# Patient Record
Sex: Male | Born: 1940 | Race: White | Hispanic: No | Marital: Married | Smoking: Never smoker
Health system: Southern US, Community
[De-identification: ages and names within clinical notes are randomized; demographics above are authoritative.]

## PROBLEM LIST (undated history)

## (undated) DIAGNOSIS — K649 Unspecified hemorrhoids: Secondary | ICD-10-CM

## (undated) DIAGNOSIS — A770 Spotted fever due to Rickettsia rickettsii: Secondary | ICD-10-CM

## (undated) DIAGNOSIS — I639 Cerebral infarction, unspecified: Secondary | ICD-10-CM

## (undated) DIAGNOSIS — I10 Essential (primary) hypertension: Secondary | ICD-10-CM

## (undated) DIAGNOSIS — R0602 Shortness of breath: Secondary | ICD-10-CM

## (undated) DIAGNOSIS — E669 Obesity, unspecified: Secondary | ICD-10-CM

## (undated) DIAGNOSIS — I4891 Unspecified atrial fibrillation: Secondary | ICD-10-CM

## (undated) DIAGNOSIS — G4730 Sleep apnea, unspecified: Secondary | ICD-10-CM

## (undated) DIAGNOSIS — K219 Gastro-esophageal reflux disease without esophagitis: Secondary | ICD-10-CM

## (undated) DIAGNOSIS — K8020 Calculus of gallbladder without cholecystitis without obstruction: Secondary | ICD-10-CM

## (undated) DIAGNOSIS — E785 Hyperlipidemia, unspecified: Secondary | ICD-10-CM

## (undated) DIAGNOSIS — F329 Major depressive disorder, single episode, unspecified: Secondary | ICD-10-CM

## (undated) DIAGNOSIS — F32A Depression, unspecified: Secondary | ICD-10-CM

## (undated) HISTORY — DX: Unspecified hemorrhoids: K64.9

## (undated) HISTORY — PX: TONSILLECTOMY: SUR1361

## (undated) HISTORY — DX: Hyperlipidemia, unspecified: E78.5

## (undated) HISTORY — PX: COLONOSCOPY: SHX174

## (undated) HISTORY — PX: KNEE SURGERY: SHX244

---

## 2004-02-28 ENCOUNTER — Inpatient Hospital Stay (HOSPITAL_COMMUNITY): Admission: RE | Admit: 2004-02-28 | Discharge: 2004-03-02 | Payer: Self-pay | Admitting: Orthopedic Surgery

## 2005-05-02 ENCOUNTER — Inpatient Hospital Stay (HOSPITAL_COMMUNITY): Admission: RE | Admit: 2005-05-02 | Discharge: 2005-05-06 | Payer: Self-pay | Admitting: Orthopedic Surgery

## 2006-04-02 DIAGNOSIS — K649 Unspecified hemorrhoids: Secondary | ICD-10-CM

## 2006-04-02 HISTORY — DX: Unspecified hemorrhoids: K64.9

## 2006-11-08 ENCOUNTER — Ambulatory Visit (HOSPITAL_COMMUNITY): Admission: RE | Admit: 2006-11-08 | Discharge: 2006-11-08 | Payer: Self-pay | Admitting: *Deleted

## 2007-03-21 ENCOUNTER — Ambulatory Visit: Payer: Self-pay | Admitting: Vascular Surgery

## 2008-11-03 ENCOUNTER — Encounter: Payer: Self-pay | Admitting: Emergency Medicine

## 2008-11-04 ENCOUNTER — Inpatient Hospital Stay (HOSPITAL_COMMUNITY): Admission: RE | Admit: 2008-11-04 | Discharge: 2008-11-07 | Payer: Self-pay | Admitting: Internal Medicine

## 2008-11-04 ENCOUNTER — Ambulatory Visit: Payer: Self-pay | Admitting: Cardiology

## 2008-11-05 ENCOUNTER — Encounter (HOSPITAL_BASED_OUTPATIENT_CLINIC_OR_DEPARTMENT_OTHER): Payer: Self-pay | Admitting: Internal Medicine

## 2008-11-16 ENCOUNTER — Encounter (HOSPITAL_COMMUNITY): Admission: RE | Admit: 2008-11-16 | Discharge: 2008-12-16 | Payer: Self-pay | Admitting: Surgery

## 2008-12-21 ENCOUNTER — Encounter (HOSPITAL_COMMUNITY): Admission: RE | Admit: 2008-12-21 | Discharge: 2008-12-30 | Payer: Self-pay | Admitting: Surgery

## 2009-01-03 ENCOUNTER — Encounter (HOSPITAL_COMMUNITY): Admission: RE | Admit: 2009-01-03 | Discharge: 2009-02-02 | Payer: Self-pay | Admitting: Surgery

## 2010-04-14 ENCOUNTER — Ambulatory Visit (HOSPITAL_COMMUNITY)
Admission: RE | Admit: 2010-04-14 | Discharge: 2010-04-14 | Payer: Self-pay | Source: Home / Self Care | Attending: Endocrinology | Admitting: Endocrinology

## 2010-04-24 ENCOUNTER — Encounter (HOSPITAL_COMMUNITY)
Admission: RE | Admit: 2010-04-24 | Discharge: 2010-05-02 | Payer: Self-pay | Source: Home / Self Care | Attending: Urology | Admitting: Urology

## 2010-04-27 ENCOUNTER — Ambulatory Visit (HOSPITAL_COMMUNITY)
Admission: RE | Admit: 2010-04-27 | Discharge: 2010-04-27 | Payer: Self-pay | Source: Home / Self Care | Attending: Urology | Admitting: Urology

## 2010-05-04 ENCOUNTER — Encounter (HOSPITAL_COMMUNITY)
Admission: RE | Admit: 2010-05-04 | Discharge: 2010-05-04 | Disposition: A | Discharge status: Home / Self Care | Payer: Medicare Other | Source: Ambulatory Visit | Attending: Urology | Admitting: Urology

## 2010-05-04 DIAGNOSIS — I69922 Dysarthria following unspecified cerebrovascular disease: Secondary | ICD-10-CM | POA: Insufficient documentation

## 2010-05-04 DIAGNOSIS — IMO0001 Reserved for inherently not codable concepts without codable children: Secondary | ICD-10-CM | POA: Insufficient documentation

## 2010-05-09 ENCOUNTER — Ambulatory Visit (HOSPITAL_COMMUNITY)
Admission: RE | Admit: 2010-05-09 | Discharge: 2010-05-09 | Disposition: A | Discharge status: Home / Self Care | Payer: Medicare Other | Source: Ambulatory Visit | Attending: Urology | Admitting: Urology

## 2010-05-09 DIAGNOSIS — I69922 Dysarthria following unspecified cerebrovascular disease: Secondary | ICD-10-CM | POA: Insufficient documentation

## 2010-05-09 DIAGNOSIS — IMO0001 Reserved for inherently not codable concepts without codable children: Secondary | ICD-10-CM | POA: Insufficient documentation

## 2010-05-11 ENCOUNTER — Ambulatory Visit (HOSPITAL_COMMUNITY)
Admission: RE | Admit: 2010-05-11 | Discharge: 2010-05-11 | Disposition: A | Discharge status: Home / Self Care | Payer: Medicare Other | Source: Ambulatory Visit | Attending: Urology | Admitting: Urology

## 2010-05-11 DIAGNOSIS — I69922 Dysarthria following unspecified cerebrovascular disease: Secondary | ICD-10-CM | POA: Insufficient documentation

## 2010-05-11 DIAGNOSIS — IMO0001 Reserved for inherently not codable concepts without codable children: Secondary | ICD-10-CM | POA: Insufficient documentation

## 2010-05-16 ENCOUNTER — Ambulatory Visit (HOSPITAL_COMMUNITY)
Admission: RE | Admit: 2010-05-16 | Discharge: 2010-05-16 | Disposition: A | Discharge status: Home / Self Care | Payer: Medicare Other | Source: Ambulatory Visit | Attending: Urology | Admitting: Urology

## 2010-05-16 DIAGNOSIS — I69922 Dysarthria following unspecified cerebrovascular disease: Secondary | ICD-10-CM | POA: Insufficient documentation

## 2010-05-16 DIAGNOSIS — IMO0001 Reserved for inherently not codable concepts without codable children: Secondary | ICD-10-CM | POA: Insufficient documentation

## 2010-05-18 ENCOUNTER — Ambulatory Visit (HOSPITAL_COMMUNITY)
Admission: RE | Admit: 2010-05-18 | Discharge: 2010-05-18 | Disposition: A | Discharge status: Home / Self Care | Payer: Medicare Other | Source: Ambulatory Visit | Attending: *Deleted | Admitting: *Deleted

## 2010-06-20 ENCOUNTER — Emergency Department (HOSPITAL_COMMUNITY): Payer: Medicare Other

## 2010-06-20 ENCOUNTER — Emergency Department (HOSPITAL_COMMUNITY)
Admission: EM | Admit: 2010-06-20 | Discharge: 2010-06-20 | Disposition: A | Discharge status: Home / Self Care | Payer: Medicare Other | Attending: Emergency Medicine | Admitting: Emergency Medicine

## 2010-06-20 DIAGNOSIS — E7800 Pure hypercholesterolemia, unspecified: Secondary | ICD-10-CM | POA: Insufficient documentation

## 2010-06-20 DIAGNOSIS — E119 Type 2 diabetes mellitus without complications: Secondary | ICD-10-CM | POA: Insufficient documentation

## 2010-06-20 DIAGNOSIS — I10 Essential (primary) hypertension: Secondary | ICD-10-CM | POA: Insufficient documentation

## 2010-06-20 DIAGNOSIS — R5381 Other malaise: Secondary | ICD-10-CM | POA: Insufficient documentation

## 2010-06-20 DIAGNOSIS — E785 Hyperlipidemia, unspecified: Secondary | ICD-10-CM | POA: Insufficient documentation

## 2010-06-20 DIAGNOSIS — H539 Unspecified visual disturbance: Secondary | ICD-10-CM | POA: Insufficient documentation

## 2010-06-20 DIAGNOSIS — R4789 Other speech disturbances: Secondary | ICD-10-CM | POA: Insufficient documentation

## 2010-06-20 DIAGNOSIS — Z79899 Other long term (current) drug therapy: Secondary | ICD-10-CM | POA: Insufficient documentation

## 2010-06-20 DIAGNOSIS — R29898 Other symptoms and signs involving the musculoskeletal system: Secondary | ICD-10-CM | POA: Insufficient documentation

## 2010-06-20 DIAGNOSIS — Z794 Long term (current) use of insulin: Secondary | ICD-10-CM | POA: Insufficient documentation

## 2010-06-20 DIAGNOSIS — Z8673 Personal history of transient ischemic attack (TIA), and cerebral infarction without residual deficits: Secondary | ICD-10-CM | POA: Insufficient documentation

## 2010-06-20 LAB — BASIC METABOLIC PANEL
BUN: 15 mg/dL (ref 6–23)
CO2: 30 mEq/L (ref 19–32)
Calcium: 9.7 mg/dL (ref 8.4–10.5)
Chloride: 107 mEq/L (ref 96–112)
Creatinine, Ser: 1.03 mg/dL (ref 0.4–1.5)
GFR calc Af Amer: >60 mL/min (ref >60)
GFR calc non Af Amer: >60 mL/min (ref >60)
Glucose, Bld: 90 mg/dL (ref 70–99)
Potassium: 4.6 mEq/L (ref 3.5–5.1)
Sodium: 143 mEq/L (ref 135–145)

## 2010-06-20 LAB — DIFFERENTIAL
Basophils Absolute: 0.1 10*3/uL (ref 0.0–0.1)
Basophils Relative: 1 % (ref 0–1)
Eosinophils Absolute: 0.2 10*3/uL (ref 0.0–0.7)
Eosinophils Relative: 2 % (ref 0–5)
Lymphocytes Relative: 28 % (ref 12–46)
Lymphs Abs: 2.2 10*3/uL (ref 0.7–4.0)
Monocytes Absolute: 0.8 10*3/uL (ref 0.1–1.0)
Monocytes Relative: 10 % (ref 3–12)
Neutro Abs: 4.7 10*3/uL (ref 1.7–7.7)
Neutrophils Relative %: 59 % (ref 43–77)

## 2010-06-20 LAB — CBC
HCT: 44.2 % (ref 39.0–52.0)
Hemoglobin: 15.3 g/dL (ref 13.0–17.0)
MCH: 30.5 pg (ref 26.0–34.0)
MCHC: 34.6 g/dL (ref 30.0–36.0)
MCV: 88.2 fL (ref 78.0–100.0)
Platelets: 216 10*3/uL (ref 150–400)
RBC: 5.01 MIL/uL (ref 4.22–5.81)
RDW: 12.3 % (ref 11.5–15.5)
WBC: 7.9 10*3/uL (ref 4.0–10.5)

## 2010-07-08 LAB — GLUCOSE, CAPILLARY
Glucose-Capillary: 101 mg/dL — ABNORMAL HIGH (ref 70–99)
Glucose-Capillary: 108 mg/dL — ABNORMAL HIGH (ref 70–99)
Glucose-Capillary: 116 mg/dL — ABNORMAL HIGH (ref 70–99)
Glucose-Capillary: 123 mg/dL — ABNORMAL HIGH (ref 70–99)
Glucose-Capillary: 124 mg/dL — ABNORMAL HIGH (ref 70–99)
Glucose-Capillary: 132 mg/dL — ABNORMAL HIGH (ref 70–99)
Glucose-Capillary: 159 mg/dL — ABNORMAL HIGH (ref 70–99)
Glucose-Capillary: 164 mg/dL — ABNORMAL HIGH (ref 70–99)
Glucose-Capillary: 185 mg/dL — ABNORMAL HIGH (ref 70–99)
Glucose-Capillary: 192 mg/dL — ABNORMAL HIGH (ref 70–99)
Glucose-Capillary: 215 mg/dL — ABNORMAL HIGH (ref 70–99)
Glucose-Capillary: 223 mg/dL — ABNORMAL HIGH (ref 70–99)
Glucose-Capillary: 230 mg/dL — ABNORMAL HIGH (ref 70–99)
Glucose-Capillary: 328 mg/dL — ABNORMAL HIGH (ref 70–99)
Glucose-Capillary: 379 mg/dL — ABNORMAL HIGH (ref 70–99)
Glucose-Capillary: 56 mg/dL — ABNORMAL LOW (ref 70–99)
Glucose-Capillary: 61 mg/dL — ABNORMAL LOW (ref 70–99)
Glucose-Capillary: 63 mg/dL — ABNORMAL LOW (ref 70–99)

## 2010-07-08 LAB — CBC
HCT: 48.1 % (ref 39.0–52.0)
Hemoglobin: 16.3 g/dL (ref 13.0–17.0)
MCHC: 33.9 g/dL (ref 30.0–36.0)
MCV: 91.2 fL (ref 78.0–100.0)
Platelets: 201 10*3/uL (ref 150–400)
RBC: 5.28 MIL/uL (ref 4.22–5.81)
RDW: 13.4 % (ref 11.5–15.5)
WBC: 7.3 10*3/uL (ref 4.0–10.5)

## 2010-07-08 LAB — BASIC METABOLIC PANEL
BUN: 15 mg/dL (ref 6–23)
BUN: 16 mg/dL (ref 6–23)
CO2: 27 mEq/L (ref 19–32)
CO2: 28 mEq/L (ref 19–32)
Calcium: 9 mg/dL (ref 8.4–10.5)
Calcium: 9.2 mg/dL (ref 8.4–10.5)
Chloride: 105 mEq/L (ref 96–112)
Chloride: 106 mEq/L (ref 96–112)
Creatinine, Ser: 0.92 mg/dL (ref 0.4–1.5)
Creatinine, Ser: 0.96 mg/dL (ref 0.4–1.5)
GFR calc Af Amer: >60 mL/min (ref >60)
GFR calc Af Amer: >60 mL/min (ref >60)
GFR calc non Af Amer: >60 mL/min (ref >60)
GFR calc non Af Amer: >60 mL/min (ref >60)
Glucose, Bld: 166 mg/dL — ABNORMAL HIGH (ref 70–99)
Glucose, Bld: 189 mg/dL — ABNORMAL HIGH (ref 70–99)
Potassium: 3.4 mEq/L — ABNORMAL LOW (ref 3.5–5.1)
Potassium: 4.9 mEq/L (ref 3.5–5.1)
Sodium: 138 mEq/L (ref 135–145)
Sodium: 139 mEq/L (ref 135–145)

## 2010-07-08 LAB — URINALYSIS, ROUTINE W REFLEX MICROSCOPIC
Bilirubin Urine: NEGATIVE
Glucose, UA: >1000 mg/dL — AB
Hgb urine dipstick: NEGATIVE
Ketones, ur: NEGATIVE mg/dL
Leukocytes, UA: NEGATIVE
Nitrite: NEGATIVE
Protein, ur: NEGATIVE mg/dL
Specific Gravity, Urine: 1.01 (ref 1.005–1.030)
Urobilinogen, UA: 0.2 mg/dL (ref 0.0–1.0)
pH: 5.5 (ref 5.0–8.0)

## 2010-07-08 LAB — COMPREHENSIVE METABOLIC PANEL
ALT: 24 U/L (ref 0–53)
AST: 24 U/L (ref 0–37)
Albumin: 4 g/dL (ref 3.5–5.2)
Alkaline Phosphatase: 79 U/L (ref 39–117)
BUN: 21 mg/dL (ref 6–23)
CO2: 29 mEq/L (ref 19–32)
Calcium: 9.8 mg/dL (ref 8.4–10.5)
Chloride: 102 mEq/L (ref 96–112)
Creatinine, Ser: 1.21 mg/dL (ref 0.4–1.5)
GFR calc Af Amer: >60 mL/min (ref >60)
GFR calc non Af Amer: 60 mL/min — ABNORMAL LOW (ref >60)
Glucose, Bld: 416 mg/dL — ABNORMAL HIGH (ref 70–99)
Potassium: 4.3 mEq/L (ref 3.5–5.1)
Sodium: 136 mEq/L (ref 135–145)
Total Bilirubin: 0.5 mg/dL (ref 0.3–1.2)
Total Protein: 6.8 g/dL (ref 6.0–8.3)

## 2010-07-08 LAB — DIFFERENTIAL
Basophils Absolute: 0.1 10*3/uL (ref 0.0–0.1)
Basophils Relative: 1 % (ref 0–1)
Eosinophils Absolute: 0.2 10*3/uL (ref 0.0–0.7)
Eosinophils Relative: 3 % (ref 0–5)
Lymphocytes Relative: 31 % (ref 12–46)
Lymphs Abs: 2.3 10*3/uL (ref 0.7–4.0)
Monocytes Absolute: 0.9 10*3/uL (ref 0.1–1.0)
Monocytes Relative: 12 % (ref 3–12)
Neutro Abs: 3.9 10*3/uL (ref 1.7–7.7)
Neutrophils Relative %: 54 % (ref 43–77)

## 2010-07-08 LAB — POCT CARDIAC MARKERS
CKMB, poc: 1.6 ng/mL (ref 1.0–8.0)
CKMB, poc: 5.4 ng/mL (ref 1.0–8.0)
Myoglobin, poc: 101 ng/mL (ref 12–200)
Myoglobin, poc: 79.5 ng/mL (ref 12–200)
Troponin i, poc: <0.05 ng/mL (ref 0.00–0.09)
Troponin i, poc: <0.05 ng/mL (ref 0.00–0.09)

## 2010-07-08 LAB — URINE MICROSCOPIC-ADD ON

## 2010-07-08 LAB — PROTIME-INR
INR: 0.9 (ref 0.00–1.49)
Prothrombin Time: 11.8 seconds (ref 11.6–15.2)

## 2010-07-08 LAB — TROPONIN I: Troponin I: 0.03 ng/mL (ref 0.00–0.06)

## 2010-07-08 LAB — BRAIN NATRIURETIC PEPTIDE: Pro B Natriuretic peptide (BNP): 83 pg/mL (ref 0.0–100.0)

## 2010-08-15 NOTE — Discharge Summary (Signed)
NAMEONESIMO, LINGARD NO.:  [id number]   MEDICAL RECORD NO.:  [id number]          PATIENT TYPE:  INP   LOCATION:  3032                         FACILITY:  [facility]   PHYSICIAN:  Barry Dienes. Eloise Harman, M.D.DATE OF BIRTH:  October 05, 1940   DATE OF ADMISSION:  11/04/2008  DATE OF DISCHARGE:  11/07/2008                               DISCHARGE SUMMARY   PERTINENT FINDINGS:  The patient is a 70 year old white man who reported  to the emergency room at [facility] with a complaint of  weakness over the past 3 days with intermittent slurred speech.  His  symptoms were worse when he was out in the high heat doing yard work.  His emergency room workup was notable for a blood glucose level  initially greater than 400, so he was started on IV fluids.  At the time  of my initial evaluation, following transfer to [facility], he  felt better and was without shortness of breath, dizziness, or  confusion.  He was sitting up eating breakfast, had normal speech, and  was able to provide a good history.   PAST MEDICAL HISTORY:  Diabetes mellitus type 2, bilateral knee  osteoarthritis, chronic headaches, hyperlipidemia, hypertension, left  leg edema leading to December 2008 DVT ultrasound which was negative,  and exogenous obesity.  He also has a chronic cough.   MEDICATIONS PRIOR TO ADMISSION:  1. Tessalon 200 mg p.o. t.i.d.  2. Coreg 12.5 mg p.o. twice daily.  3. Zocor 80 mg p.o. daily.  4. Zetia 10 mg p.o. daily.  5. Lotensin 40 mg p.o. daily.  6. Diovan HCT 320/25 1 tablet p.o. q.a.m.  7. Trilipix 135 mg p.o. daily.  8. Insulin 75/25 36 units every a.m. and 46 units every p.m.  9. Aspirin 1 tablet daily.   ALLERGIES:  No known drug allergies.   For details of past surgical history, social history, family history,  and review of systems, see admission history and physical.   INITIAL PHYSICAL EXAM:  VITAL SIGNS:  Blood pressure 153/89, pulse 64,  respirations 18,  temperature 96.7, pulse oxygen saturation was 96% on  room air.  Most recent capillary blood glucose levels had been 328, 215,  and 185.  GENERAL:  He is a mildly overweight white man, who is in no apparent  distress.  He was a good historian, with normal speech.  HEAD, EYES,  EARS, NOSE AND THROAT:  Within normal limits.  NECK:  Supple, without jugular venous distention or carotid bruit.  CHEST: Clear to auscultation.  HEART:  Had a regular rate and rhythm without significant murmur.  ABDOMEN:  Had normal bowel sounds and no hepatosplenomegaly or  tenderness.  EXTREMITIES:  Without cyanosis, clubbing, or edema.  NEUROLOGIC:  He was alert and oriented x3, cranial nerves II-XII were  normal, sensory exam was grossly normal, motor strength was 5/5  throughout.  He had bilateral normal finger-to-nose testing.  Gait was  not assessed at that time.   INITIAL LABORATORY DATA:  White blood cell count 7.3, hemoglobin 16,  hematocrit 48, platelets 201,  serum sodium 136, potassium 4.3, chloride  102, carbon dioxide 29, BUN 21, creatinine 1.21, glucose 416, serum  calcium 9.8 with serum albumin 4.0, CK-MB was 5.4, troponin-I level was  less than 0.05.  A CT scan of the head without IV contrast showed no  acute disease.  A chest x-ray showed cardiomegaly with no acute  cardiopulmonary disease.  Urinalysis had specific gravity 1.010, nitrite  negative, and glucose greater than 1000.  EKG showed the following:  1. Normal sinus rhythm.  2. Left axis deviation.  3. Nonspecific ST-segment and T-wave abnormalities.   HOSPITAL COURSE:  The patient was admitted to a medical bed with  telemetry.  Initially had somewhat elevated blood pressures that  improved with adjusting medications.  He had a transthoracic  echocardiogram done that showed normal left ventricular systolic  function with aortic valve sclerosis and mild aortic insufficiency and  trace mitral regurgitation.  There were no vegetations or  sources for  intracardiac emboli.  On August 6, the physical therapist evaluated him  and felt that he had some disequilibrium and left upper strength  weakness.  Later that day he had developed a mild left facial droop with  mild past pointing on the left side.  An EKG at that time continued to  show normal sinus rhythm.  An MRI and MRA exam of the brain was obtained  as well as an MRA of the neck.  This showed a significant focus of  nonhemorrhagic infarct involving the posterior right lentiform nucleus  and posterior limb of the right internal capsule with extension into the  right corona radiata.  There was an additional punctate area of less  intense diffusion abnormality present in the right cerebellum that  likely represented a punctate subacute white matter infarct.  There was  extensive periventricular and subcortical white matter change  bilaterally.  The MRA showed normal flow in the major intracranial  artery.  The internal carotid arteries were within normal limits and  anterior communicating artery was not established.  There was  significant attenuation of MCA branch vessels bilaterally without  proximal occlusion.  The basilar and vertebral arteries showed no  stenosis.  His case was discussed with a neurologist who recommended  switching from aspirin to Plavix and continued efforts at  rehabilitation.   PROCEDURES:  Transthoracic echocardiogram, CT scan of the head without  contrast, and MRI exam of the brain without contrast and MRA exam of the  brain and neck.   COMPLICATIONS:  None.   CONDITION ON DISCHARGE:  He has no headache or nausea.  He has been  eating well without dysphagia.  Most recent physical exam shows vital  signs of blood pressure 166/81, pulse 60, respirations 18, temperature  97.8, pulse oxygen saturation was 95% on room air.  In general, he is a  mildly overweight white man who is in no apparent distress.  He had very  mild slurred speech, but  could easily be understood.  Head, eyes, ears,  nose and throat exam showed a very mild left-sided facial droop.  Chest  was clear to auscultation.  Heart had a regular rate and rhythm.  Neurological exam:  He was alert and well-oriented with a normal affect.  He had a mild left-sided facial droop and 4/5 left upper extremity  strength.  In addition, he had past pointing on the left side.  With  gait assessment, he was able to independently change from a sitting  position to standing position,  walk in the room, and returned to a  seated position.   DISCHARGE DIAGNOSES:  1. Right brain stroke as described above with some left upper      extremity weakness and mild left-sided facial droop.  2. Hypertension, essential, under fair control.  3. Diabetes mellitus, type 2, now under reasonable control.  4. Bilateral knee osteoarthritis leading to bilateral total knee      replacement.  5. Chronic headaches.  6. Chronic cough.  7. Hyperlipidemia.  8. Left leg edema without evidence of deep vein thrombosis.  9. Exogenous obesity.  10.Chronic rhinitis.   DISCHARGE MEDICATIONS:  1. Benzonatate 200 mg p.o. t.i.d. p.r.n. cough.  2. Carvedilol 12.5 mg p.o. twice daily.  3. Zetia 10 mg p.o. daily.  4. Simvastatin 80 mg p.o. daily.  5. Benazepril 40 mg p.o. daily.  6. Diovan HCT 320/25 1 tablet p.o. every a.m.  7. Humalog mix 75/25 insulin, take 20, 5 units every a.m. and 20 units      every p.m.  8. Trilipix 135 mg p.o. daily.  9. Plavix 75 mg p.o. daily.  10.Amlodipine 2.5 mg p.o. daily.  11.Nasal Atrovent 0.03% 1 to 2 sprays each nostril t.i.d. p.r.n.      congestion.   DISPOSITION AND FOLLOWUP:  He was advised to schedule a followup  appointment with Dr. [name] in approximately 1 to 2 weeks  following discharge and was given a telephone number to schedule that  appointment.  He was also advised to schedule outpatient stroke  rehabilitation at the [facility] [place] outpatient  rehab unit and was  given a prescription with orders to that effect.  He was advised that if  he has any difficulties with getting proper rehabilitation after stroke,  that he should call our office.   Please note that the process of discharge required 35 minutes.           ______________________________  Barry Dienes Eloise Harman, M.D.     DGP/MEDQ  D:  11/07/2008  T:  11/08/2008  Job:  [id number]

## 2010-08-15 NOTE — H&P (Signed)
NAMEBERND, CROM NO.:  [id number]   MEDICAL RECORD NO.:  [id number]          PATIENT TYPE:  INP   LOCATION:  3032                         FACILITY:  [facility]   PHYSICIAN:  Barry Dienes. Eloise Harman, M.D.DATE OF BIRTH:  [birth year]   DATE OF ADMISSION:  11/04/2008  DATE OF DISCHARGE:                              HISTORY & PHYSICAL   CHIEF COMPLAINT:  Weakness.   HISTORY OF PRESENT ILLNESS:  The patient is a 70 year old white man who  reported to the emergency room last night with weakness over the past 3  days with intermittent slurred speech.  In the emergency room at [facility], he was felt to be somewhat dehydrated and had moderately  elevated blood glucose level, so he was started on IV fluids.  Now after  IV fluids, he feels better.  He denies shortness of breath or dizziness  or confusion.  At the time of my evaluation he was sitting upright  eating breakfast and was able to provide a good history.   PAST MEDICAL HISTORY:  Diabetes mellitus type 2, which has been under  good control; bilateral knee osteoarthritis; chronic headaches;  hyperlipidemia; hypertension; left leg edema leading to December 2008  DVT ultrasound, which did not show a clot, and exogenous obesity.   MEDICATIONS PRIOR TO ADMISSION:  1. Tessalon 200 mg p.o. t.i.d.  2. Coreg 12.5 mg p.o. b.i.d.  3. Zocor 80 mg p.o. daily.  4. Zetia 10 mg p.o. daily.  5. Lotensin 40 mg p.o. daily.  6. Diovan and HCT 320/25 one tablet p.o. daily.  7. Trilipix 135 mg daily.  8. 75/25 insulin 36 units every a.m. and 46 units every p.m.  9. Aspirin 1 tablet daily.   ALLERGIES:  No known drug allergies.   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   SOCIAL HISTORY:  He is married and has two children.  He works at [employer]  He has  no history of tobacco or alcohol abuse.   FAMILY HISTORY:  His son also has diabetes mellitus.  There is no family  history of colon cancer or premature coronary artery disease.   REVIEW OF SYSTEMS:  He had some dizziness lately with working outside in  the high heat.  He complains of mild orthopnea without PND or chest pain  or pressure.   PHYSICAL EXAMINATION:  VITAL SIGNS:  Blood pressure 153/89, pulse 64,  respirations 18, temperature 96.7, 96% pulse oxygen saturation on room  air.  Capillary blood glucose levels were most recently 328, 215, and  185.  GENERAL:  He is a mildly overweight white man who is in no apparent  distress.  He was a good historian with normal speech.  HEAD, EYES, EARS, NOSE and THROAT:  Within normal limits.  NECK:  Supple and without jugular venous distention or carotid bruit.  CHEST:  Clear to auscultation.  HEART:  Regular rate and rhythm without significant murmur.  ABDOMEN:  Normal bowel sounds and no hepatosplenomegaly or tenderness.  EXTREMITIES:  Without cyanosis, clubbing, or edema.  NEUROLOGIC:  He is alert and oriented x3, cranial nerves II through XII  were normal, sensory exam was grossly normal, motor strength was 5/5  throughout.  He had bilateral normal finger-to-nose testing.  Gait was  not assessed at that time.   INITIAL LABORATORY DATA:  White blood cell count 7.3, hemoglobin 16,  hematocrit 48, platelets 201.  Serum sodium 136, potassium 4.3, chloride  102, carbon dioxide 29, BUN 21, creatinine 1.21, glucose 416, serum  calcium 9.8 with serum albumin 4.0, CK-MB was 5.4.  Troponin I was less  than 0.05.  A CT scan of the head without IV contrast showed no acute  disease.  Chest x-ray showed cardiomegaly with no acute cardiopulmonary  disease.  Urinalysis had glucose greater than 1000 with specific gravity  1.010 and nitrite negative.   EKG showed the following:  1. Normal sinus rhythm.  2. Left axis deviation.  3. Nonspecific  ST-segment and T-wave abnormalities.   IMPRESSION AND PLAN:  1. Altered mental status:  Given his initial hyperglycemia with      symptoms went out and high heat, it is quite likely that these were      due to dehydration.  Transient ischemic attack is also possible,      although currently his neurological exam was normal.  He does not      have a carotid bruit to suggest significant carotid artery disease.      We will give him IV fluids and reevaluate his neurological exam.      We will also request a physical therapy and occupational therapy      evaluation, as well as obtain an echocardiogram.  He will be      continued on aspirin at 325 mg once daily.  If he does show signs      of transient ischemic attacks, we will obtain a MRI scan and MRA,      as well as obtain a Neurology consultation.  2. Diabetes mellitus, type 2:  His initial blood glucose level was      quite high, and he admits that he missed his evening dose of      insulin.  We will start him on lower than his usual dose of 75/25      insulin with sliding scale insulin given only at noon time and      nightly as needed.  3. Hypertension:  His blood pressure systolic was mildly elevated.  If      his blood pressure continues to be significantly elevated, we will      consider adding Norvasc or increasing his dose of Coreg.           ______________________________  Barry Dienes. Eloise Harman, M.D.     DGP/MEDQ  D:  11/05/2008  T:  11/06/2008  Job:  [id number]

## 2010-08-15 NOTE — Op Note (Signed)
[name], [name] NO.:  [id number]   MEDICAL RECORD NO.:  [id number]          PATIENT TYPE:  AMB   LOCATION:  ENDO                         [facility]AC[facility]:  [facility]   PHYSICIAN:  Georgiana Spinner, M.D.    DATE OF BIRTH:  13-Jun-1940   DATE OF PROCEDURE:  11/08/2006  DATE OF DISCHARGE:                               OPERATIVE REPORT   PROCEDURE:  Colonoscopy.   INDICATIONS:  Colon cancer screening, rectal bleeding.   ANESTHESIA:  Fentanyl 70 mcg, Versed 8 mg.   PROCEDURE:  With the patient mildly sedated in the left lateral  decubitus position, a rectal examination was attempted.  The prostate  could not be well felt.  Subsequently, the Pentax videoscopic  colonoscope was inserted into the rectum and passed under direct vision  to the cecum, identified by ileocecal valve and appendiceal orifice,  both of which were photographed.  From this point, the colonoscope was  slowly withdrawn taking circumferential views of colonic mucosa stopping  only in the rectum which appeared normal on direct and showed  hemorrhoids on retroflexed view.  The endoscope was straightened,  withdrawn.  The patient's vital signs and pulse oximeter remained  stable.  The patient tolerated the procedure well without apparent  complications.   FINDINGS:  Internal hemorrhoids.  Otherwise unremarkable exam.   PLAN:  Consider repeat examination in 5-10 years.  At 5 years, would  suggest annual Hemoccults and colonoscopy at that point if they are  positive.           ______________________________  Georgiana Spinner, M.D.     GMO/MEDQ  D:  11/08/2006  T:  11/08/2006  Job:  [id number]   cc:   Jeannett Senior A. Evlyn Kanner, M.D.  Fax: [phone]

## 2010-08-15 NOTE — Procedures (Signed)
DUPLEX DEEP VENOUS EXAM - LOWER EXTREMITY   INDICATION:  Left calf pain and swelling.   HISTORY:  Edema:  Yes.  Trauma/Surgery:  No.  Pain:  Yes.  PE:  No.  Previous DVT:  No.  Anticoagulants:  Other:   DUPLEX EXAM:                CFV   SFV   PopV  PTV    GSV                R  L  R  L  R  L  R   L  R  L  Thrombosis    0  0     0     0      0     0  Spontaneous   +  +     +     +      +     +  Phasic        +  +     +     +      +     +  Augmentation  +  +     +     +      +     +  Compressible  +  +     +     +      +     +  Competent     +  +     +     +      +     +   Legend:  + - yes  o - no  p - partial  D - decreased   IMPRESSION:  No significant of deep venous thrombosis noted in the left  leg.   _____________________________  Quita Skye Hart Rochester, M.D.   MG/MEDQ  D:  03/21/2007  T:  03/23/2007  Job:  [id number]

## 2011-05-14 ENCOUNTER — Encounter (HOSPITAL_COMMUNITY): Payer: Self-pay

## 2011-05-14 ENCOUNTER — Other Ambulatory Visit: Payer: Self-pay

## 2011-05-14 ENCOUNTER — Emergency Department (HOSPITAL_COMMUNITY): Payer: Medicare Other

## 2011-05-14 ENCOUNTER — Inpatient Hospital Stay (HOSPITAL_COMMUNITY)
Admission: EM | Admit: 2011-05-14 | Discharge: 2011-05-16 | DRG: 310 | Disposition: A | Discharge status: Home / Self Care | Payer: Medicare Other | Attending: Internal Medicine | Admitting: Internal Medicine

## 2011-05-14 DIAGNOSIS — E785 Hyperlipidemia, unspecified: Secondary | ICD-10-CM

## 2011-05-14 DIAGNOSIS — I4510 Unspecified right bundle-branch block: Secondary | ICD-10-CM | POA: Diagnosis present

## 2011-05-14 DIAGNOSIS — I4819 Other persistent atrial fibrillation: Secondary | ICD-10-CM | POA: Diagnosis present

## 2011-05-14 DIAGNOSIS — Z79899 Other long term (current) drug therapy: Secondary | ICD-10-CM

## 2011-05-14 DIAGNOSIS — Z8673 Personal history of transient ischemic attack (TIA), and cerebral infarction without residual deficits: Secondary | ICD-10-CM

## 2011-05-14 DIAGNOSIS — I4891 Unspecified atrial fibrillation: Principal | ICD-10-CM

## 2011-05-14 DIAGNOSIS — I4892 Unspecified atrial flutter: Secondary | ICD-10-CM | POA: Diagnosis present

## 2011-05-14 DIAGNOSIS — E669 Obesity, unspecified: Secondary | ICD-10-CM

## 2011-05-14 DIAGNOSIS — I2789 Other specified pulmonary heart diseases: Secondary | ICD-10-CM | POA: Diagnosis present

## 2011-05-14 DIAGNOSIS — I10 Essential (primary) hypertension: Secondary | ICD-10-CM

## 2011-05-14 DIAGNOSIS — E119 Type 2 diabetes mellitus without complications: Secondary | ICD-10-CM

## 2011-05-14 DIAGNOSIS — Z7901 Long term (current) use of anticoagulants: Secondary | ICD-10-CM

## 2011-05-14 DIAGNOSIS — Z794 Long term (current) use of insulin: Secondary | ICD-10-CM

## 2011-05-14 DIAGNOSIS — Z7902 Long term (current) use of antithrombotics/antiplatelets: Secondary | ICD-10-CM

## 2011-05-14 HISTORY — DX: Cerebral infarction, unspecified: I63.9

## 2011-05-14 HISTORY — DX: Essential (primary) hypertension: I10

## 2011-05-14 LAB — CBC
HCT: 46.5 % (ref 39.0–52.0)
Hemoglobin: 15.9 g/dL (ref 13.0–17.0)
MCH: 30.9 pg (ref 26.0–34.0)
MCHC: 34.2 g/dL (ref 30.0–36.0)
MCV: 90.3 fL (ref 78.0–100.0)
Platelets: 215 10*3/uL (ref 150–400)
RBC: 5.15 MIL/uL (ref 4.22–5.81)
RDW: 12.6 % (ref 11.5–15.5)
WBC: 8.4 10*3/uL (ref 4.0–10.5)

## 2011-05-14 LAB — PROTIME-INR
INR: 0.94 (ref 0.00–1.49)
Prothrombin Time: 12.8 seconds (ref 11.6–15.2)

## 2011-05-14 LAB — COMPREHENSIVE METABOLIC PANEL
ALT: 32 U/L (ref 0–53)
AST: 33 U/L (ref 0–37)
Albumin: 4 g/dL (ref 3.5–5.2)
Alkaline Phosphatase: 88 U/L (ref 39–117)
BUN: 18 mg/dL (ref 6–23)
CO2: 26 mEq/L (ref 19–32)
Calcium: 10 mg/dL (ref 8.4–10.5)
Chloride: 103 mEq/L (ref 96–112)
Creatinine, Ser: 0.93 mg/dL (ref 0.50–1.35)
GFR calc Af Amer: >90 mL/min (ref >90)
GFR calc non Af Amer: 84 mL/min — ABNORMAL LOW (ref >90)
Glucose, Bld: 182 mg/dL — ABNORMAL HIGH (ref 70–99)
Potassium: 3.8 mEq/L (ref 3.5–5.1)
Sodium: 139 mEq/L (ref 135–145)
Total Bilirubin: 0.4 mg/dL (ref 0.3–1.2)
Total Protein: 7 g/dL (ref 6.0–8.3)

## 2011-05-14 LAB — DIFFERENTIAL
Basophils Absolute: 0.1 10*3/uL (ref 0.0–0.1)
Basophils Relative: 1 % (ref 0–1)
Eosinophils Absolute: 0.2 10*3/uL (ref 0.0–0.7)
Eosinophils Relative: 3 % (ref 0–5)
Lymphocytes Relative: 28 % (ref 12–46)
Lymphs Abs: 2.4 10*3/uL (ref 0.7–4.0)
Monocytes Absolute: 1 10*3/uL (ref 0.1–1.0)
Monocytes Relative: 12 % (ref 3–12)
Neutro Abs: 4.8 10*3/uL (ref 1.7–7.7)
Neutrophils Relative %: 56 % (ref 43–77)

## 2011-05-14 LAB — TROPONIN I: Troponin I: <0.3 ng/mL (ref <0.30)

## 2011-05-14 LAB — APTT: aPTT: 36 seconds (ref 24–37)

## 2011-05-14 MED ORDER — ENOXAPARIN SODIUM 100 MG/ML ~~LOC~~ SOLN
100.0000 mg | Freq: Once | SUBCUTANEOUS | Status: AC
  Administered 2011-05-14: 100 mg via SUBCUTANEOUS
  Filled 2011-05-14: qty 1

## 2011-05-14 NOTE — H&P (Signed)
PCP:  Julian Hy, MD, MD Chief Complaint:  "I was asked to come to the hospital by my PCP because my heart is said to be irregular"  HPI:  Patient is a 71 year old Caucasian male with history of hypertension, diabetes mellitus, prior CVA with no neuro deficit presenting to the emergency room on the advice of his primary care physician. Patient was visited at home today by the home health care nurse and on general examination was found to have irregular heartbeat and elevated blood pressure. Patient denied any chest pain. He denied any palpitation. No shortness of breath. No systemic symptoms. He was advised to come to the hospital on the advice of his primary care physician for further evaluation  Review of Systems: Denies any complaints.  The patient denies anorexia, fever, weight loss,, vision loss, decreased hearing, hoarseness, chest pain, syncope, dyspnea on exertion, peripheral edema, balance deficits, hemoptysis, abdominal pain, melena, hematochezia, severe indigestion/heartburn, hematuria, incontinence, genital sores, muscle weakness, suspicious skin lesions, transient blindness, difficulty walking, depression, unusual weight change, abnormal bleeding, enlarged lymph nodes, angioedema, and breast masses.  Past Medical History:  Past Medical History  Diagnosis Date  . Hypertension   . Diabetes mellitus   . CVA (cerebral infarction)     Past Surgical History  Procedure Date  . Knee surgery     Medications:  Prior to Admission medications   Medication Sig Start Date End Date Taking? Authorizing Provider  amLODipine (NORVASC) 2.5 MG tablet Take 2.5 mg by mouth daily.   Yes Historical Provider, MD  benazepril (LOTENSIN) 10 MG tablet Take 10 mg by mouth daily.   Yes Historical Provider, MD  carvedilol (COREG) 12.5 MG tablet Take 12.5 mg by mouth 2 (two) times daily with a meal.   Yes Historical Provider, MD  Choline Fenofibrate (TRILIPIX) 135 MG capsule Take 135 mg by  mouth daily.   Yes Historical Provider, MD  clopidogrel (PLAVIX) 75 MG tablet Take 75 mg by mouth daily.   Yes Historical Provider, MD  escitalopram (LEXAPRO) 10 MG tablet Take 10 mg by mouth daily.   Yes Historical Provider, MD  ezetimibe (ZETIA) 10 MG tablet Take 10 mg by mouth daily.   Yes Historical Provider, MD  insulin glargine (LANTUS SOLOSTAR) 100 UNIT/ML injection Inject 32 Units into the skin at bedtime.   Yes Historical Provider, MD  insulin lispro (HUMALOG KWIKPEN) 100 UNIT/ML injection Inject into the skin 3 (three) times daily before meals. **Inject 14 units every morning, 17 units every day at lunch, and 20 units daily with dinner**   Yes Historical Provider, MD  polycarbophil (FIBERCON) 625 MG tablet Take 625 mg by mouth daily.   Yes Historical Provider, MD  simvastatin (ZOCOR) 80 MG tablet Take 80 mg by mouth daily.   Yes Historical Provider, MD  valsartan-hydrochlorothiazide (DIOVAN-HCT) 320-25 MG per tablet Take 1 tablet by mouth daily.   Yes Historical Provider, MD    Allergies:  No Known Allergies  Social History:   reports that he has never smoked. He does not have any smokeless tobacco history on file. He reports that he drinks alcohol. He reports that he does not use illicit drugs.  Family History:  No family history on file.  Physical Exam:  Filed Vitals:   05/14/11 1654 05/14/11 1802  BP: 169/105 157/111  Pulse: 73 84  Temp: 97.6 F (36.4 C)   Resp: 18   Height: 5\' 8"  (1.727 m)   Weight: 104.327 kg (230 lb)   SpO2: 100% 96%  General: Obese, Alert and oriented times three, well developed and nourished, no acute distress  Eyes: PERRLA, pink conjunctiva, scleral icterus  ENT: Moist oral mucosa, neck supple, no thyromegaly  Lungs: clear to ascultation, no wheeze, no crackles, no use of accessory muscles  Cardiovascular: irregular rate and rhythm, no regurgitation, no gallops, no murmurs. No carotid bruits, no JVD  Abdomen: soft, positive BS,  non-tender, non-distended, no organomegaly, not an acute abdomen  GU: not examined  Neuro: No lateralizing signs  Musculoskeletal: strength 5/5 all extremities, no clubbing, cyanosis or edema  Skin: Normal turgor  Psych: appropriate affect  ?  Labs on Admission:  No results found for this basename: NA:2,K:2,CL:2,CO2:2,GLUCOSE:2,BUN:2,CREATININE:2,CALCIUM:2,MG:2,PHOS:2 in the last 72 hours  No results found for this basename: AST:2,ALT:2,ALKPHOS:2,BILITOT:2,PROT:2,ALBUMIN:2 in the last 72 hours  No results found for this basename: LIPASE:2,AMYLASE:2 in the last 72 hours   Basename 05/14/11 1930  WBC 8.4  NEUTROABS 4.8  HGB 15.9  HCT 46.5  MCV 90.3  PLT 215    No results found for this basename: CKTOTAL:3,CKMB:3,CKMBINDEX:3,TROPONINI:3 in the last 72 hours  No results found for this basename: TSH,T4TOTAL,FREET3,T3FREE,THYROIDAB in the last 72 hours  No results found for this basename: VITAMINB12:2,FOLATE:2,FERRITIN:2,TIBC:2,IRON:2,RETICCTPCT:2 in the last 72 hours  Radiological Exams on Admission:  Dg Chest 2 View  05/14/2011  *RADIOLOGY REPORT*  Clinical Data: Atrial fibrillation.  CHEST - 2 VIEW  Comparison: Two-view chest 11/03/2008.  Findings: Mild cardiomegaly is stable.  The lung volumes are low. Bibasilar atelectasis is evident.  The visualized soft tissues and bony thorax are unremarkable.  IMPRESSION:  1.  Stable cardiomegaly without failure. 2.  Low lung volumes and mild bibasilar atelectasis.  Original Report Authenticated By: Jamesetta Orleans. MATTERN, M.D.    Assessment/Plan  Present on Admission:   Problems: #1 asymptomatic atrial fibrillation #2 elevated blood pressure #3 obesity  Impression: #1 asymptomatic atrial fibrillation #2 elevated blood pressure #3 prior CVA-no neuro deficit #4 diabetes mellitus #5 hyperlipidemia  Plan: #1 admit patient to telemetry #2 restart home meds. However the dose of norvasc will be increased to 10 mg daily for  adequate blood pressure control. #3 labs; TSH, T3, T4, magnesium level, CBC CMP  will be repeated in a.m. #4 imaging-2-D echo #5 GI prophylaxis with by mouth Protonix and DVT prophylaxis with Lovenox Patient will be evaluated on daily basis.            Talmage Nap                  [phone]

## 2011-05-14 NOTE — ED Notes (Signed)
Pt had Nurse from [employer] out today to evaluate pt and found to have ? Irregular heart beat. Pt has had episodes of "feeling tired'

## 2011-05-14 NOTE — ED Provider Notes (Signed)
History    This chart was scribed for [employer]. Colon Branch, MD, MD by Smitty Pluck. The patient was seen in room APA08 and the patient's care was started at 6:43PM.   CSN: [id number]  Arrival date & time 05/14/11  1648   First MD Initiated Contact with Patient 05/14/11 1757      Chief Complaint  Patient presents with  . Irregular Heart Beat    (Consider location/radiation/quality/duration/timing/severity/associated sxs/prior treatment) The history is provided by the patient and the spouse.   [name] is a 71 y.o. male who presents to the Emergency Department complaining of irregular heart beat onset today. Nurse from [facility] came out today to evaluate pt and found that he had an irregular heart beat. She called PCP Dr. Evlyn Kanner and the pt was instructed to come to ED. He has never has been diagnosed with this condition before. 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 Pt reports having a diabetes. He reports that his glucoses levels have gone down and his blood pressure has done down resulting in the PCP taking him off of the some of his medications. Pt takes Plavix.  Neurologist is Dr. Hollice Espy in [facility]. Last time he saw him was a month ago and was told he was doing good.   Past Medical History  Diagnosis Date  . Hypertension   . Diabetes mellitus   . CVA (cerebral infarction)     Past Surgical History  Procedure Date  . Knee surgery     No family history on file.  History  Substance Use Topics  . Smoking status: Never Smoker   . Smokeless tobacco: Not on file  . Alcohol Use: Yes      Review of Systems  All other systems reviewed and are negative.   10 Systems reviewed and are negative for acute change except as noted in the HPI.  Allergies  Review of patient's allergies indicates no known allergies.  Home Medications   Current Outpatient Rx    Name Route Sig Dispense Refill  . AMLODIPINE BESYLATE 2.5 MG PO TABS Oral Take 2.5 mg by mouth daily.    Marland Kitchen BENAZEPRIL HCL 10 MG PO TABS Oral Take 10 mg by mouth daily.    Marland Kitchen CARVEDILOL 12.5 MG PO TABS Oral Take 12.5 mg by mouth 2 (two) times daily with a meal.    . CHOLINE FENOFIBRATE 135 MG PO CPDR Oral Take 135 mg by mouth daily.    Marland Kitchen CLOPIDOGREL BISULFATE 75 MG PO TABS Oral Take 75 mg by mouth daily.    Marland Kitchen ESCITALOPRAM OXALATE 10 MG PO TABS Oral Take 10 mg by mouth daily.    Marland Kitchen EZETIMIBE 10 MG PO TABS Oral Take 10 mg by mouth daily.    . INSULIN GLARGINE 100 UNIT/ML [place] SOLN Subcutaneous Inject 32 Units into the skin at bedtime.    . INSULIN LISPRO (HUMAN) 100 UNIT/ML [place] SOLN Subcutaneous Inject into the skin 3 (three) times daily before meals. **Inject 14 units every morning, 17 units every day at lunch, and 20 units daily with dinner**    . CALCIUM POLYCARBOPHIL 625 MG PO TABS Oral Take 625 mg by mouth daily.    Marland Kitchen SIMVASTATIN 80 MG PO TABS Oral Take 80 mg by mouth daily.    Marland Kitchen VALSARTAN-HYDROCHLOROTHIAZIDE 320-25 MG PO TABS Oral Take 1 tablet by mouth daily.  BP 157/111  Pulse 84  Temp 97.6 F (36.4 C)  Resp 18  Ht 5\' 8"  (1.727 m)  Wt 230 lb (104.327 kg)  BMI 34.97 kg/m2  SpO2 96%  Physical Exam  Nursing note and vitals reviewed. Constitutional: He is oriented to person, place, and time. He appears well-developed and well-nourished. No distress.  HENT:  Head: Normocephalic and atraumatic.  Cardiovascular: An irregularly irregular rhythm present.  Pulmonary/Chest: Effort normal and breath sounds normal. No respiratory distress.  Abdominal: Bowel sounds are normal. He exhibits no distension.       obese  Musculoskeletal: He exhibits edema (1 on lower extremities).       Right hand dominant   Neurological: He is alert and oriented to person, place, and time.       Carefully articulated speech due to previous stroke And mild left side weakness Right hand dominant   Skin:  Skin is warm and dry.  Psychiatric: He has a normal mood and affect. His behavior is normal.    ED Course  Procedures (including critical care time) DIAGNOSTIC STUDIES: Oxygen Saturation is 100% on room air, normal by my interpretation.    COORDINATION OF CARE: 6:47PM EDP discusses pt ED treatment course with pt  7:05 PM Spoke with Dr. Beverly Gust, hospitalist. He recommended admission. Will call him back once labs and xray are back. 8:14 Dr. Beverly Gust in the ER to see/admit patient.   Dg Chest 2 View  05/14/2011  *RADIOLOGY REPORT*  Clinical Data: Atrial fibrillation.  CHEST - 2 VIEW  Comparison: Two-view chest 11/03/2008.  Findings: Mild cardiomegaly is stable.  The lung volumes are low. Bibasilar atelectasis is evident.  The visualized soft tissues and bony thorax are unremarkable.  IMPRESSION:  1.  Stable cardiomegaly without failure. 2.  Low lung volumes and mild bibasilar atelectasis.  Original Report Authenticated By: Jamesetta Orleans. MATTERN, M.D.   Results for orders placed during the hospital encounter of 05/14/11  CBC      Component Value Range   WBC 8.4  4.0 - 10.5 (K/uL)   RBC 5.15  4.22 - 5.81 (MIL/uL)   Hemoglobin 15.9  13.0 - 17.0 (g/dL)   HCT 16.1  09.6 - 04.5 (%)   MCV 90.3  78.0 - 100.0 (fL)   MCH 30.9  26.0 - 34.0 (pg)   MCHC 34.2  30.0 - 36.0 (g/dL)   RDW 40.9  81.1 - 91.4 (%)   Platelets 215  150 - 400 (K/uL)  DIFFERENTIAL      Component Value Range   Neutrophils Relative 56  43 - 77 (%)   Neutro Abs 4.8  1.7 - 7.7 (K/uL)   Lymphocytes Relative 28  12 - 46 (%)   Lymphs Abs 2.4  0.7 - 4.0 (K/uL)   Monocytes Relative 12  3 - 12 (%)   Monocytes Absolute 1.0  0.1 - 1.0 (K/uL)   Eosinophils Relative 3  0 - 5 (%)   Eosinophils Absolute 0.2  0.0 - 0.7 (K/uL)   Basophils Relative 1  0 - 1 (%)   Basophils Absolute 0.1  0.0 - 0.1 (K/uL)  COMPREHENSIVE METABOLIC PANEL      Component Value Range   Sodium 139  135 - 145 (mEq/L)   Potassium 3.8  3.5 - 5.1 (mEq/L)    Chloride 103  96 - 112 (mEq/L)   CO2 26  19 - 32 (mEq/L)   Glucose, Bld 182 (*) 70 - 99 (mg/dL)   BUN 18  6 -  23 (mg/dL)   Creatinine, Ser 7.82  0.50 - 1.35 (mg/dL)   Calcium 95.6  8.4 - 10.5 (mg/dL)   Total Protein 7.0  6.0 - 8.3 (g/dL)   Albumin 4.0  3.5 - 5.2 (g/dL)   AST 33  0 - 37 (U/L)   ALT 32  0 - 53 (U/L)   Alkaline Phosphatase 88  39 - 117 (U/L)   Total Bilirubin 0.4  0.3 - 1.2 (mg/dL)   GFR calc non Af Amer 84 (*) >90 (mL/min)   GFR calc Af Amer >90  >90 (mL/min)  TROPONIN I      Component Value Range   Troponin I <0.30  <0.30 (ng/mL)     Date: 05/14/2011  1657  Rate: 105  Rhythm: atrial fibrillation and with a rapid ventricular response  QRS Axis: left  Intervals: atrial fib  ST/T Wave abnormalities: nonspecific T wave changes  Conduction Disutrbances:incomplete RBBB  Narrative Interpretation:   Old EKG Reviewed: changes notedc/w 06/20/10, atrial fibrillation is new, IRBBB is new    MDM  Patient with h/o, htn,  diabetes, stroke x 2 here with new onset atrial fibrillation which is asymptomatic. Discussed with hospitalist. He will admit patient for further work up. Patient has received lovenox. Labs are unremarkable as is the chest xray. EKG with atrial fibrillation. Pt stable in ED with no significant deterioration in condition.The patient appears reasonably stabilized for admission considering the current resources, flow, and capabilities available in the ED at this time, and I doubt any other [facility] requiring further screening and/or treatment in the ED prior to admission.  I personally performed the services described in this documentation, which was scribed in my presence. The recorded information has been reviewed and considered.  MDM Reviewed: nursing note and vitals Interpretation: labs, ECG and x-ray Total time providing critical care: 40. Consults: hospitalist.        Nicoletta Dress. Colon Branch, MD 05/14/11 2056

## 2011-05-15 ENCOUNTER — Encounter (HOSPITAL_COMMUNITY): Payer: Self-pay | Admitting: General Practice

## 2011-05-15 DIAGNOSIS — I10 Essential (primary) hypertension: Secondary | ICD-10-CM | POA: Diagnosis present

## 2011-05-15 DIAGNOSIS — I359 Nonrheumatic aortic valve disorder, unspecified: Secondary | ICD-10-CM

## 2011-05-15 DIAGNOSIS — E785 Hyperlipidemia, unspecified: Secondary | ICD-10-CM | POA: Diagnosis present

## 2011-05-15 DIAGNOSIS — E119 Type 2 diabetes mellitus without complications: Secondary | ICD-10-CM | POA: Diagnosis present

## 2011-05-15 DIAGNOSIS — I4891 Unspecified atrial fibrillation: Principal | ICD-10-CM | POA: Diagnosis present

## 2011-05-15 DIAGNOSIS — Z8673 Personal history of transient ischemic attack (TIA), and cerebral infarction without residual deficits: Secondary | ICD-10-CM

## 2011-05-15 DIAGNOSIS — E669 Obesity, unspecified: Secondary | ICD-10-CM | POA: Diagnosis present

## 2011-05-15 DIAGNOSIS — I4819 Other persistent atrial fibrillation: Secondary | ICD-10-CM | POA: Diagnosis present

## 2011-05-15 LAB — MAGNESIUM: Magnesium: 1.8 mg/dL (ref 1.5–2.5)

## 2011-05-15 LAB — DIFFERENTIAL
Basophils Absolute: 0.1 10*3/uL (ref 0.0–0.1)
Basophils Relative: 1 % (ref 0–1)
Eosinophils Absolute: 0.3 10*3/uL (ref 0.0–0.7)
Eosinophils Relative: 3 % (ref 0–5)
Lymphocytes Relative: 33 % (ref 12–46)
Lymphs Abs: 2.8 10*3/uL (ref 0.7–4.0)
Monocytes Absolute: 1 10*3/uL (ref 0.1–1.0)
Monocytes Relative: 12 % (ref 3–12)
Neutro Abs: 4.3 10*3/uL (ref 1.7–7.7)
Neutrophils Relative %: 51 % (ref 43–77)

## 2011-05-15 LAB — CBC
HCT: 45.5 % (ref 39.0–52.0)
Hemoglobin: 15.3 g/dL (ref 13.0–17.0)
MCH: 29.9 pg (ref 26.0–34.0)
MCHC: 33.6 g/dL (ref 30.0–36.0)
MCV: 89 fL (ref 78.0–100.0)
Platelets: 199 10*3/uL (ref 150–400)
RBC: 5.11 MIL/uL (ref 4.22–5.81)
RDW: 12.5 % (ref 11.5–15.5)
WBC: 8.4 10*3/uL (ref 4.0–10.5)

## 2011-05-15 LAB — GLUCOSE, CAPILLARY
Glucose-Capillary: 262 mg/dL — ABNORMAL HIGH (ref 70–99)
Glucose-Capillary: 149 mg/dL — ABNORMAL HIGH (ref 70–99)
Glucose-Capillary: 124 mg/dL — ABNORMAL HIGH (ref 70–99)

## 2011-05-15 LAB — COMPREHENSIVE METABOLIC PANEL
ALT: 26 U/L (ref 0–53)
AST: 22 U/L (ref 0–37)
Albumin: 3.6 g/dL (ref 3.5–5.2)
Alkaline Phosphatase: 77 U/L (ref 39–117)
BUN: 14 mg/dL (ref 6–23)
CO2: 25 mEq/L (ref 19–32)
Calcium: 9.6 mg/dL (ref 8.4–10.5)
Chloride: 106 mEq/L (ref 96–112)
Creatinine, Ser: 0.89 mg/dL (ref 0.50–1.35)
GFR calc Af Amer: >90 mL/min (ref >90)
GFR calc non Af Amer: 85 mL/min — ABNORMAL LOW (ref >90)
Glucose, Bld: 106 mg/dL — ABNORMAL HIGH (ref 70–99)
Potassium: 3.3 mEq/L — ABNORMAL LOW (ref 3.5–5.1)
Sodium: 142 mEq/L (ref 135–145)
Total Bilirubin: 0.7 mg/dL (ref 0.3–1.2)
Total Protein: 6.3 g/dL (ref 6.0–8.3)

## 2011-05-15 LAB — T4, FREE: Free T4: 1.16 ng/dL (ref 0.80–1.80)

## 2011-05-15 LAB — T3, FREE: T3, Free: 3.1 pg/mL (ref 2.3–4.2)

## 2011-05-15 LAB — TSH: TSH: 1.563 u[IU]/mL (ref 0.350–4.500)

## 2011-05-15 MED ORDER — FENOFIBRATE 54 MG PO TABS
54.0000 mg | ORAL_TABLET | Freq: Every day | ORAL | Status: DC
  Administered 2011-05-15 – 2011-05-16 (×2): 54 mg via ORAL
  Filled 2011-05-15 (×4): qty 1

## 2011-05-15 MED ORDER — POTASSIUM CHLORIDE CRYS ER 20 MEQ PO TBCR
40.0000 meq | EXTENDED_RELEASE_TABLET | Freq: Once | ORAL | Status: AC
  Administered 2011-05-15: 40 meq via ORAL
  Filled 2011-05-15: qty 2

## 2011-05-15 MED ORDER — INSULIN ASPART 100 UNIT/ML ~~LOC~~ SOLN
20.0000 [IU] | Freq: Every day | SUBCUTANEOUS | Status: DC
  Administered 2011-05-15: 20 [IU] via SUBCUTANEOUS

## 2011-05-15 MED ORDER — PANTOPRAZOLE SODIUM 40 MG PO TBEC
40.0000 mg | DELAYED_RELEASE_TABLET | Freq: Every day | ORAL | Status: DC
  Administered 2011-05-15: 40 mg via ORAL
  Filled 2011-05-15: qty 1

## 2011-05-15 MED ORDER — CLOPIDOGREL BISULFATE 75 MG PO TABS
75.0000 mg | ORAL_TABLET | Freq: Every day | ORAL | Status: DC
  Administered 2011-05-15: 75 mg via ORAL
  Filled 2011-05-15: qty 1

## 2011-05-15 MED ORDER — OLMESARTAN MEDOXOMIL 20 MG PO TABS
40.0000 mg | ORAL_TABLET | Freq: Every day | ORAL | Status: DC
  Administered 2011-05-15 – 2011-05-16 (×2): 40 mg via ORAL
  Filled 2011-05-15 (×2): qty 2

## 2011-05-15 MED ORDER — WARFARIN SODIUM 10 MG PO TABS
10.0000 mg | ORAL_TABLET | Freq: Every day | ORAL | Status: DC
  Administered 2011-05-15: 10 mg via ORAL
  Filled 2011-05-15: qty 1

## 2011-05-15 MED ORDER — AMLODIPINE BESYLATE 5 MG PO TABS
2.5000 mg | ORAL_TABLET | Freq: Every day | ORAL | Status: DC
  Administered 2011-05-16: 2.5 mg via ORAL
  Filled 2011-05-15: qty 1

## 2011-05-15 MED ORDER — INSULIN ASPART 100 UNIT/ML ~~LOC~~ SOLN: 32.0000 [IU] | Freq: Every day | SUBCUTANEOUS | Status: DC

## 2011-05-15 MED ORDER — INSULIN ASPART 100 UNIT/ML ~~LOC~~ SOLN
14.0000 [IU] | Freq: Every day | SUBCUTANEOUS | Status: DC
  Administered 2011-05-16: 14 [IU] via SUBCUTANEOUS

## 2011-05-15 MED ORDER — CARVEDILOL 12.5 MG PO TABS
25.0000 mg | ORAL_TABLET | Freq: Two times a day (BID) | ORAL | Status: DC
  Administered 2011-05-15 – 2011-05-16 (×2): 25 mg via ORAL
  Filled 2011-05-15 (×2): qty 2

## 2011-05-15 MED ORDER — ROSUVASTATIN CALCIUM 20 MG PO TABS
20.0000 mg | ORAL_TABLET | Freq: Every day | ORAL | Status: DC
  Administered 2011-05-15 – 2011-05-16 (×2): 20 mg via ORAL
  Filled 2011-05-15 (×2): qty 1

## 2011-05-15 MED ORDER — CALCIUM POLYCARBOPHIL 625 MG PO TABS
625.0000 mg | ORAL_TABLET | Freq: Every day | ORAL | Status: DC
  Administered 2011-05-15 – 2011-05-16 (×2): 625 mg via ORAL
  Filled 2011-05-15 (×4): qty 1

## 2011-05-15 MED ORDER — BENAZEPRIL HCL 10 MG PO TABS
10.0000 mg | ORAL_TABLET | Freq: Every day | ORAL | Status: DC
  Administered 2011-05-15 – 2011-05-16 (×2): 10 mg via ORAL
  Filled 2011-05-15 (×2): qty 1

## 2011-05-15 MED ORDER — WARFARIN VIDEO
Freq: Once | Status: AC
  Administered 2011-05-15: 17:00:00

## 2011-05-15 MED ORDER — AMLODIPINE BESYLATE 5 MG PO TABS
10.0000 mg | ORAL_TABLET | Freq: Every day | ORAL | Status: DC
  Administered 2011-05-15: 10 mg via ORAL
  Filled 2011-05-15: qty 2

## 2011-05-15 MED ORDER — HYDROCHLOROTHIAZIDE 25 MG PO TABS
25.0000 mg | ORAL_TABLET | Freq: Every day | ORAL | Status: DC
  Administered 2011-05-15 – 2011-05-16 (×2): 25 mg via ORAL
  Filled 2011-05-15 (×2): qty 1

## 2011-05-15 MED ORDER — INSULIN ASPART 100 UNIT/ML ~~LOC~~ SOLN: 17.0000 [IU] | Freq: Once | SUBCUTANEOUS | Status: DC

## 2011-05-15 MED ORDER — EZETIMIBE 10 MG PO TABS
10.0000 mg | ORAL_TABLET | Freq: Every day | ORAL | Status: DC
  Administered 2011-05-15 – 2011-05-16 (×2): 10 mg via ORAL
  Filled 2011-05-15 (×2): qty 1

## 2011-05-15 MED ORDER — INSULIN ASPART 100 UNIT/ML ~~LOC~~ SOLN
17.0000 [IU] | Freq: Every day | SUBCUTANEOUS | Status: DC
  Administered 2011-05-15: 17 [IU] via SUBCUTANEOUS
  Filled 2011-05-15: qty 3

## 2011-05-15 MED ORDER — CARVEDILOL 12.5 MG PO TABS
12.5000 mg | ORAL_TABLET | Freq: Two times a day (BID) | ORAL | Status: DC
  Administered 2011-05-15: 12.5 mg via ORAL
  Filled 2011-05-15: qty 1

## 2011-05-15 MED ORDER — ESCITALOPRAM OXALATE 10 MG PO TABS
10.0000 mg | ORAL_TABLET | Freq: Every day | ORAL | Status: DC
  Administered 2011-05-15 – 2011-05-16 (×2): 10 mg via ORAL
  Filled 2011-05-15 (×2): qty 1

## 2011-05-15 MED ORDER — INSULIN GLARGINE 100 UNIT/ML ~~LOC~~ SOLN
32.0000 [IU] | Freq: Every day | SUBCUTANEOUS | Status: DC
  Administered 2011-05-15: 32 [IU] via SUBCUTANEOUS

## 2011-05-15 MED ORDER — ENOXAPARIN SODIUM 40 MG/0.4ML ~~LOC~~ SOLN
40.0000 mg | SUBCUTANEOUS | Status: DC
  Administered 2011-05-15 – 2011-05-16 (×2): 40 mg via SUBCUTANEOUS
  Filled 2011-05-15 (×2): qty 0.4

## 2011-05-15 MED ORDER — VALSARTAN-HYDROCHLOROTHIAZIDE 320-25 MG PO TABS: 1.0000 | ORAL_TABLET | Freq: Every day | ORAL | Status: DC

## 2011-05-15 MED ORDER — COUMADIN BOOK
Freq: Once | Status: AC
  Administered 2011-05-15: 17:00:00
  Filled 2011-05-15: qty 1

## 2011-05-15 NOTE — Progress Notes (Signed)
Subjective: No new complaints. Has no palpitations or chest pain. Has not received his morning or lunch time insulin. His last hemoglobin A1c was about 6. Takes Plavix for previous stroke. Sees a neurologist in [place]. Walks unassisted. He has no history of falls.  Objective: Vital signs in last 24 hours: Filed Vitals:   05/15/11 0450 05/15/11 0811 05/15/11 1116 05/15/11 1118  BP: 162/113 151/112 172/100   Pulse: 84 87  104  Temp: 97.9 F (36.6 C)     TempSrc: Oral     Resp: 20   20  Height:      Weight: 105.325 kg (232 lb 3.2 oz)     SpO2: 93%   96%   Weight change:   Intake/Output Summary (Last 24 hours) at 05/15/11 1328 Last data filed at 05/15/11 0800  Gross per 24 hour  Intake    240 ml  Output    375 ml  Net   -135 ml   Physical Exam: Lungs clear to auscultation bilaterally without wheeze rhonchi or rales Cardiovascular irregularly irregular without murmurs gallops rubs Abdomen soft nontender nondistended Extremities no clubbing cyanosis or edema  Lab Results: Basic Metabolic Panel:  Lab 05/15/11 1610 05/14/11 1930  NA 142 139  K 3.3* 3.8  CL 106 103  CO2 25 26  GLUCOSE 106* 182*  BUN 14 18  CREATININE 0.89 0.93  CALCIUM 9.6 10.0  MG 1.8 --  PHOS -- --   Liver Function Tests:  Lab 05/15/11 0546 05/14/11 1930  AST 22 33  ALT 26 32  ALKPHOS 77 88  BILITOT 0.7 0.4  PROT 6.3 7.0  ALBUMIN 3.6 4.0   No results found for this basename: LIPASE:2,AMYLASE:2 in the last 168 hours No results found for this basename: AMMONIA:2 in the last 168 hours CBC:  Lab 05/15/11 0546 05/14/11 1930  WBC 8.4 8.4  NEUTROABS 4.3 4.8  HGB 15.3 15.9  HCT 45.5 46.5  MCV 89.0 90.3  PLT 199 215   Cardiac Enzymes:  Lab 05/14/11 1930  CKTOTAL --  CKMB --  CKMBINDEX --  TROPONINI <0.30   BNP: No results found for this basename: PROBNP:3 in the last 168 hours D-Dimer: No results found for this basename: DDIMER:2 in the last 168 hours CBG:  Lab 05/15/11 1319    GLUCAP 262*   Hemoglobin A1C: No results found for this basename: HGBA1C in the last 168 hours Fasting Lipid Panel: No results found for this basename: CHOL,HDL,LDLCALC,TRIG,CHOLHDL,LDLDIRECT in the last 960 hours Thyroid Function Tests: No results found for this basename: TSH,T4TOTAL,FREET4,T3FREE,THYROIDAB in the last 168 hours Coagulation:  Lab 05/14/11 1930  LABPROT 12.8  INR 0.94   Anemia Panel: No results found for this basename: VITAMINB12,FOLATE,FERRITIN,TIBC,IRON,RETICCTPCT in the last 168 hours Urine Drug Screen: Drugs of Abuse  No results found for this basename: labopia, cocainscrnur, labbenz, amphetmu, thcu, labbarb    Alcohol Level: No results found for this basename: ETH:2 in the last 168 hours Urinalysis: No results found for this basename: COLORURINE:2,APPERANCEUR:2,LABSPEC:2,PHURINE:2,GLUCOSEU:2,HGBUR:2,BILIRUBINUR:2,KETONESUR:2,PROTEINUR:2,UROBILINOGEN:2,NITRITE:2,LEUKOCYTESUR:2 in the last 168 hours   Micro Results: No results found for this or any previous visit (from the past 240 hour(s)). Studies/Results: Dg Chest 2 View  05/14/2011  *RADIOLOGY REPORT*  Clinical Data: Atrial fibrillation.  CHEST - 2 VIEW  Comparison: Two-view chest 11/03/2008.  Findings: Mild cardiomegaly is stable.  The lung volumes are low. Bibasilar atelectasis is evident.  The visualized soft tissues and bony thorax are unremarkable.  IMPRESSION:  1.  Stable cardiomegaly without failure. 2.  Low lung volumes and mild bibasilar atelectasis.  Original Report Authenticated By: Jamesetta Orleans. MATTERN, M.D.   Scheduled Meds:   . amLODipine  2.5 mg Oral Daily  . benazepril  10 mg Oral Daily  . carvedilol  25 mg Oral BID WC  . coumadin book   Does not apply Once  . enoxaparin  100 mg Subcutaneous Once  . enoxaparin  40 mg Subcutaneous Q24H  . escitalopram  10 mg Oral Daily  . ezetimibe  10 mg Oral Daily  . fenofibrate  54 mg Oral Daily  . hydrochlorothiazide  25 mg Oral Daily  .  insulin aspart  14 Units Subcutaneous Q0600  . insulin aspart  17 Units Subcutaneous Q1200  . insulin aspart  20 Units Subcutaneous Q supper  . insulin aspart  32 Units Subcutaneous QHS  . insulin glargine  32 Units Subcutaneous QHS  . olmesartan  40 mg Oral Daily  . pantoprazole  40 mg Oral Q1200  . polycarbophil  625 mg Oral Daily  . potassium chloride  40 mEq Oral Once  . rosuvastatin  20 mg Oral Daily  . warfarin  10 mg Oral q1800  . warfarin   Does not apply Once  . DISCONTD: amLODipine  10 mg Oral Daily  . DISCONTD: carvedilol  12.5 mg Oral BID WC  . DISCONTD: clopidogrel  75 mg Oral Daily  . DISCONTD: insulin aspart  17 Units Subcutaneous Once  . DISCONTD: valsartan-hydrochlorothiazide  1 tablet Oral Daily   Continuous Infusions:  PRN Meds:. Assessment/Plan: Principal Problem:  *Atrial fibrillation Active Problems:  DM type 2 (diabetes mellitus, type 2)  Benign hypertension  Hyperlipemia  Obesity  H/O: stroke  Patient's age of fibrillation is asymptomatic. His rate is fairly well controlled. Echocardiogram and thyroid function studies are pending. With diabetes, hypertension, advanced age and previous stroke, he would benefit from long-term Coumadin therapy. Patient is agreeable and I will start this while here. Coumadin teaching. Hopefully home tomorrow if workup is unremarkable. His home insulin regimen has been resumed.   LOS: 1 day   , L 05/15/2011, 1:28 PM

## 2011-05-15 NOTE — Plan of Care (Signed)
Problem: Phase I Progression Outcomes Goal: Other Phase I Outcomes/Goals Outcome: Completed/Met Date Met:  05/15/11 Coumadin teaching

## 2011-05-15 NOTE — Progress Notes (Signed)
*  PRELIMINARY RESULTS* Echocardiogram 2D Echocardiogram has been performed.  [name] 05/15/2011, 11:09 AM

## 2011-05-15 NOTE — Progress Notes (Signed)
Glycemic Control Recommendations      Insulin orders clarified by MD.  Patient takes Novolog with each meal and Lantus at bedtime.  Will continue to follow as needed.

## 2011-05-16 LAB — PROTIME-INR
INR: 1.08 (ref 0.00–1.49)
Prothrombin Time: 14.2 seconds (ref 11.6–15.2)

## 2011-05-16 LAB — GLUCOSE, CAPILLARY: Glucose-Capillary: 159 mg/dL — ABNORMAL HIGH (ref 70–99)

## 2011-05-16 MED ORDER — WARFARIN SODIUM 5 MG PO TABS: 5.0000 mg | ORAL_TABLET | Freq: Every day | ORAL | Status: DC

## 2011-05-16 MED ORDER — CARVEDILOL 12.5 MG PO TABS: 25.0000 mg | ORAL_TABLET | Freq: Two times a day (BID) | ORAL | Status: DC

## 2011-05-16 NOTE — Discharge Summary (Signed)
Physician Discharge Summary  Patient ID: TAL KEMPKER MRN: [id number] DOB/AGE: 1940/09/10 71 y.o.  Admit date: 05/14/2011 Discharge date: 05/16/2011  Discharge Diagnoses:  Principal Problem:  *Atrial fibrillation/flutter Active Problems:  DM type 2 (diabetes mellitus, type 2)  Benign hypertension  Hyperlipemia  Obesity  H/O: stroke   Medication List  As of 05/16/2011 10:39 AM   STOP taking these medications         clopidogrel 75 MG tablet         TAKE these medications         amLODipine 2.5 MG tablet   Commonly known as: NORVASC   Take 2.5 mg by mouth daily.      benazepril 10 MG tablet   Commonly known as: LOTENSIN   Take 10 mg by mouth daily.      carvedilol 12.5 MG tablet   Commonly known as: COREG   Take 2 tablets (25 mg total) by mouth 2 (two) times daily with a meal.      escitalopram 10 MG tablet   Commonly known as: LEXAPRO   Take 10 mg by mouth daily.      ezetimibe 10 MG tablet   Commonly known as: ZETIA   Take 10 mg by mouth daily.      HUMALOG KWIKPEN 100 UNIT/ML injection   Generic drug: insulin lispro   Inject into the skin 3 (three) times daily before meals. **Inject 14 units every morning, 17 units every day at lunch, and 20 units daily with dinner**      LANTUS SOLOSTAR 100 UNIT/ML injection   Generic drug: insulin glargine   Inject 32 Units into the skin at bedtime.      polycarbophil 625 MG tablet   Commonly known as: FIBERCON   Take 625 mg by mouth daily.      simvastatin 80 MG tablet   Commonly known as: ZOCOR   Take 80 mg by mouth daily.      TRILIPIX 135 MG capsule   Generic drug: [place] Fenofibrate   Take 135 mg by mouth daily.      valsartan-hydrochlorothiazide 320-25 MG per tablet   Commonly known as: DIOVAN-HCT   Take 1 tablet by mouth daily.      warfarin 5 MG tablet   Commonly known as: COUMADIN   Take 1 tablet (5 mg total) by mouth daily at 6 PM.            Discharge Orders    Future Orders Please Complete  By Expires   Diet - low sodium heart healthy      Diet Carb Modified      Activity as tolerated - No restrictions         Follow-up Information    Follow up with [facility]. Schedule an appointment as soon as possible for a visit in 1 week.         Disposition: Home or Self Care  Discharged Condition:  Stable  Consults:   none Labs:   Results for orders placed during the hospital encounter of 05/14/11 (from the past 48 hour(s))  CBC     Status: Normal   Collection Time   05/14/11  7:30 PM      Component Value Range Comment   WBC 8.4  4.0 - 10.5 (K/uL)    RBC 5.15  4.22 - 5.81 (MIL/uL)    Hemoglobin 15.9  13.0 - 17.0 (g/dL)    HCT 11.9  14.7 - 82.9 (%)  MCV 90.3  78.0 - 100.0 (fL)    MCH 30.9  26.0 - 34.0 (pg)    MCHC 34.2  30.0 - 36.0 (g/dL)    RDW 40.9  81.1 - 91.4 (%)    Platelets 215  150 - 400 (K/uL)   DIFFERENTIAL     Status: Normal   Collection Time   05/14/11  7:30 PM      Component Value Range Comment   Neutrophils Relative 56  43 - 77 (%)    Neutro Abs 4.8  1.7 - 7.7 (K/uL)    Lymphocytes Relative 28  12 - 46 (%)    Lymphs Abs 2.4  0.7 - 4.0 (K/uL)    Monocytes Relative 12  3 - 12 (%)    Monocytes Absolute 1.0  0.1 - 1.0 (K/uL)    Eosinophils Relative 3  0 - 5 (%)    Eosinophils Absolute 0.2  0.0 - 0.7 (K/uL)    Basophils Relative 1  0 - 1 (%)    Basophils Absolute 0.1  0.0 - 0.1 (K/uL)   COMPREHENSIVE METABOLIC PANEL     Status: Abnormal   Collection Time   05/14/11  7:30 PM      Component Value Range Comment   Sodium 139  135 - 145 (mEq/L)    Potassium 3.8  3.5 - 5.1 (mEq/L)    Chloride 103  96 - 112 (mEq/L)    CO2 26  19 - 32 (mEq/L)    Glucose, Bld 182 (*) 70 - 99 (mg/dL)    BUN 18  6 - 23 (mg/dL)    Creatinine, Ser 7.82  0.50 - 1.35 (mg/dL)    Calcium 95.6  8.4 - 10.5 (mg/dL)    Total Protein 7.0  6.0 - 8.3 (g/dL)    Albumin 4.0  3.5 - 5.2 (g/dL)    AST 33  0 - 37 (U/L)    ALT 32  0 - 53 (U/L)    Alkaline Phosphatase  88  39 - 117 (U/L)    Total Bilirubin 0.4  0.3 - 1.2 (mg/dL)    GFR calc non Af Amer 84 (*) >90 (mL/min)    GFR calc Af Amer >90  >90 (mL/min)   PROTIME-INR     Status: Normal   Collection Time   05/14/11  7:30 PM      Component Value Range Comment   Prothrombin Time 12.8  11.6 - 15.2 (seconds)    INR 0.94  0.00 - 1.49    APTT     Status: Normal   Collection Time   05/14/11  7:30 PM      Component Value Range Comment   aPTT 36  24 - 37 (seconds)   TROPONIN I     Status: Normal   Collection Time   05/14/11  7:30 PM      Component Value Range Comment   Troponin I <0.30  <0.30 (ng/mL)   TSH     Status: Normal   Collection Time   05/15/11  5:46 AM      Component Value Range Comment   TSH 1.563  0.350 - 4.500 (uIU/mL)   T3, FREE     Status: Normal   Collection Time   05/15/11  5:46 AM      Component Value Range Comment   T3, Free 3.1  2.3 - 4.2 (pg/mL)   T4, FREE     Status: Normal   Collection Time   05/15/11  5:46 AM  Component Value Range Comment   Free T4 1.16  0.80 - 1.80 (ng/dL)   MAGNESIUM     Status: Normal   Collection Time   05/15/11  5:46 AM      Component Value Range Comment   Magnesium 1.8  1.5 - 2.5 (mg/dL)   CBC     Status: Normal   Collection Time   05/15/11  5:46 AM      Component Value Range Comment   WBC 8.4  4.0 - 10.5 (K/uL)    RBC 5.11  4.22 - 5.81 (MIL/uL)    Hemoglobin 15.3  13.0 - 17.0 (g/dL)    HCT 16.1  09.6 - 04.5 (%)    MCV 89.0  78.0 - 100.0 (fL)    MCH 29.9  26.0 - 34.0 (pg)    MCHC 33.6  30.0 - 36.0 (g/dL)    RDW 40.9  81.1 - 91.4 (%)    Platelets 199  150 - 400 (K/uL)   DIFFERENTIAL     Status: Normal   Collection Time   05/15/11  5:46 AM      Component Value Range Comment   Neutrophils Relative 51  43 - 77 (%)    Neutro Abs 4.3  1.7 - 7.7 (K/uL)    Lymphocytes Relative 33  12 - 46 (%)    Lymphs Abs 2.8  0.7 - 4.0 (K/uL)    Monocytes Relative 12  3 - 12 (%)    Monocytes Absolute 1.0  0.1 - 1.0 (K/uL)    Eosinophils Relative 3  0 -  5 (%)    Eosinophils Absolute 0.3  0.0 - 0.7 (K/uL)    Basophils Relative 1  0 - 1 (%)    Basophils Absolute 0.1  0.0 - 0.1 (K/uL)   COMPREHENSIVE METABOLIC PANEL     Status: Abnormal   Collection Time   05/15/11  5:46 AM      Component Value Range Comment   Sodium 142  135 - 145 (mEq/L)    Potassium 3.3 (*) 3.5 - 5.1 (mEq/L)    Chloride 106  96 - 112 (mEq/L)    CO2 25  19 - 32 (mEq/L)    Glucose, Bld 106 (*) 70 - 99 (mg/dL)    BUN 14  6 - 23 (mg/dL)    Creatinine, Ser 7.82  0.50 - 1.35 (mg/dL)    Calcium 9.6  8.4 - 10.5 (mg/dL)    Total Protein 6.3  6.0 - 8.3 (g/dL)    Albumin 3.6  3.5 - 5.2 (g/dL)    AST 22  0 - 37 (U/L)    ALT 26  0 - 53 (U/L)    Alkaline Phosphatase 77  39 - 117 (U/L)    Total Bilirubin 0.7  0.3 - 1.2 (mg/dL)    GFR calc non Af Amer 85 (*) >90 (mL/min)    GFR calc Af Amer >90  >90 (mL/min)   GLUCOSE, CAPILLARY     Status: Abnormal   Collection Time   05/15/11  1:19 PM      Component Value Range Comment   Glucose-Capillary 262 (*) 70 - 99 (mg/dL)   GLUCOSE, CAPILLARY     Status: Abnormal   Collection Time   05/15/11  5:18 PM      Component Value Range Comment   Glucose-Capillary 149 (*) 70 - 99 (mg/dL)   GLUCOSE, CAPILLARY     Status: Abnormal   Collection Time   05/15/11  9:06 PM  Component Value Range Comment   Glucose-Capillary 124 (*) 70 - 99 (mg/dL)    Comment 1 Documented in Chart      Comment 2 Notify RN     PROTIME-INR     Status: Normal   Collection Time   05/16/11  5:16 AM      Component Value Range Comment   Prothrombin Time 14.2  11.6 - 15.2 (seconds)    INR 1.08  0.00 - 1.49    GLUCOSE, CAPILLARY     Status: Abnormal   Collection Time   05/16/11  7:25 AM      Component Value Range Comment   Glucose-Capillary 159 (*) 70 - 99 (mg/dL)    Comment 1 Notify RN      Comment 2 Documented in Chart       Diagnostics:  Dg Chest 2 View  05/14/2011  *RADIOLOGY REPORT*  Clinical Data: Atrial fibrillation.  CHEST - 2 VIEW  Comparison: Two-view  chest 11/03/2008.  Findings: Mild cardiomegaly is stable.  The lung volumes are low. Bibasilar atelectasis is evident.  The visualized soft tissues and bony thorax are unremarkable.  IMPRESSION:  1.  Stable cardiomegaly without failure. 2.  Low lung volumes and mild bibasilar atelectasis.  Original Report Authenticated By: Jamesetta Orleans. MATTERN, M.D.    Procedures: None  EKG: Atrial flutter. Incomplete right branch block.  Full Code   Hospital Course:  See H&P for complete admission details. The patient is a pleasant 71 year old white male with previous history of stroke who was being evaluated by a nurse at home and noted to have an irregular heart rhythm. His primary care provider instructed him to come to the emergency room, where he was found to be in H. fibrillation/flutter. His heart rate was not tachycardic. He has no previous history of dysrhythmias. He's has had several strokes and was on Plavix prior to admission. He has no previous history of heart disease. He reports having had an echocardiogram as an outpatient for years ago and there were no reported problems with the test. He had no shortness of breath or chest pain. He had no palpitations and felt fine.  In the emergency room, he was noted to be hypertensive with a blood pressure 169/105. His heart rate was 73 and he had otherwise unremarkable vital signs. He reports that his primary care physician recently cut the dose of his carvedilol in half because of borderline low blood pressure. On physical examination, he had slight dysarthria, and irregularly irregular heart rhythm without tachycardia. no murmurs gallops rubs. Slight old left-sided weakness. He was admitted to the hospitalist service for workup and started on Coumadin therapy. Echocardiogram shows dilated atria and mild pulmonary hypertension. Ejection fraction is adequate. Thyroid function tests are normal. Labs all okay. He was given Coumadin teaching and will followup with  cardiology as an outpatient for any further workup and continued monitoring. He also will be set up with the Coumadin clinic next week. I've instructed him to increase his carvedilol back up to 25 mg twice a day, as his blood pressure was elevated during the hospitalization as well. Other medical problems remain stable. He can stop his Plavix as he is now on Coumadin. He reports that he was put on Plavix for stroke prophylaxis. Total time on the day of discharge greater than 30 minutes.  Discharge Exam:  Blood pressure 156/83, pulse 78, temperature 97.7 F (36.5 C), temperature source Oral, resp. rate 20, height 5\' 8"  (1.727 m), weight 105.325 kg (  232 lb 3.2 oz), SpO2 95.00%.  Unchanged from 05/15/2011   Signed: Crista Curb L 05/16/2011, 10:39 AM

## 2011-05-16 NOTE — Progress Notes (Signed)
Discharged home. To follow up with cardiology in one week

## 2011-05-22 NOTE — Progress Notes (Signed)
UR Chart Review Completed  

## 2011-06-13 ENCOUNTER — Other Ambulatory Visit (HOSPITAL_COMMUNITY): Payer: Self-pay | Admitting: Internal Medicine

## 2011-07-09 ENCOUNTER — Other Ambulatory Visit: Payer: Self-pay | Admitting: Internal Medicine

## 2011-07-12 ENCOUNTER — Encounter (HOSPITAL_COMMUNITY): Payer: Self-pay | Admitting: Internal Medicine

## 2011-07-12 ENCOUNTER — Ambulatory Visit (HOSPITAL_COMMUNITY)
Admission: RE | Admit: 2011-07-12 | Discharge: 2011-07-12 | Disposition: A | Discharge status: Home / Self Care | Payer: Medicare Other | Source: Ambulatory Visit | Attending: Internal Medicine | Admitting: Internal Medicine

## 2011-07-12 ENCOUNTER — Encounter (HOSPITAL_COMMUNITY): Payer: Self-pay | Admitting: Anesthesiology

## 2011-07-12 ENCOUNTER — Encounter (HOSPITAL_COMMUNITY)
Admission: RE | Disposition: A | Discharge status: Home / Self Care | Payer: Self-pay | Source: Ambulatory Visit | Attending: Internal Medicine

## 2011-07-12 ENCOUNTER — Ambulatory Visit (HOSPITAL_COMMUNITY): Payer: Medicare Other | Admitting: Anesthesiology

## 2011-07-12 DIAGNOSIS — I4891 Unspecified atrial fibrillation: Secondary | ICD-10-CM | POA: Insufficient documentation

## 2011-07-12 HISTORY — PX: CARDIOVERSION: SHX1299

## 2011-07-12 HISTORY — DX: Shortness of breath: R06.02

## 2011-07-12 HISTORY — DX: Sleep apnea, unspecified: G47.30

## 2011-07-12 HISTORY — DX: Depression, unspecified: F32.A

## 2011-07-12 HISTORY — PX: TEE WITHOUT CARDIOVERSION: SHX5443

## 2011-07-12 HISTORY — DX: Cerebral infarction, unspecified: I63.9

## 2011-07-12 HISTORY — DX: Major depressive disorder, single episode, unspecified: F32.9

## 2011-07-12 LAB — GLUCOSE, CAPILLARY: Glucose-Capillary: 120 mg/dL — ABNORMAL HIGH (ref 70–99)

## 2011-07-12 SURGERY — ECHOCARDIOGRAM, TRANSESOPHAGEAL
Anesthesia: Moderate Sedation

## 2011-07-12 SURGERY — CARDIOVERSION
Anesthesia: Monitor Anesthesia Care

## 2011-07-12 MED ORDER — FENTANYL CITRATE 0.05 MG/ML IJ SOLN
INTRAMUSCULAR | Status: AC
  Filled 2011-07-12: qty 2

## 2011-07-12 MED ORDER — MIDAZOLAM HCL 10 MG/2ML IJ SOLN
INTRAMUSCULAR | Status: DC | PRN
  Administered 2011-07-12: 2 mg via INTRAVENOUS
  Administered 2011-07-12 (×4): 1 mg via INTRAVENOUS

## 2011-07-12 MED ORDER — SODIUM CHLORIDE 0.9 % IV SOLN
INTRAVENOUS | Status: DC
  Administered 2011-07-12: 500 mL via INTRAVENOUS

## 2011-07-12 MED ORDER — SODIUM CHLORIDE 0.9 % IV SOLN
INTRAVENOUS | Status: DC | PRN
  Administered 2011-07-12: 14:00:00 via INTRAVENOUS

## 2011-07-12 MED ORDER — SUCCINYLCHOLINE CHLORIDE 20 MG/ML IJ SOLN
INTRAMUSCULAR | Status: DC | PRN
  Administered 2011-07-12: 200 mg via INTRAVENOUS

## 2011-07-12 MED ORDER — SODIUM CHLORIDE 0.9 % IJ SOLN: 3.0000 mL | Freq: Two times a day (BID) | INTRAMUSCULAR | Status: DC

## 2011-07-12 MED ORDER — LIDOCAINE VISCOUS 2 % MT SOLN
OROMUCOSAL | Status: AC
  Filled 2011-07-12: qty 15

## 2011-07-12 MED ORDER — BENZOCAINE 20 % MT SOLN: 1.0000 "application " | OROMUCOSAL | Status: DC | PRN

## 2011-07-12 MED ORDER — FENTANYL CITRATE 0.05 MG/ML IJ SOLN: 250.0000 ug | Freq: Once | INTRAMUSCULAR | Status: DC

## 2011-07-12 MED ORDER — SODIUM CHLORIDE 0.45 % IV SOLN: INTRAVENOUS | Status: DC

## 2011-07-12 MED ORDER — HEPARIN SODIUM (PORCINE) 5000 UNIT/ML IJ SOLN
5000.0000 [IU] | INTRAMUSCULAR | Status: AC
  Administered 2011-07-12: 5000 [IU] via INTRAVENOUS
  Filled 2011-07-12: qty 1

## 2011-07-12 MED ORDER — FENTANYL CITRATE 0.05 MG/ML IJ SOLN
INTRAMUSCULAR | Status: DC | PRN
  Administered 2011-07-12 (×2): 25 ug via INTRAVENOUS

## 2011-07-12 MED ORDER — HYDROCORTISONE 1 % EX CREA
1.0000 "application " | TOPICAL_CREAM | Freq: Three times a day (TID) | CUTANEOUS | Status: DC | PRN
  Filled 2011-07-12: qty 28

## 2011-07-12 MED ORDER — HEPARIN BOLUS VIA INFUSION
5000.0000 [IU] | INTRAVENOUS | Status: DC
  Filled 2011-07-12: qty 5000

## 2011-07-12 MED ORDER — MIDAZOLAM HCL 10 MG/2ML IJ SOLN: 10.0000 mg | Freq: Once | INTRAMUSCULAR | Status: DC

## 2011-07-12 MED ORDER — DABIGATRAN ETEXILATE MESYLATE 150 MG PO CAPS
150.0000 mg | ORAL_CAPSULE | Freq: Two times a day (BID) | ORAL | Status: DC
  Filled 2011-07-12 (×3): qty 1

## 2011-07-12 MED ORDER — BUTAMBEN-TETRACAINE-BENZOCAINE 2-2-14 % EX AERO
INHALATION_SPRAY | CUTANEOUS | Status: DC | PRN
  Administered 2011-07-12: 2 via TOPICAL

## 2011-07-12 MED ORDER — PROPOFOL 10 MG/ML IV BOLUS
INTRAVENOUS | Status: DC | PRN
  Administered 2011-07-12: 130 mg via INTRAVENOUS
  Administered 2011-07-12: 40 mg via INTRAVENOUS
  Administered 2011-07-12: 70 mg via INTRAVENOUS

## 2011-07-12 MED ORDER — DABIGATRAN ETEXILATE MESYLATE 150 MG PO CAPS: 150.0000 mg | ORAL_CAPSULE | Freq: Two times a day (BID) | ORAL | Status: DC

## 2011-07-12 MED ORDER — SODIUM CHLORIDE 0.9 % IJ SOLN: 3.0000 mL | INTRAMUSCULAR | Status: DC | PRN

## 2011-07-12 MED ORDER — MIDAZOLAM HCL 10 MG/2ML IJ SOLN
INTRAMUSCULAR | Status: AC
  Filled 2011-07-12: qty 2

## 2011-07-12 MED ORDER — SODIUM CHLORIDE 0.9 % IV SOLN: 250.0000 mL | INTRAVENOUS | Status: DC | PRN

## 2011-07-12 MED ORDER — LIDOCAINE VISCOUS 2 % MT SOLN
OROMUCOSAL | Status: DC | PRN
  Administered 2011-07-12: 10 mL via OROMUCOSAL

## 2011-07-12 NOTE — H&P (Addendum)
     [facility]          INTERVAL PROCEDURE H&P   History and Physical Interval Note:  07/12/2011 11:50 AM  [name] has presented today for their planned procedure. The various methods of treatment have been discussed with the patient and family. After consideration of risks, benefits and other options for treatment, the patient has consented to the procedure.  The patients' outpatient history has been reviewed, patient examined, and no change in status from most recent office note within the past 30 days. I have reviewed the patients' chart and labs and will proceed as planned. Questions were answered to the patient's satisfaction.   [name], MD, [facility] Attending Cardiologist [facility]  , C 07/12/2011, 11:50 AM   Addendum:  INR 2 days ago was 1.1, despite increase in warfarin.  Given the increased thrombotic risk after cardioversion, I'm electing to switch anticoagulants on him to pradaxa 150 BID.  [name], MD, [facility] Attending Cardiologist [facility]

## 2011-07-12 NOTE — Anesthesia Postprocedure Evaluation (Signed)
  Anesthesia Post-op Note  Patient: [name]  Procedure(s) Performed: Procedure(s) (LRB): TRANSESOPHAGEAL ECHOCARDIOGRAM (TEE) (N/A) CARDIOVERSION (N/A)  Patient Location: PACU  Anesthesia Type: General  Level of Consciousness: awake and sedated  Airway and Oxygen Therapy: Patient Spontanous Breathing  Post-op Pain: none  Post-op Assessment: Post-op Vital signs reviewed  Post-op Vital Signs: Reviewed  Complications: No apparent anesthesia complications

## 2011-07-12 NOTE — Anesthesia Postprocedure Evaluation (Signed)
  Anesthesia Post-op Note  Patient: [name]  Procedure(s) Performed: Procedure(s) (LRB): TRANSESOPHAGEAL ECHOCARDIOGRAM (TEE) (N/A) CARDIOVERSION (N/A)  Patient Location: PACU  Anesthesia Type: General  Level of Consciousness: awake  Airway and Oxygen Therapy: Patient Spontanous Breathing and Patient connected to nasal cannula oxygen  Post-op Pain: none  Post-op Assessment: Post-op Vital signs reviewed, Patient's Cardiovascular Status Stable, Respiratory Function Stable and Patent Airway  Post-op Vital Signs: Reviewed and stable  Complications: No apparent anesthesia complications

## 2011-07-12 NOTE — Progress Notes (Signed)
  Echocardiogram Echocardiogram Transesophageal has been performed.  JOHNSON,  A 07/12/2011, 3:35 PM 

## 2011-07-12 NOTE — Op Note (Signed)
[facility]  TEE/CARDIOVERSION NOTE   TRANSESOPHAGEAL ECHOCARDIOGRAM (TEE):  Indictation: Atrial Fibrillation and Stroke  Consent:   Informed consent was obtained prior to the procedure. The risks, benefits and alternatives for the procedure were discussed and the patient comprehended these risks.  Risks include, but are not limited to, cough, sore throat, vomiting, nausea, somnolence, esophageal and stomach trauma or perforation, bleeding, low blood pressure, aspiration, pneumonia, infection, trauma to the teeth and death.    Time Out: Verified patient identification, verified procedure, site/side was marked, verified correct patient position, special equipment/implants available, medications/allergies/relevent history reviewed, required imaging and test results available. Performed  Procedure:  After a procedural time-out, the patient was given 6 mg versed and 50 mcg fentanyl for moderate sedation.  The oropharynx was anesthetized 20 cc of topical 1% viscous lidocaine and one spray of cetacaine.  The transesophageal probe was not able to be inserted into the esophagus due to significant neck thickness and coiling in the piriform sinus.  Due to the patient's history of obstructive sleep apnea, it was decided to have anesthesia electively intubate the patient to allow placement of the TEE probe and for airway protection.  The patient was paralyzed and sedated per anesthesia and the TEE probe was then inserted successfully, again with some difficulty. Multiple ultrasound images were obtained.  Once the atrial appendage was cleared by ultrasound, it was determined to that he would undergo cardioversion.  After successful cardioversion, the patient was transferred to the PACU for hemodynamic monitoring and elective extubation per anesthesia.  Agitated microbubble saline contrast was not administered.  Complications:    Complications: None Patient did tolerate  procedure well.  Findings:  1. LEFT VENTRICLE: The left ventricle is normal in structure and function without any thrombus or masses. LVEF is 55%.  2. RIGHT VENTRICLE:  The right ventricle is normal in structure and function without any thrombus or masses.    3. LEFT ATRIUM:  The left atrium is dilated with echo smoke present. There are no thrombi or masses.  4. LEFT ATRIAL APPENDAGE:  The left atrial appendage is free of any thrombus or masses.   5. ATRIAL SEPTUM:  The atrial septum is free of any thrombus or masses.  There is no evidence for interatrial shunting by color doppler. Saline microbubble contrast was not performed.  6. RIGHT ATRIUM:  The right atrium is normal in size and function without any thrombus or masses.  7. MITRAL VALVE:  The mitral valve is normal in structure and function without trace central regurgitation.  There were no vegetations or stenosis.  8. AORTIC VALVE:  The aortic valve is sclerotic and function without regurgitation.  There were no vegetations or stenosis  9. TRICUSPID VALVE:  The tricuspid valve is normal in structure and function with trace regurgitation.  There were no vegetations or stenosis  10.  PULMONIC VALVE:  The tricuspid valve is normal in structure and function without regurgitation.  There were no vegetations or stenosis.   11. AORTIC ARCH, ASCENDING AND DESCENDING AORTA:  There was no atherosclerosis of the descending or ascending aorta  CARDIOVERSION:     Time Out: Verified patient identification, verified procedure, site/side was marked, verified correct patient position, special equipment/implants available, medications/allergies/relevent history reviewed, required imaging and test results available.  Performed  Procedure:  1. Patient placed on cardiac monitor, pulse oximetry, supplemental oxygen as necessary.  2. Sedation administered per anesthesia 3. Pacer pads placed anterior and posterior chest. 4. Cardioverted  1 time(s).    5. Cardioverted at 150J biphasic.  Complications:  Complications: None Patient did tolerate procedure well.  Impression:  1. Successful cardioversion to sinus rhythm with a single biphasic shock at 150J.   2. Procedure was facilitated by definitive airway control (intubation) by anesthesia.   Recommendations:  1.   Given his low INR, will administer 5000 IU heparin IV bolus. 2.   Give 150 mg po Pradaxa prior to discharge. I have given him an Rx for Pradaxa 150 mg BID to start tomorrow morning. 3.   Follow-up will be arranged in our office.  Time Spent Directly with the Patient:  120 minutes   [name], MD, [facility] Attending Cardiologist [facility]  07/12/2011, 1:46 PM

## 2011-07-12 NOTE — Transfer of Care (Signed)
Immediate Anesthesia Transfer of Care Note  Patient: [name]  Procedure(s) Performed: Procedure(s) (LRB): TRANSESOPHAGEAL ECHOCARDIOGRAM (TEE) (N/A) CARDIOVERSION (N/A)  Patient Location: PACU  Anesthesia Type: General  Level of Consciousness: awake and sedated  Airway & Oxygen Therapy: Patient Spontanous Breathing and Patient connected to nasal cannula oxygen  Post-op Assessment: Report given to PACU RN and Post -op Vital signs reviewed and stable  Post vital signs: Reviewed  Complications: No apparent anesthesia complications

## 2011-07-12 NOTE — Preoperative (Signed)
Beta Blockers   Reason not to administer Beta Blockers:Not Applicable 

## 2011-07-12 NOTE — Anesthesia Preprocedure Evaluation (Addendum)
Anesthesia Evaluation  Patient identified by MRN, date of birth, ID band Patient awake    Reviewed: Allergy & Precautions, H&P , NPO status , Patient's Chart, lab work & pertinent test results  Airway       Dental   Pulmonary shortness of breath, sleep apnea and Continuous Positive Airway Pressure Ventilation ,          Cardiovascular hypertension, Pt. on home beta blockers + dysrhythmias Atrial Fibrillation     Neuro/Psych Depression CVA    GI/Hepatic   Endo/Other  Diabetes mellitus-, Type 1, Insulin Dependent  Renal/GU      Musculoskeletal   Abdominal   Peds  Hematology   Anesthesia Other Findings   Reproductive/Obstetrics                          Anesthesia Physical Anesthesia Plan  ASA: III  Anesthesia Plan: General   Post-op Pain Management:    Induction: Intravenous  Airway Management Planned: Oral ETT and Video Laryngoscope Planned  Additional Equipment:   Intra-op Plan:   Post-operative Plan: Extubation in OR  Informed Consent: I have reviewed the patients History and Physical, chart, labs and discussed the procedure including the risks, benefits and alternatives for the proposed anesthesia with the patient or authorized representative who has indicated his/her understanding and acceptance.     Plan Discussed with: CRNA and Surgeon  Anesthesia Plan Comments:         Anesthesia Quick Evaluation

## 2011-07-12 NOTE — OR Nursing (Signed)
Patient had to be given general anesthesia with diprivan and succinylcholine for the TEE.  Patient was intubated by Dr. Sampson Goon and Dr Rennis Golden completed the TEE cardioversion with 120 Joules biphasic to NSR.

## 2011-07-12 NOTE — Discharge Instructions (Signed)
Transesophageal Echocardiography A transesophageal echocardiogram (TEE) is a special type of test that produces images of the heart by sound waves (echocardiogram). This type of echocardiogram can obtain better images of the heart than a standard echocardiogram. A TEE is done by passing a flexible tube down the esophagus. The heart is located in front of the esophagus. Because the heart and esophagus are close to one another, your caregiver can take very clear, detailed pictures of the heart via ultrasound waves. WHY HAVE A TEE? Your caregiver may need more information based on your medical condition. A TEE is usually performed due to the following:  Your caregiver needs more information based on standard echocardiogram findings.   If you had a stroke, this might have happened because a clot formed in your heart. A TEE can visualize different areas of the heart and check for clots.   To check valve anatomy and function. Your caregiver will especially look at the mitral valve.   To check for redness, soreness, and swelling (inflammation) on the inside lining of the heart (endocarditis).   To evaluate the dividing wall (septum) of the heart and presence of a hole that did not close after birth (patent foramen ovale, PFO).   To help diagnose a tear in the wall of the aorta (aortic dissection).   During cardiac valve surgery, a TEE probe is placed. This allows the surgeon to assess the valve repair before closing the chest.  LET YOUR CAREGIVER KNOW ABOUT:   Swallowing difficulties.   An esophageal obstruction.   Use of aspirin or antiplatelet therapy.  RISKS AND COMPLICATIONS  Though extremely rare, an esophageal tear (rupture) is a potential complication. BEFORE THE PROCEDURE   Arrive at least 1 hour before the procedure or as told by your caregiver.   Do not eat or drink for 6 hours before the procedure or as told by your caregiver.   An intravenous (IV) access tube will be started in  the arm.  PROCEDURE   A medicine to help you relax (sedative) will be given through the IV.   A medicine that numbs the area (local anesthetic) may be sprayed to the back of the throat.   Your blood pressure, heart rate, and breathing (vital signs) will be monitored during the procedure.   The TEE probe is a long, flexible tube. It is about the width of an adult male's index finger. The tip of the probe is placed into the back of the mouth and you will be asked to swallow. This helps to pass the tip of the probe into the esophagus. Once the tip of the probe is in the correct area, your caregiver can take pictures of the heart.   A TEE is usually not a painful procedure. You may feel the probe press against the back of the throat. The probe does not enter the trachea and does not affect your breathing.   Your time spent at the hospital is usually less than 2 hours.  AFTER THE PROCEDURE   You will be in bed, resting until you have fully returned to consciousness.   When you first awaken, your throat may feel slightly sore and will probably still feel numb. This will improve slowly over time.   You will not be allowed to eat or drink until it is clear that numbness has improved.   Once you have been able to drink, urinate, and sit on the edge of the bed without feeling sick to your stomach (nauseous)   or dizzy, you may be cleared to dress and go home.   Do not drive yourself home. You have had medications that can continue to make you feel drowsy and can impair your reflexes.   You should have a friend or family member with you for the next 24 hours after your examination.  Obtaining the test results It is your responsibility to obtain your test results. Ask the lab or department performing the test when and how you will get your results. SEEK IMMEDIATE MEDICAL CARE IF:   There is chest pain.   You have a hard time breathing or have shortness of breath.   You cough or throw up (vomit)  blood.  MAKE SURE YOU:   Understand these instructions.   Will watch this condition.   Will get help right away if you is not doing well or gets worse.  Document Released: 06/09/2002 Document Revised: 03/08/2011 Document Reviewed: 08/31/2008 [employer] Patient Information 2012 [employer]. 

## 2011-07-13 ENCOUNTER — Encounter (HOSPITAL_COMMUNITY): Payer: Self-pay | Admitting: Internal Medicine

## 2011-10-01 DIAGNOSIS — K8020 Calculus of gallbladder without cholecystitis without obstruction: Secondary | ICD-10-CM

## 2011-10-01 HISTORY — DX: Calculus of gallbladder without cholecystitis without obstruction: K80.20

## 2011-10-26 ENCOUNTER — Encounter (HOSPITAL_COMMUNITY): Payer: Self-pay | Admitting: *Deleted

## 2011-10-26 ENCOUNTER — Emergency Department (HOSPITAL_COMMUNITY): Payer: Medicare Other

## 2011-10-26 ENCOUNTER — Inpatient Hospital Stay (HOSPITAL_COMMUNITY)
Admission: EM | Admit: 2011-10-26 | Discharge: 2011-10-30 | DRG: 867 | Disposition: A | Discharge status: Home / Self Care | Payer: Medicare Other | Attending: Internal Medicine | Admitting: Internal Medicine

## 2011-10-26 DIAGNOSIS — E119 Type 2 diabetes mellitus without complications: Secondary | ICD-10-CM

## 2011-10-26 DIAGNOSIS — L03116 Cellulitis of left lower limb: Secondary | ICD-10-CM

## 2011-10-26 DIAGNOSIS — R05 Cough: Secondary | ICD-10-CM | POA: Diagnosis present

## 2011-10-26 DIAGNOSIS — J189 Pneumonia, unspecified organism: Secondary | ICD-10-CM

## 2011-10-26 DIAGNOSIS — E860 Dehydration: Secondary | ICD-10-CM

## 2011-10-26 DIAGNOSIS — I4891 Unspecified atrial fibrillation: Secondary | ICD-10-CM

## 2011-10-26 DIAGNOSIS — A779 Spotted fever, unspecified: Principal | ICD-10-CM | POA: Diagnosis present

## 2011-10-26 DIAGNOSIS — F3289 Other specified depressive episodes: Secondary | ICD-10-CM | POA: Diagnosis present

## 2011-10-26 DIAGNOSIS — Z79899 Other long term (current) drug therapy: Secondary | ICD-10-CM

## 2011-10-26 DIAGNOSIS — E669 Obesity, unspecified: Secondary | ICD-10-CM

## 2011-10-26 DIAGNOSIS — E871 Hypo-osmolality and hyponatremia: Secondary | ICD-10-CM | POA: Diagnosis present

## 2011-10-26 DIAGNOSIS — I4892 Unspecified atrial flutter: Secondary | ICD-10-CM | POA: Diagnosis present

## 2011-10-26 DIAGNOSIS — Z8673 Personal history of transient ischemic attack (TIA), and cerebral infarction without residual deficits: Secondary | ICD-10-CM

## 2011-10-26 DIAGNOSIS — I69922 Dysarthria following unspecified cerebrovascular disease: Secondary | ICD-10-CM

## 2011-10-26 DIAGNOSIS — R059 Cough, unspecified: Secondary | ICD-10-CM

## 2011-10-26 DIAGNOSIS — D649 Anemia, unspecified: Secondary | ICD-10-CM

## 2011-10-26 DIAGNOSIS — I10 Essential (primary) hypertension: Secondary | ICD-10-CM

## 2011-10-26 DIAGNOSIS — G4730 Sleep apnea, unspecified: Secondary | ICD-10-CM | POA: Diagnosis present

## 2011-10-26 DIAGNOSIS — Z794 Long term (current) use of insulin: Secondary | ICD-10-CM

## 2011-10-26 DIAGNOSIS — R197 Diarrhea, unspecified: Secondary | ICD-10-CM

## 2011-10-26 DIAGNOSIS — L0390 Cellulitis, unspecified: Secondary | ICD-10-CM | POA: Diagnosis present

## 2011-10-26 DIAGNOSIS — I4819 Other persistent atrial fibrillation: Secondary | ICD-10-CM | POA: Diagnosis present

## 2011-10-26 DIAGNOSIS — E785 Hyperlipidemia, unspecified: Secondary | ICD-10-CM

## 2011-10-26 DIAGNOSIS — I69969 Other paralytic syndrome following unspecified cerebrovascular disease affecting unspecified side: Secondary | ICD-10-CM

## 2011-10-26 DIAGNOSIS — R509 Fever, unspecified: Secondary | ICD-10-CM

## 2011-10-26 DIAGNOSIS — Z6836 Body mass index (BMI) 36.0-36.9, adult: Secondary | ICD-10-CM

## 2011-10-26 DIAGNOSIS — A770 Spotted fever due to Rickettsia rickettsii: Secondary | ICD-10-CM

## 2011-10-26 DIAGNOSIS — F329 Major depressive disorder, single episode, unspecified: Secondary | ICD-10-CM | POA: Diagnosis present

## 2011-10-26 DIAGNOSIS — K8020 Calculus of gallbladder without cholecystitis without obstruction: Secondary | ICD-10-CM | POA: Diagnosis present

## 2011-10-26 DIAGNOSIS — D696 Thrombocytopenia, unspecified: Secondary | ICD-10-CM

## 2011-10-26 DIAGNOSIS — L02419 Cutaneous abscess of limb, unspecified: Secondary | ICD-10-CM | POA: Diagnosis present

## 2011-10-26 HISTORY — DX: Calculus of gallbladder without cholecystitis without obstruction: K80.20

## 2011-10-26 HISTORY — DX: Gastro-esophageal reflux disease without esophagitis: K21.9

## 2011-10-26 HISTORY — DX: Obesity, unspecified: E66.9

## 2011-10-26 HISTORY — DX: Unspecified atrial fibrillation: I48.91

## 2011-10-26 HISTORY — DX: Spotted fever due to Rickettsia rickettsii: A77.0

## 2011-10-26 LAB — URINALYSIS, ROUTINE W REFLEX MICROSCOPIC
Bilirubin Urine: NEGATIVE
Glucose, UA: NEGATIVE mg/dL
Ketones, ur: NEGATIVE mg/dL
Leukocytes, UA: NEGATIVE
Nitrite: NEGATIVE
Protein, ur: NEGATIVE mg/dL
Specific Gravity, Urine: 1.015 (ref 1.005–1.030)
Urobilinogen, UA: 0.2 mg/dL (ref 0.0–1.0)
pH: 7.5 (ref 5.0–8.0)

## 2011-10-26 LAB — CBC WITH DIFFERENTIAL/PLATELET
Basophils Absolute: 0 10*3/uL (ref 0.0–0.1)
Basophils Relative: 0 % (ref 0–1)
Eosinophils Absolute: 0.1 10*3/uL (ref 0.0–0.7)
Eosinophils Relative: 1 % (ref 0–5)
HCT: 42.4 % (ref 39.0–52.0)
Hemoglobin: 14.6 g/dL (ref 13.0–17.0)
Lymphocytes Relative: 6 % — ABNORMAL LOW (ref 12–46)
Lymphs Abs: 0.6 10*3/uL — ABNORMAL LOW (ref 0.7–4.0)
MCH: 30.9 pg (ref 26.0–34.0)
MCHC: 34.4 g/dL (ref 30.0–36.0)
MCV: 89.6 fL (ref 78.0–100.0)
Monocytes Absolute: 1 10*3/uL (ref 0.1–1.0)
Monocytes Relative: 9 % (ref 3–12)
Neutro Abs: 9.2 10*3/uL — ABNORMAL HIGH (ref 1.7–7.7)
Neutrophils Relative %: 85 % — ABNORMAL HIGH (ref 43–77)
Platelets: 183 10*3/uL (ref 150–400)
RBC: 4.73 MIL/uL (ref 4.22–5.81)
RDW: 12.5 % (ref 11.5–15.5)
WBC: 10.9 10*3/uL — ABNORMAL HIGH (ref 4.0–10.5)

## 2011-10-26 LAB — COMPREHENSIVE METABOLIC PANEL
ALT: 19 U/L (ref 0–53)
AST: 26 U/L (ref 0–37)
Albumin: 4.1 g/dL (ref 3.5–5.2)
Alkaline Phosphatase: 81 U/L (ref 39–117)
BUN: 28 mg/dL — ABNORMAL HIGH (ref 6–23)
CO2: 25 mEq/L (ref 19–32)
Calcium: 10.3 mg/dL (ref 8.4–10.5)
Chloride: 107 mEq/L (ref 96–112)
Creatinine, Ser: 1.25 mg/dL (ref 0.50–1.35)
GFR calc Af Amer: 66 mL/min — ABNORMAL LOW (ref >90)
GFR calc non Af Amer: 57 mL/min — ABNORMAL LOW (ref >90)
Glucose, Bld: 93 mg/dL (ref 70–99)
Potassium: 3.7 mEq/L (ref 3.5–5.1)
Sodium: 143 mEq/L (ref 135–145)
Total Bilirubin: 0.6 mg/dL (ref 0.3–1.2)
Total Protein: 6.9 g/dL (ref 6.0–8.3)

## 2011-10-26 LAB — LIPASE, BLOOD: Lipase: 14 U/L (ref 11–59)

## 2011-10-26 LAB — URINE MICROSCOPIC-ADD ON

## 2011-10-26 LAB — TROPONIN I: Troponin I: <0.3 ng/mL (ref <0.30)

## 2011-10-26 LAB — LACTIC ACID, PLASMA: Lactic Acid, Venous: 2.2 mmol/L (ref 0.5–2.2)

## 2011-10-26 LAB — GLUCOSE, CAPILLARY: Glucose-Capillary: 192 mg/dL — ABNORMAL HIGH (ref 70–99)

## 2011-10-26 LAB — PROCALCITONIN: Procalcitonin: 0.43 ng/mL

## 2011-10-26 MED ORDER — DABIGATRAN ETEXILATE MESYLATE 150 MG PO CAPS
150.0000 mg | ORAL_CAPSULE | Freq: Two times a day (BID) | ORAL | Status: DC
  Administered 2011-10-26 – 2011-10-30 (×8): 150 mg via ORAL
  Filled 2011-10-26 (×10): qty 1

## 2011-10-26 MED ORDER — ACETAMINOPHEN 325 MG PO TABS
325.0000 mg | ORAL_TABLET | Freq: Once | ORAL | Status: AC
  Administered 2011-10-26: 325 mg via ORAL
  Filled 2011-10-26: qty 1

## 2011-10-26 MED ORDER — ALBUTEROL SULFATE (5 MG/ML) 0.5% IN NEBU: 2.5000 mg | INHALATION_SOLUTION | RESPIRATORY_TRACT | Status: DC | PRN

## 2011-10-26 MED ORDER — ATORVASTATIN CALCIUM 40 MG PO TABS
40.0000 mg | ORAL_TABLET | Freq: Every day | ORAL | Status: DC
  Administered 2011-10-27 – 2011-10-29 (×3): 40 mg via ORAL
  Filled 2011-10-26 (×3): qty 1

## 2011-10-26 MED ORDER — ACETAMINOPHEN 325 MG PO TABS
650.0000 mg | ORAL_TABLET | Freq: Once | ORAL | Status: AC
  Administered 2011-10-26: 650 mg via ORAL

## 2011-10-26 MED ORDER — CARVEDILOL 12.5 MG PO TABS
25.0000 mg | ORAL_TABLET | Freq: Two times a day (BID) | ORAL | Status: DC
  Administered 2011-10-27 – 2011-10-30 (×7): 25 mg via ORAL
  Filled 2011-10-26 (×7): qty 2

## 2011-10-26 MED ORDER — ALUM & MAG HYDROXIDE-SIMETH 200-200-20 MG/5ML PO SUSP: 30.0000 mL | Freq: Four times a day (QID) | ORAL | Status: DC | PRN

## 2011-10-26 MED ORDER — ESCITALOPRAM OXALATE 10 MG PO TABS
10.0000 mg | ORAL_TABLET | Freq: Every day | ORAL | Status: DC
  Administered 2011-10-27 – 2011-10-30 (×4): 10 mg via ORAL
  Filled 2011-10-26 (×4): qty 1

## 2011-10-26 MED ORDER — ACETAMINOPHEN 325 MG PO TABS
650.0000 mg | ORAL_TABLET | Freq: Four times a day (QID) | ORAL | Status: DC | PRN
  Administered 2011-10-26 – 2011-10-27 (×2): 650 mg via ORAL
  Filled 2011-10-26 (×2): qty 2

## 2011-10-26 MED ORDER — ACETAMINOPHEN 325 MG PO TABS
ORAL_TABLET | ORAL | Status: AC
  Administered 2011-10-26: 650 mg via ORAL
  Filled 2011-10-26: qty 2

## 2011-10-26 MED ORDER — VANCOMYCIN HCL IN DEXTROSE 1-5 GM/200ML-% IV SOLN
1000.0000 mg | Freq: Once | INTRAVENOUS | Status: DC
  Filled 2011-10-26: qty 200

## 2011-10-26 MED ORDER — ACETAMINOPHEN 650 MG RE SUPP: 650.0000 mg | Freq: Four times a day (QID) | RECTAL | Status: DC | PRN

## 2011-10-26 MED ORDER — MOXIFLOXACIN HCL IN NACL 400 MG/250ML IV SOLN
400.0000 mg | INTRAVENOUS | Status: DC
  Administered 2011-10-27: 400 mg via INTRAVENOUS
  Filled 2011-10-26 (×3): qty 250

## 2011-10-26 MED ORDER — ACETAMINOPHEN 325 MG PO TABS
ORAL_TABLET | ORAL | Status: AC
  Filled 2011-10-26: qty 1

## 2011-10-26 MED ORDER — SODIUM CHLORIDE 0.9 % IV SOLN
INTRAVENOUS | Status: DC
  Administered 2011-10-26: 13:00:00 via INTRAVENOUS

## 2011-10-26 MED ORDER — INSULIN ASPART 100 UNIT/ML ~~LOC~~ SOLN: 0.0000 [IU] | Freq: Every day | SUBCUTANEOUS | Status: DC

## 2011-10-26 MED ORDER — DEXTROSE 5 % IV SOLN
2.0000 g | Freq: Once | INTRAVENOUS | Status: DC
  Filled 2011-10-26: qty 2

## 2011-10-26 MED ORDER — VANCOMYCIN HCL IN DEXTROSE 1-5 GM/200ML-% IV SOLN
1000.0000 mg | Freq: Two times a day (BID) | INTRAVENOUS | Status: DC
  Administered 2011-10-26 – 2011-10-29 (×6): 1000 mg via INTRAVENOUS
  Filled 2011-10-26 (×6): qty 200

## 2011-10-26 MED ORDER — ONDANSETRON HCL 4 MG/2ML IJ SOLN: 4.0000 mg | Freq: Four times a day (QID) | INTRAMUSCULAR | Status: DC | PRN

## 2011-10-26 MED ORDER — METRONIDAZOLE IN NACL 5-0.79 MG/ML-% IV SOLN
500.0000 mg | Freq: Three times a day (TID) | INTRAVENOUS | Status: DC
  Administered 2011-10-26 – 2011-10-28 (×5): 500 mg via INTRAVENOUS
  Filled 2011-10-26 (×9): qty 100

## 2011-10-26 MED ORDER — ONDANSETRON HCL 4 MG PO TABS: 4.0000 mg | ORAL_TABLET | Freq: Four times a day (QID) | ORAL | Status: DC | PRN

## 2011-10-26 MED ORDER — MORPHINE SULFATE 2 MG/ML IJ SOLN
2.0000 mg | INTRAMUSCULAR | Status: DC | PRN
  Administered 2011-10-26: 2 mg via INTRAVENOUS
  Filled 2011-10-26: qty 1

## 2011-10-26 MED ORDER — POTASSIUM CHLORIDE IN NACL 20-0.9 MEQ/L-% IV SOLN
INTRAVENOUS | Status: DC
  Administered 2011-10-26 – 2011-10-28 (×3): via INTRAVENOUS

## 2011-10-26 MED ORDER — INSULIN ASPART 100 UNIT/ML ~~LOC~~ SOLN
0.0000 [IU] | Freq: Three times a day (TID) | SUBCUTANEOUS | Status: DC
  Administered 2011-10-27: 2 [IU] via SUBCUTANEOUS

## 2011-10-26 NOTE — Progress Notes (Signed)
ANTIBIOTIC CONSULT NOTE - INITIAL  Pharmacy Consult for Vancomycin Indication: rule out pneumonia  No Known Allergies  Patient Measurements: Height: 5\' 8"  (172.7 cm) Weight: 195 lb (88.451 kg) IBW/kg (Calculated) : 68.4   Vital Signs: Temp: 99.8 F (37.7 C) (07/26 1637) Temp src: Oral (07/26 1637) BP: 131/60 mmHg (07/26 1600) Pulse Rate: 100  (07/26 1600) Intake/Output from previous day:   Intake/Output from this shift:    Labs:  Basename 10/26/11 1252  WBC 10.9*  HGB 14.6  PLT 183  LABCREA --  CREATININE 1.25   Estimated Creatinine Clearance: 59.4 ml/min (by C-G formula based on Cr of 1.25). No results found for this basename: VANCOTROUGH:2,VANCOPEAK:2,VANCORANDOM:2,GENTTROUGH:2,GENTPEAK:2,GENTRANDOM:2,TOBRATROUGH:2,TOBRAPEAK:2,TOBRARND:2,AMIKACINPEAK:2,AMIKACINTROU:2,AMIKACIN:2, in the last 72 hours   Microbiology: No results found for this or any previous visit (from the past 720 hour(s)).  Medical History: Past Medical History  Diagnosis Date  . Hypertension   . Diabetes mellitus   . 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 Stroke   . Sleep apnea     cpap  . Shortness of breath     exertion  . Depression   . Atrial fibrillation   . GERD (gastroesophageal reflux disease)   . Obesity     Medications:  Scheduled:    . acetaminophen  325 mg Oral Once  . acetaminophen  650 mg Oral Once  . atorvastatin  40 mg Oral q1800  . carvedilol  25 mg Oral BID WC  . dabigatran  150 mg Oral Q12H  . escitalopram  10 mg Oral Daily  . insulin aspart  0-5 Units Subcutaneous QHS  . insulin aspart  0-9 Units Subcutaneous TID WC  . metronidazole  500 mg Intravenous Q8H  . moxifloxacin  400 mg Intravenous Q24H  . vancomycin  1,000 mg Intravenous Q12H  . DISCONTD: ceFEPime (MAXIPIME) IV  2 g Intravenous Once  . DISCONTD: vancomycin  1,000 mg Intravenous Once   Assessment: 71 yo M admitted with febrile illness to start  broad-spectrum antibiotics to cover for possible PNA vs enteric infection with Vancomcyin, Avelox, Flagyl.  Scr is slightly elevated from baseline.  Goal of Therapy:  Vancomycin trough level 15-20 mcg/ml  Plan:  1) Vancomycin 1gm IV Q12h 2) Check Vancomycin trough at steady state 3) Monitor renal function and cx data   Elson Clan 10/26/2011,8:27 PM

## 2011-10-26 NOTE — ED Provider Notes (Signed)
History     CSN: [id number]  Arrival date & time 10/26/11  1229   First MD Initiated Contact with Patient 10/26/11 1234      Chief Complaint  Patient presents with  . Weakness    HPI Pt was seen at 1240.  Per pt, c/o gradual onset and persistence of constant generalized weakness and fatigue that began this morning after he woke up.  Pt states he was outside for a short time, came indoors and had 1 episode of "diarrhea."  Denies CP/palpitations, no SOB/cough, no back pain, no N/V, no abd pain, no [place] or blood in stool.    Past Medical History  Diagnosis Date  . Hypertension   . Diabetes mellitus   . CVA (cerebral infarction)   . Stroke   . Sleep apnea     cpap  . Shortness of breath     exertion  . Depression     Past Surgical History  Procedure Date  . Knee surgery   . Tonsillectomy   . Colonoscopy   . Tee without cardioversion 07/12/2011    Procedure: TRANSESOPHAGEAL ECHOCARDIOGRAM (TEE);  Surgeon: [name], MD;  Location: [facility] ENDOSCOPY;  Service: Cardiovascular;  Laterality: N/A;  to be done at 1330  . Cardioversion 07/12/2011    Procedure: CARDIOVERSION;  Surgeon: [name], MD;  Location: [facility] ENDOSCOPY;  Service: Cardiovascular;  Laterality: N/A;    History  Substance Use Topics  . Smoking status: Never Smoker   . Smokeless tobacco: Not on file  . Alcohol Use: 1.2 oz/week    2 Glasses of wine per week    Review of Systems ROS: Statement: All systems negative except as marked or noted in the HPI; Constitutional: +generalized weakness/fatigue, fever and chills. ; ; Eyes: Negative for eye pain, redness and discharge. ; ; ENMT: Negative for ear pain, hoarseness, nasal congestion, sinus pressure and sore throat. ; ; Cardiovascular: Negative for chest pain, palpitations, diaphoresis, dyspnea and peripheral edema. ; ; Respiratory: Negative for cough, wheezing and stridor. ; ; Gastrointestinal: +diarrhea. Negative for nausea, vomiting, abdominal pain, blood  in stool, hematemesis, jaundice and rectal bleeding. . ; ; Genitourinary: Negative for dysuria, flank pain and hematuria. ; ; Musculoskeletal: Negative for back pain and neck pain. Negative for swelling and trauma.; ; Skin: Negative for pruritus, rash, abrasions, blisters, bruising and skin lesion.; ; Neuro: Negative for headache, lightheadedness and neck stiffness. Negative for altered level of consciousness , altered mental status, extremity weakness, paresthesias, involuntary movement, seizure and syncope.     Allergies  Review of patient's allergies indicates no known allergies.  Home Medications   Current Outpatient Rx  Name Route Sig Dispense Refill  . AMLODIPINE BESYLATE 2.5 MG PO TABS Oral Take 2.5 mg by mouth daily.    Marland Kitchen BENAZEPRIL HCL 10 MG PO TABS Oral Take 10 mg by mouth daily.    Marland Kitchen CARVEDILOL 12.5 MG PO TABS Oral Take 2 tablets (25 mg total) by mouth 2 (two) times daily with a meal.    . CHOLINE FENOFIBRATE 135 MG PO CPDR Oral Take 135 mg by mouth daily.    Marland Kitchen DABIGATRAN ETEXILATE MESYLATE 150 MG PO CAPS Oral Take 1 capsule (150 mg total) by mouth every 12 (twelve) hours. 60 capsule 5  . ESCITALOPRAM OXALATE 10 MG PO TABS Oral Take 10 mg by mouth daily.    Marland Kitchen EZETIMIBE 10 MG PO TABS Oral Take 10 mg by mouth daily.    . INSULIN GLARGINE 100  UNIT/ML [place] SOLN Subcutaneous Inject 32 Units into the skin at bedtime.    . INSULIN LISPRO (HUMAN) 100 UNIT/ML [place] SOLN Subcutaneous Inject 14-20 Units into the skin 3 (three) times daily before meals. Inject 14 units every morning with breakfast, 17 units with lunch, and 20 units with supper.  Humalog Kwikpen    . CALCIUM POLYCARBOPHIL 625 MG PO TABS Oral Take 625 mg by mouth daily.    Marland Kitchen SIMVASTATIN 80 MG PO TABS Oral Take 80 mg by mouth daily.    Marland Kitchen VALSARTAN-HYDROCHLOROTHIAZIDE 320-25 MG PO TABS Oral Take 1 tablet by mouth daily.      BP 151/65  Pulse 102  Temp 102.8 F (39.3 C) (Oral)  Resp 15  Ht 5\' 8"  (1.727 m)  Wt 195 lb (88.451 kg)   BMI 29.65 kg/m2  SpO2 93%  Physical Exam 1245: Physical examination:  Nursing notes reviewed; Vital signs and O2 SAT reviewed; +febrile; Constitutional: Well developed, Well nourished, In no acute distress; Head:  Normocephalic, atraumatic; Eyes: EOMI, PERRL, No scleral icterus; ENMT: Mouth and pharynx normal, Mucous membranes dry; Neck: Supple, Full range of motion, No lymphadenopathy; Cardiovascular: Regular rate and rhythm, No gallop; Respiratory: Breath sounds clear & equal bilaterally, No wheezes.  Speaking full sentences with ease, Normal respiratory effort/excursion; Chest: Nontender, Movement normal; Abdomen: Soft, Nontender, Nondistended, Normal bowel sounds; Genitourinary: No CVA tenderness; Extremities: Pulses normal, No tenderness, No edema, No calf edema or asymmetry.; Neuro: AA&Ox3, Major CN grossly intact.  Speech clear. No facial droop. +mild left sided weakness per hx previous CVA, otherwise no gross focal motor deficits in extremities.; Skin: Color normal, Warm, Dry.   ED Course  Procedures   MDM  MDM Reviewed: nursing note, previous chart and vitals Reviewed previous: ECG Interpretation: ECG, labs and x-ray      Date: 10/26/2011  Rate: 105  Rhythm: sinus tachycardia  QRS Axis: left  Intervals: PR prolonged  ST/T Wave abnormalities: normal  Conduction Disutrbances:first-degree A-V block  and nonspecific intraventricular conduction delay  Narrative Interpretation: LVH with RA  Old EKG Reviewed: unchanged; no significant changes from previous EKG dated 06/20/2010; EKG dated 05/14/2011 was afib.   Results for orders placed during the hospital encounter of 10/26/11  CBC WITH DIFFERENTIAL      Component Value Range   WBC 10.9 (*) 4.0 - 10.5 K/uL   RBC 4.73  4.22 - 5.81 MIL/uL   Hemoglobin 14.6  13.0 - 17.0 g/dL   HCT 16.1  09.6 - 04.5 %   MCV 89.6  78.0 - 100.0 fL   MCH 30.9  26.0 - 34.0 pg   MCHC 34.4  30.0 - 36.0 g/dL   RDW 40.9  81.1 - 91.4 %   Platelets 183   150 - 400 K/uL   Neutrophils Relative 85 (*) 43 - 77 %   Neutro Abs 9.2 (*) 1.7 - 7.7 K/uL   Lymphocytes Relative 6 (*) 12 - 46 %   Lymphs Abs 0.6 (*) 0.7 - 4.0 K/uL   Monocytes Relative 9  3 - 12 %   Monocytes Absolute 1.0  0.1 - 1.0 K/uL   Eosinophils Relative 1  0 - 5 %   Eosinophils Absolute 0.1  0.0 - 0.7 K/uL   Basophils Relative 0  0 - 1 %   Basophils Absolute 0.0  0.0 - 0.1 K/uL  COMPREHENSIVE METABOLIC PANEL      Component Value Range   Sodium 143  135 - 145 mEq/L   Potassium 3.7  3.5 - 5.1 mEq/L   Chloride 107  96 - 112 mEq/L   CO2 25  19 - 32 mEq/L   Glucose, Bld 93  70 - 99 mg/dL   BUN 28 (*) 6 - 23 mg/dL   Creatinine, Ser 8.29  0.50 - 1.35 mg/dL   Calcium 56.2  8.4 - 13.0 mg/dL   Total Protein 6.9  6.0 - 8.3 g/dL   Albumin 4.1  3.5 - 5.2 g/dL   AST 26  0 - 37 U/L   ALT 19  0 - 53 U/L   Alkaline Phosphatase 81  39 - 117 U/L   Total Bilirubin 0.6  0.3 - 1.2 mg/dL   GFR calc non Af Amer 57 (*) >90 mL/min   GFR calc Af Amer 66 (*) >90 mL/min  LIPASE, BLOOD      Component Value Range   Lipase 14  11 - 59 U/L  LACTIC ACID, PLASMA      Component Value Range   Lactic Acid, Venous 2.2  0.5 - 2.2 mmol/L  PROCALCITONIN      Component Value Range   Procalcitonin 0.43    TROPONIN I      Component Value Range   Troponin I <0.30  <0.30 ng/mL  URINALYSIS, ROUTINE W REFLEX MICROSCOPIC      Component Value Range   Color, Urine YELLOW  YELLOW   APPearance CLEAR  CLEAR   Specific Gravity, Urine 1.015  1.005 - 1.030   pH 7.5  5.0 - 8.0   Glucose, UA NEGATIVE  NEGATIVE mg/dL   Hgb urine dipstick TRACE (*) NEGATIVE   Bilirubin Urine NEGATIVE  NEGATIVE   Ketones, ur NEGATIVE  NEGATIVE mg/dL   Protein, ur NEGATIVE  NEGATIVE mg/dL   Urobilinogen, UA 0.2  0.0 - 1.0 mg/dL   Nitrite NEGATIVE  NEGATIVE   Leukocytes, UA NEGATIVE  NEGATIVE  URINE MICROSCOPIC-ADD ON      Component Value Range   Squamous Epithelial / LPF RARE  RARE   WBC, UA 0-2  <3 WBC/hpf   RBC / HPF 0-2  <3  RBC/hpf   Bacteria, UA RARE  RARE   Dg Chest Port 1 View 10/26/2011  *RADIOLOGY REPORT*  Clinical Data: Atrial fibrillation.  PORTABLE CHEST - 1 VIEW  Comparison: PA and lateral chest 05/14/2011.  Findings: There is some basilar atelectasis.  Cardiomegaly again seen.  No pneumothorax or effusion.  IMPRESSION: Cardiomegaly and bibasilar subsegmental atelectasis.  Original Report Authenticated By: Bernadene Bell. D'ALESSIO, M.D.      1600:  Pt cannot stand due to "feeling too weak."  UC and BC x2 pending.  No clear source of fever at this time.  APAP given.  Doubt heat stroke/exhaustion as cause for symptoms, as fever has not improved after several hours in the ED in a cool environment and pt endorses he was not outside for a very long time.  Dx testing d/w pt and family.  Questions answered.  Verb understanding, agreeable to admit. T/C to [facility]. Sherrie Mustache, case discussed, including:  HPI, pertinent PM/SHx, VS/PE, dx testing, ED course and treatment:  Agreeable to admit, requests to obtain tele bed to team 1, start broad spectrum abx after [facility] x2 obtained.       Laray Anger, DO 10/27/11 [phone]

## 2011-10-26 NOTE — H&P (Signed)
[facility] History and Physical  [name] ZOX:[id number] DOB: [birth year] DOA: 10/26/2011  Referring physician: Dr. Clarene Duke PCP: Julian Hy, MD   Chief Complaint: Generalized weakness.  HPI: The patient is a 71 year old man with a history significant for atrial fibrillation, status post successful cardioversion in April 2013, two strokes, diabetes mellitus, and hypertension, who presents to the emergency department today with a chief complaint of generalized weakness. The patient has had ill feeling for the past several days. This morning, while he was performing light yard work, he became significantly weaker. His wife noticed that he was having chills as he was shaking and then he broke out into a sweat. There was no focal weakness over and above his normal left-sided weakness, but she noted that he just was weak all over. He was having difficulty ambulating secondary to the generalized weakness. He has had intermittent loose stools/diarrhea numbering 1 this morning and 2-3 yesterday. He has had a nonproductive cough. He denies pleurisy, chest pain, shortness of breath, earache, sore throat, body aches, abdominal pain, or pain with urination. He has no known tick bites. He has been exposed to his father who is being treated for cellulitis.  In the emergency department, his fever had gotten as high as 103.42F. Otherwise, he is hemodynamically stable. His lab data are significant for a WBC of 10.9, BUN of 28, normal troponin I., and a urinalysis without WBCs. His chest x-ray reveals cardiomegaly and bibasilar subsegmental atelectasis. He is being admitted for further evaluation and management.   Review of Systems:  As above in history present illness. He has chronic left-sided weakness and slurred speech. Otherwise, review of systems is negative.    Past Medical History  Diagnosis Date  . Hypertension   . Diabetes mellitus   . 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 Stroke   . Sleep apnea     cpap  . Shortness of breath     exertion  . Depression   . Atrial fibrillation   . GERD (gastroesophageal reflux disease)   . Obesity    Past Surgical History  Procedure Date  . Knee surgery   . Tonsillectomy   . Colonoscopy   . Tee without cardioversion 07/12/2011    Procedure: TRANSESOPHAGEAL ECHOCARDIOGRAM (TEE);  Surgeon: [name], MD;  Location[facility] ENDOSCOPY;  Service: Cardiovascular;  Laterality: N/A;  to be done at 1330  . Cardioversion 07/12/2011    Procedure: CARDIOVERSION;  Surgeon: [name], MD;  Location: [facility] ENDOSCOPY;  Service: Cardiovascular;  Laterality: N/A;   Social History:  He is married. He lives in [place]. He has 2 children. He is retired. He denies tobacco and illicit drug use. He drinks wine on occasion. He still drives. He uses a cane to ambulate.   No Known Allergies  Family history: His father is 58 years of age and has diabetes mellitus. His mother died of complications of dementia.   Prior to Admission medications   Medication Sig Start Date End Date Taking? Authorizing Provider  amLODipine (NORVASC) 2.5 MG tablet Take 2.5 mg by mouth daily.   Yes Historical Provider, MD  benazepril (LOTENSIN) 10 MG tablet Take 10 mg by mouth daily.   Yes Historical Provider, MD  carvedilol (COREG) 12.5 MG tablet Take 2 tablets (25 mg total) by mouth 2 (two) times daily with a meal. 05/16/11  Yes Christiane Ha, MD  Choline Fenofibrate (TRILIPIX) 135 MG capsule Take 135  mg by mouth daily.   Yes Historical Provider, MD  dabigatran (PRADAXA) 150 MG CAPS Take 1 capsule (150 mg total) by mouth every 12 (twelve) hours. 07/12/11  Yes [name], MD  escitalopram (LEXAPRO) 10 MG tablet Take 10 mg by mouth daily.   Yes Historical Provider, MD  ezetimibe (ZETIA) 10 MG tablet Take 10 mg by mouth daily.   Yes Historical Provider, MD  insulin glargine (LANTUS SOLOSTAR) 100 UNIT/ML injection Inject  32 Units into the skin at bedtime.   Yes Historical Provider, MD  insulin lispro (HUMALOG) 100 UNIT/ML injection Inject 14-20 Units into the skin 3 (three) times daily before meals. Inject 14 units every morning with breakfast, 17 units with lunch, and 20 units with supper.  Humalog Kwikpen   Yes Historical Provider, MD  polycarbophil (FIBERCON) 625 MG tablet Take 625 mg by mouth daily.   Yes Historical Provider, MD  simvastatin (ZOCOR) 80 MG tablet Take 80 mg by mouth daily.   Yes Historical Provider, MD  valsartan-hydrochlorothiazide (DIOVAN-HCT) 320-25 MG per tablet Take 1 tablet by mouth daily.   Yes Historical Provider, MD   Physical Exam: Filed Vitals:   10/26/11 1500 10/26/11 1546 10/26/11 1600 10/26/11 1637  BP: 131/59 135/61 131/60   Pulse: 104 100 100   Temp:    99.8 F (37.7 C)  TempSrc:    Oral  Resp: 23 14 16    Height:      Weight:      SpO2: 95% 96% 97%      General: Pleasant obese 71 year old Caucasian man, who is diaphoretic, and appears ill but in no acute distress.   Eyes: Pupils are equal, round, and reactive to light. Extraocular movements are intact. Conjunctivae are clear. Sclerae are white.  ENT: Tympanic membranes are clear bilaterally. Nasal mucosa is dry. Oropharynx reveals dry mucous membranes. No posterior exudates or erythema.   Neck:Supple, no adenopathy, no thyromegaly, no JVD.   Cardiovascular:S1, S2, with a 2/6 systolic murmur.   Respiratory: Occasional fine crackles mid lobes. Breathing nonlabored.   Abdomen: Obese, positive bowel sounds, soft, nontender, nondistended. No masses palpated. No hepatosplenomegaly.   Skin: Fair turgor. Excoriations on the pretibial surfaces on the left leg greater than the right leg without significant erythema. Biopsy site on his back from previous excision of a mole reveals no erythema or purulence.   Musculoskeletal: Well-healed scar on the left knee. Pedal pulses palpable bilaterally. No pedal edema. No acute hot  red joints.   Psychiatric: Pleasant affect. Alert and oriented x3.   Neurologic: He has chronic dysarthria. A mild left facial droop. Otherwise, cranial nerves are intact. Strength on the left upper and left lower extremity is 5 minus over 5 and 5 over 5 on the right. Sensation grossly intact.   Labs on Admission:  Basic Metabolic Panel:  Lab 10/26/11 1610  NA 143  K 3.7  CL 107  CO2 25  GLUCOSE 93  BUN 28*  CREATININE 1.25  CALCIUM 10.3  MG --  PHOS --   Liver Function Tests:  Lab 10/26/11 1252  AST 26  ALT 19  ALKPHOS 81  BILITOT 0.6  PROT 6.9  ALBUMIN 4.1    Lab 10/26/11 1252  LIPASE 14  AMYLASE --   No results found for this basename: AMMONIA:5 in the last 168 hours CBC:  Lab 10/26/11 1252  WBC 10.9*  NEUTROABS 9.2*  HGB 14.6  HCT 42.4  MCV 89.6  PLT 183   Cardiac Enzymes:  Lab 10/26/11 1252  CKTOTAL --  CKMB --  CKMBINDEX --  TROPONINI <0.30    BNP (last 3 results) No results found for this basename: PROBNP:3 in the last 8760 hours CBG: No results found for this basename: GLUCAP:5 in the last 168 hours  Radiological Exams on Admission: Dg Chest Port 1 View  10/26/2011  *RADIOLOGY REPORT*  Clinical Data: Atrial fibrillation.  PORTABLE CHEST - 1 VIEW  Comparison: PA and lateral chest 05/14/2011.  Findings: There is some basilar atelectasis.  Cardiomegaly again seen.  No pneumothorax or effusion.  IMPRESSION: Cardiomegaly and bibasilar subsegmental atelectasis.  Original Report Authenticated By: Bernadene Bell. D'ALESSIO, M.D.    EKG:  sinus tachycardia, first degree AV block, incomplete right bundle branch block, heart rate 105 beats per minute.   Assessment/Plan Principal Problem:  *Febrile illness, acute Active Problems:  Cough  Atrial fibrillation  DM type 2 (diabetes mellitus, type 2)  H/O: stroke  Diarrhea  Dehydration  Benign hypertension   30. 71 year old man who presents with a febrile illness. The chest x-ray is not indicative of  pneumonia, but given his cough, and a few crackles auscultated on exam, he may have a pneumonia that has not fluffed out yet on the chest x-ray. Also concerning is diarrhea, but he is not having perfused multiple watery bowel movements, mostly loose and numbering 1-3 daily. His urinalysis is not indicative of infection.       Plan:  1. Blood cultures were ordered in the emergency department. Will followup on the results. 2. We'll start antibiotic treatment for pneumonia and an enteric illness with Avelox, metronidazole, and vancomycin. 3. IV fluid hydration. 4. We'll hold some of his antihypertensive medications but will continue carvedilol. We'll also continue Pradaxa. Will treat his diabetes with sliding scale NovoLog but will hold the Lantus until his intake improves. 5. For further evaluation, we'll order a C. difficile PCR and [place] spotted fever titer.  Code Status: Full code  Family Communication: Discussed with his wife  Disposition Plan: Pending; home versus rehabilitation.   Time spent: One hour   [facility] Pager [phone]  If 7PM-7AM, please contact night-coverage [email or web address] Password [facility] 10/26/2011, 6:07 PM

## 2011-10-26 NOTE — ED Notes (Signed)
Patient was outside, began to feel weak and went inside. Had one episode of diarrhea and had generalized weakness.

## 2011-10-27 ENCOUNTER — Inpatient Hospital Stay (HOSPITAL_COMMUNITY): Payer: Medicare Other

## 2011-10-27 DIAGNOSIS — E860 Dehydration: Secondary | ICD-10-CM

## 2011-10-27 DIAGNOSIS — J189 Pneumonia, unspecified organism: Secondary | ICD-10-CM

## 2011-10-27 LAB — COMPREHENSIVE METABOLIC PANEL
ALT: 20 U/L (ref 0–53)
AST: 36 U/L (ref 0–37)
Albumin: 3.2 g/dL — ABNORMAL LOW (ref 3.5–5.2)
Alkaline Phosphatase: 58 U/L (ref 39–117)
BUN: 24 mg/dL — ABNORMAL HIGH (ref 6–23)
CO2: 25 mEq/L (ref 19–32)
Calcium: 9 mg/dL (ref 8.4–10.5)
Chloride: 103 mEq/L (ref 96–112)
Creatinine, Ser: 1.29 mg/dL (ref 0.50–1.35)
GFR calc Af Amer: 63 mL/min — ABNORMAL LOW (ref >90)
GFR calc non Af Amer: 55 mL/min — ABNORMAL LOW (ref >90)
Glucose, Bld: 189 mg/dL — ABNORMAL HIGH (ref 70–99)
Potassium: 3.5 mEq/L (ref 3.5–5.1)
Sodium: 136 mEq/L (ref 135–145)
Total Bilirubin: 0.8 mg/dL (ref 0.3–1.2)
Total Protein: 5.8 g/dL — ABNORMAL LOW (ref 6.0–8.3)

## 2011-10-27 LAB — CBC
HCT: 39.7 % (ref 39.0–52.0)
Hemoglobin: 13.5 g/dL (ref 13.0–17.0)
MCH: 30.7 pg (ref 26.0–34.0)
MCHC: 34 g/dL (ref 30.0–36.0)
MCV: 90.2 fL (ref 78.0–100.0)
Platelets: 154 10*3/uL (ref 150–400)
RBC: 4.4 MIL/uL (ref 4.22–5.81)
RDW: 12.8 % (ref 11.5–15.5)
WBC: 13.7 10*3/uL — ABNORMAL HIGH (ref 4.0–10.5)

## 2011-10-27 LAB — GLUCOSE, CAPILLARY
Glucose-Capillary: 169 mg/dL — ABNORMAL HIGH (ref 70–99)
Glucose-Capillary: 217 mg/dL — ABNORMAL HIGH (ref 70–99)
Glucose-Capillary: 190 mg/dL — ABNORMAL HIGH (ref 70–99)
Glucose-Capillary: 210 mg/dL — ABNORMAL HIGH (ref 70–99)

## 2011-10-27 LAB — HEMOGLOBIN A1C
Hgb A1c MFr Bld: 7.7 % — ABNORMAL HIGH (ref <5.7)
Mean Plasma Glucose: 174 mg/dL — ABNORMAL HIGH (ref <117)

## 2011-10-27 LAB — PROTIME-INR
INR: 1.37 (ref 0.00–1.49)
Prothrombin Time: 17.1 seconds — ABNORMAL HIGH (ref 11.6–15.2)

## 2011-10-27 MED ORDER — INSULIN ASPART 100 UNIT/ML ~~LOC~~ SOLN
0.0000 [IU] | Freq: Three times a day (TID) | SUBCUTANEOUS | Status: DC
  Administered 2011-10-27: 3 [IU] via SUBCUTANEOUS
  Administered 2011-10-27: 5 [IU] via SUBCUTANEOUS
  Administered 2011-10-28: 3 [IU] via SUBCUTANEOUS

## 2011-10-27 MED ORDER — INSULIN GLARGINE 100 UNIT/ML ~~LOC~~ SOLN
30.0000 [IU] | Freq: Every day | SUBCUTANEOUS | Status: DC
  Administered 2011-10-27: 30 [IU] via SUBCUTANEOUS

## 2011-10-27 MED ORDER — AMLODIPINE BESYLATE 5 MG PO TABS
2.5000 mg | ORAL_TABLET | Freq: Every day | ORAL | Status: DC
  Administered 2011-10-27 – 2011-10-30 (×4): 2.5 mg via ORAL
  Filled 2011-10-27 (×4): qty 1

## 2011-10-27 MED ORDER — BENAZEPRIL HCL 10 MG PO TABS
10.0000 mg | ORAL_TABLET | Freq: Every day | ORAL | Status: DC
  Administered 2011-10-27 – 2011-10-30 (×4): 10 mg via ORAL
  Filled 2011-10-27 (×4): qty 1

## 2011-10-27 MED ORDER — SODIUM CHLORIDE 0.9 % IJ SOLN
INTRAMUSCULAR | Status: AC
  Administered 2011-10-27: 12:00:00
  Filled 2011-10-27: qty 3

## 2011-10-27 MED ORDER — POTASSIUM CHLORIDE CRYS ER 20 MEQ PO TBCR
20.0000 meq | EXTENDED_RELEASE_TABLET | Freq: Two times a day (BID) | ORAL | Status: AC
  Administered 2011-10-27 (×2): 20 meq via ORAL
  Filled 2011-10-27 (×2): qty 1

## 2011-10-27 MED ORDER — INSULIN ASPART 100 UNIT/ML ~~LOC~~ SOLN
0.0000 [IU] | Freq: Every day | SUBCUTANEOUS | Status: DC
  Administered 2011-10-27: 2 [IU] via SUBCUTANEOUS

## 2011-10-27 NOTE — Progress Notes (Signed)
Subjective: The patient says that he feels a little bit better this morning. He has a mild dry cough, not productive. He denies chest pain, pleurisy, abdominal pain, diarrhea, or pain with urination.  Objective: Vital signs in last 24 hours: Filed Vitals:   10/26/11 1637 10/26/11 2220 10/27/11 0514 10/27/11 0605  BP:  105/55 146/68   Pulse:  75 72   Temp: 99.8 F (37.7 C) 99.8 F (37.7 C) 102.6 F (39.2 C) 100.7 F (38.2 C)  TempSrc: Oral Oral Oral Oral  Resp:  18 18   Height:      Weight:   103.4 kg (227 lb 15.3 oz)   SpO2:  96% 95%     Intake/Output Summary (Last 24 hours) at 10/27/11 0809 Last data filed at 10/27/11 1914  Gross per 24 hour  Intake 2085.42 ml  Output    225 ml  Net 1860.42 ml    Weight change:   Physical exam: General: Pleasant obese 71 year old Caucasian man lying in bed, in no acute distress. HEENT: Head is normocephalic, nontraumatic. Oropharynx reveals moist mucous membranes. No posterior exudates or erythema. Lungs: Occasional crackles in the mid lobes bilaterally. No wheezing or difficulty breathing. Heart: S1, S2, with a 2/6 systolic murmur. Abdomen: Obese, positive bowel sounds, soft, nontender, nondistended. Extremities: A few excoriated lesions on his left greater than right legs, scant erythema, no purulence or fluctuance. No pedal edema. Neuro/psychiatric: He is alert and oriented x3. Cranial nerves II through XII are intact with exception of mild left facial droop and mild dysarthria.  Lab Results: Basic Metabolic Panel:  Basename 10/27/11 0707 10/26/11 1252  NA 136 143  K 3.5 3.7  CL 103 107  CO2 25 25  GLUCOSE 189* 93  BUN 24* 28*  CREATININE 1.29 1.25  CALCIUM 9.0 10.3  MG -- --  PHOS -- --   Liver Function Tests:  [facility] 10/27/11 0707 10/26/11 1252  AST 36 26  ALT 20 19  ALKPHOS 58 81  BILITOT 0.8 0.6  PROT 5.8* 6.9  ALBUMIN 3.2* 4.1    Basename 10/26/11 1252  LIPASE 14  AMYLASE --   No results found for this  basename: AMMONIA:2 in the last 72 hours CBC:  Basename 10/27/11 0707 10/26/11 1252  WBC 13.7* 10.9*  NEUTROABS -- 9.2*  HGB 13.5 14.6  HCT 39.7 42.4  MCV 90.2 89.6  PLT 154 183   Cardiac Enzymes:  Basename 10/26/11 1252  CKTOTAL --  CKMB --  CKMBINDEX --  TROPONINI <0.30   BNP: No results found for this basename: PROBNP:3 in the last 72 hours D-Dimer: No results found for this basename: DDIMER:2 in the last 72 hours CBG:  Basename 10/26/11 2207  GLUCAP 192*   Hemoglobin A1C: No results found for this basename: HGBA1C in the last 72 hours Fasting Lipid Panel: No results found for this basename: CHOL,HDL,LDLCALC,TRIG,CHOLHDL,LDLDIRECT in the last 72 hours Thyroid Function Tests: No results found for this basename: TSH,T4TOTAL,FREET4,T3FREE,THYROIDAB in the last 72 hours Anemia Panel: No results found for this basename: VITAMINB12,FOLATE,FERRITIN,TIBC,IRON,RETICCTPCT in the last 72 hours Coagulation:  Basename 10/27/11 0707  LABPROT 17.1*  INR 1.37   Urine Drug Screen: Drugs of Abuse  No results found for this basename: labopia, cocainscrnur, labbenz, amphetmu, thcu, labbarb    Alcohol Level: No results found for this basename: ETH:2 in the last 72 hours Urinalysis:  Basename 10/26/11 1427  COLORURINE YELLOW  LABSPEC 1.015  PHURINE 7.5  GLUCOSEU NEGATIVE  HGBUR TRACE*  BILIRUBINUR NEGATIVE  KETONESUR NEGATIVE  PROTEINUR NEGATIVE  UROBILINOGEN 0.2  NITRITE NEGATIVE  LEUKOCYTESUR NEGATIVE   Misc. Labs:   Micro: Recent Results (from the past 240 hour(s))  CULTURE, BLOOD (ROUTINE X 2)     Status: Normal (Preliminary result)   Collection Time   10/26/11  4:15 PM      Component Value Range Status Comment   Specimen Description BLOOD LEFT ARM   Final    Special Requests BOTTLES DRAWN AEROBIC AND ANAEROBIC 6CC   Final    Culture NO GROWTH 1 DAY   Final    Report Status PENDING   Incomplete   CULTURE, BLOOD (ROUTINE X 2)     Status: Normal (Preliminary  result)   Collection Time   10/26/11  4:30 PM      Component Value Range Status Comment   Specimen Description BLOOD LEFT HAND   Final    Special Requests BOTTLES DRAWN AEROBIC AND ANAEROBIC 6CC   Final    Culture NO GROWTH 1 DAY   Final    Report Status PENDING   Incomplete     Studies/Results: Dg Chest Port 1 View  10/26/2011  *RADIOLOGY REPORT*  Clinical Data: Atrial fibrillation.  PORTABLE CHEST - 1 VIEW  Comparison: PA and lateral chest 05/14/2011.  Findings: There is some basilar atelectasis.  Cardiomegaly again seen.  No pneumothorax or effusion.  IMPRESSION: Cardiomegaly and bibasilar subsegmental atelectasis.  Original Report Authenticated By: Bernadene Bell. Maricela Curet, M.D.    Medications:  Scheduled:   . acetaminophen  325 mg Oral Once  . acetaminophen  650 mg Oral Once  . amLODipine  2.5 mg Oral Daily  . atorvastatin  40 mg Oral q1800  . benazepril  10 mg Oral Daily  . carvedilol  25 mg Oral BID WC  . dabigatran  150 mg Oral Q12H  . escitalopram  10 mg Oral Daily  . insulin aspart  0-15 Units Subcutaneous TID WC  . insulin aspart  0-5 Units Subcutaneous QHS  . insulin glargine  30 Units Subcutaneous QHS  . metronidazole  500 mg Intravenous Q8H  . moxifloxacin  400 mg Intravenous Q24H  . potassium chloride  20 mEq Oral BID  . vancomycin  1,000 mg Intravenous Q12H  . DISCONTD: ceFEPime (MAXIPIME) IV  2 g Intravenous Once  . DISCONTD: insulin aspart  0-5 Units Subcutaneous QHS  . DISCONTD: insulin aspart  0-9 Units Subcutaneous TID WC  . DISCONTD: vancomycin  1,000 mg Intravenous Once   Continuous:   . 0.9 % NaCl with KCl 20 mEq / L 75 mL/hr at 10/27/11 0753  . DISCONTD: sodium chloride Stopped (10/26/11 1518)   WUJ:[id number], acetaminophen, albuterol, alum & mag hydroxide-simeth, morphine injection, ondansetron (ZOFRAN) IV, ondansetron  Assessment: Principal Problem:  *Febrile illness, acute Active Problems:  Cough  Atrial fibrillation  DM type 2 (diabetes  mellitus, type 2)  H/O: stroke  Diarrhea  Dehydration  Benign hypertension     1. Acute febrile illness. Query diarrheal illness/query pneumonia Etiology unclear at this time, but the working diagnosis is community acquired pneumonia. The patient has had limited diarrhea, and therefore, C. difficile colitis is being assessed. We'll continue broad-spectrum antibiotic treatment with Avelox, vancomycin, and Flagyl. Will order a 2 view chest x-ray this morning to see if a pneumonia has fluffed out. If not, and if the patient continues to be febrile, will obtain a CT scan of his chest and his abdomen/pelvis. For now, blood cultures, urine culture, and [facility] spotted fever  titer are pending. Low threshold for starting doxycycline empirically. We'll also consider obtaining a 2-D echocardiogram to rule out gross vegetations. He has no neck rigidity or meningeal signs, and therefore, will not entertain an LP at this time.  Hypertension. His blood pressure is starting to increase. Most of his antihypertensive medications are being held. We'll gradually add back some accordingly.  Chronic atrial fibrillation. Status post cardioversion in April 2013. Currently, the patient is in normal sinus rhythm. We'll continue carvedilol and Pradaxa.  Type 2 diabetes mellitus. His capillary blood glucoses trending upward. Will adjust sliding scale NovoLog accordingly and add Lantus.  Dehydration. Resolving with IV fluid hydration.  Plan:  1. We'll order a 2 view chest x-ray today. 2. If he remains febrile, we'll consider ordering a CT scan of his abdomen and pelvis and a 2-D echocardiogram for further evaluation. We'll also consider adding doxycycline empirically. 3. C. difficile PCR pending. 4. Will restart Lantus. We'll increase sliding scale NovoLog to moderate scale. 5. Will restart benazepril and amlodipine. We'll hold Diovan/HCTZ until absolutely needed.     LOS: 1 day   , 10/27/2011,  8:09 AM

## 2011-10-28 ENCOUNTER — Inpatient Hospital Stay (HOSPITAL_COMMUNITY): Payer: Medicare Other

## 2011-10-28 DIAGNOSIS — D696 Thrombocytopenia, unspecified: Secondary | ICD-10-CM | POA: Diagnosis not present

## 2011-10-28 DIAGNOSIS — D649 Anemia, unspecified: Secondary | ICD-10-CM | POA: Diagnosis not present

## 2011-10-28 LAB — BASIC METABOLIC PANEL
BUN: 24 mg/dL — ABNORMAL HIGH (ref 6–23)
CO2: 22 mEq/L (ref 19–32)
Calcium: 8.5 mg/dL (ref 8.4–10.5)
Chloride: 102 mEq/L (ref 96–112)
Creatinine, Ser: 1.16 mg/dL (ref 0.50–1.35)
GFR calc Af Amer: 72 mL/min — ABNORMAL LOW (ref >90)
GFR calc non Af Amer: 62 mL/min — ABNORMAL LOW (ref >90)
Glucose, Bld: 222 mg/dL — ABNORMAL HIGH (ref 70–99)
Potassium: 3.9 mEq/L (ref 3.5–5.1)
Sodium: 132 mEq/L — ABNORMAL LOW (ref 135–145)

## 2011-10-28 LAB — CBC
HCT: 37.7 % — ABNORMAL LOW (ref 39.0–52.0)
Hemoglobin: 12.6 g/dL — ABNORMAL LOW (ref 13.0–17.0)
MCH: 30.4 pg (ref 26.0–34.0)
MCHC: 33.4 g/dL (ref 30.0–36.0)
MCV: 90.8 fL (ref 78.0–100.0)
Platelets: 149 10*3/uL — ABNORMAL LOW (ref 150–400)
RBC: 4.15 MIL/uL — ABNORMAL LOW (ref 4.22–5.81)
RDW: 12.9 % (ref 11.5–15.5)
WBC: 11.9 10*3/uL — ABNORMAL HIGH (ref 4.0–10.5)

## 2011-10-28 LAB — CLOSTRIDIUM DIFFICILE BY PCR: Toxigenic C. Difficile by PCR: NEGATIVE

## 2011-10-28 LAB — URINE CULTURE
Colony Count: NO GROWTH
Culture: NO GROWTH

## 2011-10-28 LAB — GLUCOSE, CAPILLARY
Glucose-Capillary: 194 mg/dL — ABNORMAL HIGH (ref 70–99)
Glucose-Capillary: 213 mg/dL — ABNORMAL HIGH (ref 70–99)
Glucose-Capillary: 264 mg/dL — ABNORMAL HIGH (ref 70–99)
Glucose-Capillary: 169 mg/dL — ABNORMAL HIGH (ref 70–99)

## 2011-10-28 MED ORDER — IOHEXOL 300 MG/ML  SOLN
40.0000 mL | Freq: Once | INTRAMUSCULAR | Status: AC | PRN
  Administered 2011-10-28: 40 mL via ORAL

## 2011-10-28 MED ORDER — MOXIFLOXACIN HCL 400 MG PO TABS
400.0000 mg | ORAL_TABLET | Freq: Every day | ORAL | Status: DC
  Administered 2011-10-28 – 2011-10-29 (×2): 400 mg via ORAL
  Filled 2011-10-28 (×2): qty 1

## 2011-10-28 MED ORDER — FUROSEMIDE 10 MG/ML IJ SOLN
10.0000 mg | Freq: Two times a day (BID) | INTRAMUSCULAR | Status: DC
  Administered 2011-10-28 – 2011-10-29 (×3): 10 mg via INTRAVENOUS
  Filled 2011-10-28 (×3): qty 2

## 2011-10-28 MED ORDER — INSULIN GLARGINE 100 UNIT/ML ~~LOC~~ SOLN
40.0000 [IU] | Freq: Every day | SUBCUTANEOUS | Status: DC
  Administered 2011-10-28 – 2011-10-29 (×2): 40 [IU] via SUBCUTANEOUS

## 2011-10-28 MED ORDER — METRONIDAZOLE 500 MG PO TABS
500.0000 mg | ORAL_TABLET | Freq: Three times a day (TID) | ORAL | Status: DC
  Administered 2011-10-28 – 2011-10-29 (×3): 500 mg via ORAL
  Filled 2011-10-28 (×3): qty 1

## 2011-10-28 MED ORDER — POTASSIUM CHLORIDE CRYS ER 20 MEQ PO TBCR
20.0000 meq | EXTENDED_RELEASE_TABLET | Freq: Two times a day (BID) | ORAL | Status: AC
  Administered 2011-10-28 (×2): 20 meq via ORAL
  Filled 2011-10-28 (×2): qty 1

## 2011-10-28 MED ORDER — INSULIN ASPART 100 UNIT/ML ~~LOC~~ SOLN
0.0000 [IU] | Freq: Three times a day (TID) | SUBCUTANEOUS | Status: DC
  Administered 2011-10-28: 11 [IU] via SUBCUTANEOUS
  Administered 2011-10-28 – 2011-10-29 (×2): 7 [IU] via SUBCUTANEOUS
  Administered 2011-10-29: 3 [IU] via SUBCUTANEOUS
  Administered 2011-10-29: 4 [IU] via SUBCUTANEOUS
  Administered 2011-10-30: 3 [IU] via SUBCUTANEOUS

## 2011-10-28 MED ORDER — INSULIN ASPART 100 UNIT/ML ~~LOC~~ SOLN
0.0000 [IU] | Freq: Every day | SUBCUTANEOUS | Status: DC
  Administered 2011-10-29: 3 [IU] via SUBCUTANEOUS

## 2011-10-28 MED ORDER — RISAQUAD PO CAPS
1.0000 | ORAL_CAPSULE | Freq: Every day | ORAL | Status: DC
  Administered 2011-10-28 – 2011-10-30 (×3): 1 via ORAL
  Filled 2011-10-28 (×3): qty 1

## 2011-10-28 MED ORDER — IOHEXOL 300 MG/ML  SOLN
100.0000 mL | Freq: Once | INTRAMUSCULAR | Status: AC | PRN
  Administered 2011-10-28: 100 mL via INTRAVENOUS

## 2011-10-28 NOTE — Progress Notes (Signed)
Subjective: The patient has had a cramping sensation in his left calf muscle and loose stools this morning.Marland Kitchen His cough is a little less.  He denies chest pain, pleurisy, abdominal pain, diarrhea, or pain with urination.  Objective: Vital signs in last 24 hours: Filed Vitals:   10/27/11 1445 10/27/11 2249 10/28/11 0442 10/28/11 0759  BP: 130/73 133/73 142/81   Pulse: 65 71 66 69  Temp: 98.9 F (37.2 C) 99.7 F (37.6 C) 98.9 F (37.2 C)   TempSrc:  Oral Oral   Resp: 22 21 20    Height:      Weight:      SpO2: 97% 92% 95% 95%    Intake/Output Summary (Last 24 hours) at 10/28/11 1610 Last data filed at 10/27/11 2148  Gross per 24 hour  Intake 1468.33 ml  Output    550 ml  Net 918.33 ml    Weight change:   Physical exam: General: Pleasant obese 71 year old Caucasian man sitting up in a chair , in no acute distress. HEENT: Head is normocephalic, nontraumatic. Oropharynx reveals moist mucous membranes. No posterior exudates or erythema. Lungs: Occasional crackles in the mid lobes bilaterally. No wheezing or difficulty breathing. Heart: S1, S2, with a 2/6 systolic murmur. Abdomen: Obese, positive bowel sounds, soft, nontender, nondistended. Extremities: A few excoriated lesions on his left greater than right legs, scant erythema, no purulence or fluctuance.  He has some tenderness in his left calf muscle, no spasm. He has a trace of pedal edema bilaterally. Neuro/psychiatric: He is alert and oriented x3. Cranial nerves II through XII are intact with exception of mild left facial droop and mild dysarthria.  Lab Results: Basic Metabolic Panel:  Basename 10/28/11 0622 10/27/11 0707  NA 132* 136  K 3.9 3.5  CL 102 103  CO2 22 25  GLUCOSE 222* 189*  BUN 24* 24*  CREATININE 1.16 1.29  CALCIUM 8.5 9.0  MG -- --  PHOS -- --   Liver Function Tests:  [facility] 10/27/11 0707 10/26/11 1252  AST 36 26  ALT 20 19  ALKPHOS 58 81  BILITOT 0.8 0.6  PROT 5.8* 6.9  ALBUMIN 3.2* 4.1      Basename 10/26/11 1252  LIPASE 14  AMYLASE --   No results found for this basename: AMMONIA:2 in the last 72 hours CBC:  Basename 10/28/11 0622 10/27/11 0707 10/26/11 1252  WBC 11.9* 13.7* --  NEUTROABS -- -- 9.2*  HGB 12.6* 13.5 --  HCT 37.7* 39.7 --  MCV 90.8 90.2 --  PLT 149* 154 --   Cardiac Enzymes:  Basename 10/26/11 1252  CKTOTAL --  CKMB --  CKMBINDEX --  TROPONINI <0.30   BNP: No results found for this basename: PROBNP:3 in the last 72 hours D-Dimer: No results found for this basename: DDIMER:2 in the last 72 hours CBG:  Basename 10/28/11 0749 10/27/11 2157 10/27/11 1635 10/27/11 1249 10/27/11 0808 10/26/11 2207  GLUCAP 194* 210* 190* 217* 169* 192*   Hemoglobin A1C:  Basename 10/27/11 0707  HGBA1C 7.7*   Fasting Lipid Panel: No results found for this basename: CHOL,HDL,LDLCALC,TRIG,CHOLHDL,LDLDIRECT in the last 72 hours Thyroid Function Tests: No results found for this basename: TSH,T4TOTAL,FREET4,T3FREE,THYROIDAB in the last 72 hours Anemia Panel: No results found for this basename: VITAMINB12,FOLATE,FERRITIN,TIBC,IRON,RETICCTPCT in the last 72 hours Coagulation:  Basename 10/27/11 0707  LABPROT 17.1*  INR 1.37   Urine Drug Screen: Drugs of Abuse  No results found for this basename: labopia,  cocainscrnur,  labbenz,  amphetmu,  thcu,  labbarb    Alcohol Level: No results found for this basename: ETH:2 in the last 72 hours Urinalysis:  Basename 10/26/11 1427  COLORURINE YELLOW  LABSPEC 1.015  PHURINE 7.5  GLUCOSEU NEGATIVE  HGBUR TRACE*  BILIRUBINUR NEGATIVE  KETONESUR NEGATIVE  PROTEINUR NEGATIVE  UROBILINOGEN 0.2  NITRITE NEGATIVE  LEUKOCYTESUR NEGATIVE   Misc. Labs:   Micro: Recent Results (from the past 240 hour(s))  CULTURE, BLOOD (ROUTINE X 2)     Status: Normal (Preliminary result)   Collection Time   10/26/11  4:15 PM      Component Value Range Status Comment   Specimen Description BLOOD LEFT ARM   Final    Special  Requests BOTTLES DRAWN AEROBIC AND ANAEROBIC 6CC   Final    Culture NO GROWTH 2 DAYS   Final    Report Status PENDING   Incomplete   CULTURE, BLOOD (ROUTINE X 2)     Status: Normal (Preliminary result)   Collection Time   10/26/11  4:30 PM      Component Value Range Status Comment   Specimen Description BLOOD LEFT HAND   Final    Special Requests BOTTLES DRAWN AEROBIC AND ANAEROBIC 6CC   Final    Culture NO GROWTH 2 DAYS   Final    Report Status PENDING   Incomplete   CLOSTRIDIUM DIFFICILE BY PCR     Status: Normal   Collection Time   10/28/11  1:30 AM      Component Value Range Status Comment   C difficile by pcr NEGATIVE  NEGATIVE Final     Studies/Results: Dg Chest 2 View  10/27/2011  *RADIOLOGY REPORT*  Clinical Data: Fever.  Pneumonia.  CHEST - 2 VIEW  Comparison: 10/26/2011  Findings: Low lung volumes again demonstrated with mild bibasilar atelectasis versus scarring.  No evidence of pulmonary air space disease or pleural effusion.  Cardiomegaly is stable, and there is no evidence of congestive heart failure.  IMPRESSION:  1.  Stable low lung volumes with mild bibasilar atelectasis versus scarring. 2.  Stable cardiomegaly.  No evidence of congestive heart failure.  Original Report Authenticated By: Danae Orleans, M.D.   Dg Chest Port 1 View  10/26/2011  *RADIOLOGY REPORT*  Clinical Data: Atrial fibrillation.  PORTABLE CHEST - 1 VIEW  Comparison: PA and lateral chest 05/14/2011.  Findings: There is some basilar atelectasis.  Cardiomegaly again seen.  No pneumothorax or effusion.  IMPRESSION: Cardiomegaly and bibasilar subsegmental atelectasis.  Original Report Authenticated By: Bernadene Bell. Maricela Curet, M.D.    Medications:  Scheduled:    . acidophilus  1 capsule Oral Daily  . amLODipine  2.5 mg Oral Daily  . atorvastatin  40 mg Oral q1800  . benazepril  10 mg Oral Daily  . carvedilol  25 mg Oral BID WC  . dabigatran  150 mg Oral Q12H  . escitalopram  10 mg Oral Daily  . furosemide   10 mg Intravenous Q12H  . insulin aspart  0-15 Units Subcutaneous TID WC  . insulin aspart  0-5 Units Subcutaneous QHS  . insulin glargine  30 Units Subcutaneous QHS  . metroNIDAZOLE  500 mg Oral Q8H  . moxifloxacin  400 mg Oral Q2000  . potassium chloride  20 mEq Oral BID  . sodium chloride      . vancomycin  1,000 mg Intravenous Q12H  . DISCONTD: metronidazole  500 mg Intravenous Q8H  . DISCONTD: moxifloxacin  400 mg Intravenous Q24H   Continuous:    .  0.9 % NaCl with KCl 20 mEq / L 75 mL/hr at 10/28/11 0302   WUJ:WJXBJYNWGNFAO, acetaminophen, albuterol, alum & mag hydroxide-simeth, morphine injection, ondansetron (ZOFRAN) IV, ondansetron  Assessment: Principal Problem:  *Febrile illness, acute Active Problems:  Cough  Atrial fibrillation  DM type 2 (diabetes mellitus, type 2)  H/O: stroke  Diarrhea  Dehydration  Benign hypertension  PNA (pneumonia)     1. Acute febrile illness. Query diarrheal illness/query pneumonia. Although his fever has resolved,  the etiology is unclear at this time. He may have a viral infection or an upper respiratory infection versus lower respiratory infection, although the chest x-ray is not showing a definite infiltrate. The patient has had limited diarrhea, but C. difficile PCR is negative. We'll continue broad-spectrum antibiotic treatment with Avelox, vancomycin, and Flagyl. Blood cultures, urine culture, and [facility] spotted fever titer are pending , Blood cultures are negative so far.Low threshold for starting doxycycline empirically. We'll also consider obtaining a 2-D echocardiogram to rule out gross vegetations. He has no neck rigidity or meningeal signs, and therefore, will not entertain an LP at this time.  Left calf tenderness/cramping. Will order a venous ultrasound to rule out DVT.  Hypertension.  We'll continue to  gradually add back  antihypertensive medications accordingly.  Chronic atrial fibrillation. Status post  cardioversion in April 2013. Currently, the patient is in normal sinus rhythm. We'll continue carvedilol and Pradaxa.  Type 2 diabetes mellitus. His hemoglobin A1c is 7.7. His capillary blood glucose is trending upward. Sliding scale NovoLog accordingly and  Lantus will be adjusted accordingly.  Dehydration. Resolved with IV fluid hydration. He now has mild hyponatremia. He has a little more congestion in his lungs. Will decrease the IV fluids and give him gentle Lasix.  Plan:  1. We'll order a left lower extremity venous ultrasound rule out DVT. 2. We'll order a CT scan of his abdomen and pelvis given the loose stools, which may be antibiotic induced. 3. Will increase sliding scale NovoLog to resistive scale. 4. Ambulate in hallway today.  5. Lasix 10 mg IV Q. 12 hours x24 hours. We'll decrease IV fluids.     LOS: 2 days   , 10/28/2011, 9:24 AM

## 2011-10-28 NOTE — Progress Notes (Signed)
PHARMACIST - PHYSICIAN COMMUNICATION DR:   Sherrie Mustache CONCERNING: Antibiotic IV to Oral Route Change Policy  RECOMMENDATION: This patient is receiving Avelox & Flagyl by the intravenous route.  Based on criteria approved by the Pharmacy and Therapeutics Committee, the antibiotic(s) is/are being converted to the equivalent oral dose form(s).   DESCRIPTION: These criteria include:  Patient being treated for a respiratory tract infection, urinary tract infection, or cellulitis  The patient is not neutropenic and does not exhibit a GI malabsorption state  The patient is eating (either orally or via tube) and/or has been taking other orally administered medications for a least 24 hours  The patient is improving clinically and has a Tmax < 100.5  If you have questions about this conversion, please contact the Pharmacy Department  [x]   [phone] )  [facility] []   [phone] )  [facility]  []   [phone] )  [facility] []   [phone] )  [facility]    Junita Push, PharmD, BCPS 10/28/2011@8 :55 AM

## 2011-10-29 ENCOUNTER — Inpatient Hospital Stay (HOSPITAL_COMMUNITY): Payer: Medicare Other

## 2011-10-29 DIAGNOSIS — I4891 Unspecified atrial fibrillation: Secondary | ICD-10-CM

## 2011-10-29 DIAGNOSIS — L02419 Cutaneous abscess of limb, unspecified: Secondary | ICD-10-CM

## 2011-10-29 DIAGNOSIS — L0390 Cellulitis, unspecified: Secondary | ICD-10-CM | POA: Diagnosis present

## 2011-10-29 DIAGNOSIS — L03116 Cellulitis of left lower limb: Secondary | ICD-10-CM | POA: Diagnosis present

## 2011-10-29 LAB — CBC
HCT: 40.4 % (ref 39.0–52.0)
Hemoglobin: 14 g/dL (ref 13.0–17.0)
MCH: 31.2 pg (ref 26.0–34.0)
MCHC: 34.7 g/dL (ref 30.0–36.0)
MCV: 90 fL (ref 78.0–100.0)
Platelets: 167 10*3/uL (ref 150–400)
RBC: 4.49 MIL/uL (ref 4.22–5.81)
RDW: 12.8 % (ref 11.5–15.5)
WBC: 11.2 10*3/uL — ABNORMAL HIGH (ref 4.0–10.5)

## 2011-10-29 LAB — BASIC METABOLIC PANEL
BUN: 18 mg/dL (ref 6–23)
CO2: 24 mEq/L (ref 19–32)
Calcium: 9 mg/dL (ref 8.4–10.5)
Chloride: 103 mEq/L (ref 96–112)
Creatinine, Ser: 1.08 mg/dL (ref 0.50–1.35)
GFR calc Af Amer: 78 mL/min — ABNORMAL LOW (ref >90)
GFR calc non Af Amer: 68 mL/min — ABNORMAL LOW (ref >90)
Glucose, Bld: 137 mg/dL — ABNORMAL HIGH (ref 70–99)
Potassium: 3.7 mEq/L (ref 3.5–5.1)
Sodium: 137 mEq/L (ref 135–145)

## 2011-10-29 LAB — ROCKY MTN SPOTTED FVR AB, IGG-BLOOD: RMSF IgG: 2.05 IV — ABNORMAL HIGH

## 2011-10-29 LAB — GLUCOSE, CAPILLARY
Glucose-Capillary: 154 mg/dL — ABNORMAL HIGH (ref 70–99)
Glucose-Capillary: 143 mg/dL — ABNORMAL HIGH (ref 70–99)
Glucose-Capillary: 204 mg/dL — ABNORMAL HIGH (ref 70–99)
Glucose-Capillary: 259 mg/dL — ABNORMAL HIGH (ref 70–99)

## 2011-10-29 LAB — VANCOMYCIN, TROUGH: Vancomycin Tr: 11.7 ug/mL (ref 10.0–20.0)

## 2011-10-29 MED ORDER — DOXYCYCLINE HYCLATE 100 MG PO TABS
100.0000 mg | ORAL_TABLET | Freq: Two times a day (BID) | ORAL | Status: DC
  Administered 2011-10-29 – 2011-10-30 (×3): 100 mg via ORAL
  Filled 2011-10-29 (×3): qty 1

## 2011-10-29 MED ORDER — IRBESARTAN 300 MG PO TABS
300.0000 mg | ORAL_TABLET | Freq: Every day | ORAL | Status: DC
  Administered 2011-10-29 – 2011-10-30 (×2): 300 mg via ORAL
  Filled 2011-10-29 (×2): qty 1

## 2011-10-29 MED ORDER — HYDROCHLOROTHIAZIDE 12.5 MG PO CAPS
25.0000 mg | ORAL_CAPSULE | Freq: Every day | ORAL | Status: DC
  Administered 2011-10-29 – 2011-10-30 (×2): 25 mg via ORAL
  Filled 2011-10-29 (×2): qty 2

## 2011-10-29 MED ORDER — VANCOMYCIN HCL 1000 MG IV SOLR
1250.0000 mg | Freq: Two times a day (BID) | INTRAVENOUS | Status: DC
  Administered 2011-10-29: 1250 mg via INTRAVENOUS
  Filled 2011-10-29 (×2): qty 1250

## 2011-10-29 NOTE — Progress Notes (Signed)
UR Chart Review Completed  

## 2011-10-29 NOTE — Progress Notes (Signed)
*  PRELIMINARY RESULTS* Echocardiogram 2D Echocardiogram has been performed.  Caswell Corwin 10/29/2011, 2:20 PM

## 2011-10-29 NOTE — Progress Notes (Signed)
ANTIBIOTIC CONSULT NOTE - INITIAL  Pharmacy Consult for Vancomycin Indication: rule out pneumonia  No Known Allergies  Patient Measurements: Height: 5\' 8"  (172.7 cm) Weight: 227 lb 15.3 oz (103.4 kg) IBW/kg (Calculated) : 68.4   Vital Signs: Temp: 99.9 F (37.7 C) (07/29 0419) Temp src: Oral (07/29 0419) BP: 171/84 mmHg (07/29 0419) Pulse Rate: 66  (07/29 0419) Intake/Output from previous day: 07/28 0701 - 07/29 0700 In: 1100 [P.O.:1100] Out: 1325 [Urine:1325] Intake/Output from this shift:    Labs:  Basename 10/29/11 0457 10/28/11 0622 10/27/11 0707  WBC 11.2* 11.9* 13.7*  HGB 14.0 12.6* 13.5  PLT 167 149* 154  LABCREA -- -- --  CREATININE 1.08 1.16 1.29   Estimated Creatinine Clearance: 74.2 ml/min (by C-G formula based on Cr of 1.08).  Basename 10/29/11 0457  VANCOTROUGH 11.7  VANCOPEAK --  VANCORANDOM --  GENTTROUGH --  GENTPEAK --  GENTRANDOM --  TOBRATROUGH --  TOBRAPEAK --  TOBRARND --  AMIKACINPEAK --  AMIKACINTROU --  AMIKACIN --     Microbiology: Recent Results (from the past 720 hour(s))  URINE CULTURE     Status: Normal   Collection Time   10/26/11  2:27 PM      Component Value Range Status Comment   Specimen Description URINE, CLEAN CATCH   Final    Special Requests NONE   Final    Culture  Setup Time 10/27/2011 01:48   Final    Colony Count NO GROWTH   Final    Culture NO GROWTH   Final    Report Status 10/28/2011 FINAL   Final   CULTURE, BLOOD (ROUTINE X 2)     Status: Normal (Preliminary result)   Collection Time   10/26/11  4:15 PM      Component Value Range Status Comment   Specimen Description BLOOD LEFT ARM   Final    Special Requests BOTTLES DRAWN AEROBIC AND ANAEROBIC 6CC   Final    Culture NO GROWTH 2 DAYS   Final    Report Status PENDING   Incomplete   CULTURE, BLOOD (ROUTINE X 2)     Status: Normal (Preliminary result)   Collection Time   10/26/11  4:30 PM      Component Value Range Status Comment   Specimen Description  BLOOD LEFT HAND   Final    Special Requests BOTTLES DRAWN AEROBIC AND ANAEROBIC 6CC   Final    Culture NO GROWTH 2 DAYS   Final    Report Status PENDING   Incomplete   CLOSTRIDIUM DIFFICILE BY PCR     Status: Normal   Collection Time   10/28/11  1:30 AM      Component Value Range Status Comment   C difficile by pcr NEGATIVE  NEGATIVE Final     Medical History: Past Medical History  Diagnosis Date  . Hypertension   . Diabetes mellitus   . 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 Stroke   . Sleep apnea     cpap  . Shortness of breath     exertion  . Depression   . Atrial fibrillation   . GERD (gastroesophageal reflux disease)   . Obesity     Medications:  Scheduled:     . acidophilus  1 capsule Oral Daily  . amLODipine  2.5 mg Oral Daily  . atorvastatin  40 mg Oral q1800  . benazepril  10 mg Oral Daily  . carvedilol  25 mg Oral BID WC  . dabigatran  150 mg Oral Q12H  . escitalopram  10 mg Oral Daily  . furosemide  10 mg Intravenous BID  . insulin aspart  0-20 Units Subcutaneous TID WC  . insulin aspart  0-5 Units Subcutaneous QHS  . insulin glargine  40 Units Subcutaneous QHS  . metroNIDAZOLE  500 mg Oral Q8H  . moxifloxacin  400 mg Oral Q2000  . potassium chloride  20 mEq Oral BID  . vancomycin  1,000 mg Intravenous Q12H  . DISCONTD: insulin aspart  0-15 Units Subcutaneous TID WC  . DISCONTD: insulin aspart  0-5 Units Subcutaneous QHS  . DISCONTD: insulin glargine  30 Units Subcutaneous QHS  . DISCONTD: metronidazole  500 mg Intravenous Q8H  . DISCONTD: moxifloxacin  400 mg Intravenous Q24H   Assessment: 71 yo M admitted with febrile illness to start broad-spectrum antibiotics to cover for possible PNA vs enteric infection.  Curently on day #4 Vancomcyin, Avelox, Flagyl.  Renal function has improved to baseline. Cx data negative to date.  Vancomycin trough level slightly less than goal.   Goal of Therapy:  Vancomycin trough  level 15-20 mcg/ml  Plan:  1) Increase Vancomycin 1250 mg IV Q12h 2) Check weekly Vancomycin trough & Scr while on Vancomycin 3) Monitor cx data   Elson Clan 10/29/2011,8:39 AM

## 2011-10-29 NOTE — Care Management Note (Unsigned)
    Page 1 of 1   10/29/2011     3:10:56 PM   CARE MANAGEMENT NOTE 10/29/2011  Patient:  [name], [name]   Account Number:  [id number]  Date Initiated:  10/29/2011  Documentation initiated by:  Rosemary Holms  Subjective/Objective Assessment:   Pt admitted from home where he lives with his wife.Independent with ADL. Pt has DM and fever of unknown origin.     Action/Plan:   Spoke to pt at bedside.No HH needs identified at this time   Anticipated DC Date:  10/30/2011   Anticipated DC Plan:  HOME/SELF CARE      DC Planning Services  CM consult      Choice offered to / List presented to:             Status of service:  In process, will continue to follow Medicare Important Message given?   (If response is "NO", the following Medicare IM given date fields will be blank) Date Medicare IM given:   Date Additional Medicare IM given:    Discharge Disposition:    Per UR Regulation:    If discussed at Long Length of Stay Meetings, dates discussed:    Comments:  10/29/11 1100 Romulo Okray Leanord Hawking RN BSN CM

## 2011-10-29 NOTE — Progress Notes (Addendum)
Subjective: The patient says that he still has some discomfort in his left leg. He says that the skin has now turned red. He has no current diarrhea, abdominal pain, or pain with urination. He still has a mild cough which is not productive.  Objective: Vital signs in last 24 hours: Filed Vitals:   10/28/11 1410 10/28/11 2050 10/28/11 2100 10/29/11 0419  BP: 108/70 128/71  171/84  Pulse: 64 57 54 66  Temp: 98.3 F (36.8 C) 98.6 F (37 C)  99.9 F (37.7 C)  TempSrc:  Oral  Oral  Resp: 20 18 20 16   Height:      Weight:      SpO2: 96% 95% 95% 96%    Intake/Output Summary (Last 24 hours) at 10/29/11 1111 Last data filed at 10/29/11 0900  Gross per 24 hour  Intake   1020 ml  Output   1625 ml  Net   -605 ml    Weight change:   Physical exam: General: Pleasant obese 71 year old Caucasian man sitting up in a chair , in no acute distress. Neck: No nuchal rigidity. Lungs: Rare crackles in the mid lobes bilaterally. No wheezing or difficulty breathing. Heart: S1, S2, with a 2/6 systolic murmur. Abdomen: Obese, positive bowel sounds, soft, nontender, nondistended. Extremities: A few excoriated lesions on his left greater than right legs, more generalized erythema, no purulence or fluctuance.  He has some tenderness in his left calf muscle, no spasm. Trace to 1+ left lower extremity edema, no edema on the right lower extremity. Neuro/psychiatric: He is alert and oriented x3. Cranial nerves II through XII are intact with exception of mild left facial droop and mild dysarthria.  Lab Results: Basic Metabolic Panel:  Basename 10/29/11 0457 10/28/11 0622  NA 137 132*  K 3.7 3.9  CL 103 102  CO2 24 22  GLUCOSE 137* 222*  BUN 18 24*  CREATININE 1.08 1.16  CALCIUM 9.0 8.5  MG -- --  PHOS -- --   Liver Function Tests:  [facility] 10/27/11 0707 10/26/11 1252  AST 36 26  ALT 20 19  ALKPHOS 58 81  BILITOT 0.8 0.6  PROT 5.8* 6.9  ALBUMIN 3.2* 4.1    Basename 10/26/11 1252  LIPASE  14  AMYLASE --   No results found for this basename: AMMONIA:2 in the last 72 hours CBC:  Basename 10/29/11 0457 10/28/11 0622 10/26/11 1252  WBC 11.2* 11.9* --  NEUTROABS -- -- 9.2*  HGB 14.0 12.6* --  HCT 40.4 37.7* --  MCV 90.0 90.8 --  PLT 167 149* --   Cardiac Enzymes:  Basename 10/26/11 1252  CKTOTAL --  CKMB --  CKMBINDEX --  TROPONINI <0.30   BNP: No results found for this basename: PROBNP:3 in the last 72 hours D-Dimer: No results found for this basename: DDIMER:2 in the last 72 hours CBG:  Basename 10/29/11 0713 10/28/11 2048 10/28/11 1635 10/28/11 1140 10/28/11 0749 10/27/11 2157  GLUCAP 154* 169* 264* 213* 194* 210*   Hemoglobin A1C:  Basename 10/27/11 0707  HGBA1C 7.7*   Fasting Lipid Panel: No results found for this basename: CHOL,HDL,LDLCALC,TRIG,CHOLHDL,LDLDIRECT in the last 72 hours Thyroid Function Tests: No results found for this basename: TSH,T4TOTAL,FREET4,T3FREE,THYROIDAB in the last 72 hours Anemia Panel: No results found for this basename: VITAMINB12,FOLATE,FERRITIN,TIBC,IRON,RETICCTPCT in the last 72 hours Coagulation:  Basename 10/27/11 0707  LABPROT 17.1*  INR 1.37   Urine Drug Screen: Drugs of Abuse  No results found for this basename: labopia,  cocainscrnur,  labbenz,  amphetmu,  thcu,  labbarb    Alcohol Level: No results found for this basename: ETH:2 in the last 72 hours Urinalysis:  Basename 10/26/11 1427  COLORURINE YELLOW  LABSPEC 1.015  PHURINE 7.5  GLUCOSEU NEGATIVE  HGBUR TRACE*  BILIRUBINUR NEGATIVE  KETONESUR NEGATIVE  PROTEINUR NEGATIVE  UROBILINOGEN 0.2  NITRITE NEGATIVE  LEUKOCYTESUR NEGATIVE   Misc. Labs:   Micro: Recent Results (from the past 240 hour(s))  URINE CULTURE     Status: Normal   Collection Time   10/26/11  2:27 PM      Component Value Range Status Comment   Specimen Description URINE, CLEAN CATCH   Final    Special Requests NONE   Final    Culture  Setup Time 10/27/2011 01:48    Final    Colony Count NO GROWTH   Final    Culture NO GROWTH   Final    Report Status 10/28/2011 FINAL   Final   CULTURE, BLOOD (ROUTINE X 2)     Status: Normal (Preliminary result)   Collection Time   10/26/11  4:15 PM      Component Value Range Status Comment   Specimen Description BLOOD LEFT ARM   Final    Special Requests BOTTLES DRAWN AEROBIC AND ANAEROBIC 6CC   Final    Culture NO GROWTH 3 DAYS   Final    Report Status PENDING   Incomplete   CULTURE, BLOOD (ROUTINE X 2)     Status: Normal (Preliminary result)   Collection Time   10/26/11  4:30 PM      Component Value Range Status Comment   Specimen Description BLOOD LEFT HAND   Final    Special Requests BOTTLES DRAWN AEROBIC AND ANAEROBIC 6CC   Final    Culture NO GROWTH 3 DAYS   Final    Report Status PENDING   Incomplete   CLOSTRIDIUM DIFFICILE BY PCR     Status: Normal   Collection Time   10/28/11  1:30 AM      Component Value Range Status Comment   C difficile by pcr NEGATIVE  NEGATIVE Final     Studies/Results: Ct Abdomen Pelvis W Contrast  10/28/2011  *RADIOLOGY REPORT*  Clinical Data: 71 year old male with abdominal and pelvic pain, fever and diarrhea.  CT ABDOMEN AND PELVIS WITH CONTRAST  Technique:  Multidetector CT imaging of the abdomen and pelvis was performed following the standard protocol during bolus administration of intravenous contrast.  Contrast:  100 ml intravenous Omnipaque-300  Comparison: None  Findings: Cardiomegaly and heavy coronary artery calcifications are noted.  Trace bilateral pleural effusions are identified.  The liver, spleen, pancreas and adrenal glands are unremarkable. Mild bilateral renal cortical atrophy is present with perinephric stranding of uncertain chronicity. Small gallstones versus sludge noted.  There is no CT evidence of acute cholecystitis.  No free fluid, enlarged lymph nodes, biliary dilation or abdominal aortic aneurysm identified.  The bowel and bladder are within normal limits.   Mild subcutaneous edema is noted.  No acute or suspicious bony abnormalities are identified. Degenerative changes throughout the lumbar spine noted. 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  IMPRESSION: Mild bilateral perinephric stranding of uncertain chronicity - likely related to inflammation and most likely chronic. Correlate with any renal abnormalities.  Trace bilateral pleural effusions and mild subcutaneous edema.  Small gallstones versus gallbladder sludge.  No CT evidence of acute cholecystitis.  Cardiomegaly and coronary disease.  Original Report Authenticated  By: JEFFREY T. HU, M.D.   US Venous Img Lower Unilateral Left  10/28/2011  *RADIOLOGY REPORT*  Clinical Data: Left lower extremity swelling and pain.  LEFT LOWER EXTREMITY VENOUS DUPLEX ULTRASOUND  Technique:  Gray-scale sonography with graded compression, as well as color Doppler and duplex ultrasound were performed to evaluate the deep venous system of the lower extremity from the level of the common femoral vein through the popliteal and proximal calf veins. Spectral Doppler was utilized to evaluate flow at rest and with distal augmentation maneuvers.  Comparison:  None.  Findings:  Normal compressibility of the common femoral, superficial femoral, and popliteal veins is demonstrated, as well as the visualized proximal calf veins.  No filling defects to suggest DVT on grayscale or color Doppler imaging.  Doppler waveforms show normal direction of venous flow, normal respiratory phasicity and response to augmentation.  Subcutaneous edema is noted.  IMPRESSION: No evidence of left lower extremity deep vein thrombosis.  Subcutaneous edema.  Original Report Authenticated By: Rosendo Gros, M.D.    Medications:  Scheduled:    . acidophilus  1 capsule Oral Daily  . amLODipine  2.5 mg Oral Daily  . atorvastatin  40 mg Oral q1800  . benazepril  10 mg Oral Daily  . carvedilol  25 mg Oral BID WC  .  dabigatran  150 mg Oral Q12H  . doxycycline  100 mg Oral Q12H  . escitalopram  10 mg Oral Daily  . hydrochlorothiazide  25 mg Oral Daily  . insulin aspart  0-20 Units Subcutaneous TID WC  . insulin aspart  0-5 Units Subcutaneous QHS  . insulin glargine  40 Units Subcutaneous QHS  . irbesartan  300 mg Oral Daily  . moxifloxacin  400 mg Oral Q2000  . potassium chloride  20 mEq Oral BID  . vancomycin  1,250 mg Intravenous Q12H  . DISCONTD: furosemide  10 mg Intravenous BID  . DISCONTD: metroNIDAZOLE  500 mg Oral Q8H  . DISCONTD: vancomycin  1,000 mg Intravenous Q12H   Continuous:    . 0.9 % NaCl with KCl 20 mEq / L 10 mL/hr (10/28/11 0929)   WUJ:[id number], acetaminophen, albuterol, alum & mag hydroxide-simeth, iohexol, morphine injection, ondansetron (ZOFRAN) IV, ondansetron  Assessment: Principal Problem:  *Febrile illness, acute Active Problems:  Cough  Atrial fibrillation  DM type 2 (diabetes mellitus, type 2)  H/O: stroke  Diarrhea  Dehydration  Benign hypertension  PNA (pneumonia)  Anemia  Thrombocytopenia  Cellulitis of left leg     1. Acute febrile illness. Query diarrheal illness/query pneumonia. Although he has defervesced, he did have a low-grade elevation in his temperature overnight. The etiology is unclear at this time. The CT scan of his abdomen revealed perinephric stranding of unknown for this today or etiology. His urine culture is negative. He has no particular a lateral CVA tenderness. He may have a viral infection or an upper respiratory infection versus lower respiratory infection, although the chest x-ray is not showing a definite infiltrate. The patient has had limited diarrhea, but C. difficile PCR is negative. [facility] spotted fever titer is pending , Blood cultures are negative so far. He has no neck rigidity or meningeal signs, and therefore, will not entertain an LP at this time.  Left calf tenderness/cramping. He now has cellulitis. This  could be phlebitis. Venous ultrasound negative for DVT but it did reveal subcutaneous edema. He was given Lasix twice yesterday. He has no edema on his right lower extremity.  Hypertension.  We'll continue to  gradually add back  antihypertensive medications accordingly.  Chronic atrial fibrillation. Status post cardioversion in April 2013. Currently, the patient is in normal sinus rhythm. We'll continue carvedilol and Pradaxa.  Type 2 diabetes mellitus. His hemoglobin A1c is 7.7. His capillary blood glucoses better with adjustment and sliding-scale NovoLog and Lantus.  Dehydration. Resolved with IV fluid hydration. He developed mild hyponatremia. He had a little more congestion in his lungs. Gentle IV Lasix was given yesterday. His hyponatremia has resolved.  Plan: 1. Order CT of his chest, noncontrasted and 2-D echocardiogram for further evaluation. 2. Add doxycycline empirically. Awaiting the results on the right amount spotted fever titer. 3. Continue supportive treatment. 4. Restart there are start an and HCTZ for treatment of chronic hypertension.     LOS: 3 days   , 10/29/2011, 11:11 AM

## 2011-10-29 NOTE — Progress Notes (Signed)
Inpatient Diabetes Program Recommendations  AACE/ADA: New Consensus Statement on Inpatient Glycemic Control  Target Ranges:  Prepandial:   less than 140 mg/dL      Peak postprandial:   less than 180 mg/dL (1-2 hours)      Critically ill patients:  140 - 180 mg/dL  Pager:  [phone] Hours:  8 am-10pm   Reason for Visit: Patient's endo is Dr. Evlyn Kanner.  Patient takes set meal coverage at home and would benefit from resuming regimen while inpatient.  Inpatient Diabetes Program Recommendations Correction (SSI): Decrease to Moderate Correction Insulin - Meal Coverage: Add home meal coverage:  14 units with breakfast, 17 units with lunch, 20 units with supper  Thank you.  Alfredia Client PhD, RN Diabetes Coordinator  Office:  [phone] Team Pager:  [phone]

## 2011-10-30 ENCOUNTER — Encounter (HOSPITAL_COMMUNITY): Payer: Self-pay | Admitting: Internal Medicine

## 2011-10-30 DIAGNOSIS — A779 Spotted fever, unspecified: Principal | ICD-10-CM

## 2011-10-30 DIAGNOSIS — A770 Spotted fever due to Rickettsia rickettsii: Secondary | ICD-10-CM | POA: Diagnosis present

## 2011-10-30 LAB — GLUCOSE, CAPILLARY
Glucose-Capillary: 145 mg/dL — ABNORMAL HIGH (ref 70–99)
Glucose-Capillary: 249 mg/dL — ABNORMAL HIGH (ref 70–99)

## 2011-10-30 LAB — ROCKY MTN SPOTTED FVR AB, IGM-BLOOD: RMSF IgM: 0.2 IV (ref 0.00–0.89)

## 2011-10-30 LAB — CBC
HCT: 37.8 % — ABNORMAL LOW (ref 39.0–52.0)
Hemoglobin: 13.2 g/dL (ref 13.0–17.0)
MCH: 30.9 pg (ref 26.0–34.0)
MCHC: 34.9 g/dL (ref 30.0–36.0)
MCV: 88.5 fL (ref 78.0–100.0)
Platelets: 182 10*3/uL (ref 150–400)
RBC: 4.27 MIL/uL (ref 4.22–5.81)
RDW: 12.7 % (ref 11.5–15.5)
WBC: 7.2 10*3/uL (ref 4.0–10.5)

## 2011-10-30 MED ORDER — DOXYCYCLINE HYCLATE 100 MG PO TABS: 100.0000 mg | ORAL_TABLET | Freq: Two times a day (BID) | ORAL | Status: AC

## 2011-10-30 NOTE — Discharge Summary (Addendum)
Physician Discharge Summary  [name] ZOX:[id number] DOB: [birth year] DOA: 10/26/2011  PCP: Julian Hy, MD  Admit date: 10/26/2011 Discharge date: 10/30/2011  Recommendations for Outpatient Follow-up:  1. The patient was discharged to home. He will followup with his primary care physician in one week.  Discharge Diagnoses:   1. Febrile illness. Working diagnosis/etiology is a tick borne illness with RMSF.  [facility] spotted fever titer was elevated at 2.05. Blood cultures were negative to date. 2. Mild bilateral perinephric stranding of unknown chronicity, likely related to inflammation and likely chronic per radiologist. Urine culture and a urinalysis were both negative. 3. Cellulitis of the left leg, possibly a manifestation of tickborne illness. 4. Suspected pneumonia, but no manifestation on the CT scan or chest x-ray. 5. Loose stools/diarrhea. C. difficile PCR negative. 6. Dehydration. Resolved with IV fluids. 7. Transient thrombocytopenia. Resolved. 8. Type 2 diabetes mellitus. Hemoglobin A1c 7.7. 9. Mild anemia. Resolved. 10. Hypertension. 11. Chronic atrial fibrillation, status post TEE and cardioversion in April 2013 by Dr. Rennis Golden. The patient is anticoagulated with Pradaxa. 12. 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 13. Cholelithiasis.  Discharge Condition: Improved.  Diet recommendation: Heart healthy in carbohydrate modified.  Wt Readings from Last 3 Encounters:  10/27/11 103.4 kg (227 lb 15.3 oz)  07/12/11 104.327 kg (230 lb)  07/12/11 104.327 kg (230 lb)    History of present illness:  The patient is a 71 year old man with a history significant for atrial fibrillation, status post successful cardioversion in April 2013, 2 previous strokes, diabetes mellitus, and hypertension, who presented to the emergency department on 10/26/2011 with a chief complaint of generalized weakness. In the emergency department, his fever has  gotten as high as 103.75F. Otherwise, he was hemodynamically stable. His lab data were significant for a WBC of 10.9, BUN of 28, normal troponin I., and a urinalysis without wbc's. His chest x-ray revealed cardiomegaly and bibasilar subsegmental atelectasis. He was admitted for further evaluation and management.  Hospital Course:  Blood cultures were ordered in the emergency department. A urine culture was ordered in the emergency department. He was started on him. Antibiotic treatment with Avelox, metronidazole, and vancomycin. Although there was no overt infiltrate on the chest x-ray, the working diagnosis at that time was community acquired pneumonia given his symptomatology of cough and congestion. He was started on metronidazole primarily because of his recent history of loose stools. He was started on gentle IV fluids for hydration. His initial antihypertensive medications were withheld with exception of carvedilol, because of low normal blood pressures. He was otherwise continued on all of his other chronic medications. His diabetes was treated with sliding scale NovoLog and Lantus. For further evaluation, a number of studies were ordered. Of note, [facility] spotted fever titer was ordered as the patient had been recently in his yard gardening. CT of his abdomen and pelvis revealed mild bilateral perinephric stranding of unknown chronicity, likely related to inflammation and most likely chronic per the radiologist. There were also small gallstones without evidence of acute cholecystitis. CT scan of his chest without contrast revealed tiny bilateral pleural effusions with atelectasis in both lower lobes, but no sign of infection or inflammatory process. His 2-D echocardiogram revealed an ejection fraction of 55-60% and grade 2 diastolic dysfunction. Otherwise, there was no evidence of gross vegetations or significant vascular abnormalities. A venous ultrasound of his left leg was ordered as it was  noted to be a little more edematous than the right leg.  The venous ultrasound was negative for DVT. C. difficile PCR was negative.  The patient continued to have a low-grade fever during the first 48 hours of the hospitalization, despite broad-spectrum antibiotic therapy. It was decided that doxycycline would be started empirically and metronidazole would be discontinued when the patient developed a rash over his left leg. The [facility] spotted fever titer had taken several days to be completed. Finally, the result was confirmed and his titer was quite elevated at greater than 2. Therefore, doxycycline was continued and all the other antibiotics were discontinued as the working diagnosis/etiology was that the patient was febrile from a tickborne illness. Following the initiation of doxycycline, his fever completely resolved. His white blood cell count completely normalized. He became symptomatically improved.  Following hydration, he developed some  peripheral edema. He was given two gentle doses of Lasix. IV fluids were eventually discontinued. He remained hemodynamically, but his blood pressure increased, and therefore all of his antihypertensive medications were restarted. Both his hemoglobin and his platelet count fell transiently, but they both normalized prior to discharge. He was discharged on 2 more weeks of doxycycline therapy. He will followup with his primary care provider next week.   Procedures: 2-D echocardiogram  Study Conclusions  - Left ventricle: The cavity size was normal. There was moderate concentric hypertrophy. Systolic function was normal. The estimated ejection fraction was in the range of 55% to 60%. Wall motion was normal; there were no regional wall motion abnormalities. Features are consistent with a pseudonormal left ventricular filling pattern, with concomitant abnormal relaxation and increased filling pressure (grade 2 diastolic dysfunction). Doppler parameters  are consistent with elevated mean left atrial filling pressure. - Aortic valve: There was mild stenosis. Mild regurgitation. Valve area: 1.32cm^2 (Vmax). - Mitral valve: Calcified annulus. Mildly thickened leaflets . - Left atrium: The atrium was moderately dilated. - Pulmonary arteries: Systolic pressure was mildly increased. PA peak pressure: 38mm Hg (S). Transthoracic echocardiography. M-mode, complete 2D, spectral Doppler, and color Doppler. Height: Height: 172.7cm. Height: 68in. Weight: Weight: 103kg. Weight: 226.5lb. Body mass index: BMI: 34.5kg/m^2. Body surface area: BSA: 2.76m^2. Patient status: Inpatient. Location: Bedside.     Consultations:  None   Discharge Exam: Filed Vitals:   10/30/11 0810  BP: 175/92  Pulse: 61  Temp:   Resp:    Filed Vitals:   10/29/11 0419 10/29/11 2107 10/30/11 0504 10/30/11 0810  BP: 171/84 145/79 156/75 175/92  Pulse: 66  56 61  Temp: 99.9 F (37.7 C) 97.5 F (36.4 C) 97.8 F (36.6 C)   TempSrc: Oral Oral Oral   Resp: 16 20 20    Height:      Weight:      SpO2: 96% 96% 95%     General: Pleasant obese 71 year old Caucasian man lying in bed, in no acute distress.  Cardiovascular: S1, S2, with a soft systolic murmur.  Respiratory: clear to auscultation with decreased breath sounds in the bases. Abdomen: Obese, positive bowel sounds, soft, nontender, nondistended. Extremities: Mild erythema on the left leg below the knee and trace of left leg edema. Trace of pedal edema on the right leg. No erythema on the right leg.  Discharge Instructions  Discharge Orders    Future Orders Please Complete By Expires   Diet - low sodium heart healthy      Diet Carb Modified      Increase activity slowly        Medication List  As of 10/30/2011  9:53 AM   TAKE  these medications         amLODipine 2.5 MG tablet   Commonly known as: NORVASC   Take 2.5 mg by mouth daily.      benazepril 10 MG tablet   Commonly known as: LOTENSIN    Take 10 mg by mouth daily.      carvedilol 12.5 MG tablet   Commonly known as: COREG   Take 2 tablets (25 mg total) by mouth 2 (two) times daily with a meal.      dabigatran 150 MG Caps   Commonly known as: PRADAXA   Take 1 capsule (150 mg total) by mouth every 12 (twelve) hours.      doxycycline 100 MG tablet   Commonly known as: VIBRA-TABS   Take 1 tablet (100 mg total) by mouth every 12 (twelve) hours. For 14 more days.      escitalopram 10 MG tablet   Commonly known as: LEXAPRO   Take 10 mg by mouth daily.      ezetimibe 10 MG tablet   Commonly known as: ZETIA   Take 10 mg by mouth daily.      insulin lispro 100 UNIT/ML injection   Commonly known as: HUMALOG   Inject 14-20 Units into the skin 3 (three) times daily before meals. Inject 14 units every morning with breakfast, 17 units with lunch, and 20 units with supper.  Humalog Kwikpen      LANTUS SOLOSTAR 100 UNIT/ML injection   Generic drug: insulin glargine   Inject 32 Units into the skin at bedtime.      polycarbophil 625 MG tablet   Commonly known as: FIBERCON   Take 625 mg by mouth daily.      simvastatin 80 MG tablet   Commonly known as: ZOCOR   Take 80 mg by mouth daily.      TRILIPIX 135 MG capsule   Generic drug: Choline Fenofibrate   Take 135 mg by mouth daily.      valsartan-hydrochlorothiazide 320-25 MG per tablet   Commonly known as: DIOVAN-HCT   Take 1 tablet by mouth daily.           Follow-up Information    Follow up with Julian Hy, MD.   Contact information:   [address] [place] [address] [phone]           The results of significant diagnostics from this hospitalization (including imaging, microbiology, ancillary and laboratory) are listed below for reference.    Significant Diagnostic Studies: Dg Chest 2 View  10/27/2011  *RADIOLOGY REPORT*  Clinical Data: Fever.  Pneumonia.  CHEST - 2 VIEW  Comparison: 10/26/2011  Findings: Low lung volumes  again demonstrated with mild bibasilar atelectasis versus scarring.  No evidence of pulmonary air space disease or pleural effusion.  Cardiomegaly is stable, and there is no evidence of congestive heart failure.  IMPRESSION:  1.  Stable low lung volumes with mild bibasilar atelectasis versus scarring. 2.  Stable cardiomegaly.  No evidence of congestive heart failure.  Original Report Authenticated By: Danae Orleans, M.D.   Ct Chest Wo Contrast  10/29/2011  *RADIOLOGY REPORT*  Clinical Data: Bilateral pleural effusions on recent CT.  Fever.  CT CHEST WITHOUT CONTRAST  Technique:  Multidetector CT imaging of the chest was performed following the standard protocol without IV contrast.  Comparison: CT abdomen pelvis 10/28/2011.  Findings: No pathologically enlarged mediastinal or axillary lymph nodes.  Hilar regions are difficult to definitively evaluate without IV contrast.  Ascending  aorta measures approximately 3.9 cm.  Pulmonary arteries are enlarged, as is the heart. Atherosclerotic calcification of the arterial vasculature, including coronary arteries.  No pericardial effusion.  Image quality is rather degraded by respiratory motion.  Tiny bilateral pleural effusions with atelectasis in both lower lobes. Airway is grossly unremarkable.  Incidental imaging of the upper abdomen shows small stones layering in the gallbladder.  No worrisome lytic or sclerotic lesions. Degenerative changes are seen in the spine.  Flowing anterior osteophytosis in the thoracic spine.  Old right rib fracture.  IMPRESSION:  1.  Tiny bilateral pleural effusions with atelectasis in both lower lobes. 2.  Image quality is rather degraded by respiratory motion, limiting evaluation of the lungs. 3.  Borderline ascending aortic aneurysm.  Pulmonary arterial hypertension.  Extensive coronary artery calcification. 4.  Cholelithiasis.  Original Report Authenticated By: Reyes Ivan, M.D.   Ct Abdomen Pelvis W Contrast  10/28/2011   *RADIOLOGY REPORT*  Clinical Data: 71 year old male with abdominal and pelvic pain, fever and diarrhea.  CT ABDOMEN AND PELVIS WITH CONTRAST  Technique:  Multidetector CT imaging of the abdomen and pelvis was performed following the standard protocol during bolus administration of intravenous contrast.  Contrast:  100 ml intravenous Omnipaque-300  Comparison: None  Findings: Cardiomegaly and heavy coronary artery calcifications are noted.  Trace bilateral pleural effusions are identified.  The liver, spleen, pancreas and adrenal glands are unremarkable. Mild bilateral renal cortical atrophy is present with perinephric stranding of uncertain chronicity. Small gallstones versus sludge noted.  There is no CT evidence of acute cholecystitis.  No free fluid, enlarged lymph nodes, biliary dilation or abdominal aortic aneurysm identified.  The bowel and bladder are within normal limits.  Mild subcutaneous edema is noted.  No acute or suspicious bony abnormalities are identified. Degenerative changes throughout the lumbar spine noted. 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  IMPRESSION: Mild bilateral perinephric stranding of uncertain chronicity - likely related to inflammation and most likely chronic. Correlate with any renal abnormalities.  Trace bilateral pleural effusions and mild subcutaneous edema.  Small gallstones versus gallbladder sludge.  No CT evidence of acute cholecystitis.  Cardiomegaly and coronary disease.  Original Report Authenticated By: Rosendo Gros, M.D.   US Venous Img Lower Unilateral Left  10/28/2011  *RADIOLOGY REPORT*  Clinical Data: Left lower extremity swelling and pain.  LEFT LOWER EXTREMITY VENOUS DUPLEX ULTRASOUND  Technique:  Gray-scale sonography with graded compression, as well as color Doppler and duplex ultrasound were performed to evaluate the deep venous system of the lower extremity from the level of the common femoral vein through the  popliteal and proximal calf veins. Spectral Doppler was utilized to evaluate flow at rest and with distal augmentation maneuvers.  Comparison:  None.  Findings:  Normal compressibility of the common femoral, superficial femoral, and popliteal veins is demonstrated, as well as the visualized proximal calf veins.  No filling defects to suggest DVT on grayscale or color Doppler imaging.  Doppler waveforms show normal direction of venous flow, normal respiratory phasicity and response to augmentation.  Subcutaneous edema is noted.  IMPRESSION: No evidence of left lower extremity deep vein thrombosis.  Subcutaneous edema.  Original Report Authenticated By: Rosendo Gros, M.D.   Dg Chest Port 1 View  10/26/2011  *RADIOLOGY REPORT*  Clinical Data: Atrial fibrillation.  PORTABLE CHEST - 1 VIEW  Comparison: PA and lateral chest 05/14/2011.  Findings: There is some basilar atelectasis.  Cardiomegaly again seen.  No pneumothorax or effusion.  IMPRESSION: Cardiomegaly and bibasilar subsegmental atelectasis.  Original Report Authenticated By: Bernadene Bell. Maricela Curet, M.D.    Microbiology: Recent Results (from the past 240 hour(s))  URINE CULTURE     Status: Normal   Collection Time   10/26/11  2:27 PM      Component Value Range Status Comment   Specimen Description URINE, CLEAN CATCH   Final    Special Requests NONE   Final    Culture  Setup Time 10/27/2011 01:48   Final    Colony Count NO GROWTH   Final    Culture NO GROWTH   Final    Report Status 10/28/2011 FINAL   Final   CULTURE, BLOOD (ROUTINE X 2)     Status: Normal (Preliminary result)   Collection Time   10/26/11  4:15 PM      Component Value Range Status Comment   Specimen Description BLOOD LEFT ARM   Final    Special Requests BOTTLES DRAWN AEROBIC AND ANAEROBIC 6CC   Final    Culture NO GROWTH 3 DAYS   Final    Report Status PENDING   Incomplete   CULTURE, BLOOD (ROUTINE X 2)     Status: Normal (Preliminary result)   Collection Time   10/26/11   4:30 PM      Component Value Range Status Comment   Specimen Description BLOOD LEFT HAND   Final    Special Requests BOTTLES DRAWN AEROBIC AND ANAEROBIC 6CC   Final    Culture NO GROWTH 3 DAYS   Final    Report Status PENDING   Incomplete   CLOSTRIDIUM DIFFICILE BY PCR     Status: Normal   Collection Time   10/28/11  1:30 AM      Component Value Range Status Comment   C difficile by pcr NEGATIVE  NEGATIVE Final      Labs: Basic Metabolic Panel:  Lab 10/29/11 1610 10/28/11 0622 10/27/11 0707 10/26/11 1252  NA 137 132* 136 143  K 3.7 3.9 3.5 3.7  CL 103 102 103 107  CO2 24 22 25 25   GLUCOSE 137* 222* 189* 93  BUN 18 24* 24* 28*  CREATININE 1.08 1.16 1.29 1.25  CALCIUM 9.0 8.5 9.0 10.3  MG -- -- -- --  PHOS -- -- -- --   Liver Function Tests:  Lab 10/27/11 0707 10/26/11 1252  AST 36 26  ALT 20 19  ALKPHOS 58 81  BILITOT 0.8 0.6  PROT 5.8* 6.9  ALBUMIN 3.2* 4.1    Lab 10/26/11 1252  LIPASE 14  AMYLASE --   No results found for this basename: AMMONIA:5 in the last 168 hours CBC:  Lab 10/30/11 0746 10/29/11 0457 10/28/11 0622 10/27/11 0707 10/26/11 1252  WBC 7.2 11.2* 11.9* 13.7* 10.9*  NEUTROABS -- -- -- -- 9.2*  HGB 13.2 14.0 12.6* 13.5 14.6  HCT 37.8* 40.4 37.7* 39.7 42.4  MCV 88.5 90.0 90.8 90.2 89.6  PLT 182 167 149* 154 183   Cardiac Enzymes:  Lab 10/26/11 1252  CKTOTAL --  CKMB --  CKMBINDEX --  TROPONINI <0.30   BNP: BNP (last 3 results) No results found for this basename: PROBNP:3 in the last 8760 hours CBG:  Lab 10/30/11 0719 10/29/11 2102 10/29/11 1701 10/29/11 1111 10/29/11 0713  GLUCAP 145* 259* 204* 143* 154*    Time coordinating discharge: Greater than 35 minutes.  Signed:  ,  [facility] 10/30/2011, 9:53 AM

## 2011-10-30 NOTE — Progress Notes (Signed)
Called pt and notified him of appt with Dr. Rinaldo Cloud office on 11/06/2011 at 10 a.m.  Pt verbalized understanding.

## 2011-10-30 NOTE — Progress Notes (Signed)
Pt and pt's spouse provided with discharge instructions.  Pt and spouse verbalized understanding of discharge orders.  Dr. Rinaldo Cloud office has been called to schedule and appt for follow-up visit.  His nurse is supposed to be calling back with appointment.  Will notify pt of follow-up visit when made.

## 2011-10-31 LAB — CULTURE, BLOOD (ROUTINE X 2)
Culture: NO GROWTH
Culture: NO GROWTH

## 2011-12-26 ENCOUNTER — Emergency Department (HOSPITAL_COMMUNITY): Payer: Medicare Other

## 2011-12-26 ENCOUNTER — Encounter (HOSPITAL_COMMUNITY): Payer: Self-pay | Admitting: Adult Health

## 2011-12-26 ENCOUNTER — Emergency Department (HOSPITAL_COMMUNITY)
Admission: EM | Admit: 2011-12-26 | Discharge: 2011-12-26 | Disposition: A | Discharge status: Home / Self Care | Payer: Medicare Other | Attending: Emergency Medicine | Admitting: Emergency Medicine

## 2011-12-26 DIAGNOSIS — Z79899 Other long term (current) drug therapy: Secondary | ICD-10-CM | POA: Insufficient documentation

## 2011-12-26 DIAGNOSIS — J189 Pneumonia, unspecified organism: Secondary | ICD-10-CM | POA: Insufficient documentation

## 2011-12-26 DIAGNOSIS — E119 Type 2 diabetes mellitus without complications: Secondary | ICD-10-CM | POA: Insufficient documentation

## 2011-12-26 DIAGNOSIS — R609 Edema, unspecified: Secondary | ICD-10-CM | POA: Insufficient documentation

## 2011-12-26 DIAGNOSIS — Z8673 Personal history of transient ischemic attack (TIA), and cerebral infarction without residual deficits: Secondary | ICD-10-CM | POA: Insufficient documentation

## 2011-12-26 DIAGNOSIS — Z794 Long term (current) use of insulin: Secondary | ICD-10-CM | POA: Insufficient documentation

## 2011-12-26 DIAGNOSIS — I10 Essential (primary) hypertension: Secondary | ICD-10-CM | POA: Insufficient documentation

## 2011-12-26 DIAGNOSIS — R4182 Altered mental status, unspecified: Secondary | ICD-10-CM | POA: Insufficient documentation

## 2011-12-26 LAB — CBC
HCT: 42.7 % (ref 39.0–52.0)
Hemoglobin: 14.6 g/dL (ref 13.0–17.0)
MCH: 30.9 pg (ref 26.0–34.0)
MCHC: 34.2 g/dL (ref 30.0–36.0)
MCV: 90.5 fL (ref 78.0–100.0)
Platelets: 235 10*3/uL (ref 150–400)
RBC: 4.72 MIL/uL (ref 4.22–5.81)
RDW: 12.3 % (ref 11.5–15.5)
WBC: 6.3 10*3/uL (ref 4.0–10.5)

## 2011-12-26 LAB — COMPREHENSIVE METABOLIC PANEL
ALT: 19 U/L (ref 0–53)
AST: 21 U/L (ref 0–37)
Albumin: 4 g/dL (ref 3.5–5.2)
Alkaline Phosphatase: 79 U/L (ref 39–117)
BUN: 23 mg/dL (ref 6–23)
CO2: 26 mEq/L (ref 19–32)
Calcium: 10 mg/dL (ref 8.4–10.5)
Chloride: 103 mEq/L (ref 96–112)
Creatinine, Ser: 1.07 mg/dL (ref 0.50–1.35)
GFR calc Af Amer: 79 mL/min — ABNORMAL LOW (ref >90)
GFR calc non Af Amer: 68 mL/min — ABNORMAL LOW (ref >90)
Glucose, Bld: 220 mg/dL — ABNORMAL HIGH (ref 70–99)
Potassium: 4.2 mEq/L (ref 3.5–5.1)
Sodium: 141 mEq/L (ref 135–145)
Total Bilirubin: 0.4 mg/dL (ref 0.3–1.2)
Total Protein: 7 g/dL (ref 6.0–8.3)

## 2011-12-26 LAB — URINALYSIS, ROUTINE W REFLEX MICROSCOPIC
Bilirubin Urine: NEGATIVE
Glucose, UA: >1000 mg/dL — AB
Hgb urine dipstick: NEGATIVE
Ketones, ur: NEGATIVE mg/dL
Leukocytes, UA: NEGATIVE
Nitrite: NEGATIVE
Protein, ur: NEGATIVE mg/dL
Specific Gravity, Urine: 1.037 — ABNORMAL HIGH (ref 1.005–1.030)
Urobilinogen, UA: 0.2 mg/dL (ref 0.0–1.0)
pH: 5.5 (ref 5.0–8.0)

## 2011-12-26 LAB — PROTIME-INR
INR: 0.93 (ref 0.00–1.49)
Prothrombin Time: 12.4 seconds (ref 11.6–15.2)

## 2011-12-26 LAB — DIFFERENTIAL
Basophils Absolute: 0.1 10*3/uL (ref 0.0–0.1)
Basophils Relative: 1 % (ref 0–1)
Eosinophils Absolute: 0.2 10*3/uL (ref 0.0–0.7)
Eosinophils Relative: 3 % (ref 0–5)
Lymphocytes Relative: 31 % (ref 12–46)
Lymphs Abs: 1.9 10*3/uL (ref 0.7–4.0)
Monocytes Absolute: 0.6 10*3/uL (ref 0.1–1.0)
Monocytes Relative: 9 % (ref 3–12)
Neutro Abs: 3.6 10*3/uL (ref 1.7–7.7)
Neutrophils Relative %: 57 % (ref 43–77)

## 2011-12-26 LAB — URINE MICROSCOPIC-ADD ON

## 2011-12-26 LAB — APTT: aPTT: 32 seconds (ref 24–37)

## 2011-12-26 LAB — GLUCOSE, CAPILLARY: Glucose-Capillary: 209 mg/dL — ABNORMAL HIGH (ref 70–99)

## 2011-12-26 LAB — TROPONIN I: Troponin I: <0.3 ng/mL (ref <0.30)

## 2011-12-26 MED ORDER — SODIUM CHLORIDE 0.9 % IV BOLUS (SEPSIS)
1000.0000 mL | Freq: Once | INTRAVENOUS | Status: AC
  Administered 2011-12-26: 1000 mL via INTRAVENOUS

## 2011-12-26 MED ORDER — LEVOFLOXACIN 500 MG PO TABS
500.0000 mg | ORAL_TABLET | Freq: Every day | ORAL | Status: DC
  Administered 2011-12-26: 500 mg via ORAL
  Filled 2011-12-26 (×2): qty 1

## 2011-12-26 MED ORDER — LEVOFLOXACIN 500 MG PO TABS: 500.0000 mg | ORAL_TABLET | Freq: Every day | ORAL | Status: DC

## 2011-12-26 NOTE — ED Provider Notes (Signed)
History     CSN: [id number]  Arrival date & time 12/26/11  1648   First MD Initiated Contact with Patient 12/26/11 2053      Chief Complaint  Patient presents with  . Weakness    (Consider location/radiation/quality/duration/timing/severity/associated sxs/prior treatment) Patient is a 71 y.o. male presenting with weakness and cough. The history is provided by the patient and the spouse.  Weakness The primary symptoms include speech change. Primary symptoms do not include visual change or paresthesias. Episode onset: 4 days ago. The episode lasted 4 days. The symptoms are unchanged. The neurological symptoms are diffuse. Context: states sugar has been elevated to the 200's and new cough.  Additional symptoms include weakness. Additional symptoms do not include leg pain. Medical issues also include cerebral vascular accident. Workup history includes MRI and CT scan.  Cough This is a new problem. Episode onset: 4 days ago. The problem occurs every few minutes. The problem has been gradually worsening. The cough is non-productive. There has been no fever. Pertinent negatives include no chest pain, no ear congestion, no rhinorrhea, no shortness of breath and no wheezing. He has tried nothing for the symptoms. The treatment provided no relief. He is not a smoker.    Past Medical History  Diagnosis Date  . Hypertension   . Diabetes mellitus   . 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 Stroke   . Sleep apnea     cpap  . Shortness of breath     exertion  . Depression   . Atrial fibrillation   . GERD (gastroesophageal reflux disease)   . Obesity   . 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  . Cholelithiasis 10/2011    Per CT. No cholecystitis radiographically.    Past Surgical History  Procedure Date  . Knee surgery   . Tonsillectomy   . Colonoscopy   . Tee without cardioversion 07/12/2011    Procedure: TRANSESOPHAGEAL  ECHOCARDIOGRAM (TEE);  Surgeon: [name], MD;  Location: [facility] ENDOSCOPY;  Service: Cardiovascular;  Laterality: N/A;  to be done at 1330  . Cardioversion 07/12/2011    Procedure: CARDIOVERSION;  Surgeon: [name], MD;  Location: [facility] ENDOSCOPY;  Service: Cardiovascular;  Laterality: N/A;    History reviewed. No pertinent family history.  History  Substance Use Topics  . Smoking status: Never Smoker   . Smokeless tobacco: Not on file  . Alcohol Use: 1.2 oz/week    2 Glasses of wine per week      Review of Systems  HENT: Negative for rhinorrhea.   Respiratory: Positive for cough. Negative for shortness of breath and wheezing.   Cardiovascular: Negative for chest pain.  Neurological: Positive for speech change and weakness. Negative for paresthesias.  All other systems reviewed and are negative.    Allergies  Review of patient's allergies indicates no known allergies.  Home Medications   Current Outpatient Rx  Name Route Sig Dispense Refill  . AMLODIPINE BESYLATE 2.5 MG PO TABS Oral Take 2.5 mg by mouth daily.    Marland Kitchen BENAZEPRIL HCL 10 MG PO TABS Oral Take 10 mg by mouth daily.    Marland Kitchen CARVEDILOL 12.5 MG PO TABS Oral Take 2 tablets (25 mg total) by mouth 2 (two) times daily with a meal.    . CHOLINE FENOFIBRATE 135 MG PO CPDR Oral Take 135 mg by mouth daily.    Marland Kitchen DABIGATRAN ETEXILATE MESYLATE 150 MG PO CAPS  Oral Take 1 capsule (150 mg total) by mouth every 12 (twelve) hours. 60 capsule 5  . ESCITALOPRAM OXALATE 10 MG PO TABS Oral Take 10 mg by mouth daily.    Marland Kitchen EZETIMIBE 10 MG PO TABS Oral Take 10 mg by mouth daily.    . INSULIN GLARGINE 100 UNIT/ML [place] SOLN Subcutaneous Inject 32 Units into the skin at bedtime.    . INSULIN LISPRO (HUMAN) 100 UNIT/ML [place] SOLN Subcutaneous Inject 14-20 Units into the skin 3 (three) times daily before meals. Inject 14 units every morning with breakfast, 17 units with lunch, and 20 units with supper.  Humalog Kwikpen    . CALCIUM POLYCARBOPHIL  625 MG PO TABS Oral Take 625 mg by mouth daily.    Marland Kitchen SIMVASTATIN 80 MG PO TABS Oral Take 80 mg by mouth daily.    Marland Kitchen VALSARTAN-HYDROCHLOROTHIAZIDE 320-25 MG PO TABS Oral Take 1 tablet by mouth daily.      BP 146/73  Pulse 58  Temp 97.9 F (36.6 C) (Oral)  Resp 20  SpO2 98%  Physical Exam  Nursing note and vitals reviewed. Constitutional: He is oriented to person, place, and time. He appears well-developed and well-nourished. No distress.  HENT:  Head: Normocephalic and atraumatic.  Mouth/Throat: Oropharynx is clear and moist.  Eyes: Conjunctivae normal and EOM are normal. Pupils are equal, round, and reactive to light.  Neck: Normal range of motion. Neck supple.  Cardiovascular: Normal rate, regular rhythm and intact distal pulses.   No murmur heard. Pulmonary/Chest: Effort normal and breath sounds normal. No respiratory distress. He has no wheezes. He has no rales.  Abdominal: Soft. He exhibits no distension. There is no tenderness. There is no rebound and no guarding.  Musculoskeletal: Normal range of motion. He exhibits edema. He exhibits no tenderness.  Neurological: He is alert and oriented to person, place, and time.       Weakness in the left upper and lower ext.  Mild left sided facial droop and slow slightly slurred speech.  Denies swallowing problems or choking  Skin: Skin is warm and dry. No rash noted. No erythema.  Psychiatric: He has a normal mood and affect. His behavior is normal.    ED Course  Procedures (including critical care time)  Labs Reviewed  COMPREHENSIVE METABOLIC PANEL - Abnormal; Notable for the following:    Glucose, Bld 220 (*)     GFR calc non Af Amer 68 (*)     GFR calc Af Amer 79 (*)     All other components within normal limits  GLUCOSE, CAPILLARY - Abnormal; Notable for the following:    Glucose-Capillary 209 (*)     All other components within normal limits  URINALYSIS, ROUTINE W REFLEX MICROSCOPIC - Abnormal; Notable for the following:      Specific Gravity, Urine 1.037 (*)     Glucose, UA >1000 (*)     All other components within normal limits  PROTIME-INR  APTT  CBC  DIFFERENTIAL  TROPONIN I  URINE MICROSCOPIC-ADD ON   Dg Chest 2 View  12/26/2011  *RADIOLOGY REPORT*  Clinical Data: 71 year old male cough altered mental status.  CHEST - 2 VIEW  Comparison: 10/29/2011 and earlier.  Findings: Stable lung volumes.  Interval improved lower lobe ventilation.  Stable cardiac size and mediastinal contours. Visualized tracheal air column is within normal limits.  No pneumothorax or pulmonary edema.  Mild residual lower lobe streaky opacity most resembles atelectasis.  No consolidation. Stable visualized osseous structures.  IMPRESSION:  Interval improved lower lobe ventilation with mild residual atelectasis suspected.  No new cardiopulmonary abnormality.   Original Report Authenticated By: Harley Hallmark, M.D.    Ct Head Wo Contrast  12/26/2011  *RADIOLOGY REPORT*  Clinical Data: 72 year old male with pain and weakness on the left. Slurred speech.  CT HEAD WITHOUT CONTRAST  Technique:  Contiguous axial images were obtained from the base of the skull through the vertex without contrast.  Comparison: 06/20/2010.  Findings: Visualized paranasal sinuses and mastoids are clear.  No acute osseous abnormality identified.  Visualized orbits and scalp soft tissues are within normal limits.  Calcified atherosclerosis at the skull base.  Dolichoectasia and extensive calcified atherosclerosis in the posterior circulation again noted.  Stable cerebral volume.  No ventriculomegaly. Interval inferior left frontal lobe cortical infarct with chronic encephalomalacia. Interval left superior frontal gyrus infarct on the left as well with chronic encephalomalacia.  Patchy confluent cerebral white matter hypodensity again noted. Chronic lacunar infarct of the right corona radiata extending to the lentiform nuclei.  Chronic right thalamic lacunar infarct.  No acute  intracranial hemorrhage identified.  No midline shift, mass effect, or evidence of mass lesion.  No evidence of cortically based acute infarction identified.  IMPRESSION: Progression of chronic ischemia since 2012. No acute intracranial abnormality.   Original Report Authenticated By: Harley Hallmark, M.D.     Date: 12/26/2011  Rate: 62  Rhythm: normal sinus rhythm  QRS Axis: normal  Intervals: normal  ST/T Wave abnormalities: nonspecific T wave changes  Conduction Disutrbances:Incomplete right bundle branch block  Narrative Interpretation:   Old EKG Reviewed: not available    No diagnosis found.    MDM   Patient with history of prior shocks in the past who presents today due to persistent slurred speech for the last 4 days and a new cough. Patient was treated for non-spotted fever last night but is currently not on any antibiotics. He states his blood sugars running in the 200s which is high for him. Patient does not appear in any acute distress on exam is in normal sinus rhythm. CT of his head shows no acute intercranial abnormalities which feel as he had a subacute stroke it would be showing up at this time.  CBC, troponin, BMP are all within normal limits. Do to his new cough we'll check a chest x-ray and also get a UA to ensure no sign of infection that is causing him to have slurred speech. Speaking with the patient and his wife he does have slurred speech intermittently when his body is under stress or when he is tired. He denies any new focal weakness.  CXR with small area in the LLL which may be atelectasis however due to new cough, congestion may be early infection and will over with levaquin.  Pt will f/u with PcP.      Gwyneth Sprout, MD 12/26/11 2250

## 2011-12-26 NOTE — Discharge Instructions (Signed)

## 2011-12-26 NOTE — ED Notes (Signed)
Slurred speech, cough, congestion, high blood sugar, weakness that began on Sunday. Pt has residual left sided weakness from a previous stroke. No facial droop or arm drift, speech is slurred and pt is slow to respond to questions but answers questions appropriately, follows commands. Within the last month pt has been antibiotics for [place] spotted fever.

## 2012-01-04 ENCOUNTER — Other Ambulatory Visit: Payer: Self-pay | Admitting: Family Medicine

## 2012-01-04 ENCOUNTER — Ambulatory Visit
Admission: RE | Admit: 2012-01-04 | Discharge: 2012-01-04 | Disposition: A | Discharge status: Home / Self Care | Payer: Medicare Other | Source: Ambulatory Visit | Attending: Family Medicine | Admitting: Family Medicine

## 2012-01-04 DIAGNOSIS — R1310 Dysphagia, unspecified: Secondary | ICD-10-CM

## 2012-01-31 ENCOUNTER — Ambulatory Visit (HOSPITAL_COMMUNITY)
Admission: RE | Admit: 2012-01-31 | Discharge: 2012-01-31 | Disposition: A | Discharge status: Home / Self Care | Payer: Medicare Other | Source: Ambulatory Visit | Attending: Urology | Admitting: Urology

## 2012-01-31 DIAGNOSIS — R4789 Other speech disturbances: Secondary | ICD-10-CM

## 2012-01-31 DIAGNOSIS — I4891 Unspecified atrial fibrillation: Secondary | ICD-10-CM

## 2012-01-31 DIAGNOSIS — I6789 Other cerebrovascular disease: Secondary | ICD-10-CM

## 2012-01-31 DIAGNOSIS — Z8673 Personal history of transient ischemic attack (TIA), and cerebral infarction without residual deficits: Secondary | ICD-10-CM

## 2012-01-31 DIAGNOSIS — I6350 Cerebral infarction due to unspecified occlusion or stenosis of unspecified cerebral artery: Secondary | ICD-10-CM

## 2012-01-31 DIAGNOSIS — I10 Essential (primary) hypertension: Secondary | ICD-10-CM

## 2012-01-31 DIAGNOSIS — E785 Hyperlipidemia, unspecified: Secondary | ICD-10-CM

## 2012-01-31 DIAGNOSIS — E119 Type 2 diabetes mellitus without complications: Secondary | ICD-10-CM | POA: Insufficient documentation

## 2012-01-31 HISTORY — PX: DOPPLER ECHOCARDIOGRAPHY: SHX263

## 2012-01-31 HISTORY — PX: OTHER SURGICAL HISTORY: SHX169

## 2012-01-31 NOTE — Progress Notes (Signed)
  Echocardiogram 2D Echocardiogram has been performed.  [place] 01/31/2012, 12:53 PM

## 2012-01-31 NOTE — Progress Notes (Signed)
VASCULAR LAB PRELIMINARY  PRELIMINARY  PRELIMINARY  PRELIMINARY  Carotid duplex completed.    Preliminary report:  Bilateral:  No evidence of hemodynamically significant internal carotid artery stenosis.   Vertebral artery flow is antegrade.     , , RVS 01/31/2012, 10:27 AM

## 2012-04-29 ENCOUNTER — Ambulatory Visit (HOSPITAL_COMMUNITY)
Admission: RE | Admit: 2012-04-29 | Discharge: 2012-04-29 | Disposition: A | Discharge status: Home / Self Care | Payer: Medicare Other | Source: Ambulatory Visit | Attending: Neurology | Admitting: Neurology

## 2012-04-29 ENCOUNTER — Ambulatory Visit (HOSPITAL_COMMUNITY)
Admission: RE | Admit: 2012-04-29 | Discharge: 2012-04-29 | Disposition: A | Discharge status: Home / Self Care | Payer: Medicare Other | Source: Ambulatory Visit | Attending: Endocrinology | Admitting: Endocrinology

## 2012-04-29 DIAGNOSIS — R1313 Dysphagia, pharyngeal phase: Secondary | ICD-10-CM | POA: Insufficient documentation

## 2012-04-29 DIAGNOSIS — Z8673 Personal history of transient ischemic attack (TIA), and cerebral infarction without residual deficits: Secondary | ICD-10-CM | POA: Insufficient documentation

## 2012-04-29 DIAGNOSIS — R05 Cough: Secondary | ICD-10-CM | POA: Insufficient documentation

## 2012-04-29 DIAGNOSIS — R1311 Dysphagia, oral phase: Secondary | ICD-10-CM | POA: Insufficient documentation

## 2012-04-29 DIAGNOSIS — I69991 Dysphagia following unspecified cerebrovascular disease: Secondary | ICD-10-CM | POA: Insufficient documentation

## 2012-04-29 DIAGNOSIS — R1310 Dysphagia, unspecified: Secondary | ICD-10-CM

## 2012-04-29 DIAGNOSIS — R059 Cough, unspecified: Secondary | ICD-10-CM | POA: Insufficient documentation

## 2012-04-29 DIAGNOSIS — I639 Cerebral infarction, unspecified: Secondary | ICD-10-CM

## 2012-04-29 NOTE — Procedures (Signed)
Objective Swallowing Evaluation: Modified Barium Swallowing Study  Patient Details  Name: [name] MRN: [id number] Date of Birth: Sep 20, 1940  Today's Date: 04/29/2012 Time: 4098-1191 SLP Time Calculation (min): 33 min  Past Medical History:  Past Medical History  Diagnosis Date  . Hypertension   . Diabetes mellitus   . 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 Stroke   . Sleep apnea     cpap  . Shortness of breath     exertion  . Depression   . Atrial fibrillation   . GERD (gastroesophageal reflux disease)   . Obesity   . 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  . Cholelithiasis 10/2011    Per CT. No cholecystitis radiographically.   Past Surgical History:  Past Surgical History  Procedure Date  . Knee surgery   . Tonsillectomy   . Colonoscopy   . Tee without cardioversion 07/12/2011    Procedure: TRANSESOPHAGEAL ECHOCARDIOGRAM (TEE);  Surgeon: [name], MD;  Location: [facility] ENDOSCOPY;  Service: Cardiovascular;  Laterality: N/A;  to be done at 1330  . Cardioversion 07/12/2011    Procedure: CARDIOVERSION;  Surgeon: [name], MD;  Location: [facility] ENDOSCOPY;  Service: Cardiovascular;  Laterality: N/A;   HPI:  Pt is a 72 year old male arriving for an outpatient MBS due to concerns from MD that pt may be aspirating. The pt reports that he has had two strokes int he past, both right CVAs affecting his left side. he reports that these strokes resulted in a minor change in speech that resolved. He says he has had had a chronic cough since the first stroke about 3 years ago. The pt also reports that a few months ago he woke up and his speech was different. He reports that he has been checked for a stroke with no positive results and also reports he has seen a neurologist. 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      Assessment / Plan /  Recommendation Clinical Impression  Dysphagia Diagnosis: Moderate pharyngeal phase dysphagia;Mild oral phase dysphagia  Clinical impression: Pt presents with motor and sensory deficits concerning for a progressive neuromotor disorder. Today the pt exhibits no focal weakness, no unilateral change in oral motor function (base/tip of tongue, lips, velum) though speed an accuracy of articulation is slow and resonance is abnormal. In a diadochokinetic task the pt clearly struggled with fine motor speech movements.   Oral function with PO was not severely impaired though pt did manage bolus slowly and struggled with base of tongue elevation for bolus containment prior to postior transit of bolus to pharynx. Pharyngeal function was moderately impaired with similar appearance of adequate strength with poor rate of initiation of movement and coordination of complex movement. Thin and nectar thick liquids intermittently fall to the pyriforms and into the airway before and during the swallow without any sensation. Aspiration is moderate and completely silent. There is not any signficant residual. With a chin tuck the pt continues to penetrate/aspirate. Only with cues to take small sips and pause to orally hold the bolus, tuck his chin and then swallow doe the pt achieve adequate airway closure before the swallow to prevent most instances of aspiration. This strategy will reduce but not prevent aspiration, which SLP discussed with pt. Hopefully, the pt has tolerated aspiration moderately well and will benefit from a reduction of severity of aspiration events. At  this time the pt is recommended to continue a regular diet and thin liquids and follow aforementioned precautions to reduce risk.   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 There is a possiblity that pts function may worsen with time and aspiration risk will increase. This was discussed with the  pts primary care physician Dr. Evlyn Kanner who will take necessary measures to refer to neurologist and outpatient SLP.     Treatment Recommendation  Defer treatment plan to SLP at (Comment) (outpatient)    Diet Recommendation Regular;Thin liquid   Liquid Administration via: Cup;No straw Medication Administration: Whole meds with puree Supervision: Patient able to self feed Compensations: Slow rate;Small sips/bites Postural Changes and/or Swallow Maneuvers: Chin tuck    Other  Recommendations Recommended Consults: Other (Comment) (Neurology referral )   Follow Up Recommendations  Outpatient SLP    Frequency and Duration        Pertinent Vitals/Pain NA    SLP Swallow Goals     General HPI: Pt is a 72 year old male arriving for an outpatient MBS due to concerns from MD that pt may be aspirating. The pt reports that he has had two strokes int he past, both right CVAs affecting his left side. he reports that these strokes resulted in a minor change in speech that resolved. He says he has had had a chronic cough since the first stroke about 3 years ago. The pt also reports that a few months ago he woke up and his speech was different. He reports that he has been checked for a stroke with no positive results and also reports he has seen a neurologist. 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  Type of Study: Modified Barium Swallowing Study Reason for Referral: Objectively evaluate swallowing function Diet Prior to this Study: Regular;Thin liquids Temperature Spikes Noted: N/A Respiratory Status: Room air History of Recent Intubation: No Behavior/Cognition: Alert;Cooperative;Pleasant mood Oral Cavity - Dentition: Adequate natural dentition Oral Motor / Sensory Function: Impaired motor Oral impairment: Other (Comment) (sluggish movement see impression statement) Self-Feeding Abilities: Able to feed self Patient Positioning: Upright in  chair Baseline Vocal Quality: Clear Volitional Cough: Strong Volitional Swallow: Able to elicit Anatomy: Within functional limits Pharyngeal Secretions: Not observed secondary MBS    Reason for Referral Objectively evaluate swallowing function   Oral Phase Oral Preparation/Oral Phase Oral Phase: Impaired Oral - Thin Oral - Thin Cup:  (decreased bolus cohesion/base of tongue contact) Oral - Thin Straw: Other (Comment) (decreased bolus cohesion/base of tongue contact) Oral - Solids Oral - Puree: Within functional limits Oral - Regular: Within functional limits Oral - Pill: Within functional limits   Pharyngeal Phase Pharyngeal Phase Pharyngeal Phase: Impaired Pharyngeal - Nectar Pharyngeal - Nectar Cup: Delayed swallow initiation;Premature spillage to pyriform sinuses;Penetration/Aspiration during swallow;Reduced airway/laryngeal closure;Reduced epiglottic inversion;Trace aspiration Penetration/Aspiration details (nectar cup): Material enters airway, passes BELOW cords without attempt by patient to eject out (silent aspiration) Pharyngeal - Thin Pharyngeal - Thin Cup: Premature spillage to pyriform sinuses;Delayed swallow initiation;Reduced airway/laryngeal closure;Penetration/Aspiration during swallow;Penetration/Aspiration before swallow;Moderate aspiration (improved airway closure with a 'sip, hold, tuck' strategy) Penetration/Aspiration details (thin cup): Material enters airway, passes BELOW cords without attempt by patient to eject out (silent aspiration);Material does not enter airway Pharyngeal - Thin Straw: Reduced airway/laryngeal closure;Premature spillage to pyriform sinuses;Delayed swallow initiation;Penetration/Aspiration before swallow;Penetration/Aspiration during swallow;Moderate aspiration Penetration/Aspiration details (thin straw): Material enters airway, passes BELOW cords without attempt by patient  to eject out (silent aspiration) Pharyngeal - Solids Pharyngeal -  Puree: Delayed swallow initiation;Premature spillage to valleculae Pharyngeal - Mechanical Soft: Delayed swallow initiation;Premature spillage to valleculae Pharyngeal - Pill: Delayed swallow initiation;Premature spillage to valleculae  Cervical Esophageal Phase    GO    Cervical Esophageal Phase Cervical Esophageal Phase: [facility]    Functional Assessment Tool Used: clinical judgement Functional Limitations: Swallowing Swallow Current Status ([id number]): At least 40 percent but less than 60 percent impaired, limited or restricted Swallow Goal Status [phone]): At least 20 percent but less than 40 percent impaired, limited or restricted Swallow Discharge Status [phone]): At least 20 percent but less than 40 percent impaired, limited or restricted    , Riley Nearing 04/29/2012, 1:50 PM

## 2012-05-09 ENCOUNTER — Ambulatory Visit: Payer: Medicare Other

## 2012-05-22 ENCOUNTER — Ambulatory Visit (HOSPITAL_COMMUNITY)
Admission: RE | Admit: 2012-05-22 | Discharge: 2012-05-22 | Disposition: A | Discharge status: Home / Self Care | Payer: Medicare Other | Source: Ambulatory Visit | Attending: Endocrinology | Admitting: Endocrinology

## 2012-05-22 DIAGNOSIS — I10 Essential (primary) hypertension: Secondary | ICD-10-CM | POA: Insufficient documentation

## 2012-05-22 DIAGNOSIS — E119 Type 2 diabetes mellitus without complications: Secondary | ICD-10-CM | POA: Insufficient documentation

## 2012-05-22 DIAGNOSIS — R1310 Dysphagia, unspecified: Secondary | ICD-10-CM | POA: Insufficient documentation

## 2012-05-22 DIAGNOSIS — IMO0001 Reserved for inherently not codable concepts without codable children: Secondary | ICD-10-CM | POA: Insufficient documentation

## 2012-05-27 ENCOUNTER — Ambulatory Visit (HOSPITAL_COMMUNITY)
Admission: RE | Admit: 2012-05-27 | Discharge: 2012-05-27 | Disposition: A | Discharge status: Home / Self Care | Payer: Medicare Other | Source: Ambulatory Visit | Attending: Endocrinology | Admitting: Endocrinology

## 2012-05-27 NOTE — Progress Notes (Signed)
Speech Language Pathology Treatment Patient Details  Name: [name] MRN: [id number] Date of Birth: Dec 13, 1940  Today's Date: 05/26/2012 Time:  - 11:00 AM    Authorization: [place] [facility] medicare  Authorization Time Period:    Authorization Visit#:   of     HPI: See MBSS      Assessments Cognition Overall Cognitive Status: Appears within functional limits for tasks assessed Auditory Comprehension Overall Auditory Comprehension: Appears within functional limits for tasks assessed Expression Primary Mode of Expression: Verbal Verbal Expression Overall Verbal Expression: Impaired (spastic dysarthria) Interfering Components: Speech intelligibility Non-Verbal Means of Communication: Not applicable Oral Motor/Sensory Function Overall Oral Motor/Sensory Function: Appears within functional limits for tasks assessed (decreased coordination with repetitive and fine motor skills) Velum:  (reduced) Motor Speech Overall Motor Speech: Impaired at baseline Respiration: Impaired Level of Impairment: Conversation Phonation: Normal Resonance: Hyponasality Articulation: Impaired Level of Impairment: Conversation Intelligibility: Intelligibility reduced Conversation: 75-100% accurate Motor Planning: Witnin functional limits Interfering Components: Premorbid status Effective Techniques: Over-articulate  Treatment  Dysphagia Therapy Oral Motor Exercises Patient/Family Education Home Exercise Program  SLP Goals    Pt will implement compensatory swallowing strategies when drinking liquids 95% of the time with written/indirect cues.   SLP Short Term Goal 2: Pt will verbally state specific compensatory strategies with 100% acc independently.   SLP Short Term Goal 3: Pt will complete oral motor and pharyngeal swallowing exercises with use of written cue as needed.    Assessment/Plan  Patient Active Problem List  Diagnosis  . Atrial fibrillation  . DM type 2 (diabetes mellitus,  type 2)  . Hyperlipemia  . Obesity  . Benign hypertension  . H/O: stroke  . Febrile illness, acute  . Cough  . Diarrhea  . Dehydration  . PNA (pneumonia)  . Anemia  . Thrombocytopenia  . Cellulitis of left leg  . RMSF [facility] spotted fever)         GN- Initial: CJ, Goal: CI    Thank you,  Havery Moros, CCC-SLP [phone]   , 05/26/2012, 7:43 PM

## 2012-05-27 NOTE — Progress Notes (Signed)
Speech Language Pathology Treatment Patient Details  Name: BINGHAM MILLETTE MRN: [id number] Date of Birth: 09-13-1940  Today's Date: 05/27/2012 Time: 1030-1125 SLP Time Calculation (min): 55 min  Authorization: [employer]  Authorization Time Period:    Authorization Visit#:  2 of   4  HPI:  Symptoms/Limitations Symptoms: Still has "fits' of coughing from time to time. Pain Assessment Currently in Pain?: No/denies    Treatment  Dysphagia Therapy Oral Motor Exercises Patient/Family Education Home Exercise Program  SLP Goals  Home Exercise SLP Goal: Patient will Perform Home Exercise Program: Independently SLP Goal: Perform Home Exercise Program - Progress: Progressing toward goal SLP Short Term Goals SLP Short Term Goal 1: Pt will implement compensatory swallowing strategies when drinking liquids 95% of the time with written/indirect cues. SLP Short Term Goal 1 - Progress: Progressing toward goal SLP Short Term Goal 2: Pt will verbally state specific compensatory strategies with 100% acc independently. SLP Short Term Goal 2 - Progress: Progressing toward goal SLP Short Term Goal 3: Pt will complete oral motor and pharyngeal swallowing exercises with use of written cue as needed.  SLP Short Term Goal 3 - Progress: Progressing toward goal SLP Long Term Goals SLP Long Term Goal 1: Same as short  Assessment/Plan  Patient Active Problem List  Diagnosis  . Atrial fibrillation  . DM type 2 (diabetes mellitus, type 2)  . Hyperlipemia  . Obesity  . Benign hypertension  . H/O: stroke  . Febrile illness, acute  . Cough  . Diarrhea  . Dehydration  . PNA (pneumonia)  . Anemia  . Thrombocytopenia  . Cellulitis of left leg  . 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 Hornbaker has a hard time remembering to implement compensatory swallow sequence (small sip, hold liquid in mouth, chin down, and swallow). He is able to demonstrate, but frequently puts his chin down after his swallow. He was provided with written cue card to keep near him when he eats. His wife is good about reminding him as well.  Speech Therapy Frequency: min 1 x/week Duration: 2 weeks (3 weeks) Treatment/Interventions: Aspiration precaution training;Pharyngeal strengthening exercises;Diet toleration management by SLP;Compensatory techniques;SLP instruction and feedback;Patient/family education Potential to Achieve Goals: Good Potential Considerations:  (time since onset)     , 05/27/2012, 7:54 PM

## 2012-06-03 ENCOUNTER — Ambulatory Visit (HOSPITAL_COMMUNITY)
Admission: RE | Admit: 2012-06-03 | Discharge: 2012-06-03 | Disposition: A | Discharge status: Home / Self Care | Payer: Medicare Other | Source: Ambulatory Visit | Attending: Endocrinology | Admitting: Endocrinology

## 2012-06-03 DIAGNOSIS — I10 Essential (primary) hypertension: Secondary | ICD-10-CM | POA: Insufficient documentation

## 2012-06-03 DIAGNOSIS — E119 Type 2 diabetes mellitus without complications: Secondary | ICD-10-CM | POA: Insufficient documentation

## 2012-06-03 DIAGNOSIS — IMO0001 Reserved for inherently not codable concepts without codable children: Secondary | ICD-10-CM | POA: Insufficient documentation

## 2012-06-03 DIAGNOSIS — R1310 Dysphagia, unspecified: Secondary | ICD-10-CM | POA: Insufficient documentation

## 2012-06-05 NOTE — Progress Notes (Signed)
Speech Language Pathology Treatment Patient Details  Name: BRYNN REZNIK MRN: [id number] Date of Birth: 1940/10/31  Today's Date: 06/03/2012 Time: 1420-1500 SLP Time Calculation (min): 40 min  Authorization: [employer]  Authorization Time Period:    Authorization Visit#:   of     HPI:  Symptoms/Limitations Symptoms: Doing well. Pain Assessment Currently in Pain?: No/denies    Treatment  Dysphagia Therapy Oral Motor Exercises Patient/Family Education Home Exercise Program  SLP Goals  Home Exercise SLP Goal: Patient will Perform Home Exercise Program: Independently SLP Goal: Perform Home Exercise Program - Progress: Met SLP Short Term Goals SLP Short Term Goal 1: Pt will implement compensatory swallowing strategies when drinking liquids 95% of the time with written/indirect cues. SLP Short Term Goal 1 - Progress: Met SLP Short Term Goal 2: Pt will verbally state specific compensatory strategies with 100% acc independently. SLP Short Term Goal 2 - Progress: Met SLP Short Term Goal 3: Pt will complete oral motor and pharyngeal swallowing exercises with use of written cue as needed.  SLP Short Term Goal 3 - Progress: Met SLP Long Term Goals SLP Long Term Goal 1: Same as short  Assessment/Plan  Patient Active Problem List  Diagnosis  . Atrial fibrillation  . DM type 2 (diabetes mellitus, type 2)  . Hyperlipemia  . Obesity  . Benign hypertension  . H/O: stroke  . Febrile illness, acute  . Cough  . Diarrhea  . Dehydration  . PNA (pneumonia)  . Anemia  . Thrombocytopenia  . Cellulitis of left leg  . RMSF [facility] spotted fever)   SLP - End of Session Activity Tolerance: Patient tolerated treatment well General Behavior During Session: [facility] for tasks performed Cognition: [facility] for tasks performed  SLP Assessment/Plan Clinical Impression Statement: Mr. Newey is independent with home exercise program for swallow exercises and he can state his swallow  precautions. He does not remember to implement chin tuck 100% of the time, but does try to do. When his wife is nearby, she cues him. Goals have been met and pt will continue to complete swallowing exercises at home going forward. No further SLP services indicated at this time. Thank you. Speech Therapy Frequency: min 1 x/week Duration: 2 weeks (3 weeks) Treatment/Interventions: Aspiration precaution training;Pharyngeal strengthening exercises;Diet toleration management by SLP;Compensatory techniques;SLP instruction and feedback;Patient/family education Potential to Achieve Goals: Good Potential Considerations:  (time since onset)  GN Functional Limitations: Swallowing Swallow Current Status (N5621): At least 1 percent but less than 20 percent impaired, limited or restricted Swallow Goal Status [phone]): At least 1 percent but less than 20 percent impaired, limited or restricted Swallow Discharge Status [phone]): At least 1 percent but less than 20 percent impaired, limited or restricted  PORTER,DABNEY 06/03/2012, 3:09 PM

## 2012-09-09 ENCOUNTER — Other Ambulatory Visit: Payer: Self-pay | Admitting: Pharmacist Clinician (PhC)/ Clinical Pharmacy Specialist

## 2012-09-09 MED ORDER — DABIGATRAN ETEXILATE MESYLATE 150 MG PO CAPS: 150.0000 mg | ORAL_CAPSULE | Freq: Two times a day (BID) | ORAL | Status: DC

## 2013-01-15 ENCOUNTER — Ambulatory Visit (INDEPENDENT_AMBULATORY_CARE_PROVIDER_SITE_OTHER): Payer: Medicare Other | Admitting: Podiatry

## 2013-01-15 ENCOUNTER — Encounter: Payer: Self-pay | Admitting: Podiatry

## 2013-01-15 VITALS — BP 158/82 | HR 60 | Resp 16 | Ht 68.0 in | Wt 220.0 lb

## 2013-01-15 DIAGNOSIS — M79609 Pain in unspecified limb: Secondary | ICD-10-CM

## 2013-01-15 DIAGNOSIS — B351 Tinea unguium: Secondary | ICD-10-CM

## 2013-01-15 NOTE — Progress Notes (Signed)
Subjective:     Patient ID: [name], male   DOB: [birth year], 72 y.o.   MRN: [id number]  HPI elongated thick toenails which patient is unable to cut bilateral   Review of Systems  All other systems reviewed and are negative.       Objective:   Physical Exam  Nursing note and vitals reviewed. Cardiovascular: Intact distal pulses.   Neurological: He is alert.  Skin: Skin is warm.   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

## 2013-05-11 ENCOUNTER — Ambulatory Visit (INDEPENDENT_AMBULATORY_CARE_PROVIDER_SITE_OTHER): Payer: Medicare Other | Admitting: Podiatry

## 2013-05-11 ENCOUNTER — Ambulatory Visit: Payer: Medicare Other | Admitting: Podiatry

## 2013-05-11 VITALS — BP 155/84 | HR 62 | Resp 16 | Ht 68.0 in | Wt 220.0 lb

## 2013-05-11 DIAGNOSIS — M79609 Pain in unspecified limb: Secondary | ICD-10-CM

## 2013-05-11 DIAGNOSIS — B351 Tinea unguium: Secondary | ICD-10-CM

## 2013-05-12 NOTE — Progress Notes (Signed)
Subjective:     Patient ID: [name], male   DOB: [birth year], 73 y.o.   MRN: [id number]  HPI patient presents with painful nailbeds 1-5 both feet that he can not cut   Review of Systems     Objective:   Physical Exam Neurovascular status unchanged with thick nailbeds that are painful 1-5 both feet    Assessment:     Mycotic nail infection with pain 1-5 both feet    Plan:     Debridement of painful nail bed 1-5 both feet with no iatrogenic bleeding noted

## 2013-07-22 IMAGING — RF DG ESOPHAGUS
13 of 16 series · 19 of 24 positions shown · non-contrast
Comparison: None.

CLINICAL DATA: Difficulty swallowing

ESOPHOGRAM/BARIUM SWALLOW
TECHNIQUE: Single contrast examination was performed using thin
barium.
Fluoroscopy time:  2.9 minutes.

[Series 1: run · 3 of 8 slices shown (1 of 13)]
[im 1/8]
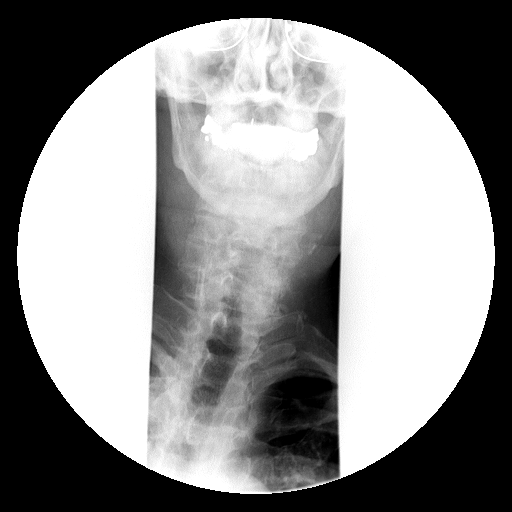
[im 3/8]
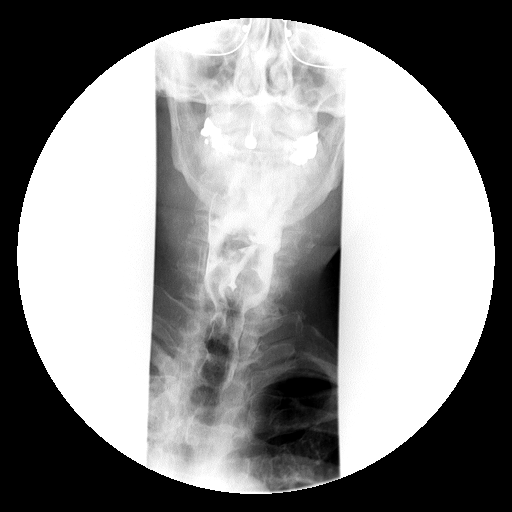
[im 8/8]
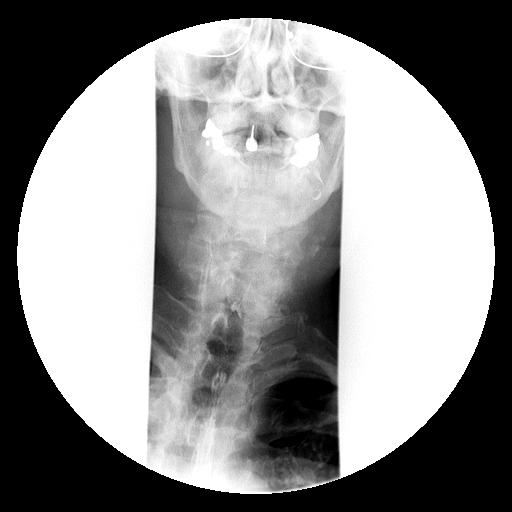

[Series 2: run · 5 of 9 slices shown (2 of 13)]
[im 1/9]
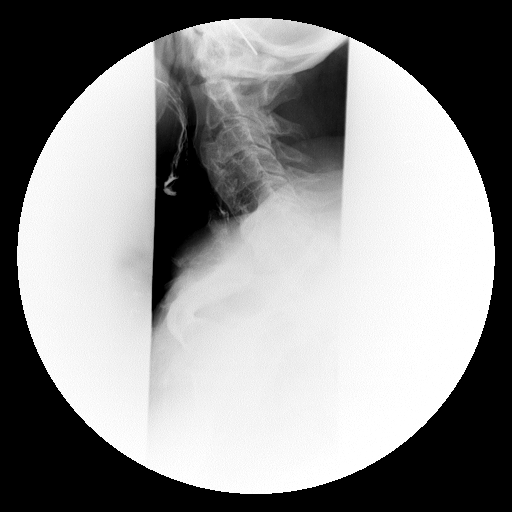
[im 2/9]
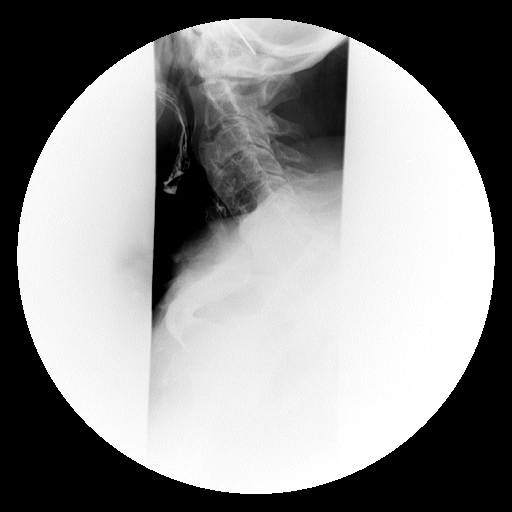
[im 4/9]
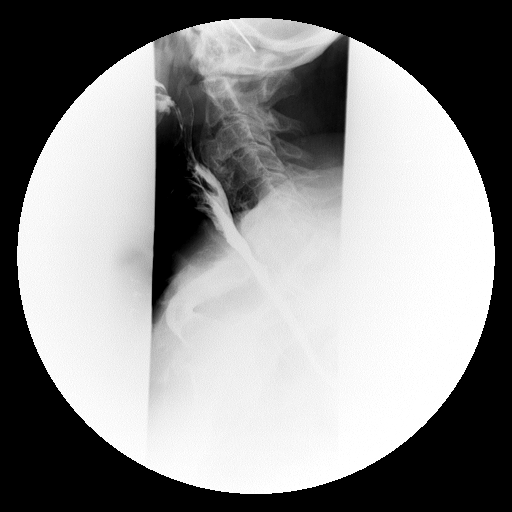
[im 7/9]
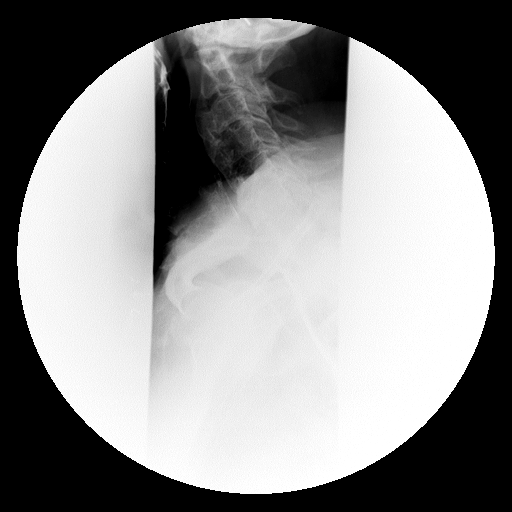
[im 9/9]
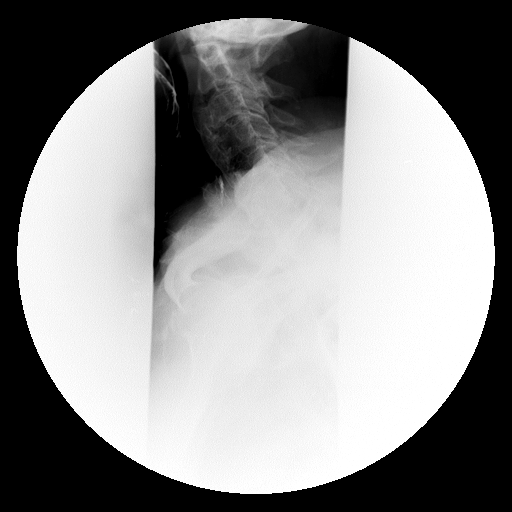

[Series 3: run · 1 of 1 slices shown (3 of 13)]
[im 1/1]
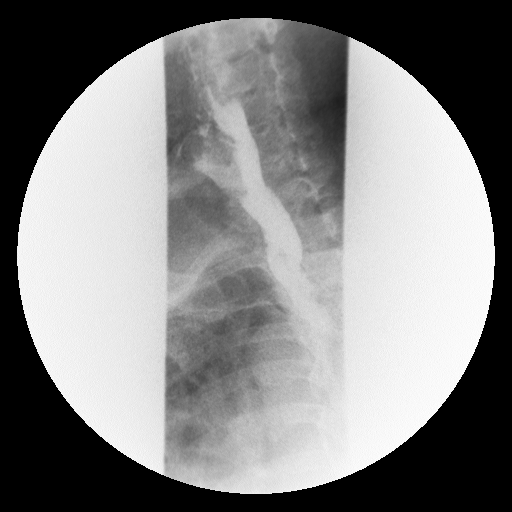

[Series 5: run · 1 of 1 slices shown (4 of 13)]
[im 1/1]
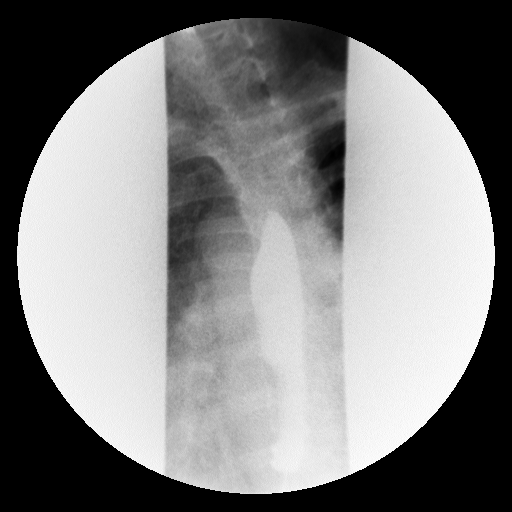

[Series 6: run · 1 of 1 slices shown (5 of 13)]
[im 1/1]
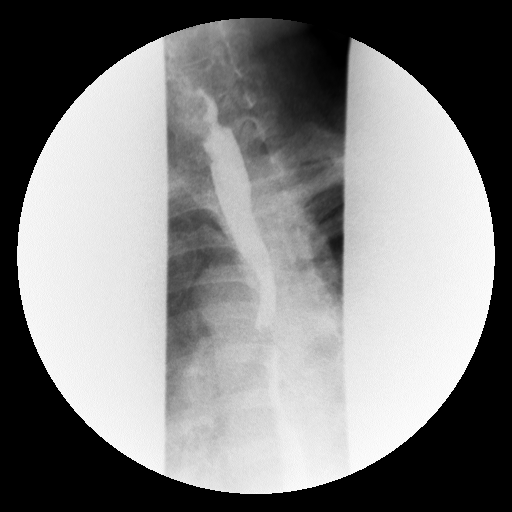

[Series 7: run · 1 of 1 slices shown (6 of 13)]
[im 1/1]
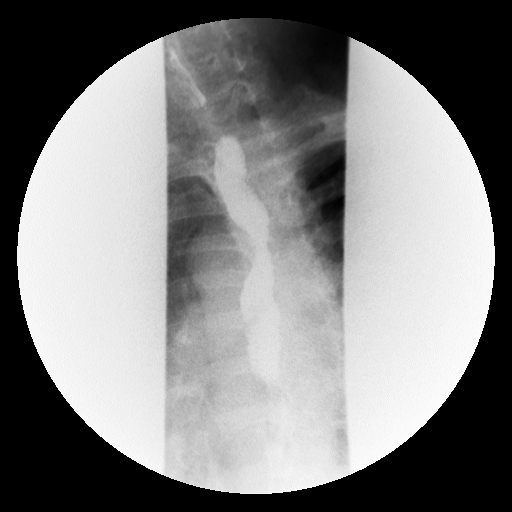

[Series 8: run · 1 of 1 slices shown (7 of 13)]
[im 1/1]
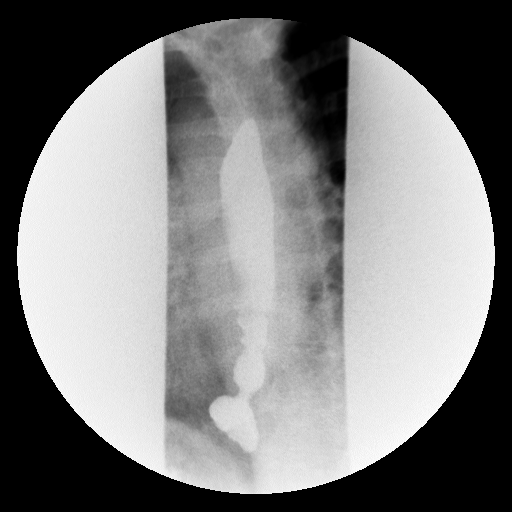

[Series 10: run · 1 of 1 slices shown (8 of 13)]
[im 1/1]
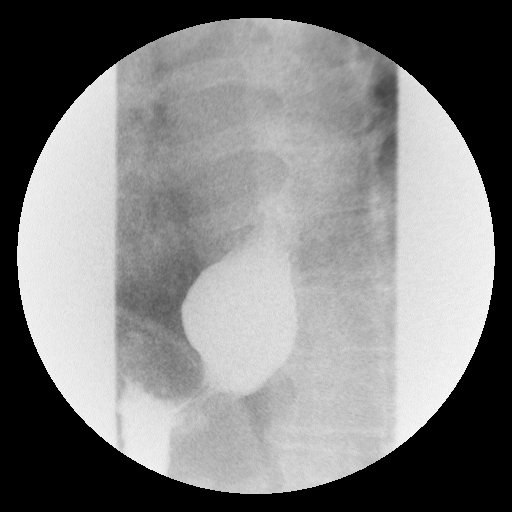

[Series 11: run · 1 of 1 slices shown (9 of 13)]
[im 1/1]
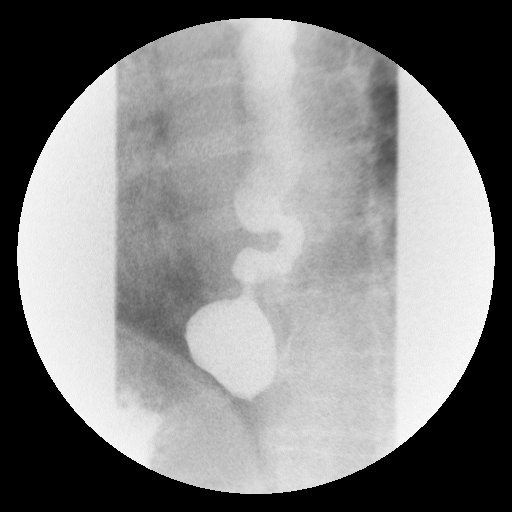

[Series 12: run · 1 of 1 slices shown (10 of 13)]
[im 1/1]
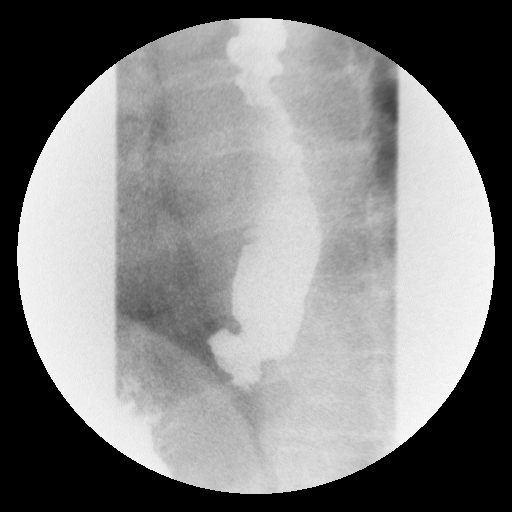

[Series 13: run · 1 of 1 slices shown (11 of 13)]
[im 1/1]
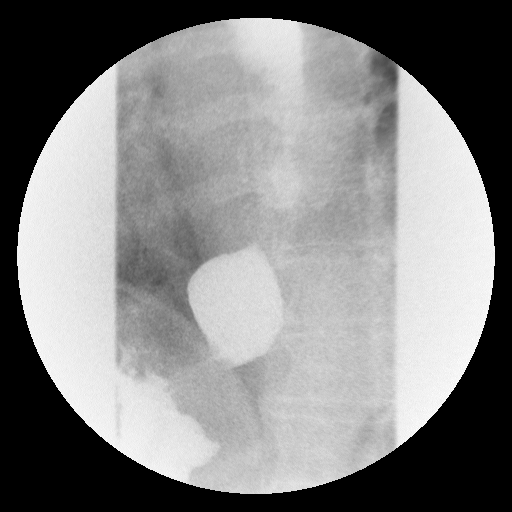

[Series 15: run · 1 of 1 slices shown (12 of 13)]
[im 1/1]
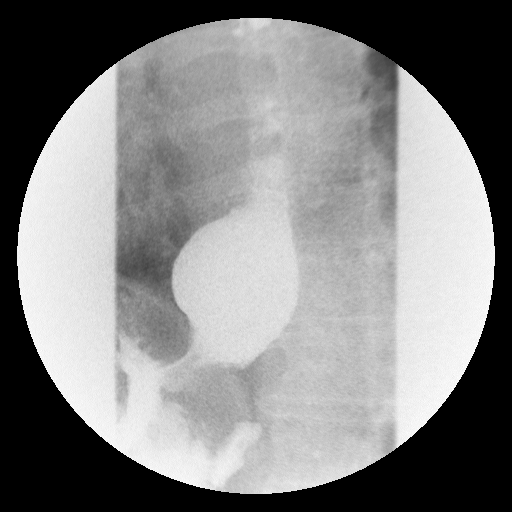

[Series 16: run · 1 of 1 slices shown (13 of 13)]
[im 1/1]
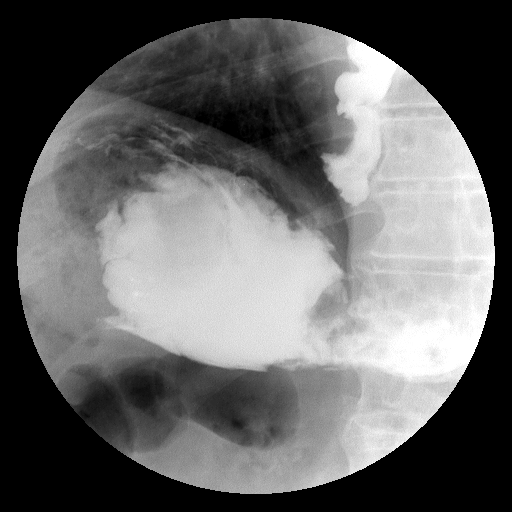

[19 of 24 positions shown; findings below may reference images not displayed]

FINDINGS: A single contrast study was performed.  The swallowing
mechanism is unremarkable.  No aspiration is seen.   There is
abnormal esophageal peristalsis with significant tertiary
contractions and "presbyesophagus."  There is a prominent ampulla.
No definite hiatal hernia is seen.  No reflux could be
demonstrated.  A barium pill was given at the end of the study
which did pass into the stomach.  There appears to be a moderate
amount of food debris within the stomach.
IMPRESSION: 1.  Significant tertiary contractions consistent with
"presbyesophagus."
2.  No definite hiatal hernia or reflux.

## 2013-08-10 ENCOUNTER — Ambulatory Visit: Payer: Medicare Other | Admitting: Podiatry

## 2013-09-02 ENCOUNTER — Telehealth: Payer: Self-pay | Admitting: Neurology

## 2013-09-02 ENCOUNTER — Encounter: Payer: Self-pay | Admitting: *Deleted

## 2013-09-02 ENCOUNTER — Emergency Department (HOSPITAL_COMMUNITY): Payer: Medicare HMO

## 2013-09-02 ENCOUNTER — Encounter (HOSPITAL_COMMUNITY): Payer: Self-pay | Admitting: Emergency Medicine

## 2013-09-02 ENCOUNTER — Inpatient Hospital Stay (HOSPITAL_COMMUNITY)
Admission: EM | Admit: 2013-09-02 | Discharge: 2013-09-05 | DRG: 065 | Disposition: A | Discharge status: Home Health | Payer: Medicare HMO | Attending: Internal Medicine | Admitting: Internal Medicine

## 2013-09-02 DIAGNOSIS — A770 Spotted fever due to Rickettsia rickettsii: Secondary | ICD-10-CM

## 2013-09-02 DIAGNOSIS — I69922 Dysarthria following unspecified cerebrovascular disease: Secondary | ICD-10-CM

## 2013-09-02 DIAGNOSIS — Z8673 Personal history of transient ischemic attack (TIA), and cerebral infarction without residual deficits: Secondary | ICD-10-CM

## 2013-09-02 DIAGNOSIS — D696 Thrombocytopenia, unspecified: Secondary | ICD-10-CM

## 2013-09-02 DIAGNOSIS — D649 Anemia, unspecified: Secondary | ICD-10-CM

## 2013-09-02 DIAGNOSIS — R270 Ataxia, unspecified: Secondary | ICD-10-CM

## 2013-09-02 DIAGNOSIS — R279 Unspecified lack of coordination: Secondary | ICD-10-CM | POA: Diagnosis present

## 2013-09-02 DIAGNOSIS — I4891 Unspecified atrial fibrillation: Secondary | ICD-10-CM | POA: Diagnosis present

## 2013-09-02 DIAGNOSIS — G4733 Obstructive sleep apnea (adult) (pediatric): Secondary | ICD-10-CM | POA: Diagnosis present

## 2013-09-02 DIAGNOSIS — R410 Disorientation, unspecified: Secondary | ICD-10-CM | POA: Diagnosis present

## 2013-09-02 DIAGNOSIS — J189 Pneumonia, unspecified organism: Secondary | ICD-10-CM

## 2013-09-02 DIAGNOSIS — F29 Unspecified psychosis not due to a substance or known physiological condition: Secondary | ICD-10-CM

## 2013-09-02 DIAGNOSIS — E785 Hyperlipidemia, unspecified: Secondary | ICD-10-CM | POA: Diagnosis present

## 2013-09-02 DIAGNOSIS — E669 Obesity, unspecified: Secondary | ICD-10-CM | POA: Diagnosis present

## 2013-09-02 DIAGNOSIS — R059 Cough, unspecified: Secondary | ICD-10-CM

## 2013-09-02 DIAGNOSIS — Z79899 Other long term (current) drug therapy: Secondary | ICD-10-CM

## 2013-09-02 DIAGNOSIS — E860 Dehydration: Secondary | ICD-10-CM

## 2013-09-02 DIAGNOSIS — H53469 Homonymous bilateral field defects, unspecified side: Secondary | ICD-10-CM | POA: Diagnosis present

## 2013-09-02 DIAGNOSIS — I10 Essential (primary) hypertension: Secondary | ICD-10-CM | POA: Diagnosis present

## 2013-09-02 DIAGNOSIS — I6340 Cerebral infarction due to embolism of unspecified cerebral artery: Principal | ICD-10-CM | POA: Diagnosis present

## 2013-09-02 DIAGNOSIS — E119 Type 2 diabetes mellitus without complications: Secondary | ICD-10-CM | POA: Diagnosis present

## 2013-09-02 DIAGNOSIS — R509 Fever, unspecified: Secondary | ICD-10-CM

## 2013-09-02 DIAGNOSIS — L03116 Cellulitis of left lower limb: Secondary | ICD-10-CM

## 2013-09-02 DIAGNOSIS — I4819 Other persistent atrial fibrillation: Secondary | ICD-10-CM | POA: Diagnosis present

## 2013-09-02 DIAGNOSIS — R197 Diarrhea, unspecified: Secondary | ICD-10-CM

## 2013-09-02 DIAGNOSIS — Z794 Long term (current) use of insulin: Secondary | ICD-10-CM

## 2013-09-02 DIAGNOSIS — I69959 Hemiplegia and hemiparesis following unspecified cerebrovascular disease affecting unspecified side: Secondary | ICD-10-CM

## 2013-09-02 DIAGNOSIS — Z7901 Long term (current) use of anticoagulants: Secondary | ICD-10-CM

## 2013-09-02 DIAGNOSIS — Z6833 Body mass index (BMI) 33.0-33.9, adult: Secondary | ICD-10-CM

## 2013-09-02 DIAGNOSIS — R4701 Aphasia: Secondary | ICD-10-CM | POA: Diagnosis present

## 2013-09-02 DIAGNOSIS — R05 Cough: Secondary | ICD-10-CM

## 2013-09-02 LAB — COMPREHENSIVE METABOLIC PANEL
ALT: 36 U/L (ref 0–53)
AST: 29 U/L (ref 0–37)
Albumin: 4.3 g/dL (ref 3.5–5.2)
Alkaline Phosphatase: 74 U/L (ref 39–117)
BUN: 19 mg/dL (ref 6–23)
CO2: 27 mEq/L (ref 19–32)
Calcium: 10.1 mg/dL (ref 8.4–10.5)
Chloride: 104 mEq/L (ref 96–112)
Creatinine, Ser: 1.02 mg/dL (ref 0.50–1.35)
GFR calc Af Amer: 83 mL/min — ABNORMAL LOW (ref >90)
GFR calc non Af Amer: 71 mL/min — ABNORMAL LOW (ref >90)
Glucose, Bld: 186 mg/dL — ABNORMAL HIGH (ref 70–99)
Potassium: 4.4 mEq/L (ref 3.7–5.3)
Sodium: 142 mEq/L (ref 137–147)
Total Bilirubin: 0.6 mg/dL (ref 0.3–1.2)
Total Protein: 7.4 g/dL (ref 6.0–8.3)

## 2013-09-02 LAB — URINALYSIS, ROUTINE W REFLEX MICROSCOPIC
Bilirubin Urine: NEGATIVE
Glucose, UA: 250 mg/dL — AB
Hgb urine dipstick: NEGATIVE
Ketones, ur: NEGATIVE mg/dL
Leukocytes, UA: NEGATIVE
Nitrite: NEGATIVE
Protein, ur: NEGATIVE mg/dL
Specific Gravity, Urine: 1.012 (ref 1.005–1.030)
Urobilinogen, UA: 1 mg/dL (ref 0.0–1.0)
pH: 7 (ref 5.0–8.0)

## 2013-09-02 LAB — RAPID URINE DRUG SCREEN, HOSP PERFORMED
Amphetamines: NOT DETECTED
Barbiturates: NOT DETECTED
Benzodiazepines: NOT DETECTED
Cocaine: NOT DETECTED
Opiates: NOT DETECTED
Tetrahydrocannabinol: NOT DETECTED

## 2013-09-02 LAB — CBC
HCT: 44.2 % (ref 39.0–52.0)
Hemoglobin: 15.6 g/dL (ref 13.0–17.0)
MCH: 32.1 pg (ref 26.0–34.0)
MCHC: 35.3 g/dL (ref 30.0–36.0)
MCV: 90.9 fL (ref 78.0–100.0)
Platelets: 233 10*3/uL (ref 150–400)
RBC: 4.86 MIL/uL (ref 4.22–5.81)
RDW: 12.1 % (ref 11.5–15.5)
WBC: 7.5 10*3/uL (ref 4.0–10.5)

## 2013-09-02 LAB — PROTIME-INR
INR: 1.05 (ref 0.00–1.49)
Prothrombin Time: 13.5 seconds (ref 11.6–15.2)

## 2013-09-02 LAB — CBG MONITORING, ED: Glucose-Capillary: 174 mg/dL — ABNORMAL HIGH (ref 70–99)

## 2013-09-02 LAB — GLUCOSE, CAPILLARY: Glucose-Capillary: 134 mg/dL — ABNORMAL HIGH (ref 70–99)

## 2013-09-02 LAB — DIFFERENTIAL
Basophils Absolute: 0 10*3/uL (ref 0.0–0.1)
Basophils Relative: 1 % (ref 0–1)
Eosinophils Absolute: 0.2 10*3/uL (ref 0.0–0.7)
Eosinophils Relative: 2 % (ref 0–5)
Lymphocytes Relative: 23 % (ref 12–46)
Lymphs Abs: 1.8 10*3/uL (ref 0.7–4.0)
Monocytes Absolute: 0.7 10*3/uL (ref 0.1–1.0)
Monocytes Relative: 9 % (ref 3–12)
Neutro Abs: 4.9 10*3/uL (ref 1.7–7.7)
Neutrophils Relative %: 65 % (ref 43–77)

## 2013-09-02 LAB — I-STAT CHEM 8, ED
BUN: 21 mg/dL (ref 6–23)
Calcium, Ion: 1.3 mmol/L (ref 1.13–1.30)
Chloride: 100 mEq/L (ref 96–112)
Creatinine, Ser: 1.2 mg/dL (ref 0.50–1.35)
Glucose, Bld: 185 mg/dL — ABNORMAL HIGH (ref 70–99)
HCT: 49 % (ref 39.0–52.0)
Hemoglobin: 16.7 g/dL (ref 13.0–17.0)
Potassium: 4.5 mEq/L (ref 3.7–5.3)
Sodium: 145 mEq/L (ref 137–147)
TCO2: 30 mmol/L (ref 0–100)

## 2013-09-02 LAB — APTT: aPTT: 37 seconds (ref 24–37)

## 2013-09-02 LAB — I-STAT TROPONIN, ED: Troponin i, poc: 0.01 ng/mL (ref 0.00–0.08)

## 2013-09-02 MED ORDER — EZETIMIBE 10 MG PO TABS
10.0000 mg | ORAL_TABLET | Freq: Every day | ORAL | Status: DC
  Administered 2013-09-03 – 2013-09-05 (×3): 10 mg via ORAL
  Filled 2013-09-02 (×3): qty 1

## 2013-09-02 MED ORDER — MIRABEGRON ER 50 MG PO TB24
50.0000 mg | ORAL_TABLET | Freq: Every day | ORAL | Status: DC
  Administered 2013-09-03 – 2013-09-05 (×3): 50 mg via ORAL
  Filled 2013-09-02 (×3): qty 1

## 2013-09-02 MED ORDER — VALSARTAN-HYDROCHLOROTHIAZIDE 320-25 MG PO TABS: 1.0000 | ORAL_TABLET | Freq: Every day | ORAL | Status: DC

## 2013-09-02 MED ORDER — IRBESARTAN 300 MG PO TABS: 300.0000 mg | ORAL_TABLET | Freq: Every day | ORAL | Status: DC

## 2013-09-02 MED ORDER — HYDROCHLOROTHIAZIDE 25 MG PO TABS
25.0000 mg | ORAL_TABLET | Freq: Every day | ORAL | Status: DC
  Administered 2013-09-03 – 2013-09-05 (×4): 25 mg via ORAL
  Filled 2013-09-02 (×4): qty 1

## 2013-09-02 MED ORDER — AMLODIPINE BESYLATE 2.5 MG PO TABS
2.5000 mg | ORAL_TABLET | Freq: Every day | ORAL | Status: DC
  Administered 2013-09-02 – 2013-09-05 (×4): 2.5 mg via ORAL
  Filled 2013-09-02 (×4): qty 1

## 2013-09-02 MED ORDER — FENOFIBRATE 160 MG PO TABS
160.0000 mg | ORAL_TABLET | Freq: Every day | ORAL | Status: DC
Start: 2013-09-03 — End: 2013-09-05
  Administered 2013-09-03 – 2013-09-05 (×3): 160 mg via ORAL
  Filled 2013-09-02 (×3): qty 1

## 2013-09-02 MED ORDER — BENAZEPRIL HCL 10 MG PO TABS: 10.0000 mg | ORAL_TABLET | Freq: Every day | ORAL | Status: DC

## 2013-09-02 MED ORDER — MONTELUKAST SODIUM 10 MG PO TABS
10.0000 mg | ORAL_TABLET | Freq: Every day | ORAL | Status: DC
  Administered 2013-09-03 – 2013-09-04 (×2): 10 mg via ORAL
  Filled 2013-09-02 (×4): qty 1

## 2013-09-02 MED ORDER — CARVEDILOL 25 MG PO TABS
25.0000 mg | ORAL_TABLET | Freq: Two times a day (BID) | ORAL | Status: DC
  Administered 2013-09-03 – 2013-09-05 (×5): 25 mg via ORAL
  Filled 2013-09-02 (×8): qty 1

## 2013-09-02 MED ORDER — DABIGATRAN ETEXILATE MESYLATE 150 MG PO CAPS
150.0000 mg | ORAL_CAPSULE | Freq: Two times a day (BID) | ORAL | Status: DC
  Administered 2013-09-02 – 2013-09-04 (×4): 150 mg via ORAL
  Filled 2013-09-02 (×5): qty 1

## 2013-09-02 MED ORDER — BENAZEPRIL HCL 10 MG PO TABS
10.0000 mg | ORAL_TABLET | Freq: Every day | ORAL | Status: DC
  Administered 2013-09-02 – 2013-09-05 (×4): 10 mg via ORAL
  Filled 2013-09-02 (×4): qty 1

## 2013-09-02 MED ORDER — ASPIRIN 325 MG PO TABS
325.0000 mg | ORAL_TABLET | Freq: Every day | ORAL | Status: DC
  Administered 2013-09-02 – 2013-09-03 (×2): 325 mg via ORAL
  Filled 2013-09-02 (×2): qty 1

## 2013-09-02 MED ORDER — CARVEDILOL 25 MG PO TABS
25.0000 mg | ORAL_TABLET | Freq: Two times a day (BID) | ORAL | Status: DC
  Filled 2013-09-02: qty 1

## 2013-09-02 MED ORDER — HYDROCHLOROTHIAZIDE 25 MG PO TABS: 25.0000 mg | ORAL_TABLET | Freq: Every day | ORAL | Status: DC

## 2013-09-02 MED ORDER — FENOFIBRIC ACID 135 MG PO CPDR: 1.0000 | DELAYED_RELEASE_CAPSULE | Freq: Every day | ORAL | Status: DC

## 2013-09-02 MED ORDER — IRBESARTAN 300 MG PO TABS
300.0000 mg | ORAL_TABLET | Freq: Every day | ORAL | Status: DC
  Administered 2013-09-03 – 2013-09-05 (×4): 300 mg via ORAL
  Filled 2013-09-02 (×4): qty 1

## 2013-09-02 MED ORDER — ATORVASTATIN CALCIUM 40 MG PO TABS: 40.0000 mg | ORAL_TABLET | Freq: Every day | ORAL | Status: DC

## 2013-09-02 MED ORDER — AMLODIPINE BESYLATE 2.5 MG PO TABS: 2.5000 mg | ORAL_TABLET | Freq: Every day | ORAL | Status: DC

## 2013-09-02 MED ORDER — ATORVASTATIN CALCIUM 40 MG PO TABS
40.0000 mg | ORAL_TABLET | Freq: Every day | ORAL | Status: DC
  Administered 2013-09-02 – 2013-09-04 (×3): 40 mg via ORAL
  Filled 2013-09-02 (×4): qty 1

## 2013-09-02 NOTE — ED Provider Notes (Signed)
CSN: [id number]     Arrival date & time 09/02/13  1613 History   First MD Initiated Contact with Patient 09/02/13 1712     Chief Complaint  Patient presents with  . Altered Mental Status     (Consider location/radiation/quality/duration/timing/severity/associated sxs/prior Treatment) Patient is a 73 y.o. male presenting with altered mental status. The history is provided by the patient.  Altered Mental Status Presenting symptoms: confusion, disorientation and memory loss   Severity:  Mild Most recent episode:  More than 2 days ago Episode history:  Continuous Duration: 2-3 weeks. Timing:  Constant Progression:  Worsening Chronicity:  New Context comment:  At rest Associated symptoms: no abdominal pain, no fever, no headaches, no nausea and no vomiting     Past Medical History  Diagnosis Date  . Hypertension   . Diabetes mellitus   . 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 Stroke 2010/2013    Right Brain stroke  . Sleep apnea     cpap  . Shortness of breath     exertion  . Depression   . Atrial fibrillation   . GERD (gastroesophageal reflux disease)   . Obesity   . 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  . Cholelithiasis 10/2011    Per CT. No cholecystitis radiographically.  . Hemorrhoids 2008  . Hyperlipidemia    Past Surgical History  Procedure Laterality Date  . Knee surgery    . Tonsillectomy    . Colonoscopy    . Tee without cardioversion  07/12/2011    Procedure: TRANSESOPHAGEAL ECHOCARDIOGRAM (TEE);  Surgeon: Pixie Casino, MD;  Location: [facility] ENDOSCOPY;  Service: Cardiovascular;  Laterality: N/A;  to be done at 1330  . Cardioversion  07/12/2011    Procedure: CARDIOVERSION;  Surgeon: Pixie Casino, MD;  Location: [facility] ENDOSCOPY;  Service: Cardiovascular;  Laterality: N/A;   Family History  Problem Relation Age of Onset  . Diabetes Mother   . Diabetes Son   . Pancreatic cancer     GRANDFATHER  . Colon cancer      INTESTINAL, GRANDMOTHER   History  Substance Use Topics  . Smoking status: Never Smoker   . Smokeless tobacco: Not on file  . Alcohol Use: 1.2 oz/week    2 Glasses of wine per week    Review of Systems  Constitutional: Negative for fever.  HENT: Negative for drooling and rhinorrhea.   Eyes: Positive for visual disturbance (mild blurriness). Negative for pain.  Respiratory: Negative for cough and shortness of breath.   Cardiovascular: Negative for chest pain and leg swelling.  Gastrointestinal: Negative for nausea, vomiting, abdominal pain and diarrhea.  Genitourinary: Negative for dysuria and hematuria.  Musculoskeletal: Negative for gait problem and neck pain.  Skin: Negative for color change.  Neurological: Negative for numbness and headaches.       Mild imbalance Confusion   Hematological: Negative for adenopathy.  Psychiatric/Behavioral: Positive for memory loss and confusion. Negative for behavioral problems.  All other systems reviewed and are negative.     Allergies  Review of patient's allergies indicates no known allergies.  Home Medications   Prior to Admission medications   Medication Sig Start Date End Date Taking? Authorizing Provider  amLODipine (NORVASC) 2.5 MG tablet Take 2.5 mg by mouth daily.    Historical Provider, MD  benazepril (LOTENSIN) 10 MG tablet Take 10 mg by mouth daily.    Historical Provider, MD  carvedilol (COREG)  12.5 MG tablet Take 2 tablets (25 mg total) by mouth 2 (two) times daily with a meal. 05/16/11   Delfina Redwood, MD  Choline Fenofibrate (TRILIPIX) 135 MG capsule Take 135 mg by mouth daily.    Historical Provider, MD  dabigatran (PRADAXA) 150 MG CAPS Take 1 capsule (150 mg total) by mouth every 12 (twelve) hours. 09/09/12   Pixie Casino, MD  ezetimibe (ZETIA) 10 MG tablet Take 10 mg by mouth daily.    Historical Provider, MD  insulin glargine (LANTUS SOLOSTAR) 100 UNIT/ML injection Inject 32  Units into the skin at bedtime.    Historical Provider, MD  insulin lispro (HUMALOG) 100 UNIT/ML injection Inject 14-20 Units into the skin 3 (three) times daily before meals. Inject 14 units every morning with breakfast, 17 units with lunch, and 20 units with supper.  Humalog Kwikpen    Historical Provider, MD  levofloxacin (LEVAQUIN) 500 MG tablet Take 1 tablet (500 mg total) by mouth daily. 12/26/11   Blanchie Dessert, MD  polycarbophil (FIBERCON) 625 MG tablet Take 625 mg by mouth daily.    Historical Provider, MD  simvastatin (ZOCOR) 80 MG tablet Take 80 mg by mouth daily.    Historical Provider, MD  valsartan-hydrochlorothiazide (DIOVAN-HCT) 320-25 MG per tablet Take 1 tablet by mouth daily.    Historical Provider, MD   BP 138/74  Pulse 67  Temp(Src) 98.5 F (36.9 C) (Oral)  Resp 20  Ht 5\' 8"  (1.727 m)  SpO2 99% Physical Exam  Nursing note and vitals reviewed. Constitutional: He is oriented to person, place, and time. He appears well-developed and well-nourished.  HENT:  Head: Normocephalic and atraumatic.  Right Ear: External ear normal.  Left Ear: External ear normal.  Nose: Nose normal.  Mouth/Throat: Oropharynx is clear and moist. No oropharyngeal exudate.  Eyes: Conjunctivae and EOM are normal. Pupils are equal, round, and reactive to light.  Neck: Normal range of motion. Neck supple.  Cardiovascular: Normal rate, regular rhythm, normal heart sounds and intact distal pulses.  Exam reveals no gallop and no friction rub.   No murmur heard. Pulmonary/Chest: Effort normal and breath sounds normal. No respiratory distress. He has no wheezes.  Abdominal: Soft. Bowel sounds are normal. He exhibits no distension. There is no tenderness. There is no rebound and no guarding.  Musculoskeletal: Normal range of motion. He exhibits no edema and no tenderness.  Neurological: He is alert and oriented to person, place, and time.  alert, oriented x2, knows month but not year speech: normal in  context and clarity memory: intact grossly cranial nerves II-XII: intact, normal peripheral fields motor strength: full proximally and distally no involuntary movements or tremors sensation: intact to light touch diffusely  cerebellar: finger-to-nose and heel-to-shin intact gait: pt able to ambulate forwards and backwards w/ mild imbalance   Skin: Skin is warm and dry.  Psychiatric: He has a normal mood and affect. His behavior is normal.    ED Course  Procedures (including critical care time) Labs Review Labs Reviewed  CBG MONITORING, ED - Abnormal; Notable for the following:    Glucose-Capillary 174 (*)    All other components within normal limits  I-STAT CHEM 8, ED - Abnormal; Notable for the following:    Glucose, Bld 185 (*)    All other components within normal limits  CBC  DIFFERENTIAL  COMPREHENSIVE METABOLIC PANEL  I-STAT TROPOININ, ED    Imaging Review Ct Head (brain) Wo Contrast  09/02/2013   CLINICAL DATA:  Altered  mental status. Prior cerebral vascular accident.  EXAM: CT HEAD WITHOUT CONTRAST  TECHNIQUE: Contiguous axial images were obtained from the base of the skull through the vertex without intravenous contrast.  COMPARISON:  Head CT 12/26/2011  FINDINGS: No acute intracranial hemorrhage. No focal mass lesion. No CT evidence of acute infarction. No midline shift or mass effect. No hydrocephalus. Basilar cisterns are patent. There is extensive periventricular and white matter hypodensities. There are two cortical hypodensities in the left frontal lobe unchanged from prior.  Paranasal sinuses and mastoid air cells are clear. There is mild polypoid mucosal thickening in the right maxillary sinus.  IMPRESSION: 1. No acute intracranial findings. 2. Remote infarctions in the left frontal lobe and extensive white matter microvascular disease.   Electronically Signed   By: Suzy Bouchard M.D.   On: 09/02/2013 17:32     EKG Interpretation   Date/Time:  Wednesday September 02 2013 16:18:21 EDT Ventricular Rate:  65 PR Interval:  204 QRS Duration: 112 QT Interval:  420 QTC Calculation: 436 R Axis:   -9 Text Interpretation:  Normal sinus rhythm Minimal voltage criteria for  LVH, may be normal variant Nonspecific T wave abnormality No significant  change since last tracing Confirmed by   MD, [place] ([id number]) on  09/02/2013 4:55:23 PM      MDM   Final diagnoses:  Ataxia  Confusion    5:37 PM 73 y.o. male with a history of stroke who presents with gradual decline in mental status over the last 2-3 weeks. He notes worsening imbalance, confusion, mild expressive aphasia. The wife also notes a change in his voice. He is afebrile and mildly hypertensive here. Will workup for stroke.  Consulted neurology. Will admit to hospitalist.     Blanchard Kelch, MD 09/03/13 1121

## 2013-09-02 NOTE — ED Notes (Signed)
Pt presents to ED with concerns that he may be having another "small stroke," pt states "for the last 1 1/2-2 days everything has been foggy." Pt reports he has been confused, pt states he has cancelled his Dr appointments for today so he could come in here and be seen. Pt states "everything is mixed up." Pt states he is at the hospital, "maybe the emergency room," pt is unable to give me the year, states the year is '19 something," pt reports the date is "2,3,4.

## 2013-09-02 NOTE — Telephone Encounter (Signed)
Patient's wife called to state that she had to call an ambulance for her husband as he is not doing well and cancelled tomorrow's appointment. She wanted to be sure Dr. Janann Colonel was aware.

## 2013-09-02 NOTE — ED Notes (Signed)
Pt's wife states he has not been himself for about 2 days. He has been confused and not been able to get his thoughts out right. He also has been off balance.

## 2013-09-02 NOTE — H&P (Signed)
[facility] History and Physical  ANDERSSON LARRABEE WNU:[id number] DOB: 06/23/1940 DOA: 09/02/2013  Referring physician:   EDP PCP: Sheela Stack, MD  Specialists:   Chief Complaint:  Confusion  HPI: [name] is a 73 y.o. male with hx of previous CVA, HTN, DM2,  and Hyperlipidemia who presents with complaints of decline over the past 2 weeks, and confusion for the past 2 days. Patients wife had noticed that he was off balance with his walking.      Review of Systems:   Denies Fevers,Chills, Dysuria, Cough, Congestion, Dyspnea, DOE, Edema, Headaches, Dizziness,  Chest pain, Nausea Vomiting Diarrhea, Seizures, syncope, Tremors, +Generalized Weakess.     Past Medical History  Diagnosis Date  . Hypertension   . Diabetes mellitus   . 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 Stroke 2010/2013    Right Brain stroke  . Sleep apnea     cpap  . Shortness of breath     exertion  . Depression   . Atrial fibrillation   . GERD (gastroesophageal reflux disease)   . Obesity   . 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  . Cholelithiasis 10/2011    Per CT. No cholecystitis radiographically.  . Hemorrhoids 2008  . Hyperlipidemia     Past Surgical History  Procedure Laterality Date  . Knee surgery    . Tonsillectomy    . Colonoscopy    . Tee without cardioversion  07/12/2011    Procedure: TRANSESOPHAGEAL ECHOCARDIOGRAM (TEE);  Surgeon: Pixie Casino, MD;  Location: [facility] ENDOSCOPY;  Service: Cardiovascular;  Laterality: N/A;  to be done at 1330  . Cardioversion  07/12/2011    Procedure: CARDIOVERSION;  Surgeon: Pixie Casino, MD;  Location: [facility] ENDOSCOPY;  Service: Cardiovascular;  Laterality: N/A;     Prior to Admission medications   Medication Sig Start Date End Date Taking? Authorizing Provider  amLODipine (NORVASC) 2.5 MG tablet Take 2.5 mg by mouth daily.   Yes Historical Provider, MD  benazepril (LOTENSIN) 10  MG tablet Take 10 mg by mouth daily.   Yes Historical Provider, MD  carvedilol (COREG) 12.5 MG tablet Take 2 tablets (25 mg total) by mouth 2 (two) times daily with a meal. 05/16/11  Yes Delfina Redwood, MD  Choline Fenofibrate (FENOFIBRIC ACID) 135 MG CPDR Take 1 capsule by mouth daily.   Yes Historical Provider, MD  dabigatran (PRADAXA) 150 MG CAPS capsule Take 150 mg by mouth 2 (two) times daily.   Yes Historical Provider, MD  dabigatran (PRADAXA) 150 MG CAPS Take 1 capsule (150 mg total) by mouth every 12 (twelve) hours. 09/09/12  Yes Pixie Casino, MD  ezetimibe (ZETIA) 10 MG tablet Take 10 mg by mouth daily.   Yes Historical Provider, MD  Insulin Disposable Pump (V-GO 20) KIT 30 units of lipase by Does not apply route daily.   Yes Historical Provider, MD  mirabegron ER (MYRBETRIQ) 50 MG TB24 tablet Take 50 mg by mouth daily.   Yes Historical Provider, MD  montelukast (SINGULAIR) 10 MG tablet Take 10 mg by mouth daily.   Yes Historical Provider, MD  simvastatin (ZOCOR) 80 MG tablet Take 80 mg by mouth daily.   Yes Historical Provider, MD  valsartan-hydrochlorothiazide (DIOVAN-HCT) 320-25 MG per tablet Take 1 tablet by mouth daily.   Yes Historical Provider, MD     No Known Allergies   Social History:  reports that he has  never smoked. He does not have any smokeless tobacco history on file. He reports that he drinks about 1.2 ounces of alcohol per week. He reports that he does not use illicit drugs.     Family History  Problem Relation Age of Onset  . Diabetes Mother   . Diabetes Son   . Pancreatic cancer      GRANDFATHER  . Colon cancer      INTESTINAL, GRANDMOTHER       Physical Exam:  GEN:  Pleasant Elderly Obese 73 y.o. Caucasian male  examined  and in no acute distress; cooperative with exam Filed Vitals:   09/02/13 1830 09/02/13 1845 09/02/13 1858 09/02/13 1945  BP: 150/70 158/66 158/66 155/71  Pulse: 57 60  58  Temp:      TempSrc:      Resp: _0 Height:      SpO2: 95% 99% 99% 98%   Blood pressure 155/71, pulse 58, temperature 98.5 F (36.9 C), temperature source Oral, resp. rate 17, height _1  (1.727 m), SpO2 98.00%. PSYCH: He is alert and oriented x4; does not appear anxious does not appear depressed; affect is normal HEENT: Normocephalic and Atraumatic, Mucous membranes pink; PERRLA; EOM intact; Fundi:  Benign;  No scleral icterus, Nares: Patent, Oropharynx: Clear, Fair Dentition, Neck:  FROM, no cervical lymphadenopathy nor thyromegaly or carotid bruit; no JVD; Breasts:: Not examined CHEST WALL: No tenderness CHEST: Normal respiration, clear to auscultation bilaterally HEART: Regular rate and rhythm; no murmurs rubs or gallops BACK: No kyphosis or scoliosis; no CVA tenderness ABDOMEN: Positive Bowel Sounds,  Obese, soft non-tender; no masses, no organomegaly. Rectal Exam: Not done EXTREMITIES: No cyanosis, clubbing or edema; no ulcerations. Genitalia: not examined PULSES: 2+ and symmetric SKIN: Normal hydration no rash or ulceration CNS:   Mental Status:  Alert, oriented, thought content appropriate. Speech fluent with mild dysarthria. Able to follow 3 step commands without difficulty. In No obvious pain.  Cranial Nerves:  II: Discs flat bilaterally; Visual fields Intact,  Pupils equal and reactive.  III,IV, VI: extra-ocular motions intact bilaterally  V,VII: smile symmetric, facial light touch sensation normal bilaterally  VIII: hearing intact bilaterally  IX,X: gag reflex present  XI: bilateral shoulder shrug  XII: midline tongue extension  Motor:  Right : Upper extremity 5/5    Left:   Upper extremity 5/5  Right:  Lower extremity 5/5    Left :  Lower extremity 5/5  Tone and bulk:normal tone throughout; no atrophy noted  Sensory: Pinprick and light touch intact throughout, bilaterally  Deep Tendon Reflexes: 2+ and symmetric throughout  Plantars/ Babinski: Right: normal Left: normal   Cerebellar:  Finger to nose  with or without difficulty.  Gait: deferred  Vascular: pulses palpable throughout    Labs on Admission:  Basic Metabolic Panel:  Recent Labs Lab 09/02/13 1646 09/02/13 1706  NA 142 145  K 4.4 4.5  CL 104 100  CO2 27  --   GLUCOSE 186* 185*  BUN 19 21  CREATININE 1.02 1.20  CALCIUM 10.1  --    Liver Function Tests:  Recent Labs Lab 09/02/13 1646  AST 29  ALT 36  ALKPHOS 74  BILITOT 0.6  PROT 7.4  ALBUMIN 4.3   No results found for this basename: LIPASE, AMYLASE,  in the last 168 hours No results found for this basename: AMMONIA,  in the last 168 hours CBC:  Recent Labs Lab 09/02/13 1646 09/02/13 1706  WBC 7.5  --  NEUTROABS 4.9  --   HGB 15.6 16.7  HCT 44.2 49.0  MCV 90.9  --   PLT 233  --    Cardiac Enzymes: No results found for this basename: CKTOTAL, CKMB, CKMBINDEX, TROPONINI,  in the last 168 hours  BNP (last 3 results) No results found for this basename: PROBNP,  in the last 8760 hours CBG:  Recent Labs Lab 09/02/13 1657  GLUCAP 174*    Radiological Exams on Admission: Ct Head (brain) Wo Contrast  09/02/2013   CLINICAL DATA:  Altered mental status. Prior cerebral vascular accident.  EXAM: CT HEAD WITHOUT CONTRAST  TECHNIQUE: Contiguous axial images were obtained from the base of the skull through the vertex without intravenous contrast.  COMPARISON:  Head CT 12/26/2011  FINDINGS: No acute intracranial hemorrhage. No focal mass lesion. No CT evidence of acute infarction. No midline shift or mass effect. No hydrocephalus. Basilar cisterns are patent. There is extensive periventricular and white matter hypodensities. There are two cortical hypodensities in the left frontal lobe unchanged from prior.  Paranasal sinuses and mastoid air cells are clear. There is mild polypoid mucosal thickening in the right maxillary sinus.  IMPRESSION: 1. No acute intracranial findings. 2. Remote infarctions in the left frontal lobe and extensive white matter  microvascular disease.   Electronically Signed   By: Suzy Bouchard M.D.   On: 09/02/2013 17:32     EKG: Independently reviewed. Normal Sinus Rhythm Without Acute S_T changes,   LVH changes present rate = 65   Assessment/Plan:   73 y.o. male with  Principal Problem:   Confusion Active Problems:   Atrial fibrillation   DM type 2 (diabetes mellitus, type 2)   Hyperlipemia   Benign hypertension   H/O: stroke   1.  Confusion-   Admit to Observation for TIA Workup, and check UA.   Neuro checks, CT head done Negative Acute, and MRI/MRA and Carotid US and 2D ECHO in AM.    2.   Atrial Fibrillation -  On Pradaxa Rx.     3.   DM2-   continue     Add SSI coverage PRN check HbA1C in AM.    4.   Hyperlipidemia-   Continue Statin Rx.    5.   HTN-   Continue Amlodipine, Carvediliol, Benazepril, and Valsartan/HCTZ.  Monitor Bps.    6.   Hx of Stoke-  Evaluation for TIA vs CVA.       Code Status:   FULL CODE   Family Communication:    No Family present Disposition Plan:         Time spent:  60 minutes  Theressa Millard [facility] Pager [phone]  If 7PM-7AM, please contact night-coverage [email or web address] Password [email or web address] 09/02/2013, 8:25 PM

## 2013-09-02 NOTE — ED Notes (Signed)
Report attempted 

## 2013-09-02 NOTE — ED Notes (Signed)
Pt has hx of stroke affecting left side. Pt's wife states he has had swallowing difficulties since his last stroke and has worked with speech therapy.

## 2013-09-02 NOTE — ED Notes (Signed)
Dr. Jenkins at bedside. 

## 2013-09-02 NOTE — Telephone Encounter (Signed)
Called and left message with wife that the message will be sent to Dr Janann Colonel and to call back to let us know how he is doing.

## 2013-09-02 NOTE — Consult Note (Signed)
Referring Physician: ED    Chief Complaint: Declining neuro-status, confusion  HPI:                                                                                                                                         [name] is an 73 y.o. male with a past medical history significant for HTN, DM type II, hyperlipidemia, atrial fibrillation on pradaxa, OSA on CPAP, cerebral infarct x 2 with residual mild dysarthria, brought in by family due to concern about declining neuro status and confusion for the pasty 2-3 weeks. Unfortunately, family is not at the bedside in order to gather further clinical information. [name] stated that for the past couple of days he has been experiencing " stroke like symptoms" that he describes as having " a foggy, cloudy head and not remembering the day of the week, as well as some deterioration of my speech". However, he denies having focal weakness or numbness, imbalance, double vision, vertigo, HA, difficulty swallowing, or visual disturbances. Stated that his short term memory hasn't been impaired by his previous strokes. CT brain, serologies, and UA unimpressive today.  Date last known well: uncertain Time last known well:uncertainn: no, late presentation  Past Medical History  Diagnosis Date  . Hypertension   . Diabetes mellitus   . 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 Stroke 2010/2013    Right Brain stroke  . Sleep apnea     cpap  . Shortness of breath     exertion  . Depression   . Atrial fibrillation   . GERD (gastroesophageal reflux disease)   . Obesity   . 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  . Cholelithiasis 10/2011    Per CT. No cholecystitis radiographically.  . Hemorrhoids 2008  . Hyperlipidemia     Past Surgical History  Procedure Laterality Date  . Knee surgery    . Tonsillectomy    . Colonoscopy    . Tee without cardioversion  07/12/2011     Procedure: TRANSESOPHAGEAL ECHOCARDIOGRAM (TEE);  Surgeon: Pixie Casino, MD;  Location: [facility] ENDOSCOPY;  Service: Cardiovascular;  Laterality: N/A;  to be done at 1330  . Cardioversion  07/12/2011    Procedure: CARDIOVERSION;  Surgeon: Pixie Casino, MD;  Location: [facility] ENDOSCOPY;  Service: Cardiovascular;  Laterality: N/A;    Family History  Problem Relation Age of Onset  . Diabetes Mother   . Diabetes Son   . Pancreatic cancer      GRANDFATHER  . Colon cancer      INTESTINAL, GRANDMOTHER   Social History:  reports that he has never smoked. He does not have any smokeless tobacco history on file. He reports that he drinks about 1.2 ounces of alcohol per week. He reports that he does not use illicit drugs.  Allergies: No  Known Allergies  Medications:                                                                                                                           I have reviewed the patient's current medications.  ROS:                                                                                                                                       History obtained from the patient and chart review.  General ROS: negative for - chills, fatigue, fever, night sweats, weight gain or weight loss Psychological ROS: negative for - behavioral disorder, hallucinations, memory difficulties, mood swings or suicidal ideation Ophthalmic ROS: negative for - blurry vision, double vision, eye pain or loss of vision ENT ROS: negative for - epistaxis, nasal discharge, oral lesions, sore throat, tinnitus or vertigo Allergy and Immunology ROS: negative for - hives or itchy/watery eyes Hematological and Lymphatic ROS: negative for - bleeding problems, bruising or swollen lymph nodes Endocrine ROS: negative for - galactorrhea, hair pattern changes, polydipsia/polyuria or temperature intolerance Respiratory ROS: negative for - cough, hemoptysis, shortness of breath or wheezing Cardiovascular  ROS: negative for - chest pain, dyspnea on exertion, edema or irregular heartbeat Gastrointestinal ROS: negative for - abdominal pain, diarrhea, hematemesis, nausea/vomiting or stool incontinence Genito-Urinary ROS: negative for - dysuria, hematuria, incontinence or urinary frequency/urgency Musculoskeletal ROS: negative for - joint swelling or muscular weakness Neurological ROS: as noted in HPI Dermatological ROS: negative for rash and skin lesion changes  Physical exam: pleasant male in no apparent distress. Blood pressure 198/87, pulse 60, temperature 97.6 F (36.4 C), temperature source Oral, resp. rate 22, height _0  (1.727 m), weight 99.247 kg (218 lb 12.8 oz), SpO2 98.00%. Head: normocephalic. Neck: supple, no bruits, no JVD. Cardiac: no murmurs. Lungs: clear. Abdomen: soft, no tender, no mass. Extremities: no edema.  Neurologic Examination:  Mental Status: Alert, oriented, thought content appropriate. Mild dysarthria without evidence of aphasia.  Able to follow 3 step commands without difficulty. Cranial Nerves: II: Discs flat bilaterally; Visual fields grossly normal, pupils equal, round, reactive to light and accommodation III,IV, VI: ptosis not present, extra-ocular motions intact bilaterally V,VII: smile symmetric, facial light touch sensation normal bilaterally VIII: hearing normal bilaterally IX,X: gag reflex present XI: bilateral shoulder shrug XII: midline tongue extension without atrophy or fasciculations Motor: Right : Upper extremity   5/5    Left:     Upper extremity   5/5  Lower extremity   5/5     Lower extremity   5/5 Tone and bulk:normal tone throughout; no atrophy noted Sensory: Pinprick and light touch intact throughout, bilaterally Deep Tendon Reflexes:  Right: Upper Extremity   Left: Upper extremity   biceps (C-5 to C-6) 2/4   biceps (C-5 to C-6)  2/4 tricep (C7) 2/4    triceps (C7) 2/4 Brachioradialis (C6) 2/4  Brachioradialis (C6) 2/4  Lower Extremity Lower Extremity  quadriceps (L-2 to L-4) 2/4   quadriceps (L-2 to L-4) 2/4 Achilles (S1) 2/4   Achilles (S1) 2/4  Plantars: Right: downgoing   Left: downgoing Cerebellar: normal finger-to-nose,  normal heel-to-shin test Gait: No tested  Results for orders placed during the hospital encounter of 09/02/13 (from the past 48 hour(s))  CBC     Status: None   Collection Time    09/02/13  4:46 PM      Result Value Ref Range   WBC 7.5  4.0 - 10.5 K/uL   RBC 4.86  4.22 - 5.81 MIL/uL   Hemoglobin 15.6  13.0 - 17.0 g/dL   HCT 44.2  39.0 - 52.0 %   MCV 90.9  78.0 - 100.0 fL   MCH 32.1  26.0 - 34.0 pg   MCHC 35.3  30.0 - 36.0 g/dL   RDW 12.1  11.5 - 15.5 %   Platelets 233  150 - 400 K/uL  DIFFERENTIAL     Status: None   Collection Time    09/02/13  4:46 PM      Result Value Ref Range   Neutrophils Relative % 65  43 - 77 %   Neutro Abs 4.9  1.7 - 7.7 K/uL   Lymphocytes Relative 23  12 - 46 %   Lymphs Abs 1.8  0.7 - 4.0 K/uL   Monocytes Relative 9  3 - 12 %   Monocytes Absolute 0.7  0.1 - 1.0 K/uL   Eosinophils Relative 2  0 - 5 %   Eosinophils Absolute 0.2  0.0 - 0.7 K/uL   Basophils Relative 1  0 - 1 %   Basophils Absolute 0.0  0.0 - 0.1 K/uL  COMPREHENSIVE METABOLIC PANEL     Status: Abnormal   Collection Time    09/02/13  4:46 PM      Result Value Ref Range   Sodium 142  137 - 147 mEq/L   Potassium 4.4  3.7 - 5.3 mEq/L   Chloride 104  96 - 112 mEq/L   CO2 27  19 - 32 mEq/L   Glucose, Bld 186 (*) 70 - 99 mg/dL   BUN 19  6 - 23 mg/dL   Creatinine, Ser 1.02  0.50 - 1.35 mg/dL   Calcium 10.1  8.4 - 10.5 mg/dL   Total Protein 7.4  6.0 - 8.3 g/dL   Albumin 4.3  3.5 - 5.2 g/dL   AST 29  0 -  37 U/L   ALT 36  0 - 53 U/L   Alkaline Phosphatase 74  39 - 117 U/L   Total Bilirubin 0.6  0.3 - 1.2 mg/dL   GFR calc non Af Amer 71 (*) >90 mL/min   GFR calc Af Amer 83 (*) >90  mL/min   Comment: (NOTE)     The eGFR has been calculated using the CKD EPI equation.     This calculation has not been validated in all clinical situations.     eGFR's persistently <90 mL/min signify possible Chronic Kidney     Disease.  CBG MONITORING, ED     Status: Abnormal   Collection Time    09/02/13  4:57 PM      Result Value Ref Range   Glucose-Capillary 174 (*) 70 - 99 mg/dL   Comment 1 Documented in Chart     Comment 2 Notify RN    Randolm Idol, ED     Status: None   Collection Time    09/02/13  5:05 PM      Result Value Ref Range   Troponin i, poc 0.01  0.00 - 0.08 ng/mL   Comment 3            Comment: Due to the release kinetics of cTnI,     a negative result within the first hours     of the onset of symptoms does not rule out     myocardial infarction with certainty.     If myocardial infarction is still suspected,     repeat the test at appropriate intervals.  I-STAT CHEM 8, ED     Status: Abnormal   Collection Time    09/02/13  5:06 PM      Result Value Ref Range   Sodium 145  137 - 147 mEq/L   Potassium 4.5  3.7 - 5.3 mEq/L   Chloride 100  96 - 112 mEq/L   BUN 21  6 - 23 mg/dL   Creatinine, Ser 1.20  0.50 - 1.35 mg/dL   Glucose, Bld 185 (*) 70 - 99 mg/dL   Calcium, Ion 1.30  1.13 - 1.30 mmol/L   TCO2 30  0 - 100 mmol/L   Hemoglobin 16.7  13.0 - 17.0 g/dL   HCT 49.0  39.0 - 52.0 %  PROTIME-INR     Status: None   Collection Time    09/02/13  6:05 PM      Result Value Ref Range   Prothrombin Time 13.5  11.6 - 15.2 seconds   INR 1.05  0.00 - 1.49  APTT     Status: None   Collection Time    09/02/13  6:05 PM      Result Value Ref Range   aPTT 37  24 - 37 seconds   Comment:            IF BASELINE aPTT IS ELEVATED,     SUGGEST PATIENT RISK ASSESSMENT     BE USED TO DETERMINE APPROPRIATE     ANTICOAGULANT THERAPY.  URINALYSIS, ROUTINE W REFLEX MICROSCOPIC     Status: Abnormal   Collection Time    09/02/13  8:17 PM      Result Value Ref Range    Color, Urine YELLOW  YELLOW   APPearance CLEAR  CLEAR   Specific Gravity, Urine 1.012  1.005 - 1.030   pH 7.0  5.0 - 8.0   Glucose, UA 250 (*) NEGATIVE mg/dL   Hgb urine dipstick  NEGATIVE  NEGATIVE   Bilirubin Urine NEGATIVE  NEGATIVE   Ketones, ur NEGATIVE  NEGATIVE mg/dL   Protein, ur NEGATIVE  NEGATIVE mg/dL   Urobilinogen, UA 1.0  0.0 - 1.0 mg/dL   Nitrite NEGATIVE  NEGATIVE   Leukocytes, UA NEGATIVE  NEGATIVE   Comment: MICROSCOPIC NOT DONE ON URINES WITH NEGATIVE PROTEIN, BLOOD, LEUKOCYTES, NITRITE, OR GLUCOSE <1000 mg/dL.  GLUCOSE, CAPILLARY     Status: Abnormal   Collection Time    09/02/13  8:50 PM      Result Value Ref Range   Glucose-Capillary 134 (*) 70 - 99 mg/dL   Comment 1 Notify RN     Comment 2 Documented in Chart     Ct Head (brain) Wo Contrast  09/02/2013   CLINICAL DATA:  Altered mental status. Prior cerebral vascular accident.  EXAM: CT HEAD WITHOUT CONTRAST  TECHNIQUE: Contiguous axial images were obtained from the base of the skull through the vertex without intravenous contrast.  COMPARISON:  Head CT 12/26/2011  FINDINGS: No acute intracranial hemorrhage. No focal mass lesion. No CT evidence of acute infarction. No midline shift or mass effect. No hydrocephalus. Basilar cisterns are patent. There is extensive periventricular and white matter hypodensities. There are two cortical hypodensities in the left frontal lobe unchanged from prior.  Paranasal sinuses and mastoid air cells are clear. There is mild polypoid mucosal thickening in the right maxillary sinus.  IMPRESSION: 1. No acute intracranial findings. 2. Remote infarctions in the left frontal lobe and extensive white matter microvascular disease.   Electronically Signed   By: Suzy Bouchard M.D.   On: 09/02/2013 17:32    Assessment: 73 y.o. male with multiple risk factors for stroke, admitted to [facility] due to concerns for declining neuro status, confusion, and mild worsening dysarthria.  His neuro-exam is  significant for mild dysarthria but mental status at the time of my exam was unremarkable. Although his neuro-exam is not particularly revealing at this moment, agree with pursing MRI/MRA brain to ruled out stroke and making further decisions accordingly. Continue pradaxa. Patient also taking aspirin which can increase risk of cerebral bleeding and thus will advise to discontinue aspirin if feasible. Will follow up.  Dorian Pod, MD [facility] [phone]  09/02/2013, 9:12 PM

## 2013-09-03 ENCOUNTER — Ambulatory Visit: Payer: Medicare HMO | Admitting: Neurology

## 2013-09-03 ENCOUNTER — Ambulatory Visit: Payer: Self-pay | Admitting: Internal Medicine

## 2013-09-03 ENCOUNTER — Observation Stay (HOSPITAL_COMMUNITY): Payer: Medicare HMO

## 2013-09-03 DIAGNOSIS — R279 Unspecified lack of coordination: Secondary | ICD-10-CM

## 2013-09-03 DIAGNOSIS — I359 Nonrheumatic aortic valve disorder, unspecified: Secondary | ICD-10-CM

## 2013-09-03 LAB — LIPID PANEL
Cholesterol: 119 mg/dL (ref 0–200)
HDL: 47 mg/dL (ref >39)
LDL Cholesterol: 51 mg/dL (ref 0–99)
Total CHOL/HDL Ratio: 2.5 RATIO
Triglycerides: 105 mg/dL (ref <150)
VLDL: 21 mg/dL (ref 0–40)

## 2013-09-03 LAB — URINE CULTURE
Colony Count: NO GROWTH
Culture: NO GROWTH

## 2013-09-03 LAB — GLUCOSE, CAPILLARY
Glucose-Capillary: 189 mg/dL — ABNORMAL HIGH (ref 70–99)
Glucose-Capillary: 216 mg/dL — ABNORMAL HIGH (ref 70–99)
Glucose-Capillary: 167 mg/dL — ABNORMAL HIGH (ref 70–99)

## 2013-09-03 LAB — HEMOGLOBIN A1C
Hgb A1c MFr Bld: 8.1 % — ABNORMAL HIGH (ref <5.7)
Mean Plasma Glucose: 186 mg/dL — ABNORMAL HIGH (ref <117)

## 2013-09-03 NOTE — Evaluation (Signed)
Speech Language Pathology Evaluation Patient Details Name: [name] MRN: [id number] DOB: [birth year] Today's Date: 09/03/2013 Time: 1100-1140 SLP Time Calculation (min): 40 min  Problem List:  Patient Active Problem List   Diagnosis Date Noted  . Confusion 09/02/2013  . RMS[facility] spotted fever) 10/30/2011  . Cellulitis of left leg 10/29/2011  . Anemia 10/28/2011  . Thrombocytopenia 10/28/2011  . PNA (pneumonia) 10/27/2011  . Febrile illness, acute 10/26/2011  . Cough 10/26/2011  . Diarrhea 10/26/2011  . Dehydration 10/26/2011  . Atrial fibrillation 05/15/2011  . DM type 2 (diabetes mellitus, type 2) 05/15/2011  . Hyperlipemia 05/15/2011  . Obesity 05/15/2011  . Benign hypertension 05/15/2011  . H/O: stroke 05/15/2011   Past Medical History:  Past Medical History  Diagnosis Date  . Hypertension   . Diabetes mellitus   . 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 Stroke 2010/2013    Right Brain stroke  . Sleep apnea     cpap  . Shortness of breath     exertion  . Depression   . Atrial fibrillation   . GERD (gastroesophageal reflux disease)   . Obesity   . 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  . Cholelithiasis 10/2011    Per CT. No cholecystitis radiographically.  . Hemorrhoids 2008  . Hyperlipidemia    Past Surgical History:  Past Surgical History  Procedure Laterality Date  . Knee surgery    . Tonsillectomy    . Colonoscopy    . Tee without cardioversion  07/12/2011    Procedure: TRANSESOPHAGEAL ECHOCARDIOGRAM (TEE);  Surgeon: Pixie Casino, MD;  Location: [facility] ENDOSCOPY;  Service: Cardiovascular;  Laterality: N/A;  to be done at 1330  . Cardioversion  07/12/2011    Procedure: CARDIOVERSION;  Surgeon: Pixie Casino, MD;  Location: [facility] ENDOSCOPY;  Service: Cardiovascular;  Laterality: N/A;   HPI:  73 year old male admitted 09/02/13 due to increased confusion and decreased balance.  PMH significant for CVA x4 (2010, 2 in 2012, 2013), HTN, DM, HLD, GERD. Head CT negative, MRI pending.  SLE ordered to evaluate cognition due to confusion.   Assessment / Plan / Recommendation Clinical Impression  The Montreal Cognitive Assessment was administered due to report of confusion precipitating admission. Pt scored 18/30 on MoCA , where normal is 26+/30.  Difficulty in visuospatial/executive, immediate and delayed recall, serial subtraction, and mental flexibility subtests.  Pt accurate for naming, digit repetition, sentence repetition, and orientation to person, place, and time. He was uncertain of the reason for hospitalization.  MRI is currently pending, so at this time it is difficulty to ascertain if current deficits are due to new CVA, or a progressive neurological disorder.  Pt reports family is available 24/7 for supervision.  Pt would benefit from ST intervention once at home in his familiar environment to improve functional cognitive status and facilitate safety and independence in the home.  Will follow acutely for cognitive retraining, and recommend follow up at next venue as well    SLP Assessment  Patient needs continued Speech Lanaguage Pathology Services    Follow Up Recommendations  Home health SLP;24 hour supervision/assistance    Frequency and Duration min 1 x/week  1 week   Pertinent Vitals/Pain VSS, no pain reported   SLP Goals  SLP Goals Potential to Achieve Goals: Fair Potential Considerations: Ability to learn/carryover information;Family/community support;Previous level of function;Cooperation/participation level  SLP Evaluation Prior Functioning  Cognitive/Linguistic Baseline: Baseline deficits Baseline deficit details: anticipated - no family present to confirm. Pt has had several CVA's in the past. Type of Home: House  Lives With: Spouse Available Help at Discharge: Family;Available 24 hours/day (per pt) Education: 2 years of college Vocation:  Retired   Associate Professor  Overall Cognitive Status: No family/caregiver present to determine baseline cognitive functioning Arousal/Alertness: Awake/alert Orientation Level: Oriented to person;Oriented to place;Oriented to time;Disoriented to situation Attention: Focused;Sustained;Selective Focused Attention: Appears intact Sustained Attention: Impaired Sustained Attention Impairment: Verbal basic;Functional basic Selective Attention: Impaired Selective Attention Impairment: Verbal basic;Functional basic Memory: Impaired Memory Impairment: Storage deficit;Retrieval deficit;Decreased recall of new information;Decreased short term memory Decreased Short Term Memory: Verbal basic Awareness: Impaired Problem Solving: Impaired Problem Solving Impairment: Verbal basic Executive Function: Reasoning;Organizing Reasoning: Impaired Reasoning Impairment: Verbal basic Organizing: Impaired Organizing Impairment: Verbal basic Safety/Judgment: Impaired    Comprehension  Auditory Comprehension Overall Auditory Comprehension: Appears within functional limits for tasks assessed Visual Recognition/Discrimination Discrimination: Not tested Reading Comprehension Reading Status: Not tested    Expression Expression Primary Mode of Expression: Verbal Verbal Expression Overall Verbal Expression: Appears within functional limits for tasks assessed Written Expression Dominant Hand: Right Written Expression: Not tested   Oral / Motor Oral Motor/Sensory Function Overall Oral Motor/Sensory Function: Impaired at baseline Lingual Strength: Reduced Motor Speech Overall Motor Speech: Impaired at baseline Respiration: Within functional limits Phonation: Normal Resonance: Within functional limits Articulation: Impaired Level of Impairment: Conversation Intelligibility: Intelligible (mild dysarthria, but fully intelligible) Motor Planning: Witnin functional limits Motor Speech Errors: Not applicable    GO Functional Assessment Tool Used: ASHA NOMS, clinical judgment Functional Limitations: Memory Memory Current Status (K7425): At least 20 percent but less than 40 percent impaired, limited or restricted Memory Goal Status (Z5638): At least 20 percent but less than 40 percent impaired, limited or restricted    B. Quentin Ore [facility], [place] [phone]  Lorre Nick 09/03/2013, 11:57 AM

## 2013-09-03 NOTE — Progress Notes (Signed)
Subjective: Patient states he continues to feel Foggy headed. He feels his speech has returned to normal.  MRI/MRA pending  Objective: Current vital signs: BP 129/63  Pulse 60  Temp(Src) 97.8 F (36.6 C) (Oral)  Resp 18  Ht 5\' 8"  (1.727 m)  Wt 99.247 kg (218 lb 12.8 oz)  BMI 33.28 kg/m2  SpO2 95% Vital signs in last 24 hours: Temp:  [97.6 F (36.4 C)-98.7 F (37.1 C)] 97.8 F (36.6 C) (06/04 1013) Pulse Rate:  [57-67] 60 (06/04 1013) Resp:  [16-22] 18 (06/04 1013) BP: (129-198)/(63-127) 129/63 mmHg (06/04 1013) SpO2:  [95 %-99 %] 95 % (06/04 1013) Weight:  [99.247 kg (218 lb 12.8 oz)] 99.247 kg (218 lb 12.8 oz) (06/03 2020)  Intake/Output from previous day: 06/03 0701 - 06/04 0700 In: -  Out: 4742 [Urine:1075] Intake/Output this shift:   Nutritional status: Carb Control  Neurologic Exam: Mental Status: Alert, oriented, thought content appropriate.  Speech fluent without evidence of aphasia.  Able to follow 3 step commands without difficulty. Cranial Nerves: II:  Visual fields grossly normal, pupils equal, round, reactive to light and accommodation III,IV, VI: ptosis not present, extra-ocular motions intact bilaterally V,VII: smile symmetric, facial light touch sensation normal bilaterally VIII: hearing normal bilaterally IX,X: gag reflex present XI: bilateral shoulder shrug XII: midline tongue extension without atrophy or fasciculations  Motor: Right : Upper extremity   5/5    Left:     Upper extremity   5/5  Lower extremity   5/5     Lower extremity   5/5 Tone and bulk:normal tone throughout; no atrophy noted Sensory: Pinprick and light touch intact throughout, bilaterally Deep Tendon Reflexes:  Right: Upper Extremity   Left: Upper extremity   biceps (C-5 to C-6) 2/4   biceps (C-5 to C-6) 2/4 tricep (C7) 2/4    triceps (C7) 2/4 Brachioradialis (C6) 2/4  Brachioradialis (C6) 2/4  Lower Extremity Lower Extremity  quadriceps (L-2 to L-4) 1/4   quadriceps (L-2  to L-4) 1/4 Achilles (S1) 1/4   Achilles (S1) 1/4  Plantars: Right: downgoing   Left: downgoing Cerebellar: normal finger-to-nose,  normal heel-to-shin test    Lab Results: Basic Metabolic Panel:  Recent Labs Lab 09/02/13 1646 09/02/13 1706  NA 142 145  K 4.4 4.5  CL 104 100  CO2 27  --   GLUCOSE 186* 185*  BUN 19 21  CREATININE 1.02 1.20  CALCIUM 10.1  --     Liver Function Tests:  Recent Labs Lab 09/02/13 1646  AST 29  ALT 36  ALKPHOS 74  BILITOT 0.6  PROT 7.4  ALBUMIN 4.3   No results found for this basename: LIPASE, AMYLASE,  in the last 168 hours No results found for this basename: AMMONIA,  in the last 168 hours  CBC:  Recent Labs Lab 09/02/13 1646 09/02/13 1706  WBC 7.5  --   NEUTROABS 4.9  --   HGB 15.6 16.7  HCT 44.2 49.0  MCV 90.9  --   PLT 233  --     Cardiac Enzymes: No results found for this basename: CKTOTAL, CKMB, CKMBINDEX, TROPONINI,  in the last 168 hours  Lipid Panel:  Recent Labs Lab 09/03/13 0540  CHOL 119  TRIG 105  HDL 47  CHOLHDL 2.5  VLDL 21  LDLCALC 51    CBG:  Recent Labs Lab 09/02/13 1657 09/02/13 2050 09/03/13 0639  GLUCAP 174* 134* 189*    Microbiology: Results for orders placed during the hospital encounter  of 10/26/11  URINE CULTURE     Status: None   Collection Time    10/26/11  2:27 PM      Result Value Ref Range Status   Specimen Description URINE, CLEAN CATCH   Final   Special Requests NONE   Final   Culture  Setup Time 10/27/2011 01:48   Final   Colony Count NO GROWTH   Final   Culture NO GROWTH   Final   Report Status 10/28/2011 FINAL   Final  CULTURE, BLOOD (ROUTINE X 2)     Status: None   Collection Time    10/26/11  4:15 PM      Result Value Ref Range Status   Specimen Description BLOOD LEFT ARM   Final   Special Requests BOTTLES DRAWN AEROBIC AND ANAEROBIC 6CC   Final   Culture NO GROWTH 5 DAYS   Final   Report Status 10/31/2011 FINAL   Final  CULTURE, BLOOD (ROUTINE X 2)      Status: None   Collection Time    10/26/11  4:30 PM      Result Value Ref Range Status   Specimen Description BLOOD LEFT HAND   Final   Special Requests BOTTLES DRAWN AEROBIC AND ANAEROBIC 6CC   Final   Culture NO GROWTH 5 DAYS   Final   Report Status 10/31/2011 FINAL   Final  CLOSTRIDIUM DIFFICILE BY PCR     Status: None   Collection Time    10/28/11  1:30 AM      Result Value Ref Range Status   C difficile by pcr NEGATIVE  NEGATIVE Final    Coagulation Studies:  Recent Labs  09/02/13 1805  LABPROT 13.5  INR 1.05    Imaging: Ct Head (brain) Wo Contrast  09/02/2013   CLINICAL DATA:  Altered mental status. Prior cerebral vascular accident.  EXAM: CT HEAD WITHOUT CONTRAST  TECHNIQUE: Contiguous axial images were obtained from the base of the skull through the vertex without intravenous contrast.  COMPARISON:  Head CT 12/26/2011  FINDINGS: No acute intracranial hemorrhage. No focal mass lesion. No CT evidence of acute infarction. No midline shift or mass effect. No hydrocephalus. Basilar cisterns are patent. There is extensive periventricular and white matter hypodensities. There are two cortical hypodensities in the left frontal lobe unchanged from prior.  Paranasal sinuses and mastoid air cells are clear. There is mild polypoid mucosal thickening in the right maxillary sinus.  IMPRESSION: 1. No acute intracranial findings. 2. Remote infarctions in the left frontal lobe and extensive white matter microvascular disease.   Electronically Signed   By: Suzy Bouchard M.D.   On: 09/02/2013 17:32    Medications:  Scheduled: . amLODipine  2.5 mg Oral Daily  . atorvastatin  40 mg Oral q1800  . benazepril  10 mg Oral Daily  . carvedilol  25 mg Oral BID WC  . dabigatran  150 mg Oral BID  . ezetimibe  10 mg Oral Daily  . fenofibrate  160 mg Oral Daily  . irbesartan  300 mg Oral Daily   And  . hydrochlorothiazide  25 mg Oral Daily  . mirabegron ER  50 mg Oral Daily  . montelukast  10 mg  Oral QHS    Assessment/Plan: 73 y.o. male with multiple risk factors for stroke, admitted to [facility] due to concerns for declining neuro status, confusion, and mild worsening dysarthria. Today patient remains feeling foggy headed but dysarthria has resolved and exam remains non-focal.  Awaiting  MRI/MRA brain.   Recommend: 1) MRI/MRA--if normal would not recommend further stroke work up.  2) Continue Pradaxa

## 2013-09-03 NOTE — Progress Notes (Signed)
UR completed 

## 2013-09-03 NOTE — Progress Notes (Signed)
Hypoglycemic Event  CBG: 55  Treatment: 15 GM carbohydrate snack  Symptoms: None  Follow-up CBG: [id number] CBG Result:105  Possible Reasons for Event: Inadequate meal intake  Comments/MD notified:K Schorr notified. No new orders at this time. Will continue to monitor.     A Messer  Remember to initiate Hypoglycemia Order Set & complete

## 2013-09-03 NOTE — Progress Notes (Signed)
  Echocardiogram 2D Echocardiogram has been performed.  Valinda Hoar 09/03/2013, 3:23 PM

## 2013-09-03 NOTE — Progress Notes (Signed)
*  PRELIMINARY RESULTS* Vascular Ultrasound Carotid Duplex (Doppler) has been completed.  Preliminary findings: Bilateral:  1-39% ICA stenosis.  Vertebral artery flow is antegrade.      Landry Mellow, RDMS, RVT  09/03/2013, 3:31 PM

## 2013-09-03 NOTE — Progress Notes (Signed)
Patient Demographics  [name], is a 73 y.o. male, DOB - 06/12/1940, [id number]  Admit date - 09/02/2013   Admitting Physician Theressa Millard, MD  Outpatient Primary MD for the patient is Sheela Stack, MD  LOS - 1   Chief Complaint  Patient presents with  . Altered Mental Status           Subjective:   [name] today has, No headache, No chest pain, No abdominal pain - No Nausea, No new weakness tingling or numbness, No Cough - SOB.     Assessment & Plan    1. Mild Dysarthria and confusion. Does have multiple risk factors for stroke and should be ruled out, head CT is unremarkable, full stroke workup underway, and your of following. Continue to monitor on tele, obtain PT, OT, speech input, A1c and lipid panel, check MRI MRA brain, echogram, carotid duplex, neurology following, ? If he is developing mild dementia. On Pradaxa which will be continued.   Lab Results  Component Value Date   HGBA1C 7.7* 10/27/2011    Lab Results  Component Value Date   CHOL 119 09/03/2013   HDL 47 09/03/2013   LDLCALC 51 09/03/2013   TRIG 105 09/03/2013   CHOLHDL 2.5 09/03/2013       2. Atrial fibrillation - on Pradaxa  which will be continued, will stop aspirin recommended by neurology, continue on Coreg.    3.DM2 - pending A1c, continue sliding scale  CBG (last 3)   Recent Labs  09/02/13 1657 09/02/13 2050 09/03/13 0639  GLUCAP 174* 134* 189*     4. Dyslipidemia. On statin along with fenofibrate which will be continued. Lipid panel stable here.    5. Essential hypertension. On comminution of Norvasc, Lotensin, Coreg along with HCTZ and Avapro which will be continued.     Code Status: Full  Family Communication: None Present  Disposition Plan: Home   Procedures CT  head, MRI MRA brain, echo gram, carotid duplex   Consults  Neuro   Medications  Scheduled Meds: . amLODipine  2.5 mg Oral Daily  . aspirin  325 mg Oral Daily  . atorvastatin  40 mg Oral q1800  . benazepril  10 mg Oral Daily  . carvedilol  25 mg Oral BID WC  . dabigatran  150 mg Oral BID  . ezetimibe  10 mg Oral Daily  . fenofibrate  160 mg Oral Daily  . irbesartan  300 mg Oral Daily   And  . hydrochlorothiazide  25 mg Oral Daily  . mirabegron ER  50 mg Oral Daily  . montelukast  10 mg Oral QHS   Continuous Infusions:  PRN Meds:.  DVT Prophylaxis  Pradaxa  Lab Results  Component Value Date   PLT 233 09/02/2013    Antibiotics   Anti-infectives   None          Objective:   Filed Vitals:   09/03/13 0400 09/03/13 0636 09/03/13 0752 09/03/13 1013  BP: 182/96 157/76 194/81 129/63  Pulse: 65 63 64 60  Temp: 98 F (36.7 C) 97.7 F (36.5 C) 97.8 F (36.6 C) 97.8 F (36.6 C)  TempSrc: Oral Oral Oral Oral  Resp: 18 18 20 18   Height:  Weight:      SpO2: 96% 95% 99% 95%    Wt Readings from Last 3 Encounters:  09/02/13 99.247 kg (218 lb 12.8 oz)  05/11/13 99.791 kg (220 lb)  01/15/13 99.791 kg (220 lb)     Intake/Output Summary (Last 24 hours) at 09/03/13 1033 Last data filed at 09/03/13 0631  Gross per 24 hour  Intake      0 ml  Output   1075 ml  Net  -1075 ml     Physical Exam  Awake Alert, Oriented X 2, No new F.N deficits, Normal affect [name],[name] Supple Neck,No JVD, No cervical lymphadenopathy appriciated.  Symmetrical Chest wall movement, Good air movement bilaterally, CTAB RRR,No Gallops,Rubs or new Murmurs, No Parasternal Heave +ve B.Sounds, Abd Soft, No tenderness, No organomegaly appriciated, No rebound - guarding or rigidity. No Cyanosis, Clubbing or edema, No new Rash or bruise      Data Review   Micro Results No results found for this or any previous visit (from the past 240 hour(s)).  Radiology Reports Ct Head (brain) Wo  Contrast  09/02/2013   CLINICAL DATA:  Altered mental status. Prior cerebral vascular accident.  EXAM: CT HEAD WITHOUT CONTRAST  TECHNIQUE: Contiguous axial images were obtained from the base of the skull through the vertex without intravenous contrast.  COMPARISON:  Head CT 12/26/2011  FINDINGS: No acute intracranial hemorrhage. No focal mass lesion. No CT evidence of acute infarction. No midline shift or mass effect. No hydrocephalus. Basilar cisterns are patent. There is extensive periventricular and white matter hypodensities. There are two cortical hypodensities in the left frontal lobe unchanged from prior.  Paranasal sinuses and mastoid air cells are clear. There is mild polypoid mucosal thickening in the right maxillary sinus.  IMPRESSION: 1. No acute intracranial findings. 2. Remote infarctions in the left frontal lobe and extensive white matter microvascular disease.   Electronically Signed   By: Suzy Bouchard M.D.   On: 09/02/2013 17:32    CBC  Recent Labs Lab 09/02/13 1646 09/02/13 1706  WBC 7.5  --   HGB 15.6 16.7  HCT 44.2 49.0  PLT 233  --   MCV 90.9  --   MCH 32.1  --   MCHC 35.3  --   RDW 12.1  --   LYMPHSABS 1.8  --   MONOABS 0.7  --   EOSABS 0.2  --   BASOSABS 0.0  --     Chemistries   Recent Labs Lab 09/02/13 1646 09/02/13 1706  NA 142 145  K 4.4 4.5  CL 104 100  CO2 27  --   GLUCOSE 186* 185*  BUN 19 21  CREATININE 1.02 1.20  CALCIUM 10.1  --   AST 29  --   ALT 36  --   ALKPHOS 74  --   BILITOT 0.6  --    ------------------------------------------------------------------------------------------------------------------ estimated creatinine clearance is 63.5 ml/min (by C-G formula based on Cr of 1.2). ------------------------------------------------------------------------------------------------------------------ No results found for this basename: HGBA1C,  in the last 72  hours ------------------------------------------------------------------------------------------------------------------  Recent Labs  09/03/13 0540  CHOL 119  HDL 47  LDLCALC 51  TRIG 105  CHOLHDL 2.5   ------------------------------------------------------------------------------------------------------------------ No results found for this basename: TSH, T4TOTAL, FREET3, T3FREE, THYROIDAB,  in the last 72 hours ------------------------------------------------------------------------------------------------------------------ No results found for this basename: VITAMINB12, FOLATE, FERRITIN, TIBC, IRON, RETICCTPCT,  in the last 72 hours  Coagulation profile  Recent Labs Lab 09/02/13 1805  INR 1.05    No results  found for this basename: DDIMER,  in the last 72 hours  Cardiac Enzymes No results found for this basename: CK, CKMB, TROPONINI, MYOGLOBIN,  in the last 168 hours ------------------------------------------------------------------------------------------------------------------ No components found with this basename: POCBNP,      Time Spent in minutes  35   Thurnell Lose M.D on 09/03/2013 at 10:33 AM  Between 7am to 7pm - Pager - [phone]  After 7pm go to [email or web address] - password [email or web address]  And look for the night coverage person covering for me after hours  [facility] Office  [phone]   **Disclaimer: This note may have been dictated with voice recognition software. Similar sounding words can inadvertently be transcribed and this note may contain transcription errors which may not have been corrected upon publication of note.**

## 2013-09-04 LAB — GLUCOSE, CAPILLARY
Glucose-Capillary: 58 mg/dL — ABNORMAL LOW (ref 70–99)
Glucose-Capillary: 105 mg/dL — ABNORMAL HIGH (ref 70–99)
Glucose-Capillary: 278 mg/dL — ABNORMAL HIGH (ref 70–99)
Glucose-Capillary: 255 mg/dL — ABNORMAL HIGH (ref 70–99)
Glucose-Capillary: 274 mg/dL — ABNORMAL HIGH (ref 70–99)

## 2013-09-04 MED ORDER — IBUPROFEN 400 MG PO TABS
400.0000 mg | ORAL_TABLET | Freq: Four times a day (QID) | ORAL | Status: DC | PRN
  Administered 2013-09-04: 400 mg via ORAL
  Filled 2013-09-04: qty 1

## 2013-09-04 MED ORDER — APIXABAN 5 MG PO TABS
5.0000 mg | ORAL_TABLET | Freq: Two times a day (BID) | ORAL | Status: DC
  Administered 2013-09-04 – 2013-09-05 (×2): 5 mg via ORAL
  Filled 2013-09-04 (×4): qty 1

## 2013-09-04 MED ORDER — APIXABAN 5 MG PO TABS: 5.0000 mg | ORAL_TABLET | Freq: Two times a day (BID) | ORAL | Status: DC

## 2013-09-04 MED ORDER — INSULIN PUMP
Freq: Three times a day (TID) | SUBCUTANEOUS | Status: DC
  Administered 2013-09-04: 1 via SUBCUTANEOUS
  Administered 2013-09-04: 4 via SUBCUTANEOUS
  Administered 2013-09-05: 10:00:00 via SUBCUTANEOUS

## 2013-09-04 NOTE — Progress Notes (Signed)
Patient changed to inpatient- acute CVA

## 2013-09-04 NOTE — Discharge Instructions (Addendum)
Follow with Primary MD Sheela Stack, MD in 7 days   Get CBC, CMP, checked 7 days by Primary MD .   Activity: As tolerated with Full fall precautions use walker/cane & assistance as needed   Disposition Home     Diet: Heart Healthy Low Carb, feeding assistance and aspiration precautions.  For Heart failure patients - Check your Weight same time everyday, if you gain over 2 pounds, or you develop in leg swelling, experience more shortness of breath or chest pain, call your Primary MD immediately. Follow Cardiac Low Salt Diet and 1.8 lit/day fluid restriction.   On your next visit with her primary care physician please Get Medicines reviewed and adjusted.  Please request your Prim.MD to go over all Hospital Tests and Procedure/Radiological results at the follow up, please get all Hospital records sent to your Prim MD by signing hospital release before you go home.   If you experience worsening of your admission symptoms, develop shortness of breath, life threatening emergency, suicidal or homicidal thoughts you must seek medical attention immediately by calling 911 or calling your MD immediately  if symptoms less severe.  You Must read complete instructions/literature along with all the possible adverse reactions/side effects for all the Medicines you take and that have been prescribed to you. Take any new Medicines after you have completely understood and accpet all the possible adverse reactions/side effects.   Do not drive and provide baby sitting services if your were admitted for syncope or siezures until you have seen by Primary MD or a Neurologist and advised to do so again.  Do not drive when taking Pain medications.    Do not take more than prescribed Pain, Sleep and Anxiety Medications  Special Instructions: If you have smoked or chewed Tobacco  in the last 2 yrs please stop smoking, stop any regular Alcohol  and or any Recreational drug use.  Wear Seat belts while  driving.   Please note  You were cared for by a hospitalist during your hospital stay. If you have any questions about your discharge medications or the care you received while you were in the hospital after you are discharged, you can call the unit and asked to speak with the hospitalist on call if the hospitalist that took care of you is not available. Once you are discharged, your primary care physician will handle any further medical issues. Please note that NO REFILLS for any discharge medications will be authorized once you are discharged, as it is imperative that you return to your primary care physician (or establish a relationship with a primary care physician if you do not have one) for your aftercare needs so that they can reassess your need for medications and monitor your lab values.  _________________________________________   Information on my medicine - ELIQUIS (apixaban)  This medication education was reviewed with me or my healthcare representative as part of my discharge preparation.    Why was Eliquis prescribed for you? Eliquis was prescribed for you to reduce the risk of a blood clot forming that can cause a stroke if you have a medical condition called atrial fibrillation (a type of irregular heartbeat).  What do You need to know about Eliquis ? Take your Eliquis TWICE DAILY - one tablet in the morning and one tablet in the evening with or without food. If you have difficulty swallowing the tablet whole please discuss with your pharmacist how to take the medication safely.  Take Eliquis exactly as prescribed by  your doctor and DO NOT stop taking Eliquis without talking to the doctor who prescribed the medication.  Stopping may increase your risk of developing a stroke.  Refill your prescription before you run out.  After discharge, you should have regular check-up appointments with your healthcare provider that is prescribing your Eliquis.  In the future your dose  may need to be changed if your kidney function or weight changes by a significant amount or as you get older.  What do you do if you miss a dose? If you miss a dose, take it as soon as you remember on the same day and resume taking twice daily.  Do not take more than one dose of ELIQUIS at the same time to make up a missed dose.  Important Safety Information A possible side effect of Eliquis is bleeding. You should call your healthcare provider right away if you experience any of the following:   Bleeding from an injury or your nose that does not stop.   Unusual colored urine (red or dark brown) or unusual colored stools (red or [place]).   Unusual bruising for unknown reasons.   A serious fall or if you hit your head (even if there is no bleeding).  Some medicines may interact with Eliquis and might increase your risk of bleeding or clotting while on Eliquis. To help avoid this, consult your healthcare provider or pharmacist prior to using any new prescription or non-prescription medications, including herbals, vitamins, non-steroidal anti-inflammatory drugs (NSAIDs) and supplements.  This website has more information on Eliquis (apixaban): [email or web address].

## 2013-09-04 NOTE — Progress Notes (Signed)
Stroke Team Progress Note  HISTORY Chief Complaint: Declining neuro-status, confusion   HPI:  [name] is an 73 y.o. 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  Unfortunately, family is not at the bedside in order to gather further clinical information.  [name] stated that for the past couple of days he has been experiencing " stroke like symptoms" that he describes as having " a foggy, cloudy head and not remembering the day of the week, as well as some deterioration of my speech".  However, he denies having focal weakness or numbness, imbalance, double vision, vertigo, HA, difficulty swallowing, or visual disturbances.  Stated that his short term memory hasn't been impaired by his previous strokes.  CT brain, serologies, and UA unimpressive today.  Date last known well: uncertain   Patient was not administered TPA secondary to uncertain LKW. He was admitted to the Internal Medicine Service for further evaluation and treatment. Neurology is consulting.  SUBJECTIVE No acute events overnight. Patient confused.   OBJECTIVE Most recent Vital Signs: Filed Vitals:   09/04/13 0053 09/04/13 0509 09/04/13 1049 09/04/13 1345  BP: 127/65 164/79 137/64 138/86  Pulse: 60 64 60 84  Temp: 98 F (36.7 C) 98 F (36.7 C) 97.8 F (36.6 C) 98.5 F (36.9 C)  TempSrc: Oral Oral Oral Oral  Resp: 18 18 18 18   Height:      Weight:      SpO2: 99% 98% 98% 98%   CBG (last 3)   Recent Labs  09/03/13 2143 09/04/13 1159 09/04/13 1634  GLUCAP 105* 278* 255*    IV Fluid Intake:     MEDICATIONS  . amLODipine  2.5 mg Oral Daily  . apixaban  5 mg Oral BID  . atorvastatin  40 mg Oral q1800  . benazepril  10 mg Oral Daily  . carvedilol  25 mg Oral BID WC  .  ezetimibe  10 mg Oral Daily  . fenofibrate  160 mg Oral Daily  . irbesartan  300 mg Oral Daily   And  . hydrochlorothiazide  25 mg Oral Daily  . insulin pump   Subcutaneous TID AC, HS, 0200  . mirabegron ER  50 mg Oral Daily  . montelukast  10 mg Oral QHS   PRN:    Diet:  Carb Control thin liquids Activity: Bathroom privileges with assistance DVT Prophylaxis:  On Apixaban  CLINICALLY SIGNIFICANT STUDIES Basic Metabolic Panel:  Recent Labs Lab 09/02/13 1646 09/02/13 1706  NA 142 145  K 4.4 4.5  CL 104 100  CO2 27  --   GLUCOSE 186* 185*  BUN 19 21  CREATININE 1.02 1.20  CALCIUM 10.1  --    Liver Function Tests:  Recent Labs Lab 09/02/13 1646  AST 29  ALT 36  ALKPHOS 74  BILITOT 0.6  PROT 7.4  ALBUMIN 4.3   CBC:  Recent Labs Lab 09/02/13 1646 09/02/13 1706  WBC 7.5  --   NEUTROABS 4.9  --   HGB 15.6 16.7  HCT 44.2 49.0  MCV 90.9  --   PLT 233  --    Coagulation:  Recent Labs Lab 09/02/13 1805  LABPROT 13.5  INR 1.05   Cardiac Enzymes: No results found for this basename: CKTOTAL, CKMB, CKMBINDEX, TROPONINI,  in the last 168 hours Urinalysis:  Recent Labs Lab 09/02/13 2017  COLORURINE YELLOW  LABSPEC 1.012  PHURINE 7.0  GLUCOSEU 250*  HGBUR NEGATIVE  BILIRUBINUR NEGATIVE  KETONESUR NEGATIVE  PROTEINUR NEGATIVE  UROBILINOGEN 1.0  NITRITE NEGATIVE  LEUKOCYTESUR NEGATIVE   Lipid Panel    Component Value Date/Time   CHOL 119 09/03/2013 0540   TRIG 105 09/03/2013 0540   HDL 47 09/03/2013 0540   CHOLHDL 2.5 09/03/2013 0540   VLDL 21 09/03/2013 0540   LDLCALC 51 09/03/2013 0540   HgbA1C  Lab Results  Component Value Date   HGBA1C 8.1* 09/02/2013    Urine Drug Screen:     Component Value Date/Time   LABOPIA NONE DETECTED 09/02/2013 2102   COCAINSCRNUR NONE DETECTED 09/02/2013 2102   LABBENZ NONE DETECTED 09/02/2013 2102   AMPHETMU NONE DETECTED 09/02/2013 2102   THCU NONE DETECTED 09/02/2013 2102   LABBARB NONE DETECTED 09/02/2013 2102    Alcohol Level:  No results found for this basename: ETH,  in the last 168 hours  Ct Head (brain) Wo Contrast  09/02/2013 IMPRESSION: 1. No acute intracranial findings. 2. Remote infarctions in the left frontal lobe and extensive white matter microvascular disease.    MRI/MRA  09/03/2013 IMPRESSION: MRI HEAD IMPRESSION:  1. Acute ischemic infarcts involving the left PCA territory including the medial left occipital lobe, left aspect of the splenium, and posterior limb of the left internal capsule/lateral left thalamus. No intracranial hemorrhage. 2. Remote left frontal lobe and right basal ganglia/corona radiata infarcts. 3. Generalized cerebral atrophy with advanced chronic microvascular ischemic disease.  MRA HEAD IMPRESSION:  1. Diminutive left P2 segment with superimposed short segment high-grade stenosis as above. 2. Short segment stenosis of approximately 50% within the proximal right M1 segment. 3. No proximal branch occlusion identified within the intracranial circulation. 4. No intracranial aneurysm.      2D Echocardiogram 09/03/13 Normal biventricular size and function. Moderate LVH. Impaired relaxation. Mild aortic stenosis and insufficiency. Mild mitral and tricuspid regurgitation.  Carotid Doppler  09/04/13 <39% stenosis bilat  EKG 09/03/13 NSR  Therapy Recommendations Home Health Speech, 24 H Assist  Physical Exam   Frail elderly male not in distress.Awake alert. Afebrile. Head is nontraumatic. Neck is supple without bruit. Hearing is normal. Cardiac exam no murmur or gallop. Lungs are clear to auscultation. Distal pulses are well felt. Neurological Exam ;  Awake  Alert oriented x1. Diminished attention, registration and recall.  Normal speech and language.eye movements full without nystagmus.fundi were not visualized. Vision acuity appears normal. Dense right homonymous hemianopia. Hearing is normal. Palatal movements are normal. Face symmetric. Tongue midline. Normal strength, tone, reflexes and  coordination except diminished fine finger movements on right side. Normal sensation. Gait deferred. ASSESSMENT/PLAN [name] is a 73 y.o. 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 Patient did not receive tPA due to outside window for administration. Imaging confirms multiple acute infarcts in the L PCA territory including occipital lobe, splenium, and posterior limb of the left internal capsule/lateral left thalamus. Infarct felt to be cardioembolic secondary to atrial fibrillation.  On pradaxa (dabigatran) prior to admission. Now on eliquis (apixaban) for secondary stroke prevention. Patient with resultant dense R homonymous hemianopsia, aphasia, confusion. Stroke work up underway.  L PCA infarcts  Carotid US: <39% stenosis bilat  Echo: Mod LVH,  normal EF.   Changed from pradaxa to apixaban for antithrombotic therapy  LDL 51, on atorvastatin 40 mg daily  HgbA1C 8.1, on insulin pump, carb modified diet  Continue amlodipine, benazepril, carvedilol, irbesartan, HCTZ   Dispo to home with [facility] day # 2  SIGNED Delbert Phenix, MSN, ANP-C, CNRN, MSCS [facility] Stroke Team [phone]  I have personally obtained a history, examined the patient, evaluated imaging results, and formulated the assessment and plan of care. I agree with the above. Antony Contras, MD   To contact Stroke Continuity provider, please refer to [email or web address] After hours, contact General Neurology

## 2013-09-04 NOTE — Discharge Summary (Addendum)
[name], is a 73 y.o. male  DOB 01-13-1941  MRN [id number].  Admission date:  09/02/2013  Admitting Physician  Theressa Millard, MD  Discharge Date:  09/05/2013   Primary MD  Sheela Stack, MD  Recommendations for primary care physician for things to follow:   Follow secondary risk factors for stroke   Admission Diagnosis  Atrial fibrillation [427.31] Benign hypertension [401.1] Ataxia [781.3] Confusion [298.9] Hyperlipemia [272.4] DM type 2 (diabetes mellitus, type 2) [250.00] H/O: stroke [V12.54]   Discharge Diagnosis  Atrial fibrillation [427.31] Benign hypertension [401.1] Ataxia [781.3] Confusion [298.9] Hyperlipemia [272.4] DM type 2 (diabetes mellitus, type 2) [250.00] H/O: stroke [V12.54]    Principal Problem:   Confusion Active Problems:   Atrial fibrillation   DM type 2 (diabetes mellitus, type 2)   Hyperlipemia   Benign hypertension   H/O: stroke      Past Medical History  Diagnosis Date  . Hypertension   . Diabetes mellitus   . 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 Stroke 2010/2013    Right Brain stroke  . Sleep apnea     cpap  . Shortness of breath     exertion  . Depression   . Atrial fibrillation   . GERD (gastroesophageal reflux disease)   . Obesity   . 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  . Cholelithiasis 10/2011    Per CT. No cholecystitis radiographically.  . Hemorrhoids 2008  . Hyperlipidemia     Past Surgical History  Procedure Laterality Date  . Knee surgery    . Tonsillectomy    . Colonoscopy    . Tee without cardioversion  07/12/2011    Procedure: TRANSESOPHAGEAL ECHOCARDIOGRAM (TEE);  Surgeon: Pixie Casino, MD;  Location: [facility] ENDOSCOPY;  Service: Cardiovascular;  Laterality: N/A;  to be  done at 1330  . Cardioversion  07/12/2011    Procedure: CARDIOVERSION;  Surgeon: Pixie Casino, MD;  Location: [facility] ENDOSCOPY;  Service: Cardiovascular;  Laterality: N/A;     Discharge Condition: Stable   Follow UP  Follow-up Information   Follow up with Sheela Stack, MD. Schedule an appointment as soon as possible for a visit in 1 week.   Specialty:  Endocrinology   Contact information:   [place] [address] [phone]       Follow up with Pixie Casino, MD. Schedule an appointment as soon as possible for a visit in 1 week.   Specialty:  Cardiology   Contact information:   [place] [address] [phone]       Follow up with Forbes Cellar, MD. Schedule an appointment as soon as possible for a visit in 2 months.   Specialties:  Neurology, Radiology   Contact information:   [place] [address] [phone]         Discharge Instructions  and  Discharge Medications  Discharge Instructions   Diet - low sodium heart healthy    Complete by:  As directed      Discharge instructions    Complete by:  As directed   Follow with Primary MD Sheela Stack, MD in 7 days   Get CBC, CMP, checked 7 days by Primary MD .   Activity: As tolerated with Full fall precautions use walker/cane & assistance as needed   Disposition Home     Diet: Heart Healthy Low Carb, feeding assistance and aspiration precautions.  For Heart failure patients - Check your Weight same time everyday, if you gain over 2 pounds, or you develop in leg swelling, experience more shortness of breath or chest pain, call your Primary MD immediately. Follow Cardiac Low Salt Diet and 1.8 lit/day fluid restriction.   On your next visit with her primary care physician please Get Medicines reviewed and adjusted.  Please request your Prim.MD to go over all Hospital Tests and Procedure/Radiological  results at the follow up, please get all Hospital records sent to your Prim MD by signing hospital release before you go home.   If you experience worsening of your admission symptoms, develop shortness of breath, life threatening emergency, suicidal or homicidal thoughts you must seek medical attention immediately by calling 911 or calling your MD immediately  if symptoms less severe.  You Must read complete instructions/literature along with all the possible adverse reactions/side effects for all the Medicines you take and that have been prescribed to you. Take any new Medicines after you have completely understood and accpet all the possible adverse reactions/side effects.   Do not drive and provide baby sitting services if your were admitted for syncope or siezures until you have seen by Primary MD or a Neurologist and advised to do so again.  Do not drive when taking Pain medications.    Do not take more than prescribed Pain, Sleep and Anxiety Medications  Special Instructions: If you have smoked or chewed Tobacco  in the last 2 yrs please stop smoking, stop any regular Alcohol  and or any Recreational drug use.  Wear Seat belts while driving.   Please note  You were cared for by a hospitalist during your hospital stay. If you have any questions about your discharge medications or the care you received while you were in the hospital after you are discharged, you can call the unit and asked to speak with the hospitalist on call if the hospitalist that took care of you is not available. Once you are discharged, your primary care physician will handle any further medical issues. Please note that NO REFILLS for any discharge medications will be authorized once you are discharged, as it is imperative that you return to your primary care physician (or establish a relationship with a primary care physician if you do not have one) for your aftercare needs so that they can reassess your need for  medications and monitor your lab values.  Follow with Primary MD Sheela Stack, MD in 7 days   Get CBC, CMP, checked 7 days by Primary MD .   Activity: As tolerated with Full fall precautions use walker/cane & assistance as needed   Disposition Home     Diet: Heart Healthy Low Carb, feeding assistance and aspiration precautions.  For Heart failure patients - Check your Weight same time everyday, if you gain over 2 pounds, or you develop in leg swelling, experience more shortness of breath or chest pain, call your Primary MD  immediately. Follow Cardiac Low Salt Diet and 1.8 lit/day fluid restriction.   On your next visit with her primary care physician please Get Medicines reviewed and adjusted.  Please request your Prim.MD to go over all Hospital Tests and Procedure/Radiological results at the follow up, please get all Hospital records sent to your Prim MD by signing hospital release before you go home.   If you experience worsening of your admission symptoms, develop shortness of breath, life threatening emergency, suicidal or homicidal thoughts you must seek medical attention immediately by calling 911 or calling your MD immediately  if symptoms less severe.  You Must read complete instructions/literature along with all the possible adverse reactions/side effects for all the Medicines you take and that have been prescribed to you. Take any new Medicines after you have completely understood and accpet all the possible adverse reactions/side effects.   Do not drive and provide baby sitting services if your were admitted for syncope or siezures until you have seen by Primary MD or a Neurologist and advised to do so again.  Do not drive when taking Pain medications.    Do not take more than prescribed Pain, Sleep and Anxiety Medications  Special Instructions: If you have smoked or chewed Tobacco  in the last 2 yrs please stop smoking, stop any regular Alcohol  and or any  Recreational drug use.  Wear Seat belts while driving.   Please note  You were cared for by a hospitalist during your hospital stay. If you have any questions about your discharge medications or the care you received while you were in the hospital after you are discharged, you can call the unit and asked to speak with the hospitalist on call if the hospitalist that took care of you is not available. Once you are discharged, your primary care physician will handle any further medical issues. Please note that NO REFILLS for any discharge medications will be authorized once you are discharged, as it is imperative that you return to your primary care physician (or establish a relationship with a primary care physician if you do not have one) for your aftercare needs so that they can reassess your need for medications and monitor your lab values.     Increase activity slowly    Complete by:  As directed             Medication List    STOP taking these medications       dabigatran 150 MG Caps capsule  Commonly known as:  PRADAXA      TAKE these medications       amLODipine 2.5 MG tablet  Commonly known as:  NORVASC  Take 2.5 mg by mouth daily.     apixaban 5 MG Tabs tablet  Commonly known as:  ELIQUIS  Take 1 tablet (5 mg total) by mouth 2 (two) times daily.     benazepril 10 MG tablet  Commonly known as:  LOTENSIN  Take 10 mg by mouth daily.     carvedilol 12.5 MG tablet  Commonly known as:  COREG  Take 2 tablets (25 mg total) by mouth 2 (two) times daily with a meal.     ezetimibe 10 MG tablet  Commonly known as:  ZETIA  Take 10 mg by mouth daily.     Fenofibric Acid 135 MG Cpdr  Take 1 capsule by mouth daily.     montelukast 10 MG tablet  Commonly known as:  SINGULAIR  Take 10 mg by  mouth daily.     MYRBETRIQ 50 MG Tb24 tablet  Generic drug:  mirabegron ER  Take 50 mg by mouth daily.     simvastatin 80 MG tablet  Commonly known as:  ZOCOR  Take 80 mg by mouth  daily.     V-GO 20 Kit  30 units of lipase by Does not apply route daily.     valsartan-hydrochlorothiazide 320-25 MG per tablet  Commonly known as:  DIOVAN-HCT  Take 1 tablet by mouth daily.          Diet and Activity recommendation: See Discharge Instructions above   Consults obtained - Neuro   Major procedures and Radiology Reports - PLEASE review detailed and final reports for all details, in brief -   Echo  - Left ventricle: The cavity size was normal. There was mild concentric hypertrophy. Systolic function was normal. The estimated ejection fraction was in the range of 60% to 65%. Wall motion was normal; there were no regional wall motion abnormalities. Doppler parameters are consistent with abnormal left ventricular relaxation (grade 1 diastolic dysfunction). Doppler parameters are consistent with elevated ventricular end-diastolic filling pressure. - Aortic valve: Trileaflet; moderately thickened, moderately calcified leaflets. Valve mobility was restricted. There was mild stenosis. There was mild regurgitation. Valve area (VTI): 1.35 cm^2. Valve area (Vmax): 1.41 cm^2. - Mitral valve: Calcified annulus. There was mild regurgitation. - Left atrium: The atrium was moderately dilated. - Right ventricle: Systolic function was normal. - Right atrium: The atrium was normal in size. - Tricuspid valve: There was mild regurgitation. - Pulmonic valve: There was no regurgitation. - Pulmonary arteries: Systolic pressure was within the normal range. - Pericardium, extracardiac: There was no pericardial effusion.  Impressions:  - Normal biventricular size and function. Moderate LVH. Impaired relaxation. Mild aortic stenosis and insufficiency. Mild mitral and tricuspid regurgitation.     Carotids  Carotid Duplex (Doppler) has been completed. Preliminary findings: Bilateral: 1-39% ICA stenosis. Vertebral artery flow is antegrade.     Ct Head (brain) Wo  Contrast  09/02/2013   CLINICAL DATA:  Altered mental status. Prior cerebral vascular accident.  EXAM: CT HEAD WITHOUT CONTRAST  TECHNIQUE: Contiguous axial images were obtained from the base of the skull through the vertex without intravenous contrast.  COMPARISON:  Head CT 12/26/2011  FINDINGS: No acute intracranial hemorrhage. No focal mass lesion. No CT evidence of acute infarction. No midline shift or mass effect. No hydrocephalus. Basilar cisterns are patent. There is extensive periventricular and white matter hypodensities. There are two cortical hypodensities in the left frontal lobe unchanged from prior.  Paranasal sinuses and mastoid air cells are clear. There is mild polypoid mucosal thickening in the right maxillary sinus.  IMPRESSION: 1. No acute intracranial findings. 2. Remote infarctions in the left frontal lobe and extensive white matter microvascular disease.   Electronically Signed   By: Suzy Bouchard M.D.   On: 09/02/2013 17:32   Mri Brain Without Contrast  09/03/2013   CLINICAL DATA:  Confusion, TIA.  EXAM: MRI HEAD WITHOUT CONTRAST  MRA HEAD WITHOUT CONTRAST  TECHNIQUE: Multiplanar, multiecho pulse sequences of the brain and surrounding structures were obtained without intravenous contrast. Angiographic images of the head were obtained using MRA technique without contrast.  COMPARISON:  Prior CT from 09/02/2013 as well as prior MRI from 06/20/2010  FINDINGS: MRI HEAD FINDINGS  Diffuse prominence of the CSF containing spaces is compatible with generalized cerebral atrophy. Scattered and confluent T2/FLAIR hyperintense foci within the periventricular and deep white matter  of both cerebral hemispheres is most compatible with advanced chronic microvascular ischemic changes. Focal areas of encephalomalacia within the left frontal lobe are compatible with remote infarct. Remote infarcts involving the right basal ganglia and right corona radiata on again noted, stable from prior.  Abnormal  restricted diffusion is seen involving the medial left occipital low and left aspect of the splenium (series 4, image 14-18. Additional foci of restricted diffusion seen within the posterior limb of the left internal capsule/lateral left thalamus. Findings are consistent with acute ischemic infarcts.  No acute intracranial hemorrhage. No mass lesion or midline shift. No extra-axial fluid collection.  Pituitary gland within normal limits. Globes and optic nerves demonstrate a normal appearance with normal signal intensity.  Calvarium demonstrates a normal appearance with normal signal intensity. Degenerative changes noted within the upper cervical spine.  Scattered polypoid T2 hyperintensity noted within the right maxillary sinus. The visualized paranasal sinuses are otherwise clear. No mastoid effusion.  MRA HEAD FINDINGS  The visualized portions of the distal cervical internal carotid arteries are widely patent with antegrade flow. The petrous, cavernous, and supra clinoid segments are symmetric in caliber bilaterally. Mild multi focal irregularity seen within the right A1 segment. The anterior communicating artery and anterior cerebral arteries are widely patent.  Focal short segment stenosis of approximately 50% seen within the proximal right M1 segment (series 204, image 9). Middle cerebral arteries are otherwise widely patent with antegrade flow without high-grade flow-limiting stenosis or proximal branch occlusion. Distal MCA branches are symmetric bilaterally. No intracranial aneurysm within the anterior circulation.  The vertebral arteries are widely patent with antegrade flow. The posterior inferior cerebral arteries are normal. Vertebrobasilar junction and basilar artery are widely patent with antegrade flow without evidence of basilar tip stenosis or aneurysm. The superior cerebellar arteries and anterior inferior cerebellar arteries are widely patent bilaterally.  The left PCA is attenuated as compared  to the contralateral right with probable high-grade short segment stenosis within the left P2 segment (series 202, image 17). The right PCA is unremarkable.  No intracranial aneurysm within the posterior circulation.  IMPRESSION: MRI HEAD IMPRESSION:  1. Acute ischemic infarcts involving the left PCA territory including the medial left occipital lobe, left aspect of the splenium, and posterior limb of the left internal capsule/lateral left thalamus. No intracranial hemorrhage. 2. Remote left frontal lobe and right basal ganglia/corona radiata infarcts. 3. Generalized cerebral atrophy with advanced chronic microvascular ischemic disease.  MRA HEAD IMPRESSION:  1. Diminutive left P2 segment with superimposed short segment high-grade stenosis as above. 2. Short segment stenosis of approximately 50% within the proximal right M1 segment. 3. No proximal branch occlusion identified within the intracranial circulation. 4. No intracranial aneurysm.   Electronically Signed   By: Jeannine Boga M.D.   On: 09/03/2013 23:52   Mr Jodene Nam Head/brain Wo Cm  09/03/2013   CLINICAL DATA:  Confusion, TIA.  EXAM: MRI HEAD WITHOUT CONTRAST  MRA HEAD WITHOUT CONTRAST  TECHNIQUE: Multiplanar, multiecho pulse sequences of the brain and surrounding structures were obtained without intravenous contrast. Angiographic images of the head were obtained using MRA technique without contrast.  COMPARISON:  Prior CT from 09/02/2013 as well as prior MRI from 06/20/2010  FINDINGS: MRI HEAD FINDINGS  Diffuse prominence of the CSF containing spaces is compatible with generalized cerebral atrophy. Scattered and confluent T2/FLAIR hyperintense foci within the periventricular and deep white matter of both cerebral hemispheres is most compatible with advanced chronic microvascular ischemic changes. Focal areas of encephalomalacia within the left  frontal lobe are compatible with remote infarct. Remote infarcts involving the right basal ganglia and right  corona radiata on again noted, stable from prior.  Abnormal restricted diffusion is seen involving the medial left occipital low and left aspect of the splenium (series 4, image 14-18. Additional foci of restricted diffusion seen within the posterior limb of the left internal capsule/lateral left thalamus. Findings are consistent with acute ischemic infarcts.  No acute intracranial hemorrhage. No mass lesion or midline shift. No extra-axial fluid collection.  Pituitary gland within normal limits. Globes and optic nerves demonstrate a normal appearance with normal signal intensity.  Calvarium demonstrates a normal appearance with normal signal intensity. Degenerative changes noted within the upper cervical spine.  Scattered polypoid T2 hyperintensity noted within the right maxillary sinus. The visualized paranasal sinuses are otherwise clear. No mastoid effusion.  MRA HEAD FINDINGS  The visualized portions of the distal cervical internal carotid arteries are widely patent with antegrade flow. The petrous, cavernous, and supra clinoid segments are symmetric in caliber bilaterally. Mild multi focal irregularity seen within the right A1 segment. The anterior communicating artery and anterior cerebral arteries are widely patent.  Focal short segment stenosis of approximately 50% seen within the proximal right M1 segment (series 204, image 9). Middle cerebral arteries are otherwise widely patent with antegrade flow without high-grade flow-limiting stenosis or proximal branch occlusion. Distal MCA branches are symmetric bilaterally. No intracranial aneurysm within the anterior circulation.  The vertebral arteries are widely patent with antegrade flow. The posterior inferior cerebral arteries are normal. Vertebrobasilar junction and basilar artery are widely patent with antegrade flow without evidence of basilar tip stenosis or aneurysm. The superior cerebellar arteries and anterior inferior cerebellar arteries are widely  patent bilaterally.  The left PCA is attenuated as compared to the contralateral right with probable high-grade short segment stenosis within the left P2 segment (series 202, image 17). The right PCA is unremarkable.  No intracranial aneurysm within the posterior circulation.  IMPRESSION: MRI HEAD IMPRESSION:  1. Acute ischemic infarcts involving the left PCA territory including the medial left occipital lobe, left aspect of the splenium, and posterior limb of the left internal capsule/lateral left thalamus. No intracranial hemorrhage. 2. Remote left frontal lobe and right basal ganglia/corona radiata infarcts. 3. Generalized cerebral atrophy with advanced chronic microvascular ischemic disease.  MRA HEAD IMPRESSION:  1. Diminutive left P2 segment with superimposed short segment high-grade stenosis as above. 2. Short segment stenosis of approximately 50% within the proximal right M1 segment. 3. No proximal branch occlusion identified within the intracranial circulation. 4. No intracranial aneurysm.   Electronically Signed   By: Jeannine Boga M.D.   On: 09/03/2013 23:52    Micro Results     Recent Results (from the past 240 hour(s))  URINE CULTURE     Status: None   Collection Time    09/02/13  8:17 PM      Result Value Ref Range Status   Specimen Description URINE, RANDOM   Final   Special Requests NONE   Final   Culture  Setup Time     Final   Value: 09/03/2013 00:40     Performed at [facility]     Final   Value: NO GROWTH     Performed at [facility]   Culture     Final   Value: NO GROWTH     Performed at [facility]   Report Status 09/03/2013 FINAL   Final  History of present illness and  Hospital Course:     Kindly see H&P for history of present illness and admission details, please review complete Labs, Consult reports and Test reports for all details in brief [name], is a 73 y.o. male, patient with history of  CVA in the  past causing expressive aphasia, hypertension, atrial fibrillation on anticoagulation with Pradaxa, type 2 diabetes mellitus, dyslipidemia who came to the hospital with 2 week history of worsening aphasia, feeling weak and confused.   His symptoms were secondary to new ischemic likely embolic CVA in the left PCA territory causing his symptoms, he was seen by neurology he was switched to Eliquis from Pradaxa, he was also on low-dose aspirin which was stopped, he will continue his statin for secondary prevention of note his LDL was less than 100, he also has underlying diabetes mellitus which is in poor control his A1c was around 8. At this time patient's symptoms have improved he will be discharged home with home speech therapy and physical therapy with close outpatient followup with PCP for secondary this rectum medication along with neurology outpatient post discharge. I have discussed his case with neurologist Dr. Leonie Man who agrees with the plan.   Diabetes light his type II. In poor control A1c 8.1, he follows with PCP/endocrinologist Dr. Forde Dandy will request his PCP to monitor his glycemic control and his CBGs closely and make necessary adjustments.   Essential hypertension and dyslipidemia. He will continue his home medications which include Norvasc, Lotensin, Zocor, Diovan and HCTZ combination unchanged.    We'll request PCP to continue monitoring his stroke risk factors and adjust medications as needed the    Note plan discussed with patient's wife and patient both bedside, they do not wish to go to CIR of rehabilitation. Home PT, speech therapy arranged. We'll inform case management for the same. He already has home walker. Wife confident that she can provide him needed support and help at home.      Today   Subjective:   [name] today has no headache,no chest abdominal pain,no new weakness tingling or numbness, feels much better wants to go home today.   Objective:   Blood  pressure 150/55, pulse 59, temperature 98 F (36.7 C), temperature source Oral, resp. rate 20, height 5' 8"  (1.727 m), weight 99.247 kg (218 lb 12.8 oz), SpO2 99.00%.   Intake/Output Summary (Last 24 hours) at 09/05/13 1048 Last data filed at 09/04/13 1700  Gross per 24 hour  Intake    240 ml  Output      0 ml  Net    240 ml    Exam Awake Alert, Oriented x 3, No new F.N deficits, Normal affect .AT,[name] Supple Neck,No JVD, No cervical lymphadenopathy appriciated.  Symmetrical Chest wall movement, Good air movement bilaterally, CTAB RRR,No Gallops,Rubs or new Murmurs, No Parasternal Heave +ve B.Sounds, Abd Soft, Non tender, No organomegaly appriciated, No rebound -guarding or rigidity. No Cyanosis, Clubbing or edema, No new Rash or bruise  Data Review   CBC w Diff: Lab Results  Component Value Date   WBC 7.5 09/02/2013   HGB 16.7 09/02/2013   HCT 49.0 09/02/2013   PLT 233 09/02/2013   LYMPHOPCT 23 09/02/2013   MONOPCT 9 09/02/2013   EOSPCT 2 09/02/2013   BASOPCT 1 09/02/2013    CMP: Lab Results  Component Value Date   NA 145 09/02/2013   K 4.5 09/02/2013   CL 100 09/02/2013   CO2 27 09/02/2013  BUN 21 09/02/2013   CREATININE 1.20 09/02/2013   PROT 7.4 09/02/2013   ALBUMIN 4.3 09/02/2013   BILITOT 0.6 09/02/2013   ALKPHOS 74 09/02/2013   AST 29 09/02/2013   ALT 36 09/02/2013  . Lab Results  Component Value Date   LIPASE 14 10/26/2011    Lab Results  Component Value Date   CHOL 119 09/03/2013   HDL 47 09/03/2013   LDLCALC 51 09/03/2013   TRIG 105 09/03/2013   CHOLHDL 2.5 09/03/2013    Lab Results  Component Value Date   HGBA1C 8.1* 09/02/2013     Total Time in preparing paper work, data evaluation and todays exam - 35 minutes  Thurnell Lose M.D on 09/05/2013 at 10:48 AM  [facility] Office  [phone]   **Disclaimer: This note may have been dictated with voice recognition software. Similar sounding words can inadvertently be transcribed and this note may contain  transcription errors which may not have been corrected upon publication of note.**

## 2013-09-04 NOTE — Progress Notes (Addendum)
Inpatient Diabetes Program Recommendations  AACE/ADA: New Consensus Statement on Inpatient Glycemic Control (2013)  Target Ranges:  Prepandial:   less than 140 mg/dL      Peak postprandial:   less than 180 mg/dL (1-2 hours)      Critically ill patients:  140 - 180 mg/dL     Results for [name], [name] (MRN [id number]) as of 09/04/2013 09:42  Ref. Range 09/03/2013 06:39 09/03/2013 11:37 09/03/2013 16:44 09/03/2013 21:10 09/03/2013 21:43  Glucose-Capillary Latest Range: 70-99 mg/dL 189 (H) 216 (H) 167 (H) 58 (L) 105 (H)     Admitted with AMS.  Has history of DM, CVA, HTN.  PCP: Dr. Reynold Bowen  Home DM Meds: V-GO insulin pump (30)   Addendum 1450pm:  Patient has V-Go (30) insulin pump that delivers 30 units of basal insulin per 24 hour period.  Pt may also bolus self with V-Go pump for elevated CBGs and for food coverage.  One click equals 2 units of rapid-acting insulin.  Though patient has some "fogginess" patient was able to verbalize to me how to fill V-Go insulin pump, how to apply pump, and how to bolus himself with the pump.  Will ask RNs to please tell patient what his CBGs are so that he can determine how much insulin to give himself with his pump.  Per pt, pt is to change his V-Go insulin pump later this afternoon.  Called Dr. Candiss Norse to discuss.  Pt to d/c home tomorrow.  Dr. Candiss Norse gave me orders to allow pt to use his insulin pump while here.    Will follow [name] RN, MSN, CDE Diabetes Coordinator Inpatient Diabetes Program Team Pager: [phone] (8a-10p)

## 2013-09-04 NOTE — Progress Notes (Signed)
ANTICOAGULATION CONSULT NOTE - Initial Consult  Pharmacy Consult for Eliquis Indication: atrial fibrillation  No Known Allergies  Patient Measurements: Height: 5\' 8"  (172.7 cm) Weight: 218 lb 12.8 oz (99.247 kg) IBW/kg (Calculated) : 68.4  Vital Signs: Temp: 97.8 F (36.6 C) (06/05 1049) Temp src: Oral (06/05 1049) BP: 137/64 mmHg (06/05 1049) Pulse Rate: 60 (06/05 1049)  Labs:  Recent Labs  09/02/13 1646 09/02/13 1706 09/02/13 1805  HGB 15.6 16.7  --   HCT 44.2 49.0  --   PLT 233  --   --   APTT  --   --  37  LABPROT  --   --  13.5  INR  --   --  1.05  CREATININE 1.02 1.20  --     Estimated Creatinine Clearance: 63.5 ml/min (by C-G formula based on Cr of 1.2).   Medical History: Past Medical History  Diagnosis Date  . Hypertension   . Diabetes mellitus   . 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 Stroke 2010/2013    Right Brain stroke  . Sleep apnea     cpap  . Shortness of breath     exertion  . Depression   . Atrial fibrillation   . GERD (gastroesophageal reflux disease)   . Obesity   . 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  . Cholelithiasis 10/2011    Per CT. No cholecystitis radiographically.  . Hemorrhoids 2008  . Hyperlipidemia     Medications:  Scheduled:  . amLODipine  2.5 mg Oral Daily  . apixaban  5 mg Oral BID  . atorvastatin  40 mg Oral q1800  . benazepril  10 mg Oral Daily  . carvedilol  25 mg Oral BID WC  . ezetimibe  10 mg Oral Daily  . fenofibrate  160 mg Oral Daily  . irbesartan  300 mg Oral Daily   And  . hydrochlorothiazide  25 mg Oral Daily  . mirabegron ER  50 mg Oral Daily  . montelukast  10 mg Oral QHS    Assessment: 73 yo M on Pradaxa PTA for afib, now s/p embolic CVA.  Neurology has recommended switching from Pradaxa to Eliquis.  Pts last dose of Pradaxa was given this morning.  Will need to start Eliquis dosing this evening.  Goal of Therapy:   Therapeutic Anticoagulation Monitor platelets by anticoagulation protocol: Yes   Plan:  D/C Pradaxa. Eliquis 5mg  PO BID to tarte 6/5 PM.  Horton Chin, Pharm.D., BCPS Clinical Pharmacist Pager [phone] 09/04/2013 1:10 PM

## 2013-09-04 NOTE — Progress Notes (Signed)
Pt with acute CVA, being D/C home but unsteady on feet. Noted PT not ordered and evaluated. Dr. Candiss Norse discharging MD paged and made aware. Received order to cancel D/C and have PT to evaluate. Carried.

## 2013-09-05 LAB — GLUCOSE, CAPILLARY
Glucose-Capillary: 333 mg/dL — ABNORMAL HIGH (ref 70–99)
Glucose-Capillary: 92 mg/dL (ref 70–99)
Glucose-Capillary: 95 mg/dL (ref 70–99)
Glucose-Capillary: 136 mg/dL — ABNORMAL HIGH (ref 70–99)

## 2013-09-05 NOTE — Care Management Note (Signed)
    Page 1 of 1   09/05/2013     2:06:24 PM CARE MANAGEMENT NOTE 09/05/2013  Patient:  [name], [name]   Account Number:  [id number]  Date Initiated:  09/05/2013  Documentation initiated by:  [facility]  Subjective/Objective Assessment:   adm: Declining neuro-status, confusion     Action/Plan:   discharge planning   Anticipated DC Date:  09/05/2013   Anticipated DC Plan:  [place]  CM consult      [facility] Choice  HOME HEALTH   Choice offered to / List presented to:  C-3 Spouse        HH arranged  HH-1 RN  [place].   Status of service:  Completed, signed off Medicare Important Message given?   (If response is "NO", the following Medicare IM given date fields will be blank) Date Medicare IM given:   Date Additional Medicare IM given:    Discharge Disposition:  [place]  Per UR Regulation:    If discussed at Long Length of Stay Meetings, dates discussed:    Comments:  09/05/13 11:45 CM spoke with pt and wife, [name] about Home health needs.  [name] states they have rolling walker, canes, wheelchair, elevated commodes and do not need any other equipment.  [name] states their family has used [facility] in the past and wishes to have them for her husband, pt, for HHPT/OT/RN/SLP.  Address and contact information verified with [name].  Referral texted to [facility] rep, Winnie. No other CM needs were communicated.  Mariane Masters, BSN, CM [phone].

## 2013-09-05 NOTE — Progress Notes (Signed)
Stroke Team Progress Note  HISTORY Chief Complaint: Declining neuro-status, confusion   HPI:  [name] is a 73 y.o. 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  Unfortunately, family was not available when initially seen by neurology in order to gather further clinical information.  [name] stated that for the past couple of days he had been experiencing " stroke like symptoms" that he described as having " a foggy, cloudy head and not remembering the day of the week, as well as some deterioration of my speech".  However, he denied having focal weakness or numbness, imbalance, double vision, vertigo, HA, difficulty swallowing, or visual disturbances.  Stated that his short term memory had not been impaired by his previous strokes.  CT brain, serologies, and UA were unimpressive.  Date last known well: uncertain   Patient was not administered TPA secondary to uncertain LKW. He was admitted to the Internal Medicine Service for further evaluation and treatment. Neurology is consulting.  SUBJECTIVE    OBJECTIVE Most recent Vital Signs: Filed Vitals:   09/04/13 2129 09/05/13 0126 09/05/13 0550 09/05/13 0945  BP: 168/87 138/78 140/73 150/55  Pulse: 60 57 58 59  Temp: 98 F (36.7 C) 98.4 F (36.9 C) 98.1 F (36.7 C) 98 F (36.7 C)  TempSrc: Oral Oral Oral Oral  Resp: 18 18 18 20   Height:      Weight:      SpO2: 97% 98% 97% 99%   CBG (last 3)   Recent Labs  09/04/13 2116 09/05/13 0256 09/05/13 0643  GLUCAP 333* 92 95    IV Fluid Intake:     MEDICATIONS  . amLODipine  2.5 mg Oral Daily  . apixaban  5 mg Oral BID  . atorvastatin  40 mg Oral q1800  . benazepril  10 mg Oral Daily  . carvedilol  25 mg Oral BID WC  . ezetimibe  10 mg  Oral Daily  . fenofibrate  160 mg Oral Daily  . irbesartan  300 mg Oral Daily   And  . hydrochlorothiazide  25 mg Oral Daily  . insulin pump   Subcutaneous TID AC, HS, 0200  . mirabegron ER  50 mg Oral Daily  . montelukast  10 mg Oral QHS   PRN:  ibuprofen  Diet:  Carb Control thin liquids Activity: Bathroom privileges with assistance DVT Prophylaxis:  On Apixaban  CLINICALLY SIGNIFICANT STUDIES Basic Metabolic Panel:   Recent Labs Lab 09/02/13 1646 09/02/13 1706  NA 142 145  K 4.4 4.5  CL 104 100  CO2 27  --   GLUCOSE 186* 185*  BUN 19 21  CREATININE 1.02 1.20  CALCIUM 10.1  --    Liver Function Tests:   Recent Labs Lab 09/02/13 1646  AST 29  ALT 36  ALKPHOS 74  BILITOT 0.6  PROT 7.4  ALBUMIN 4.3   CBC:   Recent Labs Lab 09/02/13 1646 09/02/13 1706  WBC 7.5  --   NEUTROABS 4.9  --   HGB 15.6 16.7  HCT 44.2 49.0  MCV 90.9  --   PLT 233  --    Coagulation:   Recent Labs Lab 09/02/13 1805  LABPROT 13.5  INR 1.05   Cardiac Enzymes: No results found for this basename: CKTOTAL,  CKMB, CKMBINDEX, TROPONINI,  in the last 168 hours Urinalysis:   Recent Labs Lab 09/02/13 2017  COLORURINE YELLOW  LABSPEC 1.012  PHURINE 7.0  GLUCOSEU 250*  HGBUR NEGATIVE  BILIRUBINUR NEGATIVE  KETONESUR NEGATIVE  PROTEINUR NEGATIVE  UROBILINOGEN 1.0  NITRITE NEGATIVE  LEUKOCYTESUR NEGATIVE   Lipid Panel    Component Value Date/Time   CHOL 119 09/03/2013 0540   TRIG 105 09/03/2013 0540   HDL 47 09/03/2013 0540   CHOLHDL 2.5 09/03/2013 0540   VLDL 21 09/03/2013 0540   LDLCALC 51 09/03/2013 0540   HgbA1C  Lab Results  Component Value Date   HGBA1C 8.1* 09/02/2013    Urine Drug Screen:     Component Value Date/Time   LABOPIA NONE DETECTED 09/02/2013 2102   COCAINSCRNUR NONE DETECTED 09/02/2013 2102   LABBENZ NONE DETECTED 09/02/2013 2102   AMPHETMU NONE DETECTED 09/02/2013 2102   THCU NONE DETECTED 09/02/2013 2102   LABBARB NONE DETECTED 09/02/2013 2102    Alcohol  Level: No results found for this basename: ETH,  in the last 168 hours  Ct Head (brain) Wo Contrast 09/02/2013  1. No acute intracranial findings.  2. Remote infarctions in the left frontal lobe and extensive white matter microvascular disease.    MRI/MRA 09/03/2013  MRI HEAD IMPRESSION:   1. Acute ischemic infarcts involving the left PCA territory including the medial left occipital lobe, left aspect of the splenium, and posterior limb of the left internal capsule/lateral left thalamus. No intracranial hemorrhage.  2. Remote left frontal lobe and right basal ganglia/corona radiata infarcts.  3. Generalized cerebral atrophy with advanced chronic microvascular ischemic disease.    MRA HEAD IMPRESSION:   1. Diminutive left P2 segment with superimposed short segment high-grade stenosis. 2. Short segment stenosis of approximately 50% within the proximal right M1 segment.  3. No proximal branch occlusion identified within the intracranial circulation.  4. No intracranial aneurysm.      2D Echocardiogram 09/03/13 Normal biventricular size and function. Moderate LVH. Impaired relaxation. Mild aortic stenosis and insufficiency. Mild mitral and tricuspid regurgitation.  Carotid Doppler  09/04/13 <39% stenosis bilat  EKG 09/03/13 NSR  Therapy Recommendations Home Health Speech, 24 H Assist  Physical Exam   Frail elderly male not in distress.Awake alert. Afebrile. Head is nontraumatic. Neck is supple without bruit. Hearing is normal. Cardiac exam no murmur or gallop. Lungs are clear to auscultation. Distal pulses are well felt. Neurological Exam ;  Awake  Alert oriented x1. Diminished attention, registration and recall.  Normal speech and language.eye movements full without nystagmus.fundi were not visualized. Vision acuity appears normal. Dense right homonymous hemianopia. Hearing is normal. Palatal movements are normal. Face symmetric. Tongue midline. Normal strength, tone, reflexes and coordination  except diminished fine finger movements on right side. Normal sensation. Gait deferred.   ASSESSMENT/PLAN [name] is a 73 y.o. 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 Patient did not receive tPA due to outside window for administration. Imaging confirms multiple acute infarcts in the L PCA territory including occipital lobe, splenium, and posterior limb of the left internal capsule/lateral left thalamus. Infarct felt to be cardioembolic secondary to atrial fibrillation.  On pradaxa (dabigatran) prior to admission. Now on eliquis (apixaban) for secondary stroke prevention. Patient with resultant dense R homonymous hemianopsia, aphasia, confusion. Stroke work up underway.  L  PCA infarcts   Changed from pradaxa to apixaban for antithrombotic therapy  Hyperlipidemia history  - LDL 51, on atorvastatin 40 mg daily  Diabetes mellitus - HgbA1C 8.1, on insulin pump, carb modified diet  Hypertension - Continue amlodipine, benazepril, carvedilol, irbesartan, HCTZ   Dispo to home with 24H Assist vs CIR. rehabilitation M.D. consult pending.  Stroke team will sign off. Please call for further assistance or questions.  Followup Dr. Leonie Man in 2 months.  Hospital day # 3  SIGNED Mikey Bussing [facility] Pager [phone] 09/05/2013, 10:27 AM   I have personally obtained a history, examined the patient, evaluated imaging results, and formulated the assessment and plan of care. I agree with the above.   Jim Like, DO [facility] Pager: [phone]  To contact Stroke Continuity provider, please refer to [email or web address] After hours, contact General Neurology

## 2013-09-05 NOTE — Evaluation (Signed)
Physical Therapy Evaluation Patient Details Name: [name] MRN: [id number] DOB: [birth year] Today's Date: 09/05/2013   History of Present Illness  73 y.o. 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  Imaging confirms multiple acute infarcts in the L PCA territory including occipital lobe, splenium, and posterior limb of the left internal capsule/lateral left thalamus.  Clinical Impression  Pt adm from home due to the above. Presents with decreased independence with mobility, balance deficits and difficulty with problem solving/motor planning. Pt to benefit from skilled acute PT to maximize functional mobility and address deficits indicated below. Pt very motivated to progress with therapy and increase independence. Will recommend CIR at this time, pt with great family support. Pt will benefit from OT to address ADLs and further assess visual deficits. Pt with noted new Rt visual field cut.     Follow Up Recommendations CIR    Equipment Recommendations  Rolling walker with 5" wheels    Recommendations for Other Services Rehab consult;OT consult     Precautions / Restrictions Precautions Precautions: Fall Precaution Comments: Rt visual field cut  Restrictions Weight Bearing Restrictions: No      Mobility  Bed Mobility               General bed mobility comments: not assessed pt up in chair and returned to chair   Transfers Overall transfer level: Needs assistance Equipment used: Rolling walker (2 wheeled) Transfers: Sit to/from Stand Sit to Stand: Min assist         General transfer comment: cues for hand placement and sequencing; pt with difficulty sequencing motor tasks and cues for safety; min (A) due to posterior lean    Ambulation/Gait Ambulation/Gait assistance: Min assist Ambulation Distance (Feet): 150 Feet (75 x 2 ) Assistive device: Rolling walker (2 wheeled) Gait Pattern/deviations: Step-through pattern;Drifts right/left;Staggering right;Wide base of support;Trunk flexed;Decreased stride length Gait velocity: decreased  Gait velocity interpretation: Below normal speed for age/gender General Gait Details: pt with difficulty managing RW and avoiding obstacles on Rt side; cues for upright posture and safety with gt; pt unsteady and fatigues quickly; requires (A) to maintain balance and manage RW   Stairs Stairs: Yes Stairs assistance: Mod assist Stair Management: One rail Right;Step to pattern;Forwards (handheld (A) on Lt UE) Number of Stairs: 3 General stair comments: cues for sequencing and technique; pt with difficulty sequencing tasks; requires max cues and visual instruction; wife present for education; unsteady and requires mod ( A)   Wheelchair Mobility    Modified Rankin (Stroke Patients Only) Modified Rankin (Stroke Patients Only) Pre-Morbid Rankin Score: Moderately severe disability Modified Rankin: Moderately severe disability     Balance Overall balance assessment: Needs assistance Sitting-balance support: Feet supported;No upper extremity supported Sitting balance-Leahy Scale: Fair     Standing balance support: During functional activity;Bilateral upper extremity supported Standing balance-Leahy Scale: Poor Standing balance comment: pt unsteady with +sway                              Pertinent Vitals/Pain No c/o pain     Home Living Family/patient expects to be discharged to:: Private residence Living Arrangements: Spouse/significant other Available Help at Discharge: Family;Available 24 hours/day Type of Home: House Home Access: Stairs to  enter Entrance Stairs-Rails: Right (going up ) Entrance Stairs-Number of Steps: 4 Home Layout: One level Home  Equipment: Other (comment) ("walking stick" )      Prior Function Level of Independence: Needs assistance   Gait / Transfers Assistance Needed: pt ambulated independently unless going on long walks with wife and he "may" use walking stick  ADL's / Homemaking Assistance Needed: independent with bathing; required (A) at times for donning socks and underwear         Hand Dominance   Dominant Hand: Right    Extremity/Trunk Assessment   Upper Extremity Assessment: Defer to OT evaluation (dysmetria noted with coordination testins Rt > Lt )           Lower Extremity Assessment: Overall WFL for tasks assessed      Cervical / Trunk Assessment: Other exceptions  Communication   Communication: Other (comment) (delayed and slurred speech )  Cognition Arousal/Alertness: Awake/alert Behavior During Therapy: Flat affect Overall Cognitive Status: Impaired/Different from baseline Area of Impairment: Following commands;Safety/judgement;Problem solving;Memory     Memory: Decreased short-term memory Following Commands: Follows one step commands with increased time Safety/Judgement: Decreased awareness of deficits;Decreased awareness of safety   Problem Solving: Slow processing;Decreased initiation;Difficulty sequencing;Requires verbal cues;Requires tactile cues General Comments: Rt side neglect with commands at times; difficulty processing and sequencing simple motor tasks     General Comments General comments (skin integrity, edema, etc.): Rt visual field cut; c/o "blurry vision"     Exercises        Assessment/Plan    PT Assessment Patient needs continued PT services  PT Diagnosis Abnormality of gait   PT Problem List Decreased coordination;Decreased safety awareness;Decreased knowledge of use of DME;Decreased mobility;Decreased balance;Decreased activity tolerance  PT Treatment Interventions DME instruction;Gait training;Stair training;Functional mobility  training;Therapeutic activities;Therapeutic exercise;Balance training;Neuromuscular re-education;Patient/family education   PT Goals (Current goals can be found in the Care Plan section) Acute Rehab PT Goals Patient Stated Goal: to get better and get home  PT Goal Formulation: With patient Time For Goal Achievement: 09/12/13 Potential to Achieve Goals: Good    Frequency Min 4X/week   Barriers to discharge Inaccessible home environment STE house     Co-evaluation               End of Session Equipment Utilized During Treatment: Gait belt Activity Tolerance: Patient limited by fatigue Patient left: in chair;with call bell/phone within reach;with family/visitor present Nurse Communication: Mobility status         Time: 0076-2263 PT Time Calculation (min): 26 min   Charges:   PT Evaluation $Initial PT Evaluation Tier I: 1 Procedure PT Treatments $Gait Training: 8-22 mins   PT G CodesGustavus Bryant , [place]  [phone]  09/05/2013, 9:06 AM

## 2013-09-05 NOTE — Progress Notes (Signed)
Discharge orders received. Pt for discharge home today. IV d/c'd. Pt given discharge instructions and prescriptions with verbalized understanding. Family in room to assist with discharge. Staff brought pt downstairs via wheelchair. Teresa Coombs, RN

## 2013-09-05 NOTE — Progress Notes (Signed)
Rehab Admissions Coordinator Note:  Patient was screened by Cleatrice Burke for appropriateness for an Inpatient Acute Rehab Consult per PT recommendation.    At this time, we are recommending Inpatient Rehab consult.  Audelia Acton [facility] 09/05/2013, 10:42 AM  I can be reached at [phone].

## 2013-09-15 ENCOUNTER — Ambulatory Visit (INDEPENDENT_AMBULATORY_CARE_PROVIDER_SITE_OTHER): Payer: Medicare HMO | Admitting: Cardiology

## 2013-09-15 ENCOUNTER — Encounter: Payer: Self-pay | Admitting: Cardiology

## 2013-09-15 VITALS — BP 147/78 | HR 64 | Ht 68.0 in | Wt 218.1 lb

## 2013-09-15 DIAGNOSIS — Z7901 Long term (current) use of anticoagulants: Secondary | ICD-10-CM | POA: Insufficient documentation

## 2013-09-15 DIAGNOSIS — I480 Paroxysmal atrial fibrillation: Secondary | ICD-10-CM | POA: Insufficient documentation

## 2013-09-15 DIAGNOSIS — I639 Cerebral infarction, unspecified: Secondary | ICD-10-CM | POA: Insufficient documentation

## 2013-09-15 DIAGNOSIS — E785 Hyperlipidemia, unspecified: Secondary | ICD-10-CM

## 2013-09-15 DIAGNOSIS — I4891 Unspecified atrial fibrillation: Secondary | ICD-10-CM

## 2013-09-15 DIAGNOSIS — I6350 Cerebral infarction due to unspecified occlusion or stenosis of unspecified cerebral artery: Secondary | ICD-10-CM

## 2013-09-15 DIAGNOSIS — E119 Type 2 diabetes mellitus without complications: Secondary | ICD-10-CM

## 2013-09-15 MED ORDER — APIXABAN 5 MG PO TABS: 5.0000 mg | ORAL_TABLET | Freq: Two times a day (BID) | ORAL | Status: DC

## 2013-09-15 NOTE — Assessment & Plan Note (Signed)
Uses insulin pump

## 2013-09-15 NOTE — Assessment & Plan Note (Signed)
New stroke early June of this year possibly secondary to lack of complete compliance with medications.  Now on Eliquis.

## 2013-09-15 NOTE — Assessment & Plan Note (Signed)
Treated and controlled, see most recent lipid panel in labs

## 2013-09-15 NOTE — Patient Instructions (Addendum)
**   please remember to take all your medications (don't miss any doses)  ** please schedule an appointment with Erasmo Downer (pharmacist) to talk about Eliquis  ** please schedule an appointment with Dr. Debara Pickett in 6 weeks

## 2013-09-15 NOTE — Progress Notes (Signed)
09/15/2013   PCP: Sheela Stack, MD   Chief Complaint  Patient presents with  . Follow-up    post-hospital - stroke (blood thinner changed); denies CP/shortness of breath; loss of peripheral vision, difficulty with speech    Primary Cardiologist:Dr. Alma Friendly   HPI:  73 year old married male presents today with his wife for followup post hospitalization. He has a history of 2 prior strokes in 2011 at 2012 and a history of atrial fibrillation. He has mild aortic stenosis on echo prior to this admission. He has sleep apnea on CPAP as well. He did undergo a TEE guided cardioversion in April 2013 by Dr. Debara Pickett. He has maintained sinus rhythm since that time. Patient had been on pradaxa 150 mg twice a day, but his wife believes that he had missed his evening doses for some time.  He is now on Eliquis.  He was admitted 09/02/2013 and discharged 09/06/2011 for CVA.  He was admitted by the hospitalist and seen by neurology. 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  His carotid ultrasound was less than 39% stenosis bilaterally; his echo revealed moderate LVH and normal EF, and mild aortic stenosis.  I reviewed his EKGs from the hospitalization and his telemetry reports and there was no atrial fibrillation.  Today he is here for followup and has no complaints.  He denies chest pain denies shortness of breath.  His wife is monitoring his medications and he is taking them appropriately.  Lipids during hospitalization total cholesterol 119 triglycerides 105 HDL 47 LDL 51  No Known Allergies  Current Outpatient Prescriptions  Medication Sig Dispense Refill  . amLODipine (NORVASC) 2.5 MG tablet Take 2.5 mg by mouth daily.      Marland Kitchen apixaban (ELIQUIS) 5 MG TABS tablet Take 1 tablet (5 mg total) by mouth 2 (two) times daily.  42 tablet  0  . benazepril (LOTENSIN) 10 MG tablet Take 10 mg by mouth daily.      . carvedilol (COREG) 12.5 MG tablet Take 2 tablets (25 mg  total) by mouth 2 (two) times daily with a meal.      . Choline Fenofibrate (FENOFIBRIC ACID) 135 MG CPDR Take 1 capsule by mouth daily.      Marland Kitchen ezetimibe (ZETIA) 10 MG tablet Take 10 mg by mouth daily.      . Insulin Disposable Pump (V-GO 20) KIT 30 units of lipase by Does not apply route daily.      . mirabegron ER (MYRBETRIQ) 50 MG TB24 tablet Take 50 mg by mouth daily.      . montelukast (SINGULAIR) 10 MG tablet Take 10 mg by mouth daily.      . simvastatin (ZOCOR) 80 MG tablet Take 80 mg by mouth daily.      . valsartan-hydrochlorothiazide (DIOVAN-HCT) 320-25 MG per tablet Take 1 tablet by mouth daily.       No current facility-administered medications for this visit.    Past Medical History  Diagnosis Date  . Hypertension   . Diabetes mellitus   . 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 Stroke 2010/2013    Right Brain stroke  . Sleep apnea     cpap  . Shortness of breath     exertion  . Depression   . Atrial fibrillation   . GERD (gastroesophageal reflux disease)   . Obesity   . 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  . Cholelithiasis 10/2011    Per CT. No cholecystitis radiographically.  . Hemorrhoids 2008  . Hyperlipidemia     Past Surgical History  Procedure Laterality Date  . Knee surgery    . Tonsillectomy    . Colonoscopy    . Tee without cardioversion  07/12/2011    Procedure: TRANSESOPHAGEAL ECHOCARDIOGRAM (TEE);  Surgeon: Pixie Casino, MD;  Location: [facility] ENDOSCOPY;  Service: Cardiovascular;  Laterality: N/A;  to be done at 1330  . Cardioversion  07/12/2011    Procedure: CARDIOVERSION;  Surgeon: Pixie Casino, MD;  Location: [facility] ENDOSCOPY;  Service: Cardiovascular;  Laterality: N/A;    RKV:TXLEZVG:JF colds or fevers, no weight changes Skin:no rashes or ulcers HEENT:no blurred vision, no congestion-loss of peripheral vision CV:see HPI PUL:see HPI GI:no diarrhea constipation or melena,  no indigestion GU:no hematuria, no dysuria MS:no joint pain, no claudication Neuro:no syncope, no lightheadedness Endo:+ diabetes with insulin pump, no thyroid disease  Wt Readings from Last 3 Encounters:  09/15/13 218 lb 1.6 oz (98.93 kg)  09/02/13 218 lb 12.8 oz (99.247 kg)  05/11/13 220 lb (99.791 kg)    PHYSICAL EXAM BP 147/78  Pulse 64  Ht _0  (1.727 m)  Wt 218 lb 1.6 oz (98.93 kg)  BMI 33.17 kg/m2 General:Pleasant affect, NAD Skin:Warm and dry, brisk capillary refill HEENT:normocephalic, sclera clear, mucus membranes moist Neck:supple, no JVD, no bruits  Heart:S1S2 RRR without murmur, gallup, rub or click Lungs:clear without rales, rhonchi, or wheezes TNB:ZXYD, non tender, + BS, do not palpate liver spleen or masses, insulin pump in place Ext:no lower ext edema, 2+ pedal pulses, 2+ radial pulses Neuro:alert and oriented, MAE, follows commands, + facial symmetry  EKG:SR no acute changes, 1st degree AV block  ASSESSMENT AND PLAN CVA (cerebral vascular accident) New stroke early June of this year possibly secondary to lack of complete compliance with medications.  Now on Eliquis.  PAF (paroxysmal atrial fibrillation) History of PAF in 2013 converted after TEE and cardioversion.  No further documented episodes of atrial fibrillation.  Hyperlipemia Treated and controlled, see most recent lipid panel in labs  DM type 2 (diabetes mellitus, type 2) Uses insulin pump  Anticoagulation adequate Continued eliquis, I would like he and his wife to see her pharmacist for further information.   Patient will follow with Dr. Debara Pickett in 6 weeks he has problems prior that time he will call if. I contemplated doing an event monitor but new awareness of A. fib on the patient's part and no documentation with telemetry and EKGs. We are treating A. fib with anticoagulation, patient is now aware of the importance of taking it correctly.

## 2013-09-15 NOTE — Assessment & Plan Note (Signed)
Continued eliquis, I would like he and his wife to see her pharmacist for further information.

## 2013-09-15 NOTE — Assessment & Plan Note (Signed)
History of PAF in 2013 converted after TEE and cardioversion.  No further documented episodes of atrial fibrillation.

## 2013-10-07 ENCOUNTER — Ambulatory Visit (INDEPENDENT_AMBULATORY_CARE_PROVIDER_SITE_OTHER): Payer: Medicare HMO | Admitting: Pharmacist

## 2013-10-07 DIAGNOSIS — Z7901 Long term (current) use of anticoagulants: Secondary | ICD-10-CM

## 2013-10-07 NOTE — Progress Notes (Signed)
Pt was started on Eliquis for secondary stroke prevention due to atrial fibrillation  on 09/04/13.   Pt has previously been on Coumadin.  Was switched to Pradaxa but unfortunately pt only took this once daily rather than twice daily and had a second stroke.  He was changed from Pradaxa to Eliquis during hospitalization for the stroke.    Reviewed patients medication list.  Pt is not currently on any combined P-gp and strong CYP3A4 inhibitors/inducers (ketoconazole, traconazole, ritonavir, carbamazepine, phenytoin, rifampin, [place]. Lyell's wort).  Reviewed labs.  SCr 1.02, Weight 99 kg.  Dose appropriate based on CrCl.   Hgb and HCT WNL  A full discussion of the nature of anticoagulants has been carried out.  A benefit/risk analysis has been presented to the patient, so that they understand the justification for choosing anticoagulation with Eliquis at this time.  The need for compliance is stressed.  Pt is aware to take the medication twice daily.  Side effects of potential bleeding are discussed, including unusual colored urine or stools, coughing up blood or coffee ground emesis, nose bleeds or serious fall or head trauma.  Discussed signs and symptoms of stroke. The patient should avoid any OTC items containing aspirin or ibuprofen.  Avoid alcohol consumption.   Call if any signs of abnormal bleeding.  Discussed financial obligations and resolved any difficulty in obtaining medication.  Pt was given the number for patient assistance program as well.

## 2013-10-21 ENCOUNTER — Ambulatory Visit: Payer: Medicare HMO | Admitting: Podiatry

## 2013-10-28 ENCOUNTER — Encounter: Payer: Self-pay | Admitting: Internal Medicine

## 2013-10-28 ENCOUNTER — Other Ambulatory Visit: Payer: Self-pay | Admitting: Pharmacist Clinician (PhC)/ Clinical Pharmacy Specialist

## 2013-10-28 ENCOUNTER — Ambulatory Visit (INDEPENDENT_AMBULATORY_CARE_PROVIDER_SITE_OTHER): Payer: Medicare HMO | Admitting: Internal Medicine

## 2013-10-28 VITALS — BP 148/80 | HR 57 | Ht 68.0 in | Wt 218.1 lb

## 2013-10-28 DIAGNOSIS — I10 Essential (primary) hypertension: Secondary | ICD-10-CM

## 2013-10-28 DIAGNOSIS — E785 Hyperlipidemia, unspecified: Secondary | ICD-10-CM

## 2013-10-28 DIAGNOSIS — I4891 Unspecified atrial fibrillation: Secondary | ICD-10-CM

## 2013-10-28 DIAGNOSIS — I480 Paroxysmal atrial fibrillation: Secondary | ICD-10-CM

## 2013-10-28 DIAGNOSIS — I6350 Cerebral infarction due to unspecified occlusion or stenosis of unspecified cerebral artery: Secondary | ICD-10-CM

## 2013-10-28 DIAGNOSIS — E119 Type 2 diabetes mellitus without complications: Secondary | ICD-10-CM

## 2013-10-28 DIAGNOSIS — I639 Cerebral infarction, unspecified: Secondary | ICD-10-CM

## 2013-10-28 MED ORDER — APIXABAN 5 MG PO TABS: 5.0000 mg | ORAL_TABLET | Freq: Two times a day (BID) | ORAL | Status: DC

## 2013-10-28 NOTE — Progress Notes (Signed)
10/28/2013   PCP: Sheela Stack, MD   Chief Complaint  Patient presents with  . Follow-up    1 month visit (saw Mickel Baas in June after stroke) - a little dizziness in morning    Primary Cardiologist:Dr. Alma Friendly   HPI:  73 year old married male presents today with his wife for followup post hospitalization. He has a history of 2 prior strokes in 2011 at 2012 and a history of atrial fibrillation. He has mild aortic stenosis on echo prior to this admission. He has sleep apnea on CPAP as well. He did undergo a TEE guided cardioversion in April 2013 by Dr. Debara Pickett. He has maintained sinus rhythm since that time. Patient had been on pradaxa 150 mg twice a day, but his wife believes that he had missed his evening doses for some time.  He is now on Eliquis.  He was admitted 09/02/2013 and discharged 09/06/2011 for CVA.  He was admitted by the hospitalist and seen by neurology. 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  His carotid ultrasound was less than 39% stenosis bilaterally; his echo revealed moderate LVH and normal EF, and mild aortic stenosis.  I reviewed his EKGs from the hospitalization and his telemetry reports and there was no atrial fibrillation.  Mr. Hazzard returns for followup today. He was recently seen by Mickel Baas our nurse practitioner. This is following a hospitalization for another stroke. It turns out that he may not have been taking his anticoagulation as prescribed. He is now clearly taking the Eliquis twice-daily.  Unfortunately suffered some vision problems from the stroke and is scheduled to see a neurologist as well as his ophthalmologist in the near future./  No Known Allergies  Current Outpatient Prescriptions  Medication Sig Dispense Refill  . amLODipine (NORVASC) 2.5 MG tablet Take 2.5 mg by mouth daily.      Marland Kitchen apixaban (ELIQUIS) 5 MG TABS tablet Take 1 tablet (5 mg total) by mouth 2 (two) times daily.  42 tablet  0  . benazepril  (LOTENSIN) 10 MG tablet Take 10 mg by mouth daily.      . carvedilol (COREG) 12.5 MG tablet Take 2 tablets (25 mg total) by mouth 2 (two) times daily with a meal.      . Choline Fenofibrate (FENOFIBRIC ACID) 135 MG CPDR Take 1 capsule by mouth daily.      Marland Kitchen ezetimibe (ZETIA) 10 MG tablet Take 10 mg by mouth daily.      . Insulin Disposable Pump (V-GO 20) KIT 30 units of lipase by Does not apply route daily.      . mirabegron ER (MYRBETRIQ) 50 MG TB24 tablet Take 50 mg by mouth daily.      . montelukast (SINGULAIR) 10 MG tablet Take 10 mg by mouth daily.      . simvastatin (ZOCOR) 80 MG tablet Take 80 mg by mouth daily.      . valsartan-hydrochlorothiazide (DIOVAN-HCT) 320-25 MG per tablet Take 1 tablet by mouth daily.       No current facility-administered medications for this visit.    Past Medical History  Diagnosis Date  . Hypertension   . Diabetes mellitus   . 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 Stroke 2010/2013    Right Brain stroke  . Sleep apnea     cpap  . Shortness of breath     exertion  . Depression   .  Atrial fibrillation   . GERD (gastroesophageal reflux disease)   . Obesity   . 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  . Cholelithiasis 10/2011    Per CT. No cholecystitis radiographically.  . Hemorrhoids 2008  . Hyperlipidemia     Past Surgical History  Procedure Laterality Date  . Knee surgery    . Tonsillectomy    . Colonoscopy    . Tee without cardioversion  07/12/2011    Procedure: TRANSESOPHAGEAL ECHOCARDIOGRAM (TEE);  Surgeon: Pixie Casino, MD;  Location: [facility] ENDOSCOPY;  Service: Cardiovascular;  Laterality: N/A;  to be done at 1330  . Cardioversion  07/12/2011    Procedure: CARDIOVERSION;  Surgeon: Pixie Casino, MD;  Location: [facility] ENDOSCOPY;  Service: Cardiovascular;  Laterality: N/A;    KCL:EXNTZGY:FV colds or fevers, no weight changes Skin:no rashes or ulcers HEENT:-loss of  peripheral vision CV:see HPI PUL:see HPI GI:no diarrhea constipation or melena, no indigestion GU:no hematuria, no dysuria MS:no joint pain, no claudication Neuro:no syncope, no lightheadedness Endo:+ diabetes with insulin pump, no thyroid disease  Wt Readings from Last 3 Encounters:  10/28/13 218 lb 1.6 oz (98.93 kg)  09/15/13 218 lb 1.6 oz (98.93 kg)  09/02/13 218 lb 12.8 oz (99.247 kg)    PHYSICAL EXAM BP 148/80  Pulse 57  Ht _0  (1.727 m)  Wt 218 lb 1.6 oz (98.93 kg)  BMI 33.17 kg/m2 General:Pleasant affect, NAD Skin:Warm and dry, brisk capillary refill HEENT:normocephalic, sclera clear, mucus membranes moist Neck:supple, no JVD, no bruits  Heart:S1S2 RRR without murmur, gallup, rub or click Lungs:clear without rales, rhonchi, or wheezes CBS:WHQP, non tender, + BS, do not palpate liver spleen or masses, insulin pump in place Ext:no lower ext edema, 2+ pedal pulses, 2+ radial pulses Neuro:alert and oriented, MAE, follows commands, + facial symmetry  EKG: Sinus bradycardia 57, incomplete left bundle pattern, nonspecific T wave changes  ASSESSMENT AND PLAN Patient Active Problem List   Diagnosis Date Noted  . CVA (cerebral vascular accident) 09/15/2013  . PAF (paroxysmal atrial fibrillation) 09/15/2013  . Anticoagulation adequate 09/15/2013  . Confusion 09/02/2013  . RMSF [facility] spotted fever) 10/30/2011  . Cellulitis of left leg 10/29/2011  . Anemia 10/28/2011  . Thrombocytopenia 10/28/2011  . PNA (pneumonia) 10/27/2011  . Febrile illness, acute 10/26/2011  . Cough 10/26/2011  . Diarrhea 10/26/2011  . Dehydration 10/26/2011  . Atrial fibrillation 05/15/2011  . DM type 2 (diabetes mellitus, type 2) 05/15/2011  . Hyperlipemia 05/15/2011  . Obesity 05/15/2011  . Benign hypertension 05/15/2011  . H/O: stroke 05/15/2011   PLAN: 1.  [name] is doing fairly well unfortunately had a stroke affecting his vision Center in the brain. He has problems now  with peripheral vision. He is rhythm is maintaining sinus. He is on a Eliquis now twice daily and is clear about taking that medication. He needs to continue to work on exercise and some weight loss. He is getting excellent diabetic care by Dr. Forde Dandy. I will plan to see him back in 6 months for ongoing followup.  Pixie Casino, MD, [facility] Attending Cardiologist [place]

## 2013-10-28 NOTE — Patient Instructions (Signed)
Your physician wants you to follow-up in:  6 months. You will receive a reminder letter in the mail two months in advance. If you don't receive a letter, please call our office to schedule the follow-up appointment.   

## 2013-10-28 NOTE — Telephone Encounter (Signed)
Prescription sent to Patient assistance program for Eliquis  ([facility])

## 2013-10-30 ENCOUNTER — Other Ambulatory Visit: Payer: Self-pay | Admitting: Pharmacist Clinician (PhC)/ Clinical Pharmacy Specialist

## 2013-10-30 MED ORDER — APIXABAN 5 MG PO TABS: 5.0000 mg | ORAL_TABLET | Freq: Two times a day (BID) | ORAL | Status: DC

## 2013-11-09 ENCOUNTER — Ambulatory Visit (INDEPENDENT_AMBULATORY_CARE_PROVIDER_SITE_OTHER): Payer: Medicare HMO | Admitting: Podiatry

## 2013-11-09 DIAGNOSIS — M79609 Pain in unspecified limb: Secondary | ICD-10-CM

## 2013-11-09 DIAGNOSIS — M79673 Pain in unspecified foot: Secondary | ICD-10-CM

## 2013-11-09 DIAGNOSIS — B351 Tinea unguium: Secondary | ICD-10-CM

## 2013-11-09 NOTE — Progress Notes (Signed)
Subjective:     Patient ID: [name], male   DOB: 07/26/1940, 73 y.o.   MRN: [id number]  HPI patient presents with nail disease 1-5 both feet that are painful   Review of Systems     Objective:   Physical Exam Neurovascular status unchanged with thick yellow nailbeds that are painful 1-5 both feet    Assessment:     Mycotic nail infection with pain 1-5 both feet    Plan:     Debris painful nailbeds 1-5 both feet with no iatrogenic bleeding noted      

## 2013-11-23 ENCOUNTER — Ambulatory Visit (INDEPENDENT_AMBULATORY_CARE_PROVIDER_SITE_OTHER): Payer: Medicare HMO | Admitting: Neurology

## 2013-11-23 ENCOUNTER — Encounter: Payer: Self-pay | Admitting: Neurology

## 2013-11-23 VITALS — BP 121/62 | HR 56 | Ht 67.5 in | Wt 218.0 lb

## 2013-11-23 DIAGNOSIS — I639 Cerebral infarction, unspecified: Secondary | ICD-10-CM

## 2013-11-23 DIAGNOSIS — I6350 Cerebral infarction due to unspecified occlusion or stenosis of unspecified cerebral artery: Secondary | ICD-10-CM

## 2013-11-23 NOTE — Progress Notes (Signed)
Chief Complaint: Post stroke follow up PCP: [name]  73y/o gentleman with multiple CVA, most recent 08/2013, presenting for post-stroke follow up. Notes he is doing overall a little better since the hospital discharge. States he continues to have difficulty with his vision. Struggles with double vision, described as vertical diplopia. His wife notes that his speech has improved, she has noted an increased ability to string his words together. Symptoms will fluctuate but overall she feels he is doing well. He has completed work with PT, OT and SLT. Wife notes some difficulty with short term memory but that is improving.   Currently taking Eliquis twice a day. Blood pressure is well controlled on current medication regimen. Currently using an insulin pump, states levels have been well controlled on the pump. Takes zocor 40m nightly.    MRI/A 08/2013: 1. Acute ischemic infarcts involving the left PCA territory  including the medial left occipital lobe, left aspect of the  splenium, and posterior limb of the left internal capsule/lateral  left thalamus. No intracranial hemorrhage.  2. Remote left frontal lobe and right basal ganglia/corona radiata  infarcts.  3. Generalized cerebral atrophy with advanced chronic microvascular  ischemic disease.  MRA HEAD IMPRESSION:  1. Diminutive left P2 segment with superimposed short segment  high-grade stenosis as above.  2. Short segment stenosis of approximately 50% within the proximal  right M1 segment.  3. No proximal branch occlusion identified within the intracranial  circulation.  4. No intracranial aneurysm.   Hospital Notes 08/2013: [name] a 73y.o. 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    Unfortunately, family was not available when initially seen by neurology in order to gather further clinical information.  [name]. BVachastated that for the past couple of days he had been experiencing " stroke like symptoms" that he described as having " a foggy, cloudy head and not remembering the day of the week, as well as some deterioration of my speech".  However, he denied having focal weakness or numbness, imbalance, double vision, vertigo, HA, difficulty swallowing, or visual disturbances.  Stated that his short term memory had not been impaired by his previous strokes.  CT brain, serologies, and UA were unimpressive.  Patient was not administered TPA secondary to uncertain LKW. He was admitted to the Internal Medicine Service for further evaluation and treatment. Neurology is consulting.  Current Outpatient Prescriptions on File Prior to Visit  Medication Sig Dispense Refill  . amLODipine (NORVASC) 2.5 MG tablet Take 2.5 mg by mouth daily.      .Marland Kitchenapixaban (ELIQUIS) 5 MG TABS tablet Take 1 tablet (5 mg total) by mouth 2 (two) times daily.  180 tablet  3  . benazepril (LOTENSIN) 10 MG tablet Take 10 mg by mouth daily.      . carvedilol (COREG) 12.5 MG tablet Take 2 tablets (25 mg total) by mouth 2 (two) times daily with a meal.      . Choline Fenofibrate (FENOFIBRIC ACID) 135 MG CPDR Take 1 capsule by mouth daily.      .Marland Kitchenezetimibe (ZETIA) 10 MG tablet Take 10 mg by mouth daily.      . Insulin Disposable Pump (V-GO 20) KIT 30 units of lipase by Does not apply route  daily.      . mirabegron ER (MYRBETRIQ) 50 MG TB24 tablet Take 50 mg by mouth daily.      . montelukast (SINGULAIR) 10 MG tablet Take 10 mg by mouth daily.      . simvastatin (ZOCOR) 80 MG tablet Take 80 mg by mouth daily.      . valsartan-hydrochlorothiazide (DIOVAN-HCT) 320-25 MG per tablet Take 1 tablet by mouth daily.       No current facility-administered medications on file prior to visit.     OBJECTIVE Most recent  Vital Signs: Filed Vitals:   11/23/13 1304  BP: 121/62  Pulse: 56  Height: 5' 7.5" (1.715 m)  Weight: 218 lb (98.884 kg)    Ct Head (brain) Wo Contrast 09/02/2013  1. No acute intracranial findings.  2. Remote infarctions in the left frontal lobe and extensive white matter microvascular disease.    MRI/MRA 09/03/2013  MRI HEAD IMPRESSION:   1. Acute ischemic infarcts involving the left PCA territory including the medial left occipital lobe, left aspect of the splenium, and posterior limb of the left internal capsule/lateral left thalamus. No intracranial hemorrhage.  2. Remote left frontal lobe and right basal ganglia/corona radiata infarcts.  3. Generalized cerebral atrophy with advanced chronic microvascular ischemic disease.    MRA HEAD IMPRESSION:   1. Diminutive left P2 segment with superimposed short segment high-grade stenosis. 2. Short segment stenosis of approximately 50% within the proximal right M1 segment.  3. No proximal branch occlusion identified within the intracranial circulation.  4. No intracranial aneurysm.      2D Echocardiogram 09/03/13 Normal biventricular size and function. Moderate LVH. Impaired relaxation. Mild aortic stenosis and insufficiency. Mild mitral and tricuspid regurgitation.  Carotid Doppler  09/04/13 <39% stenosis bilat  EKG 09/03/13 NSR  Therapy Recommendations Home Health Speech, 24 H Assist  Physical Exam   Frail elderly male not in distress.Awake alert. Afebrile. Head is nontraumatic. Neck is supple without bruit. Hearing is normal. Cardiac exam no murmur or gallop. Lungs are clear to auscultation. Distal pulses are well felt. Neurological Exam ;  Awake  Alert oriented x1. Diminished attention, registration and recall.  Mild dysarthria .eye movements full without nystagmus.fundi were not visualized. Vision acuity appears normal. Dense right homonymous hemianopia. Hearing is normal. Palatal movements are normal. Face symmetric. Tongue  midline. Normal strength, tone, reflexes and coordination except diminished fine finger movements on right side. Normal sensation. Gait deferred.   ASSESSMENT/PLAN  1)CVA 2)A fib 3)HTN 4)DM  [name] is a 73 y.o. male with pmh HTN, DM type II, HLD, atrial fibrillation on Eliquis, OSA on CPAP, cerebral infarct x 2 presents for post stroke follow up. Currently doing well, completed rehab. Is scheduled to see eye specialist at [facility] as he continues to have difficulty with diplopia. Continues on Eliquis, tolerating well. BP and blood sugars are well controlled. Continues on Crestor 12m nightly. Will not make any changes to medication at this time. Follow up as needed.   PJim Like DO

## 2014-03-13 ENCOUNTER — Emergency Department (HOSPITAL_COMMUNITY)
Admission: EM | Admit: 2014-03-13 | Discharge: 2014-03-14 | Disposition: A | Discharge status: Home / Self Care | Payer: Medicare HMO | Attending: Emergency Medicine | Admitting: Emergency Medicine

## 2014-03-13 ENCOUNTER — Emergency Department (HOSPITAL_COMMUNITY): Payer: Medicare HMO

## 2014-03-13 ENCOUNTER — Encounter (HOSPITAL_COMMUNITY): Payer: Self-pay

## 2014-03-13 DIAGNOSIS — Y998 Other external cause status: Secondary | ICD-10-CM | POA: Insufficient documentation

## 2014-03-13 DIAGNOSIS — E119 Type 2 diabetes mellitus without complications: Secondary | ICD-10-CM | POA: Diagnosis not present

## 2014-03-13 DIAGNOSIS — Y93B9 Activity, other involving muscle strengthening exercises: Secondary | ICD-10-CM | POA: Diagnosis not present

## 2014-03-13 DIAGNOSIS — Y9289 Other specified places as the place of occurrence of the external cause: Secondary | ICD-10-CM | POA: Insufficient documentation

## 2014-03-13 DIAGNOSIS — E785 Hyperlipidemia, unspecified: Secondary | ICD-10-CM | POA: Insufficient documentation

## 2014-03-13 DIAGNOSIS — Z8669 Personal history of other diseases of the nervous system and sense organs: Secondary | ICD-10-CM | POA: Diagnosis not present

## 2014-03-13 DIAGNOSIS — S299XXA Unspecified injury of thorax, initial encounter: Secondary | ICD-10-CM | POA: Insufficient documentation

## 2014-03-13 DIAGNOSIS — E669 Obesity, unspecified: Secondary | ICD-10-CM | POA: Diagnosis not present

## 2014-03-13 DIAGNOSIS — Z7902 Long term (current) use of antithrombotics/antiplatelets: Secondary | ICD-10-CM | POA: Diagnosis not present

## 2014-03-13 DIAGNOSIS — W19XXXA Unspecified fall, initial encounter: Secondary | ICD-10-CM

## 2014-03-13 DIAGNOSIS — Z79899 Other long term (current) drug therapy: Secondary | ICD-10-CM | POA: Diagnosis not present

## 2014-03-13 DIAGNOSIS — I10 Essential (primary) hypertension: Secondary | ICD-10-CM | POA: Insufficient documentation

## 2014-03-13 DIAGNOSIS — S3992XA Unspecified injury of lower back, initial encounter: Secondary | ICD-10-CM | POA: Diagnosis present

## 2014-03-13 DIAGNOSIS — S300XXA Contusion of lower back and pelvis, initial encounter: Secondary | ICD-10-CM | POA: Diagnosis not present

## 2014-03-13 DIAGNOSIS — Z8719 Personal history of other diseases of the digestive system: Secondary | ICD-10-CM | POA: Insufficient documentation

## 2014-03-13 DIAGNOSIS — Z8659 Personal history of other mental and behavioral disorders: Secondary | ICD-10-CM | POA: Diagnosis not present

## 2014-03-13 DIAGNOSIS — S20229A Contusion of unspecified back wall of thorax, initial encounter: Secondary | ICD-10-CM

## 2014-03-13 DIAGNOSIS — Z794 Long term (current) use of insulin: Secondary | ICD-10-CM | POA: Diagnosis not present

## 2014-03-13 DIAGNOSIS — Z8673 Personal history of transient ischemic attack (TIA), and cerebral infarction without residual deficits: Secondary | ICD-10-CM | POA: Diagnosis not present

## 2014-03-13 DIAGNOSIS — Z8619 Personal history of other infectious and parasitic diseases: Secondary | ICD-10-CM | POA: Diagnosis not present

## 2014-03-13 LAB — CBC WITH DIFFERENTIAL/PLATELET
Basophils Absolute: 0.1 10*3/uL (ref 0.0–0.1)
Basophils Relative: 1 % (ref 0–1)
Eosinophils Absolute: 0.2 10*3/uL (ref 0.0–0.7)
Eosinophils Relative: 2 % (ref 0–5)
HCT: 41.2 % (ref 39.0–52.0)
Hemoglobin: 13.8 g/dL (ref 13.0–17.0)
Lymphocytes Relative: 19 % (ref 12–46)
Lymphs Abs: 1.7 10*3/uL (ref 0.7–4.0)
MCH: 30.4 pg (ref 26.0–34.0)
MCHC: 33.5 g/dL (ref 30.0–36.0)
MCV: 90.7 fL (ref 78.0–100.0)
Monocytes Absolute: 0.9 10*3/uL (ref 0.1–1.0)
Monocytes Relative: 10 % (ref 3–12)
Neutro Abs: 6.2 10*3/uL (ref 1.7–7.7)
Neutrophils Relative %: 68 % (ref 43–77)
Platelets: 196 10*3/uL (ref 150–400)
RBC: 4.54 MIL/uL (ref 4.22–5.81)
RDW: 11.9 % (ref 11.5–15.5)
WBC: 9 10*3/uL (ref 4.0–10.5)

## 2014-03-13 LAB — BASIC METABOLIC PANEL
Anion gap: 13 (ref 5–15)
BUN: 20 mg/dL (ref 6–23)
CO2: 24 mEq/L (ref 19–32)
Calcium: 9.7 mg/dL (ref 8.4–10.5)
Chloride: 102 mEq/L (ref 96–112)
Creatinine, Ser: 1.07 mg/dL (ref 0.50–1.35)
GFR calc Af Amer: 78 mL/min — ABNORMAL LOW (ref >90)
GFR calc non Af Amer: 67 mL/min — ABNORMAL LOW (ref >90)
Glucose, Bld: 305 mg/dL — ABNORMAL HIGH (ref 70–99)
Potassium: 4.2 mEq/L (ref 3.7–5.3)
Sodium: 139 mEq/L (ref 137–147)

## 2014-03-13 LAB — URINALYSIS, ROUTINE W REFLEX MICROSCOPIC
Bilirubin Urine: NEGATIVE
Glucose, UA: >1000 mg/dL — AB
Ketones, ur: NEGATIVE mg/dL
Leukocytes, UA: NEGATIVE
Nitrite: NEGATIVE
Protein, ur: NEGATIVE mg/dL
Specific Gravity, Urine: 1.015 (ref 1.005–1.030)
Urobilinogen, UA: 0.2 mg/dL (ref 0.0–1.0)
pH: 6.5 (ref 5.0–8.0)

## 2014-03-13 LAB — PROTIME-INR
INR: 1.1 (ref 0.00–1.49)
Prothrombin Time: 14.3 seconds (ref 11.6–15.2)

## 2014-03-13 LAB — URINE MICROSCOPIC-ADD ON

## 2014-03-13 MED ORDER — IOHEXOL 300 MG/ML  SOLN: 100.0000 mL | Freq: Once | INTRAMUSCULAR | Status: AC | PRN

## 2014-03-13 MED ORDER — HYDROCODONE-ACETAMINOPHEN 5-325 MG PO TABS
1.0000 | ORAL_TABLET | Freq: Once | ORAL | Status: AC
  Administered 2014-03-13: 1 via ORAL
  Filled 2014-03-13: qty 1

## 2014-03-13 NOTE — ED Provider Notes (Signed)
CSN: [id number]     Arrival date & time 03/13/14  2129 History  This chart was scribed for Ezequiel Essex, MD by Jeanell Sparrow, ED Scribe. This patient was seen in room APA19/APA19 and the patient's care was started at 9:58 PM.    Chief Complaint  Patient presents with  . Fall   The history is provided by the patient and the spouse. No language interpreter was used.    HPI Comments: [name] is a 73 y.o. male who presents to the Emergency Department complaining of a fall that occurred today. He reports that he was trying to get off a exercise bike when he lost his balance and fell backwards and to the side. He states that he landed on a couch and bumped his head on a lamp. He denies any LOC. He reports that he has side and back pain. He states that he is on apixban, which is why he came to the ED today. He reports that he has a hx of DM and stroke. He denies any dizziness, or light headedness.   Past Medical History  Diagnosis Date  . Hypertension   . Diabetes mellitus   . 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 Stroke 2010/2013    Right Brain stroke  . Sleep apnea     cpap  . Shortness of breath     exertion  . Depression   . Atrial fibrillation   . GERD (gastroesophageal reflux disease)   . Obesity   . 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  . Cholelithiasis 10/2011    Per CT. No cholecystitis radiographically.  . Hemorrhoids 2008  . Hyperlipidemia    Past Surgical History  Procedure Laterality Date  . Knee surgery    . Tonsillectomy    . Colonoscopy    . Tee without cardioversion  07/12/2011    Procedure: TRANSESOPHAGEAL ECHOCARDIOGRAM (TEE);  Surgeon: Pixie Casino, MD;  Location: [facility] ENDOSCOPY;  Service: Cardiovascular;  Laterality: N/A;  to be done at 1330  . Cardioversion  07/12/2011    Procedure: CARDIOVERSION;  Surgeon: Pixie Casino, MD;  Location: [facility] ENDOSCOPY;  Service: Cardiovascular;   Laterality: N/A;   Family History  Problem Relation Age of Onset  . Diabetes Mother   . Diabetes Son   . Pancreatic cancer      GRANDFATHER  . Colon cancer      INTESTINAL, GRANDMOTHER   History  Substance Use Topics  . Smoking status: Never Smoker   . Smokeless tobacco: Never Used  . Alcohol Use: 1.2 oz/week    2 Glasses of wine per week     Comment: occasionally     Review of Systems A complete 10 system review of systems was obtained and all systems are negative except as noted in the HPI and PMH.   Allergies  Review of patient's allergies indicates no known allergies.  Home Medications   Prior to Admission medications   Medication Sig Start Date End Date Taking? Authorizing Provider  amLODipine (NORVASC) 2.5 MG tablet Take 2.5 mg by mouth daily.   Yes Historical Provider, MD  apixaban (ELIQUIS) 5 MG TABS tablet Take 1 tablet (5 mg total) by mouth 2 (two) times daily. 10/30/13  Yes Pixie Casino, MD  benazepril (LOTENSIN) 40 MG tablet Take 40 mg by mouth daily.   Yes Historical Provider, MD  carvedilol (COREG) 12.5 MG tablet Take 2  tablets (25 mg total) by mouth 2 (two) times daily with a meal. 05/16/11  Yes Delfina Redwood, MD  Choline Fenofibrate (FENOFIBRIC ACID) 135 MG CPDR Take 1 capsule by mouth daily.   Yes Historical Provider, MD  ezetimibe (ZETIA) 10 MG tablet Take 10 mg by mouth daily.   Yes Historical Provider, MD  Insulin Disposable Pump (V-GO 20) KIT 20 units of lipase by Does not apply route daily.    Yes Historical Provider, MD  mirabegron ER (MYRBETRIQ) 50 MG TB24 tablet Take 50 mg by mouth daily.   Yes Historical Provider, MD  montelukast (SINGULAIR) 10 MG tablet Take 10 mg by mouth daily.   Yes Historical Provider, MD  simvastatin (ZOCOR) 80 MG tablet Take 80 mg by mouth daily.   Yes Historical Provider, MD  valsartan-hydrochlorothiazide (DIOVAN-HCT) 320-25 MG per tablet Take 1 tablet by mouth daily.   Yes Historical Provider, MD   HYDROcodone-acetaminophen (NORCO/VICODIN) 5-325 MG per tablet Take 2 tablets by mouth every 4 (four) hours as needed. 03/14/14   Ezequiel Essex, MD   BP 166/55 mmHg  Pulse 57  Temp(Src) 97.7 F (36.5 C) (Oral)  Resp 20  Ht _0  (1.727 m)  Wt 215 lb (97.523 kg)  BMI 32.70 kg/m2  SpO2 97% Physical Exam  Constitutional: He is oriented to person, place, and time. He appears well-developed and well-nourished. No distress.  HENT:  Head: Normocephalic and atraumatic.  Mouth/Throat: Oropharynx is clear and moist. No oropharyngeal exudate.  Eyes: Conjunctivae and EOM are normal. Pupils are equal, round, and reactive to light.  Neck: Normal range of motion. Neck supple.  No meningismus.  Cardiovascular: Normal rate, regular rhythm, normal heart sounds and intact distal pulses.   No murmur heard. Pulmonary/Chest: Effort normal and breath sounds normal. No respiratory distress.  Abdominal: Soft. There is no tenderness. There is no rebound and no guarding.  Musculoskeletal: Normal range of motion. He exhibits no edema or tenderness.  TTP to bilateral lumbar spinaland left lateral ribs. No C, T, or L TTP. Left lateral ribs without ecchymosis .  Neurological: He is alert and oriented to person, place, and time. No cranial nerve deficit. He exhibits normal muscle tone. Coordination normal.  No ataxia on finger to nose bilaterally. No pronator drift. 5/5 strength throughout. CN 2-12 intact. Negative Romberg. Equal grip strength. Sensation intact. Gait is normal.   Skin: Skin is warm.  Psychiatric: He has a normal mood and affect. His behavior is normal.  Nursing note and vitals reviewed.   ED Course  Procedures (including critical care time) DIAGNOSTIC STUDIES: Oxygen Saturation is 97% on RA, normal by my interpretation.    COORDINATION OF CARE: 10:02 PM- Pt advised of plan for treatment which includes medication, radiology, and labs and pt agrees.  Labs Review Labs Reviewed  BASIC  METABOLIC PANEL - Abnormal; Notable for the following:    Glucose, Bld 305 (*)    GFR calc non Af Amer 67 (*)    GFR calc Af Amer 78 (*)    All other components within normal limits  URINALYSIS, ROUTINE W REFLEX MICROSCOPIC - Abnormal; Notable for the following:    Glucose, UA >1000 (*)    Hgb urine dipstick TRACE (*)    All other components within normal limits  CBC WITH DIFFERENTIAL  PROTIME-INR  URINE MICROSCOPIC-ADD ON    Imaging Review Ct Head Wo Contrast  03/14/2014   CLINICAL DATA:  Patient fell getting off of an exercise bike, striking his back  and head on the couch.  EXAM: CT HEAD WITHOUT CONTRAST  CT CERVICAL SPINE WITHOUT CONTRAST  TECHNIQUE: Multidetector CT imaging of the head and cervical spine was performed following the standard protocol without intravenous contrast. Multiplanar CT image reconstructions of the cervical spine were also generated.  COMPARISON:  CT head 09/02/2013.  MRI brain 09/03/2013.  FINDINGS: CT HEAD FINDINGS  Mild diffuse cerebral atrophy. Low-attenuation changes in the deep white matter consistent with central atrophy. Extensive vascular calcifications. Old encephalomalacia in the left posterior frontal lobe and left occipital lobes consistent with old infarcts. The occipital infarct is new since the previous CT study but was demonstrated in its acute phase on the MRI. No mass effect or midline shift. No abnormal extra-axial fluid collections. Gray-white matter junctions are distinct. Basal cisterns are not effaced. No evidence of acute intracranial hemorrhage. No depressed skull fractures. Visualized paranasal sinuses and mastoid air cells are not opacified.  CT CERVICAL SPINE FINDINGS  The examination is limited due to motion artifact. Straightening of the usual cervical lordosis which may be due to degenerative changes or patient positioning. However, muscle spasm or ligamentous injury can also have this appearance and are not excluded. No anterior  subluxation. Normal alignment of facet joints. C1-2 articulation appears intact except for degenerative changes. Degenerative changes throughout the cervical spine with narrowed cervical interspaces and prominent bridging anterior osteophytes throughout. There is a large disc osteophyte complex at C5-6 and a prominent disc osteophyte complex at C6-7 which likely cause encroachment upon the central canal. No vertebral compression deformities. No prevertebral soft tissue swelling. No focal bone lesion or bone destruction. Soft tissues are unremarkable. Vascular calcifications.  IMPRESSION: No acute intracranial abnormalities. Old local left frontal and occipital infarcts. Chronic atrophy and small vessel ischemic changes.  Extensive degenerative changes in the cervical spine. Nonspecific straightening of the usual cervical lordosis. No displaced fractures identified.   Electronically Signed   By: Lucienne Capers M.D.   On: 03/14/2014 00:37   Ct Chest W Contrast  03/14/2014   CLINICAL DATA:  Fall today from exercise bike, back and side pain, hit head on couch. No loss of consciousness.  EXAM: CT CHEST, ABDOMEN, AND PELVIS WITH CONTRAST  TECHNIQUE: Multidetector CT imaging of the chest, abdomen and pelvis was performed following the standard protocol during bolus administration of intravenous contrast.  CONTRAST:  160m OMNIPAQUE IOHEXOL 300 MG/ML  SOLN  COMPARISON:  CT of the chest October 29, 2011 and CT of the abdomen and pelvis October 28, 2011  FINDINGS: CT CHEST FINDINGS  Mild respiratory motion degraded examination.  MEDIASTINUM: The heart is moderately enlarged, coronary artery calcifications. No pericardial fluid collections. Thoracic aorta is normal in course and caliber with mild calcific atherosclerosis. No periaortic fluid collections. No lymphadenopathy by CT size criteria.  LUNGS: Tracheobronchial tree is patent, no pneumothorax. Pulmonary vascular congestion without pleural effusions or focal  consolidations. Similar bibasilar atelectasis.  SOFT TISSUES AND OSSEOUS STRUCTURES: Visualized soft tissues and included osseous structures are nonsuspicious; T7 hemangioma.  CT ABDOMEN AND PELVIS FINDINGS  Respiratory motion degraded examination.  SOLID ORGANS: The liver, spleen, pancreas and adrenal glands are unremarkable. Tiny small layering gallstones.  GASTROINTESTINAL TRACT: Small to moderate hiatal hernia. The stomach, small and large bowel are normal in course and caliber without inflammatory changes. The appendix is not discretely identified, however there are no inflammatory changes in the right lower quadrant.  KIDNEYS/ URINARY TRACT: Kidneys are orthotopic, demonstrating symmetric enhancement. No nephrolithiasis, hydronephrosis or solid renal masses.  Similar nonspecific perinephric stranding. The unopacified ureters are normal in course and caliber. Delayed imaging through the kidneys demonstrates symmetric prompt contrast excretion within the proximal urinary collecting system. Urinary bladder is partially distended and unremarkable.  PERITONEUM/RETROPERITONEUM: No intraperitoneal free fluid nor free air. Aortoiliac vessels are normal in course and caliber, mild direct calcific atherosclerosis. No lymphadenopathy by CT size criteria. Prostate is enlarged, 6.7 x 4.4 cm  SOFT TISSUE/OSSEOUS STRUCTURES: Nonsuspicious. Abdominis rectus abdominis diastases. Small fat containing umbilical hernia. Ankylosis of the sacroiliac joints. 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  IMPRESSION: CT CHEST: No CT findings of acute thoracic trauma.  Stable cardiomegaly and pulmonary vascular congestion. Bibasilar atelectasis on this mildly respiratory motion degraded examination.  CT ABDOMEN AND PELVIS: No CT findings of acute trauma on this respiratory motion degraded examination. No acute intra-abdominal or pelvic process.  Cholelithiasis without CT findings of acute  cholecystitis.   Electronically Signed   By: Elon Alas   On: 03/14/2014 00:40   Ct Cervical Spine Wo Contrast  03/14/2014   CLINICAL DATA:  Patient fell getting off of an exercise bike, striking his back and head on the couch.  EXAM: CT HEAD WITHOUT CONTRAST  CT CERVICAL SPINE WITHOUT CONTRAST  TECHNIQUE: Multidetector CT imaging of the head and cervical spine was performed following the standard protocol without intravenous contrast. Multiplanar CT image reconstructions of the cervical spine were also generated.  COMPARISON:  CT head 09/02/2013.  MRI brain 09/03/2013.  FINDINGS: CT HEAD FINDINGS  Mild diffuse cerebral atrophy. Low-attenuation changes in the deep white matter consistent with central atrophy. Extensive vascular calcifications. Old encephalomalacia in the left posterior frontal lobe and left occipital lobes consistent with old infarcts. The occipital infarct is new since the previous CT study but was demonstrated in its acute phase on the MRI. No mass effect or midline shift. No abnormal extra-axial fluid collections. Gray-white matter junctions are distinct. Basal cisterns are not effaced. No evidence of acute intracranial hemorrhage. No depressed skull fractures. Visualized paranasal sinuses and mastoid air cells are not opacified.  CT CERVICAL SPINE FINDINGS  The examination is limited due to motion artifact. Straightening of the usual cervical lordosis which may be due to degenerative changes or patient positioning. However, muscle spasm or ligamentous injury can also have this appearance and are not excluded. No anterior subluxation. Normal alignment of facet joints. C1-2 articulation appears intact except for degenerative changes. Degenerative changes throughout the cervical spine with narrowed cervical interspaces and prominent bridging anterior osteophytes throughout. There is a large disc osteophyte complex at C5-6 and a prominent disc osteophyte complex at C6-7 which likely cause  encroachment upon the central canal. No vertebral compression deformities. No prevertebral soft tissue swelling. No focal bone lesion or bone destruction. Soft tissues are unremarkable. Vascular calcifications.  IMPRESSION: No acute intracranial abnormalities. Old local left frontal and occipital infarcts. Chronic atrophy and small vessel ischemic changes.  Extensive degenerative changes in the cervical spine. Nonspecific straightening of the usual cervical lordosis. No displaced fractures identified.   Electronically Signed   By: Lucienne Capers M.D.   On: 03/14/2014 00:37   Ct Abdomen Pelvis W Contrast  03/14/2014   CLINICAL DATA:  Fall today from exercise bike, back and side pain, hit head on couch. No loss of consciousness.  EXAM: CT CHEST, ABDOMEN, AND PELVIS WITH CONTRAST  TECHNIQUE: Multidetector CT imaging of the chest, abdomen and pelvis was performed following the standard protocol during bolus administration of intravenous  contrast.  CONTRAST:  164m OMNIPAQUE IOHEXOL 300 MG/ML  SOLN  COMPARISON:  CT of the chest October 29, 2011 and CT of the abdomen and pelvis October 28, 2011  FINDINGS: CT CHEST FINDINGS  Mild respiratory motion degraded examination.  MEDIASTINUM: The heart is moderately enlarged, coronary artery calcifications. No pericardial fluid collections. Thoracic aorta is normal in course and caliber with mild calcific atherosclerosis. No periaortic fluid collections. No lymphadenopathy by CT size criteria.  LUNGS: Tracheobronchial tree is patent, no pneumothorax. Pulmonary vascular congestion without pleural effusions or focal consolidations. Similar bibasilar atelectasis.  SOFT TISSUES AND OSSEOUS STRUCTURES: Visualized soft tissues and included osseous structures are nonsuspicious; T7 hemangioma.  CT ABDOMEN AND PELVIS FINDINGS  Respiratory motion degraded examination.  SOLID ORGANS: The liver, spleen, pancreas and adrenal glands are unremarkable. Tiny small layering gallstones.   GASTROINTESTINAL TRACT: Small to moderate hiatal hernia. The stomach, small and large bowel are normal in course and caliber without inflammatory changes. The appendix is not discretely identified, however there are no inflammatory changes in the right lower quadrant.  KIDNEYS/ URINARY TRACT: Kidneys are orthotopic, demonstrating symmetric enhancement. No nephrolithiasis, hydronephrosis or solid renal masses. Similar nonspecific perinephric stranding. The unopacified ureters are normal in course and caliber. Delayed imaging through the kidneys demonstrates symmetric prompt contrast excretion within the proximal urinary collecting system. Urinary bladder is partially distended and unremarkable.  PERITONEUM/RETROPERITONEUM: No intraperitoneal free fluid nor free air. Aortoiliac vessels are normal in course and caliber, mild direct calcific atherosclerosis. No lymphadenopathy by CT size criteria. Prostate is enlarged, 6.7 x 4.4 cm  SOFT TISSUE/OSSEOUS STRUCTURES: Nonsuspicious. Abdominis rectus abdominis diastases. Small fat containing umbilical hernia. Ankylosis of the sacroiliac joints. 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  IMPRESSION: CT CHEST: No CT findings of acute thoracic trauma.  Stable cardiomegaly and pulmonary vascular congestion. Bibasilar atelectasis on this mildly respiratory motion degraded examination.  CT ABDOMEN AND PELVIS: No CT findings of acute trauma on this respiratory motion degraded examination. No acute intra-abdominal or pelvic process.  Cholelithiasis without CT findings of acute cholecystitis.   Electronically Signed   By: CElon Alas  On: 03/14/2014 00:40   Dg Chest Portable 1 View  03/13/2014   CLINICAL DATA:  Pain in the upper back after falling off of an exercise bike today.  EXAM: PORTABLE CHEST - 1 VIEW  COMPARISON:  12/26/2011  FINDINGS: Shallow inspiration with linear atelectasis or fibrosis in the lung bases, similar to  prior study. Heart size and pulmonary vascularity are normal for technique. No focal consolidation in the lungs. No blunting of costophrenic angles. No pneumothorax. Tortuous aorta.  IMPRESSION: Shallow inspiration with linear atelectasis or fibrosis in the lung bases.   Electronically Signed   By: WLucienne CapersM.D.   On: 03/13/2014 22:46     EKG Interpretation None      MDM   Final diagnoses:  Fall  Back contusion, unspecified laterality, initial encounter   Fall from exercise bike onto the left side injuring her low back and left ribs. D uncertain if he hit head. No loss of consciousness. No nausea or vomiting. Patient is on apixaban.  neurological exam nonfocal. Chest x-ray negative for rib fracture or pneumothorax. Give a history of anticoagulation will obtain imaging of head as well as chest abdomen pelvis to rule out any internal bleeding given his back pain. No evidence of cord compression or cauda equina.  Imaging negative for acute pathology. Patient is tolerating by mouth  and able toward. Risk of delayed bleeding discussed with patient and wife. Return to ED with worsening pain, headache, behavior change, weakness, numbness or tingling or any other concerns.  BP 166/55 mmHg  Pulse 57  Temp(Src) 97.7 F (36.5 C) (Oral)  Resp 20  Ht _0  (1.727 m)  Wt 215 lb (97.523 kg)  BMI 32.70 kg/m2  SpO2 97%   I personally performed the services described in this documentation, which was scribed in my presence. The recorded information has been reviewed and is accurate.    Ezequiel Essex, MD 03/14/14 [phone]

## 2014-03-13 NOTE — ED Notes (Signed)
[address] off the exercise bike and I am hurting in my back per pt. Also hurting in my ribs.

## 2014-03-14 MED ORDER — HYDROCODONE-ACETAMINOPHEN 5-325 MG PO TABS
1.0000 | ORAL_TABLET | Freq: Once | ORAL | Status: AC
  Administered 2014-03-14: 1 via ORAL
  Filled 2014-03-14: qty 1

## 2014-03-14 MED ORDER — IOHEXOL 300 MG/ML  SOLN
100.0000 mL | Freq: Once | INTRAMUSCULAR | Status: AC | PRN
  Administered 2014-03-14: 100 mL via INTRAVENOUS

## 2014-03-14 MED ORDER — HYDROCODONE-ACETAMINOPHEN 5-325 MG PO TABS: 2.0000 | ORAL_TABLET | ORAL | Status: DC | PRN

## 2014-03-14 NOTE — Discharge Instructions (Signed)
Fall Prevention and Home Safety Your x-rays are negative for any serious injuries. Return to the ED with worsening pain, headache, vomiting, behavior change or any other concerns. Falls cause injuries and can affect all age groups. It is possible to use preventive measures to significantly decrease the likelihood of falls. There are many simple measures which can make your home safer and prevent falls. OUTDOORS  Repair cracks and edges of walkways and driveways.  Remove high doorway thresholds.  Trim shrubbery on the main path into your home.  Have good outside lighting.  Clear walkways of tools, rocks, debris, and clutter.  Check that handrails are not broken and are securely fastened. Both sides of steps should have handrails.  Have leaves, snow, and ice cleared regularly.  Use sand or salt on walkways during winter months.  In the garage, clean up grease or oil spills. BATHROOM  Install night lights.  Install grab bars by the toilet and in the tub and shower.  Use non-skid mats or decals in the tub or shower.  Place a plastic non-slip stool in the shower to sit on, if needed.  Keep floors dry and clean up all water on the floor immediately.  Remove soap buildup in the tub or shower on a regular basis.  Secure bath mats with non-slip, double-sided rug tape.  Remove throw rugs and tripping hazards from the floors. BEDROOMS  Install night lights.  Make sure a bedside light is easy to reach.  Do not use oversized bedding.  Keep a telephone by your bedside.  Have a firm chair with side arms to use for getting dressed.  Remove throw rugs and tripping hazards from the floor. KITCHEN  Keep handles on pots and pans turned toward the center of the stove. Use back burners when possible.  Clean up spills quickly and allow time for drying.  Avoid walking on wet floors.  Avoid hot utensils and knives.  Position shelves so they are not too high or low.  Place  commonly used objects within easy reach.  If necessary, use a sturdy step stool with a grab bar when reaching.  Keep electrical cables out of the way.  Do not use floor polish or wax that makes floors slippery. If you must use wax, use non-skid floor wax.  Remove throw rugs and tripping hazards from the floor. STAIRWAYS  Never leave objects on stairs.  Place handrails on both sides of stairways and use them. Fix any loose handrails. Make sure handrails on both sides of the stairways are as long as the stairs.  Check carpeting to make sure it is firmly attached along stairs. Make repairs to worn or loose carpet promptly.  Avoid placing throw rugs at the top or bottom of stairways, or properly secure the rug with carpet tape to prevent slippage. Get rid of throw rugs, if possible.  Have an electrician put in a light switch at the top and bottom of the stairs. OTHER FALL PREVENTION TIPS  Wear low-heel or rubber-soled shoes that are supportive and fit well. Wear closed toe shoes.  When using a stepladder, make sure it is fully opened and both spreaders are firmly locked. Do not climb a closed stepladder.  Add color or contrast paint or tape to grab bars and handrails in your home. Place contrasting color strips on first and last steps.  Learn and use mobility aids as needed. Install an electrical emergency response system.  Turn on lights to avoid dark areas. Replace light  bulbs that burn out immediately. Get light switches that glow.  Arrange furniture to create clear pathways. Keep furniture in the same place.  Firmly attach carpet with non-skid or double-sided tape.  Eliminate uneven floor surfaces.  Select a carpet pattern that does not visually hide the edge of steps.  Be aware of all pets. OTHER HOME SAFETY TIPS  Set the water temperature for 120 F (48.8 C).  Keep emergency numbers on or near the telephone.  Keep smoke detectors on every level of the home and near  sleeping areas. Document Released: 03/09/2002 Document Revised: 09/18/2011 Document Reviewed: 06/08/2011 [facility] Patient Information 2015 [place], [place]. This information is not intended to replace advice given to you by your health care provider. Make sure you discuss any questions you have with your health care provider.

## 2014-04-26 ENCOUNTER — Ambulatory Visit: Payer: Medicare HMO | Admitting: Internal Medicine

## 2014-04-27 ENCOUNTER — Encounter: Payer: Self-pay | Admitting: Internal Medicine

## 2014-04-27 ENCOUNTER — Ambulatory Visit (INDEPENDENT_AMBULATORY_CARE_PROVIDER_SITE_OTHER): Payer: PPO | Admitting: Internal Medicine

## 2014-04-27 VITALS — BP 150/68 | HR 58 | Ht 68.0 in | Wt 217.4 lb

## 2014-04-27 DIAGNOSIS — E785 Hyperlipidemia, unspecified: Secondary | ICD-10-CM

## 2014-04-27 DIAGNOSIS — I10 Essential (primary) hypertension: Secondary | ICD-10-CM

## 2014-04-27 DIAGNOSIS — E118 Type 2 diabetes mellitus with unspecified complications: Secondary | ICD-10-CM

## 2014-04-27 DIAGNOSIS — E669 Obesity, unspecified: Secondary | ICD-10-CM

## 2014-04-27 DIAGNOSIS — I350 Nonrheumatic aortic (valve) stenosis: Secondary | ICD-10-CM | POA: Insufficient documentation

## 2014-04-27 DIAGNOSIS — I639 Cerebral infarction, unspecified: Secondary | ICD-10-CM

## 2014-04-27 DIAGNOSIS — I480 Paroxysmal atrial fibrillation: Secondary | ICD-10-CM

## 2014-04-27 NOTE — Progress Notes (Signed)
04/27/2014   PCP: Sheela Stack, MD   Chief Complaint  Patient presents with  . 6 month visit    fell off exercise bike and went to ED for eval; occasional ankle and feet edema (comes and goes); trouble sleeping and falling asleep    Primary Cardiologist:Dr. Alma Friendly   HPI:  74 year old married male presents today with his wife for followup post hospitalization. He has a history of 2 prior strokes in 2011 at 2012 and a history of atrial fibrillation. He has mild aortic stenosis on echo prior to this admission. He has sleep apnea on CPAP as well. He did undergo a TEE guided cardioversion in April 2013 by Dr. Debara Pickett. He has maintained sinus rhythm since that time. Patient had been on pradaxa 150 mg twice a day, but his wife believes that he had missed his evening doses for some time.  He is now on Eliquis.  He was admitted 09/02/2013 and discharged 09/06/2011 for CVA.  He was admitted by the hospitalist and seen by neurology. 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  His carotid ultrasound was less than 39% stenosis bilaterally; his echo revealed moderate LVH and normal EF, and mild aortic stenosis.  I reviewed his EKGs from the hospitalization and his telemetry reports and there was no atrial fibrillation.  Mr. Lauderback returns for followup today. He was recently seen by Mickel Baas our nurse practitioner. This is following a hospitalization for another stroke. It turns out that he may not have been taking his anticoagulation as prescribed. He is now clearly taking the Eliquis twice-daily.  Unfortunately suffered some vision problems from the stroke and is scheduled to see a neurologist as well as his ophthalmologist in the near future.  I saw Mr. Errington back in the office today. He's been doing fairly well. Blood sugars to been somewhat labile and this is followed by Dr. Forde Dandy. Recently had an episode where he was exercising on a stationary bicycle and fell off.  This is probably due to loss of balance. He did have some flank pain and did strike his head and was taken to the emergency room and underwent a number scans which showed fortunately no significant bleeding. This is an appropriate decision based on the fact that he's on Eliquis.  No Known Allergies  Current Outpatient Prescriptions  Medication Sig Dispense Refill  . amLODipine (NORVASC) 2.5 MG tablet Take 2.5 mg by mouth daily.    Marland Kitchen apixaban (ELIQUIS) 5 MG TABS tablet Take 1 tablet (5 mg total) by mouth 2 (two) times daily. 180 tablet 3  . benazepril (LOTENSIN) 40 MG tablet Take 40 mg by mouth daily.    . carvedilol (COREG) 12.5 MG tablet Take 2 tablets (25 mg total) by mouth 2 (two) times daily with a meal.    . Choline Fenofibrate (FENOFIBRIC ACID) 135 MG CPDR Take 1 capsule by mouth daily.    Marland Kitchen ezetimibe (ZETIA) 10 MG tablet Take 10 mg by mouth daily.    Marland Kitchen HYDROcodone-acetaminophen (NORCO/VICODIN) 5-325 MG per tablet Take 2 tablets by mouth every 4 (four) hours as needed. 10 tablet 0  . insulin aspart (NOVOLOG) 100 UNIT/ML injection Inject into the skin 3 (three) times daily before meals.    . Insulin Disposable Pump (V-GO 20) KIT 30 units of lipase by Does not apply route daily. 30 Units of Novolog    . LORazepam (ATIVAN PO) Take by mouth as needed.    Marland Kitchen  mirabegron ER (MYRBETRIQ) 50 MG TB24 tablet Take 50 mg by mouth daily.    . simvastatin (ZOCOR) 80 MG tablet Take 80 mg by mouth daily.    . valsartan-hydrochlorothiazide (DIOVAN-HCT) 320-25 MG per tablet Take 1 tablet by mouth daily.     No current facility-administered medications for this visit.    Past Medical History  Diagnosis Date  . Hypertension   . Diabetes mellitus   . 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 Stroke 2010/2013    Right Brain stroke  . Sleep apnea     cpap  . Shortness of breath     exertion  . Depression   . Atrial fibrillation   . GERD (gastroesophageal  reflux disease)   . Obesity   . 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  . Cholelithiasis 10/2011    Per CT. No cholecystitis radiographically.  . Hemorrhoids 2008  . Hyperlipidemia     Past Surgical History  Procedure Laterality Date  . Knee surgery    . Tonsillectomy    . Colonoscopy    . Tee without cardioversion  07/12/2011    Procedure: TRANSESOPHAGEAL ECHOCARDIOGRAM (TEE);  Surgeon: Pixie Casino, MD;  Location: [facility] ENDOSCOPY;  Service: Cardiovascular;  Laterality: N/A;  to be done at 1330  . Cardioversion  07/12/2011    Procedure: CARDIOVERSION;  Surgeon: Pixie Casino, MD;  Location: [facility] ENDOSCOPY;  Service: Cardiovascular;  Laterality: N/A;  . Carotid doppler study  01/31/2012    no significant extracranial carotid artery stenosis  . Doppler echocardiography  01/31/2012    EF 55-60%, moderately calcified annulus of the mitral valve, left atrium demonstrated mild-moderately dilated    MPN:TIRWERX:VQ colds or fevers, no weight changes Skin:no rashes or ulcers HEENT:-loss of peripheral vision CV:see HPI PUL:see HPI GI:no diarrhea constipation or melena, no indigestion GU:no hematuria, no dysuria MS:no joint pain, no claudication Neuro:no syncope, no lightheadedness Endo:+ diabetes with insulin pump, no thyroid disease  Wt Readings from Last 3 Encounters:  04/27/14 217 lb 6.4 oz (98.612 kg)  03/13/14 215 lb (97.523 kg)  11/23/13 218 lb (98.884 kg)    PHYSICAL EXAM BP 150/68 mmHg  Pulse 58  Ht _0  (1.727 m)  Wt 217 lb 6.4 oz (98.612 kg)  BMI 33.06 kg/m2 General:Pleasant affect, NAD Skin:Warm and dry, brisk capillary refill HEENT:normocephalic, sclera clear, mucus membranes moist Neck:supple, no JVD, no bruits  Heart:S1S2 RRR, n1 s1/s2, 3/6 early peaking systolic murmur, no gallop, rub or click Lungs:clear without rales, rhonchi, or wheezes MGQ:QPYP, non tender, + BS, do not palpate liver spleen or masses, insulin pump in  place Ext:no lower ext edema, 2+ pedal pulses, 2+ radial pulses Neuro:alert and oriented, MAE, follows commands, + facial symmetry  EKG: Sinus bradycardia 58, first-degree AV block, nonspecific T wave changes  ASSESSMENT AND PLAN Patient Active Problem List   Diagnosis Date Noted  . CVA (cerebral vascular accident) 09/15/2013  . PAF (paroxysmal atrial fibrillation) 09/15/2013  . Anticoagulation adequate 09/15/2013  . Confusion 09/02/2013  . RMSF [facility] spotted fever) 10/30/2011  . Cellulitis of left leg 10/29/2011  . Anemia 10/28/2011  . Thrombocytopenia 10/28/2011  . PNA (pneumonia) 10/27/2011  . Febrile illness, acute 10/26/2011  . Cough 10/26/2011  . Diarrhea 10/26/2011  . Dehydration 10/26/2011  . Atrial fibrillation 05/15/2011  . DM type 2 (diabetes mellitus, type 2) 05/15/2011  . Hyperlipemia 05/15/2011  . Obesity 05/15/2011  .  Benign hypertension 05/15/2011  . H/O: stroke 05/15/2011   PLAN: 1.  Mr. Eckardt is doing fairly well unfortunately had a stroke affecting his vision Center in the brain. He has problems now with peripheral vision. he recently had a fall off of an exercise bike but suffered no significant trauma.  He is maintaining sinus. He is on a Eliquis now twice daily and is clear about taking that medication. He needs to continue to work on exercise and some weight loss. He is getting excellent diabetic care by Dr. Forde Dandy. It is now been 3 years since his last echocardiogram which showed very mild aortic stenosis. Would like to recheck that to assure this been no progression. I will plan to see him back in 1 year for ongoing followup.  Pixie Casino, MD, [facility] Attending Cardiologist [place]

## 2014-04-27 NOTE — Patient Instructions (Addendum)
Dr Debara Pickett has ordered the following test(s) to be done: 1. Echocardiogram to be done in 1 year prior to office visit with Dr Debara Pickett. Echocardiography is a painless test that uses sound waves to create images of your heart. It provides your doctor with information about the size and shape of your heart and how well your heart's chambers and valves are working. This procedure takes approximately one hour. There are no restrictions for this procedure.  Your physician has not made any changes in your current medications or treatment plan.  Dr Debara Pickett wants you to follow-up in 1 year. You will receive a reminder letter in the mail one months in advance. If you don't receive a letter, please call our office to schedule the follow-up appointment.  Medication samples have been provided to the patient. Drug name: Eliquis 5 mg Qty: 42 tabs LOT: [id number] Exp.Date: 05/2016  Donivan Scull 2:49 PM 04/27/2014

## 2014-04-28 ENCOUNTER — Encounter: Payer: Self-pay | Admitting: Internal Medicine

## 2014-04-30 ENCOUNTER — Telehealth: Payer: Self-pay | Admitting: *Deleted

## 2014-04-30 NOTE — Telephone Encounter (Signed)
Faxed Patient Assistance Form to [employer] for [employer]

## 2014-05-03 ENCOUNTER — Ambulatory Visit (INDEPENDENT_AMBULATORY_CARE_PROVIDER_SITE_OTHER): Payer: PPO | Admitting: Podiatry

## 2014-05-03 ENCOUNTER — Ambulatory Visit: Payer: Medicare HMO | Admitting: Podiatry

## 2014-05-03 ENCOUNTER — Encounter: Payer: Self-pay | Admitting: Podiatry

## 2014-05-03 DIAGNOSIS — B351 Tinea unguium: Secondary | ICD-10-CM

## 2014-05-03 DIAGNOSIS — M79673 Pain in unspecified foot: Secondary | ICD-10-CM

## 2014-05-03 NOTE — Progress Notes (Signed)
Subjective:     Patient ID: [name], male   DOB: May 19, 1940, 74 y.o.   MRN: [id number]  HPI patient presents with nail disease 1-5 both feet that are painful   Review of Systems     Objective:   Physical Exam Neurovascular status unchanged with thick yellow nailbeds that are painful 1-5 both feet    Assessment:     Mycotic nail infection with pain 1-5 both feet    Plan:     Debris painful nailbeds 1-5 both feet with no iatrogenic bleeding noted

## 2014-05-03 NOTE — Patient Instructions (Signed)
Diabetes and Foot Care Diabetes may cause you to have problems because of poor blood supply (circulation) to your feet and legs. This may cause the skin on your feet to become thinner, break easier, and heal more slowly. Your skin may become dry, and the skin may peel and crack. You may also have nerve damage in your legs and feet causing decreased feeling in them. You may not notice minor injuries to your feet that could lead to infections or more serious problems. Taking care of your feet is one of the most important things you can do for yourself.  HOME CARE INSTRUCTIONS  Wear shoes at all times, even in the house. Do not go barefoot. Bare feet are easily injured.  Check your feet daily for blisters, cuts, and redness. If you cannot see the bottom of your feet, use a mirror or ask someone for help.  Wash your feet with warm water (do not use hot water) and mild soap. Then pat your feet and the areas between your toes until they are completely dry. Do not soak your feet as this can dry your skin.  Apply a moisturizing lotion or petroleum jelly (that does not contain alcohol and is unscented) to the skin on your feet and to dry, brittle toenails. Do not apply lotion between your toes.  Trim your toenails straight across. Do not dig under them or around the cuticle. File the edges of your nails with an emery board or nail file.  Do not cut corns or calluses or try to remove them with medicine.  Wear clean socks or stockings every day. Make sure they are not too tight. Do not wear knee-high stockings since they may decrease blood flow to your legs.  Wear shoes that fit properly and have enough cushioning. To break in new shoes, wear them for just a few hours a day. This prevents you from injuring your feet. Always look in your shoes before you put them on to be sure there are no objects inside.  Do not cross your legs. This may decrease the blood flow to your feet.  If you find a minor scrape,  cut, or break in the skin on your feet, keep it and the skin around it clean and dry. These areas may be cleansed with mild soap and water. Do not cleanse the area with peroxide, alcohol, or iodine.  When you remove an adhesive bandage, be sure not to damage the skin around it.  If you have a wound, look at it several times a day to make sure it is healing.  Do not use heating pads or hot water bottles. They may burn your skin. If you have lost feeling in your feet or legs, you may not know it is happening until it is too late.  Make sure your health care provider performs a complete foot exam at least annually or more often if you have foot problems. Report any cuts, sores, or bruises to your health care provider immediately. SEEK MEDICAL CARE IF:   You have an injury that is not healing.  You have cuts or breaks in the skin.  You have an ingrown nail.  You notice redness on your legs or feet.  You feel burning or tingling in your legs or feet.  You have pain or cramps in your legs and feet.  Your legs or feet are numb.  Your feet always feel cold. SEEK IMMEDIATE MEDICAL CARE IF:   There is increasing redness,   swelling, or pain in or around a wound.  There is a red line that goes up your leg.  Pus is coming from a wound.  You develop a fever or as directed by your health care provider.  You notice a bad smell coming from an ulcer or wound. Document Released: 03/16/2000 Document Revised: 11/19/2012 Document Reviewed: 08/26/2012 ExitCare Patient Information 2015 [employer]. This information is not intended to replace advice given to you by your health care provider. Make sure you discuss any questions you have with your health care provider.  

## 2014-07-01 ENCOUNTER — Telehealth: Payer: Self-pay | Admitting: *Deleted

## 2014-07-01 NOTE — Telephone Encounter (Signed)
Faxed order for CPAP supplies to [facility]

## 2014-08-02 ENCOUNTER — Ambulatory Visit: Payer: PPO

## 2014-08-03 ENCOUNTER — Ambulatory Visit (INDEPENDENT_AMBULATORY_CARE_PROVIDER_SITE_OTHER): Payer: PPO

## 2014-08-03 DIAGNOSIS — M79673 Pain in unspecified foot: Secondary | ICD-10-CM | POA: Diagnosis not present

## 2014-08-03 DIAGNOSIS — B351 Tinea unguium: Secondary | ICD-10-CM

## 2014-08-03 NOTE — Progress Notes (Signed)
Subjective:     Patient ID: [name], male   DOB: [birth year], 74 y.o.   MRN: [id number]  HPI patient presents with nail disease 1-5 both feet that are painful   Review of Systems     Objective:   Physical Exam Neurovascular status unchanged with thick yellow nailbeds that are painful 1-5 both feet    Assessment:     Mycotic nail infection with pain 1-5 both feet    Plan:     Debris painful nailbeds 1-5 both feet with no iatrogenic bleeding noted

## 2014-09-30 ENCOUNTER — Telehealth: Payer: Self-pay | Admitting: *Deleted

## 2014-09-30 NOTE — Telephone Encounter (Signed)
Faxed order to [facility] for water chamber for humidifier for CPAP

## 2014-11-02 ENCOUNTER — Encounter: Payer: Self-pay | Admitting: Podiatry

## 2014-11-02 ENCOUNTER — Ambulatory Visit (INDEPENDENT_AMBULATORY_CARE_PROVIDER_SITE_OTHER): Payer: PPO | Admitting: Podiatry

## 2014-11-02 DIAGNOSIS — B351 Tinea unguium: Secondary | ICD-10-CM | POA: Diagnosis not present

## 2014-11-02 DIAGNOSIS — M79673 Pain in unspecified foot: Secondary | ICD-10-CM | POA: Diagnosis not present

## 2014-11-02 NOTE — Progress Notes (Signed)
Patient ID: [name], male   DOB: 10-06-1940, 74 y.o.   MRN: [id number] Complaint:  Visit Type: Patient returns to my office for continued preventative foot care services. Complaint: Patient states" my nails have grown long and thick and become painful to walk and wear shoes" Patient has been diagnosed with DM with no foot complications. The patient presents for preventative foot care services. No changes to ROS  Podiatric Exam: Vascular: dorsalis pedis and posterior tibial pulses are palpable bilateral. Capillary return is immediate. Temperature gradient is WNL. Skin turgor WNL  Sensorium: Normal Semmes Weinstein monofilament test. Normal tactile sensation bilaterally. Nail Exam: Pt has thick disfigured discolored nails with subungual debris noted bilateral entire nail hallux through fifth toenails Ulcer Exam: There is no evidence of ulcer or pre-ulcerative changes or infection. Orthopedic Exam: Muscle tone and strength are WNL. No limitations in general ROM. No crepitus or effusions noted. Foot type and digits show no abnormalities. Bony prominences are unremarkable. Skin: No Porokeratosis. No infection or ulcers  Diagnosis:  Onychomycosis, , Pain in right toe, pain in left toes  Treatment & Plan Procedures and Treatment: Consent by patient was obtained for treatment procedures. The patient understood the discussion of treatment and procedures well. All questions were answered thoroughly reviewed. Debridement of mycotic and hypertrophic toenails, 1 through 5 bilateral and clearing of subungual debris. No ulceration, no infection noted.  Return Visit-Office Procedure: Patient instructed to return to the office for a follow up visit 3 months for continued evaluation and treatment.

## 2015-02-15 ENCOUNTER — Encounter: Payer: Self-pay | Admitting: Podiatry

## 2015-02-15 ENCOUNTER — Ambulatory Visit (INDEPENDENT_AMBULATORY_CARE_PROVIDER_SITE_OTHER): Payer: PPO | Admitting: Podiatry

## 2015-02-15 DIAGNOSIS — B351 Tinea unguium: Secondary | ICD-10-CM | POA: Diagnosis not present

## 2015-02-15 DIAGNOSIS — M79673 Pain in unspecified foot: Secondary | ICD-10-CM | POA: Diagnosis not present

## 2015-02-15 NOTE — Progress Notes (Signed)
Patient ID: [name], male   DOB: Sep 01, 1940, 74 y.o.   MRN: [id number] Complaint:  Visit Type: Patient returns to my office for continued preventative foot care services. Complaint: Patient states" my nails have grown long and thick and become painful to walk and wear shoes" Patient has been diagnosed with DM with no foot complications. The patient presents for preventative foot care services. No changes to ROS  Podiatric Exam: Vascular: dorsalis pedis and posterior tibial pulses are palpable bilateral. Capillary return is immediate. Temperature gradient is WNL. Skin turgor WNL  Sensorium: Normal Semmes Weinstein monofilament test. Normal tactile sensation bilaterally. Nail Exam: Pt has thick disfigured discolored nails with subungual debris noted bilateral entire nail hallux through fifth toenails Ulcer Exam: There is no evidence of ulcer or pre-ulcerative changes or infection. Orthopedic Exam: Muscle tone and strength are WNL. No limitations in general ROM. No crepitus or effusions noted. Foot type and digits show no abnormalities. Bony prominences are unremarkable. Skin: No Porokeratosis. No infection or ulcers  Diagnosis:  Onychomycosis, , Pain in right toe, pain in left toes  Treatment & Plan Procedures and Treatment: Consent by patient was obtained for treatment procedures. The patient understood the discussion of treatment and procedures well. All questions were answered thoroughly reviewed. Debridement of mycotic and hypertrophic toenails, 1 through 5 bilateral and clearing of subungual debris. No ulceration, no infection noted.  Return Visit-Office Procedure: Patient instructed to return to the office for a follow up visit 3 months for continued evaluation and treatment.  Gardiner Barefoot DPM

## 2015-04-15 DIAGNOSIS — E1140 Type 2 diabetes mellitus with diabetic neuropathy, unspecified: Secondary | ICD-10-CM | POA: Diagnosis not present

## 2015-04-15 DIAGNOSIS — Z6835 Body mass index (BMI) 35.0-35.9, adult: Secondary | ICD-10-CM | POA: Diagnosis not present

## 2015-04-27 DIAGNOSIS — I10 Essential (primary) hypertension: Secondary | ICD-10-CM | POA: Diagnosis not present

## 2015-04-27 DIAGNOSIS — E1140 Type 2 diabetes mellitus with diabetic neuropathy, unspecified: Secondary | ICD-10-CM | POA: Diagnosis not present

## 2015-04-27 DIAGNOSIS — Z6835 Body mass index (BMI) 35.0-35.9, adult: Secondary | ICD-10-CM | POA: Diagnosis not present

## 2015-04-28 ENCOUNTER — Ambulatory Visit: Payer: PPO | Admitting: Internal Medicine

## 2015-05-17 DIAGNOSIS — G4733 Obstructive sleep apnea (adult) (pediatric): Secondary | ICD-10-CM | POA: Diagnosis not present

## 2015-05-24 ENCOUNTER — Ambulatory Visit (INDEPENDENT_AMBULATORY_CARE_PROVIDER_SITE_OTHER): Payer: PPO | Admitting: Podiatry

## 2015-05-24 DIAGNOSIS — M79673 Pain in unspecified foot: Secondary | ICD-10-CM | POA: Diagnosis not present

## 2015-05-24 DIAGNOSIS — B351 Tinea unguium: Secondary | ICD-10-CM

## 2015-05-24 NOTE — Progress Notes (Signed)
Patient ID: [name], male   DOB: 04-25-1940, 75 y.o.   MRN: [id number] Complaint:  Visit Type: Patient returns to my office for continued preventative foot care services. Complaint: Patient states" my nails have grown long and thick and become painful to walk and wear shoes" Patient has been diagnosed with DM with no foot complications. The patient presents for preventative foot care services. No changes to ROS  Podiatric Exam: Vascular: dorsalis pedis and posterior tibial pulses are palpable bilateral. Capillary return is immediate. Temperature gradient is WNL. Skin turgor WNL  Sensorium: Normal Semmes Weinstein monofilament test. Normal tactile sensation bilaterally. Nail Exam: Pt has thick disfigured discolored nails with subungual debris noted bilateral entire nail hallux through fifth toenails Ulcer Exam: There is no evidence of ulcer or pre-ulcerative changes or infection. Orthopedic Exam: Muscle tone and strength are WNL. No limitations in general ROM. No crepitus or effusions noted. Foot type and digits show no abnormalities. Bony prominences are unremarkable. Skin: No Porokeratosis. No infection or ulcers  Diagnosis:  Onychomycosis, , Pain in right toe, pain in left toes  Treatment & Plan Procedures and Treatment: Consent by patient was obtained for treatment procedures. The patient understood the discussion of treatment and procedures well. All questions were answered thoroughly reviewed. Debridement of mycotic and hypertrophic toenails, 1 through 5 bilateral and clearing of subungual debris. No ulceration, no infection noted.  Return Visit-Office Procedure: Patient instructed to return to the office for a follow up visit 3 months for continued evaluation and treatment.  Gardiner Barefoot DPM

## 2015-07-04 DIAGNOSIS — Z1389 Encounter for screening for other disorder: Secondary | ICD-10-CM | POA: Diagnosis not present

## 2015-07-04 DIAGNOSIS — F329 Major depressive disorder, single episode, unspecified: Secondary | ICD-10-CM | POA: Diagnosis not present

## 2015-07-04 DIAGNOSIS — I480 Paroxysmal atrial fibrillation: Secondary | ICD-10-CM | POA: Diagnosis not present

## 2015-07-04 DIAGNOSIS — E784 Other hyperlipidemia: Secondary | ICD-10-CM | POA: Diagnosis not present

## 2015-07-04 DIAGNOSIS — Z6835 Body mass index (BMI) 35.0-35.9, adult: Secondary | ICD-10-CM | POA: Diagnosis not present

## 2015-07-04 DIAGNOSIS — R740 Nonspecific elevation of levels of transaminase and lactic acid dehydrogenase [LDH]: Secondary | ICD-10-CM | POA: Diagnosis not present

## 2015-07-04 DIAGNOSIS — I10 Essential (primary) hypertension: Secondary | ICD-10-CM | POA: Diagnosis not present

## 2015-07-04 DIAGNOSIS — E1142 Type 2 diabetes mellitus with diabetic polyneuropathy: Secondary | ICD-10-CM | POA: Diagnosis not present

## 2015-07-04 DIAGNOSIS — G4730 Sleep apnea, unspecified: Secondary | ICD-10-CM | POA: Diagnosis not present

## 2015-07-04 DIAGNOSIS — E1140 Type 2 diabetes mellitus with diabetic neuropathy, unspecified: Secondary | ICD-10-CM | POA: Diagnosis not present

## 2015-07-04 DIAGNOSIS — H5340 Unspecified visual field defects: Secondary | ICD-10-CM | POA: Diagnosis not present

## 2015-07-05 ENCOUNTER — Telehealth: Payer: Self-pay | Admitting: Internal Medicine

## 2015-07-05 NOTE — Telephone Encounter (Signed)
Received records from [facility] for appointment on 07/20/15 with Dr Debara Pickett.  Records given to [facility] (medical records) for Dr Lysbeth Penner schedule on 07/20/15. lp

## 2015-07-18 ENCOUNTER — Telehealth: Payer: Self-pay | Admitting: *Deleted

## 2015-07-18 NOTE — Telephone Encounter (Signed)
No samples available now - note made for appointment 07/20/15

## 2015-07-18 NOTE — Telephone Encounter (Signed)
Per voicemail from patients wife, he has appointment on Wednesday and would like to get some eliquis samples at that visit.

## 2015-07-20 ENCOUNTER — Encounter: Payer: Self-pay | Admitting: Internal Medicine

## 2015-07-20 ENCOUNTER — Ambulatory Visit (INDEPENDENT_AMBULATORY_CARE_PROVIDER_SITE_OTHER): Payer: PPO | Admitting: Internal Medicine

## 2015-07-20 VITALS — BP 138/66 | HR 56 | Ht 68.0 in | Wt 225.0 lb

## 2015-07-20 DIAGNOSIS — I480 Paroxysmal atrial fibrillation: Secondary | ICD-10-CM | POA: Diagnosis not present

## 2015-07-20 DIAGNOSIS — I10 Essential (primary) hypertension: Secondary | ICD-10-CM

## 2015-07-20 DIAGNOSIS — Z8673 Personal history of transient ischemic attack (TIA), and cerebral infarction without residual deficits: Secondary | ICD-10-CM | POA: Diagnosis not present

## 2015-07-20 DIAGNOSIS — I350 Nonrheumatic aortic (valve) stenosis: Secondary | ICD-10-CM
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
## 2015-07-20 NOTE — Patient Instructions (Signed)
Your physician has recommended you make the following change in your medication: STOP benazepril  Your physician has requested that you have an echocardiogram. Echocardiography is a painless test that uses sound waves to create images of your heart. It provides your doctor with information about the size and shape of your heart and how well your heart's chambers and valves are working. This procedure takes approximately one hour. There are no restrictions for this procedure.  Your physician recommends that you schedule a follow-up appointment in: 2 weeks with Erasmo Downer (clinical pharmacist) for a blood pressure check  Your physician wants you to follow-up in: 6 months with Dr. Debara Pickett. You will receive a reminder letter in the mail two months in advance. If you don't receive a letter, please call our office to schedule the follow-up appointment.

## 2015-07-26 DIAGNOSIS — I10 Essential (primary) hypertension: Secondary | ICD-10-CM | POA: Diagnosis not present

## 2015-07-26 DIAGNOSIS — Z6835 Body mass index (BMI) 35.0-35.9, adult: Secondary | ICD-10-CM | POA: Diagnosis not present

## 2015-07-26 DIAGNOSIS — E1159 Type 2 diabetes mellitus with other circulatory complications: Secondary | ICD-10-CM | POA: Diagnosis not present

## 2015-08-04 ENCOUNTER — Other Ambulatory Visit (HOSPITAL_COMMUNITY): Payer: PPO

## 2015-08-11 ENCOUNTER — Encounter: Payer: Self-pay | Admitting: Pharmacist Clinician (PhC)/ Clinical Pharmacy Specialist

## 2015-08-11 ENCOUNTER — Ambulatory Visit (INDEPENDENT_AMBULATORY_CARE_PROVIDER_SITE_OTHER): Payer: PPO | Admitting: Pharmacist Clinician (PhC)/ Clinical Pharmacy Specialist

## 2015-08-11 VITALS — BP 132/64 | HR 56 | Wt 225.2 lb

## 2015-08-11 DIAGNOSIS — I10 Essential (primary) hypertension: Secondary | ICD-10-CM | POA: Diagnosis not present

## 2015-08-11 NOTE — Patient Instructions (Signed)
   Your blood pressure today is 132/64 (goal is < 140/90)  Check your blood pressure at home several times each week and keep record of the readings.  Take your BP meds as follows: continue with all current medications  Bring all of your meds, your BP cuff and your record of home blood pressures to your next appointment.  Exercise as you're able, try to walk approximately 30 minutes per day.  Keep salt intake to a minimum, especially watch canned and prepared boxed foods.  Eat more fresh fruits and vegetables and fewer canned items.  Avoid eating in fast food restaurants.    HOW TO TAKE YOUR BLOOD PRESSURE: . Rest 5 minutes before taking your blood pressure. .  Don't smoke or drink caffeinated beverages for at least 30 minutes before. . Take your blood pressure before (not after) you eat. . Sit comfortably with your back supported and both feet on the floor (don't cross your legs). . Elevate your arm to heart level on a table or a desk. . Use the proper sized cuff. It should fit smoothly and snugly around your bare upper arm. There should be enough room to slip a fingertip under the cuff. The bottom edge of the cuff should be 1 inch above the crease of the elbow. . Ideally, take 3 measurements at one sitting and record the average.

## 2015-08-11 NOTE — Progress Notes (Signed)
08/11/2015 [name] 04/23/1940 [id number]   HPI:  [name] is a 75 y.o. male patient of Dr Debara Pickett, with a PMH below who presents today for hypertension clinic evaluation.  He saw Dr. Debara Pickett last month and it was discovered that he was taking both benazepril and valsartan hctz.  The benazepril was discontinued.  He is here today to see if any further blood pressure titration is necessary.    Cardiac Hx: multiple strokes (x 3) with the last being in 2015, AF, OSA on CPAP  Family Hx: nothing significant - both parents lived into their 37's; he is an only child  Social Hx: no tobacco, drinks rare alcohol, likes 1-2 cans of diet Coke per day  Diet: eats mainly home cooked meals, wife does not add salt when cooking  Exercise: unable to because of instability after last stroke  Home BP readings: 120-136/70-80 - all WNL  Current antihypertensive medications: valsartan hctz 320/25 qd, amlodipine 2.5 mg qd; carvedilol 12.5 mg bid  Intolerances:none  Wt Readings from Last 3 Encounters:  08/11/15 225 lb 3.2 oz (102.15 kg)  07/20/15 225 lb (102.059 kg)  04/27/14 217 lb 6.4 oz (98.612 kg)   BP Readings from Last 3 Encounters:  08/11/15 132/64  07/20/15 138/66  04/27/14 150/68   Pulse Readings from Last 3 Encounters:  08/11/15 56  07/20/15 56  04/27/14 58    Current Outpatient Prescriptions  Medication Sig Dispense Refill  . amLODipine (NORVASC) 2.5 MG tablet Take 2.5 mg by mouth daily.    Marland Kitchen apixaban (ELIQUIS) 5 MG TABS tablet Take 1 tablet (5 mg total) by mouth 2 (two) times daily. 180 tablet 3  . carvedilol (COREG) 12.5 MG tablet Take 2 tablets (25 mg total) by mouth 2 (two) times daily with a meal.    . Choline Fenofibrate (FENOFIBRIC ACID) 135 MG CPDR Take 1 capsule by mouth daily.    Marland Kitchen escitalopram (LEXAPRO) 10 MG tablet Take 10 mg by mouth daily.    Marland Kitchen ezetimibe (ZETIA) 10 MG tablet Take 10 mg by mouth daily. Reported on 07/20/2015    . HYDROcodone-acetaminophen  (NORCO/VICODIN) 5-325 MG per tablet Take 2 tablets by mouth every 4 (four) hours as needed. 10 tablet 0  . insulin aspart (NOVOLOG) 100 UNIT/ML injection Inject into the skin 3 (three) times daily before meals.    . Insulin Disposable Pump (V-GO 30) KIT 30 Units by Does not apply route daily.    Marland Kitchen LORazepam (ATIVAN PO) Take by mouth as needed.    . mirabegron ER (MYRBETRIQ) 50 MG TB24 tablet Take 50 mg by mouth daily.    . simvastatin (ZOCOR) 80 MG tablet Take 80 mg by mouth daily.    . valsartan-hydrochlorothiazide (DIOVAN-HCT) 320-25 MG per tablet Take 1 tablet by mouth daily.     No current facility-administered medications for this visit.    No Known Allergies  Past Medical History  Diagnosis Date  . Hypertension   . Diabetes mellitus   . 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 Stroke [facility]) 2010/2013    Right Brain stroke  . Sleep apnea     cpap  . Shortness of breath     exertion  . Depression   . Atrial fibrillation ([place])   . GERD (gastroesophageal reflux disease)   . Obesity   . 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  . Cholelithiasis 10/2011  Per CT. No cholecystitis radiographically.  . Hemorrhoids 2008  . Hyperlipidemia     Blood pressure 132/64, pulse 56, weight 225 lb 3.2 oz (102.15 kg).    Tommy Medal PharmD CPP [facility]

## 2015-08-11 NOTE — Assessment & Plan Note (Signed)
BP today is still at goal, as are all previous home readings since discontinuing the benazepril.  Patient to continue with current medications.  He and wife know to call office if they have any concerns.

## 2015-08-15 DIAGNOSIS — G4733 Obstructive sleep apnea (adult) (pediatric): Secondary | ICD-10-CM | POA: Diagnosis not present

## 2015-08-17 ENCOUNTER — Ambulatory Visit (HOSPITAL_COMMUNITY)
Admission: RE | Admit: 2015-08-17 | Discharge: 2015-08-17 | Disposition: A | Discharge status: Home / Self Care | Payer: PPO | Source: Ambulatory Visit | Attending: Internal Medicine | Admitting: Internal Medicine

## 2015-08-17 DIAGNOSIS — I77810 Thoracic aortic ectasia: Secondary | ICD-10-CM | POA: Insufficient documentation

## 2015-08-17 DIAGNOSIS — I350 Nonrheumatic aortic (valve) stenosis: Secondary | ICD-10-CM | POA: Insufficient documentation

## 2015-08-25 DIAGNOSIS — G4733 Obstructive sleep apnea (adult) (pediatric): Secondary | ICD-10-CM | POA: Diagnosis not present

## 2015-08-30 ENCOUNTER — Ambulatory Visit (INDEPENDENT_AMBULATORY_CARE_PROVIDER_SITE_OTHER): Payer: PPO | Admitting: Podiatry

## 2015-08-30 ENCOUNTER — Encounter: Payer: Self-pay | Admitting: Podiatry

## 2015-08-30 DIAGNOSIS — M79673 Pain in unspecified foot: Secondary | ICD-10-CM

## 2015-08-30 DIAGNOSIS — B351 Tinea unguium: Secondary | ICD-10-CM | POA: Diagnosis not present

## 2015-08-30 NOTE — Progress Notes (Signed)
Patient ID: [name], male   DOB: [birth year], 75 y.o.   MRN: [id number] Complaint:  Visit Type: Patient returns to my office for continued preventative foot care services. Complaint: Patient states" my nails have grown long and thick and become painful to walk and wear shoes" Patient has been diagnosed with DM with no foot complications. The patient presents for preventative foot care services. No changes to ROS  Podiatric Exam: Vascular: dorsalis pedis and posterior tibial pulses are palpable bilateral. Capillary return is immediate. Temperature gradient is WNL. Skin turgor WNL  Sensorium: Normal Semmes Weinstein monofilament test. Normal tactile sensation bilaterally. Nail Exam: Pt has thick disfigured discolored nails with subungual debris noted bilateral entire nail hallux through fifth toenails Ulcer Exam: There is no evidence of ulcer or pre-ulcerative changes or infection. Orthopedic Exam: Muscle tone and strength are WNL. No limitations in general ROM. No crepitus or effusions noted. Foot type and digits show no abnormalities. Bony prominences are unremarkable. Skin: No Porokeratosis. No infection or ulcers  Diagnosis:  Onychomycosis, , Pain in right toe, pain in left toes  Treatment & Plan Procedures and Treatment: Consent by patient was obtained for treatment procedures. The patient understood the discussion of treatment and procedures well. All questions were answered thoroughly reviewed. Debridement of mycotic and hypertrophic toenails, 1 through 5 bilateral and clearing of subungual debris. No ulceration, no infection noted.  Return Visit-Office Procedure: Patient instructed to return to the office for a follow up visit 3 months for continued evaluation and treatment.  Gardiner Barefoot DPM

## 2015-11-07 DIAGNOSIS — I350 Nonrheumatic aortic (valve) stenosis: Secondary | ICD-10-CM | POA: Diagnosis not present

## 2015-11-07 DIAGNOSIS — E1142 Type 2 diabetes mellitus with diabetic polyneuropathy: Secondary | ICD-10-CM | POA: Diagnosis not present

## 2015-11-07 DIAGNOSIS — Z6835 Body mass index (BMI) 35.0-35.9, adult: Secondary | ICD-10-CM | POA: Diagnosis not present

## 2015-11-07 DIAGNOSIS — E1159 Type 2 diabetes mellitus with other circulatory complications: Secondary | ICD-10-CM | POA: Diagnosis not present

## 2015-11-07 DIAGNOSIS — I480 Paroxysmal atrial fibrillation: Secondary | ICD-10-CM | POA: Diagnosis not present

## 2015-11-07 DIAGNOSIS — E784 Other hyperlipidemia: Secondary | ICD-10-CM | POA: Diagnosis not present

## 2015-11-07 DIAGNOSIS — H5340 Unspecified visual field defects: Secondary | ICD-10-CM | POA: Diagnosis not present

## 2015-11-07 DIAGNOSIS — I638 Other cerebral infarction: Secondary | ICD-10-CM | POA: Diagnosis not present

## 2015-11-07 DIAGNOSIS — M174 Other bilateral secondary osteoarthritis of knee: Secondary | ICD-10-CM | POA: Diagnosis not present

## 2015-11-07 DIAGNOSIS — I10 Essential (primary) hypertension: Secondary | ICD-10-CM | POA: Diagnosis not present

## 2015-11-07 DIAGNOSIS — G4739 Other sleep apnea: Secondary | ICD-10-CM | POA: Diagnosis not present

## 2015-11-25 DIAGNOSIS — D485 Neoplasm of uncertain behavior of skin: Secondary | ICD-10-CM | POA: Diagnosis not present

## 2015-11-25 DIAGNOSIS — D1801 Hemangioma of skin and subcutaneous tissue: Secondary | ICD-10-CM | POA: Diagnosis not present

## 2015-11-25 DIAGNOSIS — L814 Other melanin hyperpigmentation: Secondary | ICD-10-CM | POA: Diagnosis not present

## 2015-11-25 DIAGNOSIS — L821 Other seborrheic keratosis: Secondary | ICD-10-CM | POA: Diagnosis not present

## 2015-11-25 DIAGNOSIS — L820 Inflamed seborrheic keratosis: Secondary | ICD-10-CM | POA: Diagnosis not present

## 2015-11-25 DIAGNOSIS — D863 Sarcoidosis of skin: Secondary | ICD-10-CM | POA: Diagnosis not present

## 2015-11-29 ENCOUNTER — Ambulatory Visit (INDEPENDENT_AMBULATORY_CARE_PROVIDER_SITE_OTHER): Payer: PPO | Admitting: Podiatry

## 2015-11-29 ENCOUNTER — Encounter: Payer: Self-pay | Admitting: Podiatry

## 2015-11-29 VITALS — BP 168/84 | HR 54

## 2015-11-29 DIAGNOSIS — M79673 Pain in unspecified foot: Secondary | ICD-10-CM | POA: Diagnosis not present

## 2015-11-29 DIAGNOSIS — B351 Tinea unguium: Secondary | ICD-10-CM

## 2015-11-29 NOTE — Progress Notes (Signed)
Patient ID: [name], male   DOB: 27-Jan-1941, 75 y.o.   MRN: [id number] Complaint:  Visit Type: Patient returns to my office for continued preventative foot care services. Complaint: Patient states" my nails have grown long and thick and become painful to walk and wear shoes" Patient has been diagnosed with DM with no foot complications. The patient presents for preventative foot care services. No changes to ROS  Podiatric Exam: Vascular: dorsalis pedis and posterior tibial pulses are palpable bilateral. Capillary return is immediate. Temperature gradient is WNL. Skin turgor WNL  Sensorium: Normal Semmes Weinstein monofilament test. Normal tactile sensation bilaterally. Nail Exam: Pt has thick disfigured discolored nails with subungual debris noted bilateral entire nail hallux through fifth toenails Ulcer Exam: There is no evidence of ulcer or pre-ulcerative changes or infection. Orthopedic Exam: Muscle tone and strength are WNL. No limitations in general ROM. No crepitus or effusions noted. Foot type and digits show no abnormalities. Bony prominences are unremarkable. Skin: No Porokeratosis. No infection or ulcers  Diagnosis:  Onychomycosis, , Pain in right toe, pain in left toes  Treatment & Plan Procedures and Treatment: Consent by patient was obtained for treatment procedures. The patient understood the discussion of treatment and procedures well. All questions were answered thoroughly reviewed. Debridement of mycotic and hypertrophic toenails, 1 through 5 bilateral and clearing of subungual debris. No ulceration, no infection noted.  Return Visit-Office Procedure: Patient instructed to return to the office for a follow up visit 3 months for continued evaluation and treatment.  Gardiner Barefoot DPM

## 2015-12-07 DIAGNOSIS — G4733 Obstructive sleep apnea (adult) (pediatric): Secondary | ICD-10-CM | POA: Diagnosis not present

## 2015-12-20 DIAGNOSIS — E1159 Type 2 diabetes mellitus with other circulatory complications: Secondary | ICD-10-CM | POA: Diagnosis not present

## 2015-12-20 DIAGNOSIS — Z6835 Body mass index (BMI) 35.0-35.9, adult: Secondary | ICD-10-CM | POA: Diagnosis not present

## 2015-12-20 DIAGNOSIS — I10 Essential (primary) hypertension: Secondary | ICD-10-CM | POA: Diagnosis not present

## 2016-03-06 DIAGNOSIS — M1711 Unilateral primary osteoarthritis, right knee: Secondary | ICD-10-CM | POA: Diagnosis not present

## 2016-03-06 DIAGNOSIS — I10 Essential (primary) hypertension: Secondary | ICD-10-CM | POA: Diagnosis not present

## 2016-03-06 DIAGNOSIS — M1712 Unilateral primary osteoarthritis, left knee: Secondary | ICD-10-CM | POA: Diagnosis not present

## 2016-03-06 DIAGNOSIS — F329 Major depressive disorder, single episode, unspecified: Secondary | ICD-10-CM | POA: Diagnosis not present

## 2016-03-06 DIAGNOSIS — E1159 Type 2 diabetes mellitus with other circulatory complications: Secondary | ICD-10-CM | POA: Diagnosis not present

## 2016-03-06 DIAGNOSIS — E784 Other hyperlipidemia: Secondary | ICD-10-CM | POA: Diagnosis not present

## 2016-03-06 DIAGNOSIS — I350 Nonrheumatic aortic (valve) stenosis: Secondary | ICD-10-CM | POA: Diagnosis not present

## 2016-03-06 DIAGNOSIS — I480 Paroxysmal atrial fibrillation: Secondary | ICD-10-CM | POA: Diagnosis not present

## 2016-03-06 DIAGNOSIS — I638 Other cerebral infarction: Secondary | ICD-10-CM | POA: Diagnosis not present

## 2016-03-06 DIAGNOSIS — R740 Nonspecific elevation of levels of transaminase and lactic acid dehydrogenase [LDH]: Secondary | ICD-10-CM | POA: Diagnosis not present

## 2016-03-06 DIAGNOSIS — Z6836 Body mass index (BMI) 36.0-36.9, adult: Secondary | ICD-10-CM | POA: Diagnosis not present

## 2016-03-06 DIAGNOSIS — H5340 Unspecified visual field defects: Secondary | ICD-10-CM | POA: Diagnosis not present

## 2016-03-15 ENCOUNTER — Encounter: Payer: Self-pay | Admitting: Podiatry

## 2016-03-20 ENCOUNTER — Ambulatory Visit: Payer: PPO | Admitting: Podiatry

## 2016-03-20 DIAGNOSIS — I10 Essential (primary) hypertension: Secondary | ICD-10-CM | POA: Diagnosis not present

## 2016-03-20 DIAGNOSIS — E1159 Type 2 diabetes mellitus with other circulatory complications: Secondary | ICD-10-CM | POA: Diagnosis not present

## 2016-04-10 DIAGNOSIS — H524 Presbyopia: Secondary | ICD-10-CM | POA: Diagnosis not present

## 2016-04-13 ENCOUNTER — Ambulatory Visit: Payer: PPO | Admitting: Podiatry

## 2016-05-01 ENCOUNTER — Ambulatory Visit (INDEPENDENT_AMBULATORY_CARE_PROVIDER_SITE_OTHER): Payer: PPO | Admitting: Podiatry

## 2016-05-01 ENCOUNTER — Encounter: Payer: Self-pay | Admitting: Podiatry

## 2016-05-01 VITALS — Ht 68.0 in | Wt 225.0 lb

## 2016-05-01 DIAGNOSIS — E119 Type 2 diabetes mellitus without complications: Secondary | ICD-10-CM

## 2016-05-01 DIAGNOSIS — M79676 Pain in unspecified toe(s): Secondary | ICD-10-CM

## 2016-05-01 DIAGNOSIS — B351 Tinea unguium: Secondary | ICD-10-CM | POA: Diagnosis not present

## 2016-05-01 NOTE — Progress Notes (Signed)
Patient ID: [name], male   DOB: [birth year], 76 y.o.   MRN: [id number] Complaint:  Visit Type: Patient returns to my office for continued preventative foot care services. Complaint: Patient states" my nails have grown long and thick and become painful to walk and wear shoes" Patient has been diagnosed with DM with no foot complications. The patient presents for preventative foot care services. No changes to ROS  Podiatric Exam: Vascular: dorsalis pedis and posterior tibial pulses are palpable bilateral. Capillary return is immediate. Temperature gradient is WNL. Skin turgor WNL  Sensorium: Normal Semmes Weinstein monofilament test. Normal tactile sensation bilaterally. Nail Exam: Pt has thick disfigured discolored nails with subungual debris noted bilateral entire nail hallux through fifth toenails Ulcer Exam: There is no evidence of ulcer or pre-ulcerative changes or infection. Orthopedic Exam: Muscle tone and strength are WNL. No limitations in general ROM. No crepitus or effusions noted. Foot type and digits show no abnormalities. Bony prominences are unremarkable. Skin: No Porokeratosis. No infection or ulcers  Diagnosis:  Onychomycosis, , Pain in right toe, pain in left toes  Treatment & Plan Procedures and Treatment: Consent by patient was obtained for treatment procedures. The patient understood the discussion of treatment and procedures well. All questions were answered thoroughly reviewed. Debridement of mycotic and hypertrophic toenails, 1 through 5 bilateral and clearing of subungual debris. No ulceration, no infection noted.  Return Visit-Office Procedure: Patient instructed to return to the office for a follow up visit 3 months for continued evaluation and treatment.  Gardiner Barefoot DPM

## 2016-06-14 DIAGNOSIS — G4733 Obstructive sleep apnea (adult) (pediatric): Secondary | ICD-10-CM | POA: Diagnosis not present

## 2016-07-10 DIAGNOSIS — I350 Nonrheumatic aortic (valve) stenosis: Secondary | ICD-10-CM | POA: Diagnosis not present

## 2016-07-10 DIAGNOSIS — G4730 Sleep apnea, unspecified: Secondary | ICD-10-CM | POA: Diagnosis not present

## 2016-07-10 DIAGNOSIS — Z6836 Body mass index (BMI) 36.0-36.9, adult: Secondary | ICD-10-CM | POA: Diagnosis not present

## 2016-07-10 DIAGNOSIS — E1159 Type 2 diabetes mellitus with other circulatory complications: Secondary | ICD-10-CM | POA: Diagnosis not present

## 2016-07-10 DIAGNOSIS — R740 Nonspecific elevation of levels of transaminase and lactic acid dehydrogenase [LDH]: Secondary | ICD-10-CM | POA: Diagnosis not present

## 2016-07-10 DIAGNOSIS — I10 Essential (primary) hypertension: Secondary | ICD-10-CM | POA: Diagnosis not present

## 2016-07-10 DIAGNOSIS — E1142 Type 2 diabetes mellitus with diabetic polyneuropathy: Secondary | ICD-10-CM | POA: Diagnosis not present

## 2016-07-10 DIAGNOSIS — F329 Major depressive disorder, single episode, unspecified: Secondary | ICD-10-CM | POA: Diagnosis not present

## 2016-07-10 DIAGNOSIS — H5340 Unspecified visual field defects: Secondary | ICD-10-CM | POA: Diagnosis not present

## 2016-07-10 DIAGNOSIS — I480 Paroxysmal atrial fibrillation: Secondary | ICD-10-CM | POA: Diagnosis not present

## 2016-07-10 DIAGNOSIS — E784 Other hyperlipidemia: Secondary | ICD-10-CM | POA: Diagnosis not present

## 2016-07-10 DIAGNOSIS — I639 Cerebral infarction, unspecified: Secondary | ICD-10-CM | POA: Diagnosis not present

## 2016-07-12 ENCOUNTER — Ambulatory Visit: Payer: PPO | Admitting: Student

## 2016-07-31 ENCOUNTER — Ambulatory Visit: Payer: PPO | Admitting: Podiatry

## 2016-08-08 ENCOUNTER — Emergency Department (HOSPITAL_COMMUNITY)
Admission: EM | Admit: 2016-08-08 | Discharge: 2016-08-08 | Disposition: A | Discharge status: Home / Self Care | Payer: PPO | Attending: Emergency Medicine | Admitting: Emergency Medicine

## 2016-08-08 ENCOUNTER — Emergency Department (HOSPITAL_COMMUNITY): Payer: PPO

## 2016-08-08 ENCOUNTER — Encounter (HOSPITAL_COMMUNITY): Payer: Self-pay | Admitting: *Deleted

## 2016-08-08 DIAGNOSIS — E119 Type 2 diabetes mellitus without complications: Secondary | ICD-10-CM | POA: Insufficient documentation

## 2016-08-08 DIAGNOSIS — Y92009 Unspecified place in unspecified non-institutional (private) residence as the place of occurrence of the external cause: Secondary | ICD-10-CM | POA: Insufficient documentation

## 2016-08-08 DIAGNOSIS — Z794 Long term (current) use of insulin: Secondary | ICD-10-CM | POA: Insufficient documentation

## 2016-08-08 DIAGNOSIS — M542 Cervicalgia: Secondary | ICD-10-CM | POA: Diagnosis not present

## 2016-08-08 DIAGNOSIS — W19XXXA Unspecified fall, initial encounter: Secondary | ICD-10-CM

## 2016-08-08 DIAGNOSIS — W1839XA Other fall on same level, initial encounter: Secondary | ICD-10-CM | POA: Insufficient documentation

## 2016-08-08 DIAGNOSIS — S0990XA Unspecified injury of head, initial encounter: Secondary | ICD-10-CM | POA: Diagnosis not present

## 2016-08-08 DIAGNOSIS — M25512 Pain in left shoulder: Secondary | ICD-10-CM | POA: Insufficient documentation

## 2016-08-08 DIAGNOSIS — T148XXA Other injury of unspecified body region, initial encounter: Secondary | ICD-10-CM

## 2016-08-08 DIAGNOSIS — S81012A Laceration without foreign body, left knee, initial encounter: Secondary | ICD-10-CM | POA: Insufficient documentation

## 2016-08-08 DIAGNOSIS — I10 Essential (primary) hypertension: Secondary | ICD-10-CM | POA: Diagnosis not present

## 2016-08-08 DIAGNOSIS — Y999 Unspecified external cause status: Secondary | ICD-10-CM | POA: Insufficient documentation

## 2016-08-08 DIAGNOSIS — Z79899 Other long term (current) drug therapy: Secondary | ICD-10-CM | POA: Diagnosis not present

## 2016-08-08 DIAGNOSIS — S0083XA Contusion of other part of head, initial encounter: Secondary | ICD-10-CM

## 2016-08-08 DIAGNOSIS — S8992XA Unspecified injury of left lower leg, initial encounter: Secondary | ICD-10-CM | POA: Diagnosis not present

## 2016-08-08 DIAGNOSIS — Y939 Activity, unspecified: Secondary | ICD-10-CM | POA: Diagnosis not present

## 2016-08-08 DIAGNOSIS — S80212A Abrasion, left knee, initial encounter: Secondary | ICD-10-CM | POA: Diagnosis not present

## 2016-08-08 DIAGNOSIS — S199XXA Unspecified injury of neck, initial encounter: Secondary | ICD-10-CM | POA: Diagnosis not present

## 2016-08-08 NOTE — ED Triage Notes (Addendum)
Pt comes in for a fall that occurred around 0930 today. Pt's feet got tangled with the dog under him and this caused him to fall, hitting the left side of his face (bruising and swelling is noted) and his left knee. In addition, he has pain to his neck and left shoulder. Pt is on Eliquis. Denies any LOC.

## 2016-08-08 NOTE — Discharge Instructions (Signed)
Workup on the CT scans without any brain or bony injuries. Wound care as we discussed. Follow-up with your doctor as needed. Return for any new or worse symptoms.

## 2016-08-08 NOTE — ED Notes (Signed)
C-collar on and aligned.

## 2016-08-08 NOTE — ED Provider Notes (Signed)
[place] DEPT Provider Note   CSN: [id number] Arrival date & time: 08/08/16  1715     History   Chief Complaint Chief Complaint  Patient presents with  . Fall    HPI [name] is a 76 y.o. male.  Patient status post fall this morning at 9:30. Fell in the house dog pulled him over on the leash. Patient struck his left cheek below his left eye on the [place] table. Patient is on blood thinners for stroke. No loss of consciousness. Patient without complaint of neck pain vision is normal. Just has a lot of swelling to the left cheek and below the left eye and also has a pretty significant Mefferd eye. Patient also has a superficial skin tear to the left knee. Patient has some baseline mild low back pain but nothing new or worse. No neck pain. No chest pain no shortness of breath no abdominal pain. No extremity concerns other than the skin tear to the left knee.      Past Medical History:  Diagnosis Date  . Atrial fibrillation ([place])   . Cholelithiasis 10/2011   Per CT. No cholecystitis radiographically.  . 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 Depression   . Diabetes mellitus   . GERD (gastroesophageal reflux disease)   . Hemorrhoids 2008  . Hyperlipidemia   . Hypertension   . Obesity   . 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  . Shortness of breath    exertion  . Sleep apnea    cpap  . Stroke [facility]) 2010/2013   Right Brain stroke    Patient Active Problem List   Diagnosis Date Noted  . Aortic stenosis 04/27/2014  . CVA (cerebral vascular accident) ([place]) 09/15/2013  . PAF (paroxysmal atrial fibrillation) ([place]) 09/15/2013  . Anticoagulation adequate 09/15/2013  . Confusion 09/02/2013  . RMS[facility] spotted fever) 10/30/2011  . Cellulitis of left leg 10/29/2011  . Anemia 10/28/2011  . Thrombocytopenia ([place]) 10/28/2011  . PNA (pneumonia) 10/27/2011  . Febrile illness, acute 10/26/2011    . Cough 10/26/2011  . Diarrhea 10/26/2011  . Dehydration 10/26/2011  . Atrial fibrillation ([place]) 05/15/2011  . DM type 2 (diabetes mellitus, type 2) ([place]) 05/15/2011  . Hyperlipemia 05/15/2011  . Obesity 05/15/2011  . Benign hypertension 05/15/2011  . H/O: stroke 05/15/2011    Past Surgical History:  Procedure Laterality Date  . CARDIOVERSION  07/12/2011   Procedure: CARDIOVERSION;  Surgeon: Pixie Casino, MD;  Location: [facility] ENDOSCOPY;  Service: Cardiovascular;  Laterality: N/A;  . CAROTID DOPPLER STUDY  01/31/2012   no significant extracranial carotid artery stenosis  . COLONOSCOPY    . DOPPLER ECHOCARDIOGRAPHY  01/31/2012   EF 55-60%, moderately calcified annulus of the mitral valve, left atrium demonstrated mild-moderately dilated  . KNEE SURGERY    . TEE WITHOUT CARDIOVERSION  07/12/2011   Procedure: TRANSESOPHAGEAL ECHOCARDIOGRAM (TEE);  Surgeon: Pixie Casino, MD;  Location[facility] ENDOSCOPY;  Service: Cardiovascular;  Laterality: N/A;  to be done at 1330  . TONSILLECTOMY         Home Medications    Prior to Admission medications   Medication Sig Start Date End Date Taking? Authorizing Provider  amLODipine (NORVASC) 2.5 MG tablet Take 2.5 mg by mouth daily.   Yes [provider]  apixaban (ELIQUIS) 5 MG TABS tablet Take 1 tablet (5 mg total) by mouth 2 (two) times daily. 10/30/13  Yes Lyman Bishop  C, MD  carvedilol (COREG) 12.5 MG tablet Take 2 tablets (25 mg total) by mouth 2 (two) times daily with a meal. 05/16/11  Yes Delfina Redwood, MD  Choline Fenofibrate (FENOFIBRIC ACID) 135 MG CPDR Take 1 capsule by mouth daily.   Yes [provider]  escitalopram (LEXAPRO) 10 MG tablet Take 10 mg by mouth daily.   Yes [provider]  ezetimibe (ZETIA) 10 MG tablet Take 10 mg by mouth daily. Reported on 07/20/2015   Yes [provider]  glucose 4 GM chewable tablet Chew 1 tablet by mouth as needed for low blood sugar.   Yes [provider]  Insulin Disposable Pump (V-GO 30) KIT by Pump Prime route daily. Humalog used   Yes [provider]  insulin lispro (HUMALOG) 100 UNIT/ML injection Inject into the skin daily. Pump used   Yes [provider]  mirabegron ER (MYRBETRIQ) 50 MG TB24 tablet Take 50 mg by mouth daily.   Yes [provider]  simvastatin (ZOCOR) 80 MG tablet Take 80 mg by mouth daily.   Yes [provider]  valsartan-hydrochlorothiazide (DIOVAN-HCT) 320-25 MG per tablet Take 1 tablet by mouth daily.   Yes [provider]    Family History Family History  Problem Relation Age of Onset  . Diabetes Mother   . Diabetes Son   . Pancreatic cancer      GRANDFATHER  . Colon cancer      INTESTINAL, GRANDMOTHER    Social History Social History  Substance Use Topics  . Smoking status: Never Smoker  . Smokeless tobacco: Never Used  . Alcohol use 1.2 oz/week    2 Glasses of wine per week     Comment: occasionally      Allergies   Patient has no known allergies.   Review of Systems Review of Systems  Constitutional: Negative for fever.  HENT: Negative for congestion.   Eyes: Negative for visual disturbance.  Respiratory: Negative for shortness of breath.   Cardiovascular: Negative for chest pain.  Gastrointestinal: Negative for abdominal pain.  Genitourinary: Negative for hematuria.  Musculoskeletal: Positive for back pain. Negative for neck pain.  Skin: Positive for wound.  Neurological: Negative for syncope and headaches.  Hematological: Does not bruise/bleed easily.  Psychiatric/Behavioral: Negative for confusion.     Physical Exam Updated Vital Signs BP 123/77 (BP Location: Left Arm)   Pulse 81   Temp 97.3 F (36.3 C) (Temporal)   Resp 17   Ht 5' 8"  (1.727 m)   Wt 102.1 kg   SpO2 98%   BMI 34.21 kg/m   Physical Exam  Constitutional: He is oriented to person, place, and time. He appears well-developed and well-nourished. No  distress.  HENT:  Head: Normocephalic.  Significant hematoma to left cheek area measuring probably about 5 x 5 cm. Also with deep bruising to that area. Left eyeball itself appears normal sclerae clear I tracks normal  Eyes: Conjunctivae and EOM are normal. Pupils are equal, round, and reactive to light.  Left eyeball appears normal no hyphema. Vision is equal in both eyes. Marked swelling to the lower lid. No evidence of any direct eyeball trauma.  Neck: Normal range of motion.  Cardiovascular: Normal rate, regular rhythm and normal heart sounds.   Pulmonary/Chest: Effort normal and breath sounds normal. No respiratory distress.  Abdominal: Soft. Bowel sounds are normal. There is no tenderness.  Musculoskeletal: Normal range of motion. He exhibits no edema.  Superficial skin tear to the  left knee bandage already in place. No effusion no swelling good range of motion of the knee.  Neurological: He is alert and oriented to person, place, and time. No cranial nerve deficit or sensory deficit. He exhibits normal muscle tone. Coordination normal.  Skin: Skin is warm.  Nursing note and vitals reviewed.    ED Treatments / Results  Labs (all labs ordered are listed, but only abnormal results are displayed) Labs Reviewed - No data to display  EKG  EKG Interpretation None       Radiology Ct Head Wo Contrast  Result Date: 08/08/2016 CLINICAL DATA:  Patient fell earlier today, hitting head and face. Patient is anticoagulated. EXAM: CT HEAD WITHOUT CONTRAST CT MAXILLOFACIAL WITHOUT CONTRAST CT CERVICAL SPINE WITHOUT CONTRAST TECHNIQUE: Multidetector CT imaging of the head, cervical spine, and maxillofacial structures were performed using the standard protocol without intravenous contrast. Multiplanar CT image reconstructions of the cervical spine and maxillofacial structures were also generated. COMPARISON:  CT head 03/14/2014. FINDINGS: CT HEAD FINDINGS Brain: No evidence for acute infarction,  hemorrhage, mass lesion, hydrocephalus, or extra-axial fluid. There is moderately advanced atrophy. There are extensive areas of chronic cerebral infarction affecting the LEFT greater than RIGHT cerebral hemispheres. Extensive hypoattenuation of white matter is noted representing small vessel disease. Vascular: There is advanced vascular calcification and dolichoectasia in the anterior and posterior circulation. No signs of large vessel occlusion. Skull: Normal. Negative for fracture or focal lesion. Other: None. CT MAXILLOFACIAL FINDINGS Osseous: No facial fracture or mandibular dislocation. No destructive process. Orbits: Preseptal periorbital soft tissue swelling is fairly severe over the LEFT orbit. There is no apparent injury to the globe or postseptal hematoma. Sinuses: No layering sinus fluid or blowout injury. Soft tissues: There is a large hematoma over the LEFT face. CT CERVICAL SPINE FINDINGS Alignment: Mild straightening of the normal cervical lordosis. No subluxation. Skull base and vertebrae: No cervical spine fracture. 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 Soft tissues and spinal canal: No visible intraspinal hematoma. There is advanced runner atherosclerosis. Airway is midline. Disc levels: There is severe stenosis due to osseous spurring at C5-C6 on the RIGHT. Similar less severe changes at C6-7, worse on the LEFT. Uncinate spurring contributes to foraminal narrowing at multiple levels. There is advanced facet arthropathy at C2-3 on the LEFT. Upper chest: No pneumothorax. Other: None. IMPRESSION: Extensive soft tissue hematoma over the LEFT face. No facial fracture or blowout injury. LEFT preseptal periorbital hematoma without visible injury to the globe or postseptal hemorrhage. No cervical spine fracture or traumatic subluxation. Advanced spondylosis. No skull fracture or intracranial hemorrhage. Atrophy with chronic ischemic change, similar to priors. Electronically  Signed   By: Staci Righter M.D.   On: 08/08/2016 19:00   Ct Cervical Spine Wo Contrast  Result Date: 08/08/2016 CLINICAL DATA:  Patient fell earlier today, hitting head and face. Patient is anticoagulated. EXAM: CT HEAD WITHOUT CONTRAST CT MAXILLOFACIAL WITHOUT CONTRAST CT CERVICAL SPINE WITHOUT CONTRAST TECHNIQUE: Multidetector CT imaging of the head, cervical spine, and maxillofacial structures were performed using the standard protocol without intravenous contrast. Multiplanar CT image reconstructions of the cervical spine and maxillofacial structures were also generated. COMPARISON:  CT head 03/14/2014. FINDINGS: CT HEAD FINDINGS Brain: No evidence for acute infarction, hemorrhage, mass lesion, hydrocephalus, or extra-axial fluid. There is moderately advanced atrophy. There are extensive areas of chronic cerebral infarction affecting the LEFT greater than RIGHT cerebral hemispheres. Extensive hypoattenuation of white matter is noted representing small vessel disease. Vascular:  There is advanced vascular calcification and dolichoectasia in the anterior and posterior circulation. No signs of large vessel occlusion. Skull: Normal. Negative for fracture or focal lesion. Other: None. CT MAXILLOFACIAL FINDINGS Osseous: No facial fracture or mandibular dislocation. No destructive process. Orbits: Preseptal periorbital soft tissue swelling is fairly severe over the LEFT orbit. There is no apparent injury to the globe or postseptal hematoma. Sinuses: No layering sinus fluid or blowout injury. Soft tissues: There is a large hematoma over the LEFT face. CT CERVICAL SPINE FINDINGS Alignment: Mild straightening of the normal cervical lordosis. No subluxation. Skull base and vertebrae: No cervical spine fracture. 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 Soft tissues and spinal canal: No visible intraspinal hematoma. There is advanced runner atherosclerosis. Airway is midline. Disc levels: There  is severe stenosis due to osseous spurring at C5-C6 on the RIGHT. Similar less severe changes at C6-7, worse on the LEFT. Uncinate spurring contributes to foraminal narrowing at multiple levels. There is advanced facet arthropathy at C2-3 on the LEFT. Upper chest: No pneumothorax. Other: None. IMPRESSION: Extensive soft tissue hematoma over the LEFT face. No facial fracture or blowout injury. LEFT preseptal periorbital hematoma without visible injury to the globe or postseptal hemorrhage. No cervical spine fracture or traumatic subluxation. Advanced spondylosis. No skull fracture or intracranial hemorrhage. Atrophy with chronic ischemic change, similar to priors. Electronically Signed   By: Staci Righter M.D.   On: 08/08/2016 19:00   Ct Maxillofacial Wo Contrast  Result Date: 08/08/2016 CLINICAL DATA:  Patient fell earlier today, hitting head and face. Patient is anticoagulated. EXAM: CT HEAD WITHOUT CONTRAST CT MAXILLOFACIAL WITHOUT CONTRAST CT CERVICAL SPINE WITHOUT CONTRAST TECHNIQUE: Multidetector CT imaging of the head, cervical spine, and maxillofacial structures were performed using the standard protocol without intravenous contrast. Multiplanar CT image reconstructions of the cervical spine and maxillofacial structures were also generated. COMPARISON:  CT head 03/14/2014. FINDINGS: CT HEAD FINDINGS Brain: No evidence for acute infarction, hemorrhage, mass lesion, hydrocephalus, or extra-axial fluid. There is moderately advanced atrophy. There are extensive areas of chronic cerebral infarction affecting the LEFT greater than RIGHT cerebral hemispheres. Extensive hypoattenuation of white matter is noted representing small vessel disease. Vascular: There is advanced vascular calcification and dolichoectasia in the anterior and posterior circulation. No signs of large vessel occlusion. Skull: Normal. Negative for fracture or focal lesion. Other: None. CT MAXILLOFACIAL FINDINGS Osseous: No facial fracture or  mandibular dislocation. No destructive process. Orbits: Preseptal periorbital soft tissue swelling is fairly severe over the LEFT orbit. There is no apparent injury to the globe or postseptal hematoma. Sinuses: No layering sinus fluid or blowout injury. Soft tissues: There is a large hematoma over the LEFT face. CT CERVICAL SPINE FINDINGS Alignment: Mild straightening of the normal cervical lordosis. No subluxation. Skull base and vertebrae: No cervical spine fracture. 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 Soft tissues and spinal canal: No visible intraspinal hematoma. There is advanced runner atherosclerosis. Airway is midline. Disc levels: There is severe stenosis due to osseous spurring at C5-C6 on the RIGHT. Similar less severe changes at C6-7, worse on the LEFT. Uncinate spurring contributes to foraminal narrowing at multiple levels. There is advanced facet arthropathy at C2-3 on the LEFT. Upper chest: No pneumothorax. Other: None. IMPRESSION: Extensive soft tissue hematoma over the LEFT face. No facial fracture or blowout injury. LEFT preseptal periorbital hematoma without visible injury to the globe or postseptal hemorrhage. No cervical spine fracture or traumatic subluxation. Advanced spondylosis.  No skull fracture or intracranial hemorrhage. Atrophy with chronic ischemic change, similar to priors. Electronically Signed   By: Staci Righter M.D.   On: 08/08/2016 19:00    Procedures Procedures (including critical care time)  Medications Ordered in ED Medications - No data to display   Initial Impression / Assessment and Plan / ED Course  I have reviewed the triage vital signs and the nursing notes.  Pertinent labs & imaging results that were available during my care of the patient were reviewed by me and considered in my medical decision making (see chart for details).     Patient status post a fall at home inside dog pulled him over when he was trying to take a  look on the leash. Patient's left cheek area was struck by a corner of a table. Has a large bruise and hematoma there. No visual changes.   CT head face and neck without any acute abnormalities. Patient is on the blood thinner Eliqis.  Patient does have a superficial skin tear to his left knee but no complaints of any significant pain there.  Patient without any other complaints.  Patient stable for discharge home wound care instructions provided. Tetanus is up-to-date.  Final Clinical Impressions(s) / ED Diagnoses   Final diagnoses:  Fall, initial encounter  Contusion of face, initial encounter  Abrasion    New Prescriptions New Prescriptions   No medications on file     Fredia Sorrow, MD 08/08/16 2031

## 2016-08-08 NOTE — ED Notes (Signed)
Family at bedside. 

## 2016-08-21 DIAGNOSIS — Z9181 History of falling: Secondary | ICD-10-CM | POA: Diagnosis not present

## 2016-08-21 DIAGNOSIS — Z6836 Body mass index (BMI) 36.0-36.9, adult: Secondary | ICD-10-CM | POA: Diagnosis not present

## 2016-08-21 DIAGNOSIS — E1159 Type 2 diabetes mellitus with other circulatory complications: Secondary | ICD-10-CM | POA: Diagnosis not present

## 2016-08-21 DIAGNOSIS — I10 Essential (primary) hypertension: Secondary | ICD-10-CM | POA: Diagnosis not present

## 2016-11-13 DIAGNOSIS — Z6835 Body mass index (BMI) 35.0-35.9, adult: Secondary | ICD-10-CM | POA: Diagnosis not present

## 2016-11-13 DIAGNOSIS — E1142 Type 2 diabetes mellitus with diabetic polyneuropathy: Secondary | ICD-10-CM | POA: Diagnosis not present

## 2016-11-13 DIAGNOSIS — E1159 Type 2 diabetes mellitus with other circulatory complications: Secondary | ICD-10-CM | POA: Diagnosis not present

## 2016-11-13 DIAGNOSIS — I639 Cerebral infarction, unspecified: Secondary | ICD-10-CM | POA: Diagnosis not present

## 2016-11-13 DIAGNOSIS — R740 Nonspecific elevation of levels of transaminase and lactic acid dehydrogenase [LDH]: Secondary | ICD-10-CM | POA: Diagnosis not present

## 2016-11-13 DIAGNOSIS — I350 Nonrheumatic aortic (valve) stenosis: Secondary | ICD-10-CM | POA: Diagnosis not present

## 2016-11-13 DIAGNOSIS — I480 Paroxysmal atrial fibrillation: Secondary | ICD-10-CM | POA: Diagnosis not present

## 2016-11-13 DIAGNOSIS — G4730 Sleep apnea, unspecified: Secondary | ICD-10-CM | POA: Diagnosis not present

## 2016-11-13 DIAGNOSIS — I779 Disorder of arteries and arterioles, unspecified: Secondary | ICD-10-CM | POA: Diagnosis not present

## 2016-11-13 DIAGNOSIS — I10 Essential (primary) hypertension: Secondary | ICD-10-CM | POA: Diagnosis not present

## 2016-11-13 DIAGNOSIS — Z1389 Encounter for screening for other disorder: Secondary | ICD-10-CM | POA: Diagnosis not present

## 2017-01-08 DIAGNOSIS — G4733 Obstructive sleep apnea (adult) (pediatric): Secondary | ICD-10-CM | POA: Diagnosis not present

## 2017-01-09 DIAGNOSIS — I10 Essential (primary) hypertension: Secondary | ICD-10-CM | POA: Diagnosis not present

## 2017-01-09 DIAGNOSIS — Z6835 Body mass index (BMI) 35.0-35.9, adult: Secondary | ICD-10-CM | POA: Diagnosis not present

## 2017-01-09 DIAGNOSIS — E1159 Type 2 diabetes mellitus with other circulatory complications: Secondary | ICD-10-CM | POA: Diagnosis not present

## 2017-01-09 DIAGNOSIS — Z794 Long term (current) use of insulin: Secondary | ICD-10-CM | POA: Diagnosis not present

## 2017-03-21 DIAGNOSIS — F329 Major depressive disorder, single episode, unspecified: Secondary | ICD-10-CM | POA: Diagnosis not present

## 2017-03-21 DIAGNOSIS — I480 Paroxysmal atrial fibrillation: Secondary | ICD-10-CM | POA: Diagnosis not present

## 2017-03-21 DIAGNOSIS — I350 Nonrheumatic aortic (valve) stenosis: Secondary | ICD-10-CM | POA: Diagnosis not present

## 2017-03-21 DIAGNOSIS — M179 Osteoarthritis of knee, unspecified: Secondary | ICD-10-CM | POA: Diagnosis not present

## 2017-03-21 DIAGNOSIS — G4730 Sleep apnea, unspecified: Secondary | ICD-10-CM | POA: Diagnosis not present

## 2017-03-21 DIAGNOSIS — E1159 Type 2 diabetes mellitus with other circulatory complications: Secondary | ICD-10-CM | POA: Diagnosis not present

## 2017-03-21 DIAGNOSIS — R740 Nonspecific elevation of levels of transaminase and lactic acid dehydrogenase [LDH]: Secondary | ICD-10-CM | POA: Diagnosis not present

## 2017-03-21 DIAGNOSIS — I639 Cerebral infarction, unspecified: Secondary | ICD-10-CM | POA: Diagnosis not present

## 2017-03-21 DIAGNOSIS — E1140 Type 2 diabetes mellitus with diabetic neuropathy, unspecified: Secondary | ICD-10-CM | POA: Diagnosis not present

## 2017-03-21 DIAGNOSIS — I779 Disorder of arteries and arterioles, unspecified: Secondary | ICD-10-CM | POA: Diagnosis not present

## 2017-03-21 DIAGNOSIS — E1142 Type 2 diabetes mellitus with diabetic polyneuropathy: Secondary | ICD-10-CM | POA: Diagnosis not present

## 2017-03-21 DIAGNOSIS — R1319 Other dysphagia: Secondary | ICD-10-CM | POA: Diagnosis not present

## 2017-03-21 DIAGNOSIS — E7849 Other hyperlipidemia: Secondary | ICD-10-CM | POA: Diagnosis not present

## 2017-03-21 DIAGNOSIS — H5340 Unspecified visual field defects: Secondary | ICD-10-CM | POA: Diagnosis not present

## 2017-03-21 DIAGNOSIS — I10 Essential (primary) hypertension: Secondary | ICD-10-CM | POA: Diagnosis not present

## 2017-03-21 DIAGNOSIS — Z1389 Encounter for screening for other disorder: Secondary | ICD-10-CM | POA: Diagnosis not present

## 2017-03-21 DIAGNOSIS — Z6835 Body mass index (BMI) 35.0-35.9, adult: Secondary | ICD-10-CM | POA: Diagnosis not present

## 2017-04-17 DIAGNOSIS — G4733 Obstructive sleep apnea (adult) (pediatric): Secondary | ICD-10-CM | POA: Diagnosis not present

## 2017-04-24 DIAGNOSIS — E1159 Type 2 diabetes mellitus with other circulatory complications: Secondary | ICD-10-CM | POA: Diagnosis not present

## 2017-04-24 DIAGNOSIS — Z794 Long term (current) use of insulin: Secondary | ICD-10-CM | POA: Diagnosis not present

## 2017-04-24 DIAGNOSIS — I10 Essential (primary) hypertension: Secondary | ICD-10-CM | POA: Diagnosis not present

## 2017-04-24 DIAGNOSIS — I480 Paroxysmal atrial fibrillation: Secondary | ICD-10-CM | POA: Diagnosis not present

## 2017-04-24 DIAGNOSIS — Z6835 Body mass index (BMI) 35.0-35.9, adult: Secondary | ICD-10-CM | POA: Diagnosis not present

## 2017-07-25 DIAGNOSIS — L988 Other specified disorders of the skin and subcutaneous tissue: Secondary | ICD-10-CM | POA: Diagnosis not present

## 2017-07-25 DIAGNOSIS — Z9181 History of falling: Secondary | ICD-10-CM | POA: Diagnosis not present

## 2017-07-25 DIAGNOSIS — E1159 Type 2 diabetes mellitus with other circulatory complications: Secondary | ICD-10-CM | POA: Diagnosis not present

## 2017-07-25 DIAGNOSIS — I10 Essential (primary) hypertension: Secondary | ICD-10-CM | POA: Diagnosis not present

## 2017-07-25 DIAGNOSIS — Z794 Long term (current) use of insulin: Secondary | ICD-10-CM | POA: Diagnosis not present

## 2017-07-26 ENCOUNTER — Ambulatory Visit: Payer: PPO | Admitting: Podiatry

## 2017-07-26 ENCOUNTER — Encounter: Payer: Self-pay | Admitting: Podiatry

## 2017-07-26 DIAGNOSIS — D689 Coagulation defect, unspecified: Secondary | ICD-10-CM

## 2017-07-26 DIAGNOSIS — B351 Tinea unguium: Secondary | ICD-10-CM

## 2017-07-26 DIAGNOSIS — M79676 Pain in unspecified toe(s): Secondary | ICD-10-CM | POA: Diagnosis not present

## 2017-07-26 DIAGNOSIS — E119 Type 2 diabetes mellitus without complications: Secondary | ICD-10-CM | POA: Diagnosis not present

## 2017-07-26 NOTE — Progress Notes (Signed)
Patient ID: [name], male   DOB: May 15, 1940, 77 y.o.   MRN: [id number] Complaint:  Visit Type: Patient returns to my office for continued preventative foot care services. Complaint: Patient states" my nails have grown long and thick and become painful to walk and wear shoes" Patient has been diagnosed with DM with no foot complications. The patient presents for preventative foot care services. No changes to ROS.  Patient is taking eliquiss.  Podiatric Exam: Vascular: dorsalis pedis and posterior tibial pulses are palpable bilateral. Capillary return is immediate. Temperature gradient is WNL. Skin turgor WNL  Sensorium: Normal Semmes Weinstein monofilament test. Normal tactile sensation bilaterally. Nail Exam: Pt has thick disfigured discolored nails with subungual debris noted bilateral entire nail hallux through fifth toenails Ulcer Exam: There is no evidence of ulcer or pre-ulcerative changes or infection. Orthopedic Exam: Muscle tone and strength are WNL. No limitations in general ROM. No crepitus or effusions noted. Foot type and digits show no abnormalities. Bony prominences are unremarkable. Skin: No Porokeratosis. No infection or ulcers  Diagnosis:  Onychomycosis, , Pain in right toe, pain in left toes  Treatment & Plan Procedures and Treatment: Consent by patient was obtained for treatment procedures. The patient understood the discussion of treatment and procedures well. All questions were answered thoroughly reviewed. Debridement of mycotic and hypertrophic toenails, 1 through 5 bilateral and clearing of subungual debris. No ulceration, no infection noted.  Return Visit-Office Procedure: Patient instructed to return to the office for a follow up visit 3 months for continued evaluation and treatment.  Gardiner Barefoot DPM

## 2017-07-31 DIAGNOSIS — Z6835 Body mass index (BMI) 35.0-35.9, adult: Secondary | ICD-10-CM | POA: Diagnosis not present

## 2017-07-31 DIAGNOSIS — I10 Essential (primary) hypertension: Secondary | ICD-10-CM | POA: Diagnosis not present

## 2017-07-31 DIAGNOSIS — I480 Paroxysmal atrial fibrillation: Secondary | ICD-10-CM | POA: Diagnosis not present

## 2017-07-31 DIAGNOSIS — Z1389 Encounter for screening for other disorder: Secondary | ICD-10-CM | POA: Diagnosis not present

## 2017-07-31 DIAGNOSIS — E1159 Type 2 diabetes mellitus with other circulatory complications: Secondary | ICD-10-CM | POA: Diagnosis not present

## 2017-07-31 DIAGNOSIS — H5340 Unspecified visual field defects: Secondary | ICD-10-CM | POA: Diagnosis not present

## 2017-07-31 DIAGNOSIS — I779 Disorder of arteries and arterioles, unspecified: Secondary | ICD-10-CM | POA: Diagnosis not present

## 2017-07-31 DIAGNOSIS — I350 Nonrheumatic aortic (valve) stenosis: Secondary | ICD-10-CM | POA: Diagnosis not present

## 2017-07-31 DIAGNOSIS — E7849 Other hyperlipidemia: Secondary | ICD-10-CM | POA: Diagnosis not present

## 2017-07-31 DIAGNOSIS — F3289 Other specified depressive episodes: Secondary | ICD-10-CM | POA: Diagnosis not present

## 2017-07-31 DIAGNOSIS — E1142 Type 2 diabetes mellitus with diabetic polyneuropathy: Secondary | ICD-10-CM | POA: Diagnosis not present

## 2017-07-31 DIAGNOSIS — I6990 Unspecified sequelae of unspecified cerebrovascular disease: Secondary | ICD-10-CM | POA: Diagnosis not present

## 2017-08-17 ENCOUNTER — Other Ambulatory Visit: Payer: Self-pay

## 2017-08-17 ENCOUNTER — Inpatient Hospital Stay (HOSPITAL_COMMUNITY)
Admission: EM | Admit: 2017-08-17 | Discharge: 2017-08-18 | DRG: 872 | Disposition: A | Discharge status: Home / Self Care | Payer: PPO | Attending: Internal Medicine | Admitting: Internal Medicine

## 2017-08-17 ENCOUNTER — Emergency Department (HOSPITAL_COMMUNITY): Payer: PPO

## 2017-08-17 ENCOUNTER — Encounter (HOSPITAL_COMMUNITY): Payer: Self-pay | Admitting: Emergency Medicine

## 2017-08-17 DIAGNOSIS — A4189 Other specified sepsis: Principal | ICD-10-CM | POA: Diagnosis present

## 2017-08-17 DIAGNOSIS — I5032 Chronic diastolic (congestive) heart failure: Secondary | ICD-10-CM | POA: Diagnosis not present

## 2017-08-17 DIAGNOSIS — Z6839 Body mass index (BMI) 39.0-39.9, adult: Secondary | ICD-10-CM | POA: Diagnosis not present

## 2017-08-17 DIAGNOSIS — E118 Type 2 diabetes mellitus with unspecified complications: Secondary | ICD-10-CM

## 2017-08-17 DIAGNOSIS — E119 Type 2 diabetes mellitus without complications: Secondary | ICD-10-CM | POA: Diagnosis not present

## 2017-08-17 DIAGNOSIS — B349 Viral infection, unspecified: Secondary | ICD-10-CM | POA: Diagnosis present

## 2017-08-17 DIAGNOSIS — R6510 Systemic inflammatory response syndrome (SIRS) of non-infectious origin without acute organ dysfunction: Secondary | ICD-10-CM | POA: Diagnosis not present

## 2017-08-17 DIAGNOSIS — Z7901 Long term (current) use of anticoagulants: Secondary | ICD-10-CM | POA: Diagnosis not present

## 2017-08-17 DIAGNOSIS — F329 Major depressive disorder, single episode, unspecified: Secondary | ICD-10-CM | POA: Diagnosis not present

## 2017-08-17 DIAGNOSIS — I480 Paroxysmal atrial fibrillation: Secondary | ICD-10-CM | POA: Diagnosis present

## 2017-08-17 DIAGNOSIS — G4730 Sleep apnea, unspecified: Secondary | ICD-10-CM | POA: Diagnosis not present

## 2017-08-17 DIAGNOSIS — Z8673 Personal history of transient ischemic attack (TIA), and cerebral infarction without residual deficits: Secondary | ICD-10-CM | POA: Diagnosis not present

## 2017-08-17 DIAGNOSIS — E669 Obesity, unspecified: Secondary | ICD-10-CM | POA: Diagnosis not present

## 2017-08-17 DIAGNOSIS — Z833 Family history of diabetes mellitus: Secondary | ICD-10-CM

## 2017-08-17 DIAGNOSIS — E785 Hyperlipidemia, unspecified: Secondary | ICD-10-CM | POA: Diagnosis not present

## 2017-08-17 DIAGNOSIS — I10 Essential (primary) hypertension: Secondary | ICD-10-CM | POA: Diagnosis present

## 2017-08-17 DIAGNOSIS — I69322 Dysarthria following cerebral infarction: Secondary | ICD-10-CM

## 2017-08-17 DIAGNOSIS — Z79899 Other long term (current) drug therapy: Secondary | ICD-10-CM

## 2017-08-17 DIAGNOSIS — I110 Hypertensive heart disease with heart failure: Secondary | ICD-10-CM | POA: Diagnosis not present

## 2017-08-17 DIAGNOSIS — R05 Cough: Secondary | ICD-10-CM | POA: Diagnosis not present

## 2017-08-17 DIAGNOSIS — Z794 Long term (current) use of insulin: Secondary | ICD-10-CM | POA: Diagnosis not present

## 2017-08-17 DIAGNOSIS — Z9641 Presence of insulin pump (external) (internal): Secondary | ICD-10-CM | POA: Diagnosis present

## 2017-08-17 DIAGNOSIS — R531 Weakness: Secondary | ICD-10-CM | POA: Diagnosis not present

## 2017-08-17 LAB — CBG MONITORING, ED
Glucose-Capillary: 104 mg/dL — ABNORMAL HIGH (ref 65–99)
Glucose-Capillary: 92 mg/dL (ref 65–99)

## 2017-08-17 LAB — URINALYSIS, ROUTINE W REFLEX MICROSCOPIC
Bacteria, UA: NONE SEEN
Bilirubin Urine: NEGATIVE
Glucose, UA: 50 mg/dL — AB
Ketones, ur: NEGATIVE mg/dL
Leukocytes, UA: NEGATIVE
Nitrite: NEGATIVE
Protein, ur: 30 mg/dL — AB
Specific Gravity, Urine: 1.02 (ref 1.005–1.030)
pH: 6 (ref 5.0–8.0)

## 2017-08-17 LAB — CBC WITH DIFFERENTIAL/PLATELET
Basophils Absolute: 0 10*3/uL (ref 0.0–0.1)
Basophils Relative: 0 %
Eosinophils Absolute: 0 10*3/uL (ref 0.0–0.7)
Eosinophils Relative: 0 %
HCT: 44.4 % (ref 39.0–52.0)
Hemoglobin: 14.7 g/dL (ref 13.0–17.0)
Lymphocytes Relative: 7 %
Lymphs Abs: 0.6 10*3/uL — ABNORMAL LOW (ref 0.7–4.0)
MCH: 30.5 pg (ref 26.0–34.0)
MCHC: 33.1 g/dL (ref 30.0–36.0)
MCV: 92.1 fL (ref 78.0–100.0)
Monocytes Absolute: 0.9 10*3/uL (ref 0.1–1.0)
Monocytes Relative: 10 %
Neutro Abs: 7.6 10*3/uL (ref 1.7–7.7)
Neutrophils Relative %: 83 %
Platelets: 151 10*3/uL (ref 150–400)
RBC: 4.82 MIL/uL (ref 4.22–5.81)
RDW: 12.7 % (ref 11.5–15.5)
WBC: 9.1 10*3/uL (ref 4.0–10.5)

## 2017-08-17 LAB — COMPREHENSIVE METABOLIC PANEL
ALT: 26 U/L (ref 17–63)
AST: 31 U/L (ref 15–41)
Albumin: 3.9 g/dL (ref 3.5–5.0)
Alkaline Phosphatase: 46 U/L (ref 38–126)
Anion gap: 8 (ref 5–15)
BUN: 18 mg/dL (ref 6–20)
CO2: 26 mmol/L (ref 22–32)
Calcium: 9.1 mg/dL (ref 8.9–10.3)
Chloride: 103 mmol/L (ref 101–111)
Creatinine, Ser: 1.13 mg/dL (ref 0.61–1.24)
GFR calc Af Amer: >60 mL/min (ref >60)
GFR calc non Af Amer: >60 mL/min (ref >60)
Glucose, Bld: 104 mg/dL — ABNORMAL HIGH (ref 65–99)
Potassium: 3.5 mmol/L (ref 3.5–5.1)
Sodium: 137 mmol/L (ref 135–145)
Total Bilirubin: 1.3 mg/dL — ABNORMAL HIGH (ref 0.3–1.2)
Total Protein: 6.7 g/dL (ref 6.5–8.1)

## 2017-08-17 LAB — I-STAT CG4 LACTIC ACID, ED: Lactic Acid, Venous: 1.51 mmol/L (ref 0.5–1.9)

## 2017-08-17 MED ORDER — ACETAMINOPHEN 650 MG RE SUPP: 650.0000 mg | Freq: Four times a day (QID) | RECTAL | Status: DC | PRN

## 2017-08-17 MED ORDER — ACETAMINOPHEN 500 MG PO TABS
1000.0000 mg | ORAL_TABLET | Freq: Once | ORAL | Status: AC
  Administered 2017-08-17: 1000 mg via ORAL
  Filled 2017-08-17: qty 2

## 2017-08-17 MED ORDER — ONDANSETRON HCL 4 MG PO TABS: 4.0000 mg | ORAL_TABLET | Freq: Four times a day (QID) | ORAL | Status: DC | PRN

## 2017-08-17 MED ORDER — LOSARTAN POTASSIUM 50 MG PO TABS
100.0000 mg | ORAL_TABLET | Freq: Every day | ORAL | Status: DC
  Administered 2017-08-18: 100 mg via ORAL
  Filled 2017-08-17: qty 2

## 2017-08-17 MED ORDER — CARVEDILOL 12.5 MG PO TABS
25.0000 mg | ORAL_TABLET | Freq: Two times a day (BID) | ORAL | Status: DC
  Administered 2017-08-18: 25 mg via ORAL
  Filled 2017-08-17: qty 2

## 2017-08-17 MED ORDER — INSULIN PUMP
Freq: Three times a day (TID) | SUBCUTANEOUS | Status: DC
  Administered 2017-08-18: 12:00:00 via SUBCUTANEOUS
  Filled 2017-08-17: qty 1

## 2017-08-17 MED ORDER — EZETIMIBE 10 MG PO TABS
10.0000 mg | ORAL_TABLET | Freq: Every day | ORAL | Status: DC
  Administered 2017-08-18: 10 mg via ORAL
  Filled 2017-08-17: qty 1

## 2017-08-17 MED ORDER — SODIUM CHLORIDE 0.9 % IV BOLUS
500.0000 mL | Freq: Once | INTRAVENOUS | Status: AC
  Administered 2017-08-18: 500 mL via INTRAVENOUS

## 2017-08-17 MED ORDER — PIPERACILLIN-TAZOBACTAM 3.375 G IVPB 30 MIN
3.3750 g | Freq: Once | INTRAVENOUS | Status: AC
  Administered 2017-08-17: 3.375 g via INTRAVENOUS
  Filled 2017-08-17: qty 50

## 2017-08-17 MED ORDER — SODIUM CHLORIDE 0.9% FLUSH
3.0000 mL | Freq: Two times a day (BID) | INTRAVENOUS | Status: DC
  Administered 2017-08-18 (×2): 3 mL via INTRAVENOUS

## 2017-08-17 MED ORDER — SODIUM CHLORIDE 0.9% FLUSH: 3.0000 mL | INTRAVENOUS | Status: DC | PRN

## 2017-08-17 MED ORDER — ESCITALOPRAM OXALATE 10 MG PO TABS
10.0000 mg | ORAL_TABLET | Freq: Every day | ORAL | Status: DC
  Administered 2017-08-18: 10 mg via ORAL
  Filled 2017-08-17 (×2): qty 1

## 2017-08-17 MED ORDER — SODIUM CHLORIDE 0.9 % IV SOLN: 250.0000 mL | INTRAVENOUS | Status: DC | PRN

## 2017-08-17 MED ORDER — MIRABEGRON ER 25 MG PO TB24
50.0000 mg | ORAL_TABLET | Freq: Every day | ORAL | Status: DC
  Administered 2017-08-18: 50 mg via ORAL
  Filled 2017-08-17: qty 1
  Filled 2017-08-17: qty 2
  Filled 2017-08-17: qty 1

## 2017-08-17 MED ORDER — APIXABAN 5 MG PO TABS
5.0000 mg | ORAL_TABLET | Freq: Two times a day (BID) | ORAL | Status: DC
  Administered 2017-08-18: 5 mg via ORAL
  Filled 2017-08-17: qty 1

## 2017-08-17 MED ORDER — FENOFIBRATE 54 MG PO TABS
108.0000 mg | ORAL_TABLET | Freq: Every day | ORAL | Status: DC
  Filled 2017-08-17 (×2): qty 2

## 2017-08-17 MED ORDER — ONDANSETRON HCL 4 MG/2ML IJ SOLN: 4.0000 mg | Freq: Four times a day (QID) | INTRAMUSCULAR | Status: DC | PRN

## 2017-08-17 MED ORDER — ACETAMINOPHEN 325 MG PO TABS: 650.0000 mg | ORAL_TABLET | Freq: Four times a day (QID) | ORAL | Status: DC | PRN

## 2017-08-17 MED ORDER — VANCOMYCIN HCL IN DEXTROSE 1-5 GM/200ML-% IV SOLN
1000.0000 mg | Freq: Once | INTRAVENOUS | Status: AC
  Administered 2017-08-17: 1000 mg via INTRAVENOUS
  Filled 2017-08-17: qty 200

## 2017-08-17 MED ORDER — HYDROCODONE-ACETAMINOPHEN 5-325 MG PO TABS
1.0000 | ORAL_TABLET | ORAL | Status: DC | PRN
Start: 2017-08-17 — End: 2017-08-18

## 2017-08-17 MED ORDER — ATORVASTATIN CALCIUM 40 MG PO TABS: 40.0000 mg | ORAL_TABLET | Freq: Every day | ORAL | Status: DC

## 2017-08-17 MED ORDER — SENNOSIDES-DOCUSATE SODIUM 8.6-50 MG PO TABS: 1.0000 | ORAL_TABLET | Freq: Every evening | ORAL | Status: DC | PRN

## 2017-08-17 MED ORDER — AMLODIPINE BESYLATE 5 MG PO TABS
2.5000 mg | ORAL_TABLET | Freq: Every day | ORAL | Status: DC
  Administered 2017-08-18: 2.5 mg via ORAL
  Filled 2017-08-17: qty 1

## 2017-08-17 MED ORDER — SODIUM CHLORIDE 0.9% FLUSH
3.0000 mL | Freq: Two times a day (BID) | INTRAVENOUS | Status: DC
  Administered 2017-08-18: 3 mL via INTRAVENOUS

## 2017-08-17 NOTE — Progress Notes (Signed)
Pharmacy Note:  Initial antibiotic(s) regimen of vancomycin and zosyn ordered by EDP to treat sepsis.  CrCl cannot be calculated (Patient's most recent lab result is older than the maximum 21 days allowed.).   No Known Allergies  Vitals:   08/17/17 2059 08/17/17 2100  BP:  110/86  Pulse: (!) 104 (!) 107  Resp: (!) 30 (!) 31  Temp:    SpO2: 93% 92%    Anti-infectives (From admission, onward)   Start     Dose/Rate Route Frequency Ordered Stop   08/17/17 2100  piperacillin-tazobactam (ZOSYN) IVPB 3.375 g     3.375 g 100 mL/hr over 30 Minutes Intravenous  Once 08/17/17 2059     08/17/17 2100  vancomycin (VANCOCIN) IVPB 1000 mg/200 mL premix     1,000 mg 200 mL/hr over 60 Minutes Intravenous  Once 08/17/17 2059       Plan: Initial dose(s) of vancomycin 1gm and zosyn 3.375gm X 1 ordered. F/U admission orders for further dosing if therapy continued.  Ena Dawley, [place] 08/17/2017 9:22 PM

## 2017-08-17 NOTE — ED Notes (Signed)
Please contact pt's son : Gracelyn Nurse at [phone] with any updates.

## 2017-08-17 NOTE — ED Triage Notes (Signed)
Per family member, patient has been "really slow in his movements" for 1 week and weakness starting today.

## 2017-08-17 NOTE — ED Notes (Signed)
RA sats 89-91%, applied O2 via [place] at 2 L/M

## 2017-08-17 NOTE — ED Notes (Signed)
EKG done and seen by Dr Winfred Leeds

## 2017-08-17 NOTE — ED Provider Notes (Signed)
[facility] EMERGENCY DEPARTMENT Provider Note   CSN: [id number] Arrival date & time: 08/17/17  2019     History   Chief Complaint Chief Complaint  Patient presents with  . Weakness    HPI [name] is a 77 y.o. male.level V caveat acuity of situation. Complains of generalized weaknesssince today and tremors. He denies pain anywhere. He does admit to cough, and unchanged which is chronic since her strokeno other associated symptoms  HPI  Past Medical History:  Diagnosis Date  . Atrial fibrillation ([place])   . Cholelithiasis 10/2011   Per CT. No cholecystitis radiographically.  . 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 Depression   . Diabetes mellitus   . GERD (gastroesophageal reflux disease)   . Hemorrhoids 2008  . Hyperlipidemia   . Hypertension   . Obesity   . 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  . Shortness of breath    exertion  . Sleep apnea    cpap  . Stroke [facility]) 2010/2013   Right Brain stroke    Patient Active Problem List   Diagnosis Date Noted  . Aortic stenosis 04/27/2014  . CVA (cerebral vascular accident) ([place]) 09/15/2013  . PAF (paroxysmal atrial fibrillation) ([place]) 09/15/2013  . Anticoagulation adequate 09/15/2013  . Confusion 09/02/2013  . RMSF [facility] spotted fever) 10/30/2011  . Cellulitis of left leg 10/29/2011  . Anemia 10/28/2011  . Thrombocytopenia ([place]) 10/28/2011  . PNA (pneumonia) 10/27/2011  . Febrile illness, acute 10/26/2011  . Cough 10/26/2011  . Diarrhea 10/26/2011  . Dehydration 10/26/2011  . Atrial fibrillation ([place]) 05/15/2011  . DM type 2 (diabetes mellitus, type 2) ([place]) 05/15/2011  . Hyperlipemia 05/15/2011  . Obesity 05/15/2011  . Benign hypertension 05/15/2011  . H/O: stroke 05/15/2011    Past Surgical History:  Procedure Laterality Date  . CARDIOVERSION  07/12/2011   Procedure: CARDIOVERSION;  Surgeon: Pixie Casino, MD;   Location: [facility] ENDOSCOPY;  Service: Cardiovascular;  Laterality: N/A;  . CAROTID DOPPLER STUDY  01/31/2012   no significant extracranial carotid artery stenosis  . COLONOSCOPY    . DOPPLER ECHOCARDIOGRAPHY  01/31/2012   EF 55-60%, moderately calcified annulus of the mitral valve, left atrium demonstrated mild-moderately dilated  . KNEE SURGERY    . TEE WITHOUT CARDIOVERSION  07/12/2011   Procedure: TRANSESOPHAGEAL ECHOCARDIOGRAM (TEE);  Surgeon: Pixie Casino, MD;  Location: [facility] ENDOSCOPY;  Service: Cardiovascular;  Laterality: N/A;  to be done at 1330  . TONSILLECTOMY          Home Medications    Prior to Admission medications   Medication Sig Start Date End Date Taking? Authorizing Provider  amLODipine (NORVASC) 2.5 MG tablet Take 2.5 mg by mouth daily.   Yes [provider]  apixaban (ELIQUIS) 5 MG TABS tablet Take 1 tablet (5 mg total) by mouth 2 (two) times daily. 10/30/13  Yes Hilty, Nadean Corwin, MD  carvedilol (COREG) 12.5 MG tablet Take 2 tablets (25 mg total) by mouth 2 (two) times daily with a meal. 05/16/11  Yes Delfina Redwood, MD  Choline Fenofibrate (FENOFIBRIC ACID) 135 MG CPDR Take 1 capsule by mouth daily.   Yes [provider]  escitalopram (LEXAPRO) 10 MG tablet Take 10 mg by mouth daily.   Yes [provider]  ezetimibe (ZETIA) 10 MG tablet Take 10 mg by mouth daily. Reported on 07/20/2015   Yes [provider]  Insulin Disposable Pump (  V-GO 30) KIT by Pump Prime route daily. Humalog used   Yes [provider]  insulin lispro (HUMALOG) 100 UNIT/ML injection Inject into the skin daily. Pump used   Yes [provider]  losartan-hydrochlorothiazide (HYZAAR) 100-25 MG tablet Take 1 tablet by mouth daily. 07/24/17  Yes [provider]  mirabegron ER (MYRBETRIQ) 50 MG TB24 tablet Take 50 mg by mouth daily.   Yes [provider]  simvastatin (ZOCOR) 80 MG tablet Take 80 mg by mouth daily.   Yes [provider]  glucose 4 GM chewable tablet Chew 1 tablet by mouth as needed for low blood sugar.    [provider]    Family History Family History  Problem Relation Age of Onset  . Diabetes Mother   . Diabetes Son   . Pancreatic cancer Unknown        GRANDFATHER  . Colon cancer Unknown        INTESTINAL, GRANDMOTHER    Social History Social History   Tobacco Use  . Smoking status: Never Smoker  . Smokeless tobacco: Never Used  Substance Use Topics  . Alcohol use: Yes    Alcohol/week: 1.2 oz    Types: 2 Glasses of wine per week    Comment: occasionally   . Drug use: No     Allergies   Patient has no known allergies.   Review of Systems Review of Systems  Respiratory: Positive for cough.        Chronic cough  Allergic/Immunologic: Positive for immunocompromised state.       Diabetic  Neurological: Positive for tremors and weakness.       Generalized weakness weakness  All other systems reviewed and are negative.    Physical Exam Updated Vital Signs BP 110/86   Pulse (!) 107   Temp (!) 103.9 F (39.9 C) (Rectal)   Resp (!) 31   Wt 117.9 kg (260 lb)   SpO2 92%   BMI 39.53 kg/m   Physical Exam  Constitutional:  Moderately ill-appearing and alert Glasgow Coma Score 15  HENT:  Head: Normocephalic and atraumatic.  No facial asymmetry mucous membranes dry  Eyes: Pupils are equal, round, and reactive to light. Conjunctivae are normal.  Neck: Neck supple. No tracheal deviation present. No thyromegaly present.  Cardiovascular: Normal rate.  No murmur heard. Irregularly irregular  Pulmonary/Chest: Effort normal and breath sounds normal.  Abdominal: Soft. Bowel sounds are normal. He exhibits no distension. There is no tenderness.  Genitourinary: Penis normal.  Genitourinary Comments: Normal male genitalia. Perineum normal  Musculoskeletal: Normal range of motion. He exhibits no edema or tenderness.  4 extremity is without redness or tenderness  neurovascularly intact  Neurological: He is alert. No cranial nerve deficit. Coordination normal.  Skin: Skin is warm and dry. Capillary refill takes less than 2 seconds. No rash noted.  Psychiatric: He has a normal mood and affect.  Nursing note and vitals reviewed.    ED Treatments / Results  Labs (all labs ordered are listed, but only abnormal results are displayed) Labs Reviewed  CBG MONITORING, ED - Abnormal; Notable for the following components:      Result Value   Glucose-Capillary 104 (*)    All other components within normal limits  CULTURE, BLOOD (ROUTINE X 2)  CULTURE, BLOOD (ROUTINE X 2)  COMPREHENSIVE METABOLIC PANEL  CBC WITH DIFFERENTIAL/PLATELET  URINALYSIS, ROUTINE W REFLEX MICROSCOPIC  I-STAT CG4 LACTIC ACID, ED    EKG None  Radiology No results  found.  Procedures Procedures (including critical care time)  Medications Ordered in ED Medications  piperacillin-tazobactam (ZOSYN) IVPB 3.375 g (has no administration in time range)  vancomycin (VANCOCIN) IVPB 1000 mg/200 mL premix (has no administration in time range)  acetaminophen (TYLENOL) tablet 1,000 mg (has no administration in time range)     Initial Impression / Assessment and Plan / ED Course  I have reviewed the triage vital signs and the nursing notes.  Pertinent labs & imaging results that were available during my care of the patient were reviewed by me and considered in my medical decision making (see chart for details).     Code sepsis called based on Sirs criteria of pulse, respirations, temperature source of infection unclear 10:35 PM patient states "I feel much better. I think my fever iss breaking".after treatment with intravenousantibiotics and Tylenol administered orally.  Sepsis - Repeat Assessment  Performed at:    1035pm  Vitals     Blood pressure 117/61, pulse 92, temperature (!) 102.3 F (39.1 C), temperature source Oral, resp. rate (!) 23, weight 117.9 kg (260 lb), SpO2 93  %.  Heart:     Irregular rate and rhythm  Lungs:    CTA  Capillary Refill:   <2 sec  Peripheral Pulse:   Radial pulse palpable  Skin:     Normal Color  Chest x-ray viewed by me Results for orders placed or performed during the hospital encounter of 08/17/17  Blood Culture (routine x 2)  Result Value Ref Range   Specimen Description BLOOD RIGHT HAND    Special Requests      BOTTLES DRAWN AEROBIC AND ANAEROBIC Blood Culture adequate volume DRAWN BY RN Performed at [facility], [address] [place], [place] [address]    Culture PENDING    Report Status PENDING   Blood Culture (routine x 2)  Result Value Ref Range   Specimen Description BLOOD RIGHT HAND    Special Requests      BOTTLES DRAWN AEROBIC AND ANAEROBIC Blood Culture adequate volume Performed at [facility], [address], [place], [place] [address]    Culture PENDING    Report Status PENDING   Comprehensive metabolic panel  Result Value Ref Range   Sodium 137 135 - 145 mmol/L   Potassium 3.5 3.5 - 5.1 mmol/L   Chloride 103 101 - 111 mmol/L   CO2 26 22 - 32 mmol/L   Glucose, Bld 104 (H) 65 - 99 mg/dL   BUN 18 6 - 20 mg/dL   Creatinine, Ser 1.13 0.61 - 1.24 mg/dL   Calcium 9.1 8.9 - 10.3 mg/dL   Total Protein 6.7 6.5 - 8.1 g/dL   Albumin 3.9 3.5 - 5.0 g/dL   AST 31 15 - 41 U/L   ALT 26 17 - 63 U/L   Alkaline Phosphatase 46 38 - 126 U/L   Total Bilirubin 1.3 (H) 0.3 - 1.2 mg/dL   GFR calc non Af Amer >60 >60 mL/min   GFR calc Af Amer >60 >60 mL/min   Anion gap 8 5 - 15  CBC WITH DIFFERENTIAL  Result Value Ref Range   WBC 9.1 4.0 - 10.5 K/uL   RBC 4.82 4.22 - 5.81 MIL/uL   Hemoglobin 14.7 13.0 - 17.0 g/dL   HCT 44.4 39.0 - 52.0 %   MCV 92.1 78.0 - 100.0 fL   MCH 30.5 26.0 - 34.0 pg   MCHC 33.1 30.0 - 36.0 g/dL   RDW 12.7 11.5 - 15.5 %  Platelets 151 150 - 400 K/uL   Neutrophils Relative % 83 %   Neutro Abs 7.6 1.7 - 7.7 K/uL   Lymphocytes Relative 7 %   Lymphs Abs 0.6 (L) 0.7 - 4.0 K/uL    Monocytes Relative 10 %   Monocytes Absolute 0.9 0.1 - 1.0 K/uL   Eosinophils Relative 0 %   Eosinophils Absolute 0.0 0.0 - 0.7 K/uL   Basophils Relative 0 %   Basophils Absolute 0.0 0.0 - 0.1 K/uL  CBG monitoring, ED  Result Value Ref Range   Glucose-Capillary 104 (H) 65 - 99 mg/dL  I-Stat CG4 Lactic Acid, ED  (not at  [facility])  Result Value Ref Range   Lactic Acid, Venous 1.51 0.5 - 1.9 mmol/L   Dg Chest Port 1 View  Result Date: 08/17/2017 CLINICAL DATA:  Sepsis and chronic cough. EXAM: PORTABLE CHEST 1 VIEW COMPARISON:  03/13/2014 and prior exams FINDINGS: Cardiomegaly and mild pulmonary vascular congestion noted. Mild bibasilar atelectasis/scarring again noted. There is no evidence of focal airspace disease, pulmonary edema, suspicious pulmonary nodule/mass, pleural effusion, or pneumothorax. No acute bony abnormalities are identified. IMPRESSION: Cardiomegaly with mild pulmonary vascular congestion. Mild bibasilar atelectasis/scarring again noted. Electronically Signed   By: Margarette Canada M.D.   On: 08/17/2017 21:43  in light of immunocompromise state generalized weakness will consult hospitalistDr. Opyd to arrange for overnight stay.he will see patient in the emergency department Final Clinical Impressions(s) / ED Diagnoses  Diagnosis systemic inflammatory response syndrome Final diagnoses:  None    ED Discharge Orders    None       Orlie Dakin, MD 08/17/17 2317

## 2017-08-17 NOTE — H&P (Signed)
History and Physical    [name] KGM:[id number] DOB: Sep 04, 1940 DOA: 08/17/2017  PCP: Reynold Bowen, MD   Patient coming from: Home  Chief Complaint: Malaise, fatigue, gen weakness, chills  HPI: [name] is a 77 y.o. male with medical history significant for chronic diastolic CHF, insulin-dependent diabetes mellitus, hypertension, history of CVA, and atrial fibrillation on Eliquis, now presenting to the emergency department for evaluation of generalized weakness, lethargy, and malaise.  Patient reports that the symptoms developed over the past several days.  He reports a mild chronic cough that is unchanged, denies abdominal pain or dysuria, and denies any significant rash or wound.  He reports lethargy and generalized weakness, but denies any new focal weakness or numbness.  Patient denies headache or neck stiffness.  ED Course: Upon arrival to the ED, patient is found to be febrile to 39.1 slightly tachycardic, slightly tachypneic, and saturating 89% on room air.  EKG features atrial fibrillation with nonspecific IVCD.  Chest x-ray is notable for cardiomegaly with mild vascular congestion.C, chemistry panel features a bilirubin of 1.3 and CBC is unremarkable.  Lactic acid is reassuringly normal.  Blood cultures were collected in the ED, 1 g of acetaminophen was given, and the patient was started on empiric vancomycin and Zosyn.  He remained stable in the ED and will be admitted to the telemetry unit for ongoing evaluation and management of SIRS.  Review of Systems:  All other systems reviewed and apart from HPI, are negative.  Past Medical History:  Diagnosis Date  . Atrial fibrillation ([place])   . Cholelithiasis 10/2011   Per CT. No cholecystitis radiographically.  . 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 Depression   . Diabetes mellitus   . GERD (gastroesophageal reflux disease)   . Hemorrhoids 2008  . Hyperlipidemia   . Hypertension     . Obesity   . 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  . Shortness of breath    exertion  . Sleep apnea    cpap  . Stroke [facility]) 2010/2013   Right Brain stroke    Past Surgical History:  Procedure Laterality Date  . CARDIOVERSION  07/12/2011   Procedure: CARDIOVERSION;  Surgeon: Pixie Casino, MD;  Location: [facility] ENDOSCOPY;  Service: Cardiovascular;  Laterality: N/A;  . CAROTID DOPPLER STUDY  01/31/2012   no significant extracranial carotid artery stenosis  . COLONOSCOPY    . DOPPLER ECHOCARDIOGRAPHY  01/31/2012   EF 55-60%, moderately calcified annulus of the mitral valve, left atrium demonstrated mild-moderately dilated  . KNEE SURGERY    . TEE WITHOUT CARDIOVERSION  07/12/2011   Procedure: TRANSESOPHAGEAL ECHOCARDIOGRAM (TEE);  Surgeon: Pixie Casino, MD;  Location: [facility] ENDOSCOPY;  Service: Cardiovascular;  Laterality: N/A;  to be done at 1330  . TONSILLECTOMY       reports that he has never smoked. He has never used smokeless tobacco. He reports that he drinks about 1.2 oz of alcohol per week. He reports that he does not use drugs.  No Known Allergies  Family History  Problem Relation Age of Onset  . Diabetes Mother   . Diabetes Son   . Pancreatic cancer Unknown        GRANDFATHER  . Colon cancer Unknown        INTESTINAL, GRANDMOTHER     Prior to Admission medications   Medication Sig Start Date End Date Taking? Authorizing Provider  amLODipine (NORVASC) 2.5 MG  tablet Take 2.5 mg by mouth daily.   Yes [provider]  apixaban (ELIQUIS) 5 MG TABS tablet Take 1 tablet (5 mg total) by mouth 2 (two) times daily. 10/30/13  Yes Hilty, Nadean Corwin, MD  carvedilol (COREG) 12.5 MG tablet Take 2 tablets (25 mg total) by mouth 2 (two) times daily with a meal. 05/16/11  Yes Delfina Redwood, MD  Choline Fenofibrate (FENOFIBRIC ACID) 135 MG CPDR Take 1 capsule by mouth daily.   Yes [provider]  escitalopram (LEXAPRO) 10 MG tablet  Take 10 mg by mouth daily.   Yes [provider]  ezetimibe (ZETIA) 10 MG tablet Take 10 mg by mouth daily. Reported on 07/20/2015   Yes [provider]  Insulin Disposable Pump (V-GO 30) KIT by Pump Prime route daily. Humalog used   Yes [provider]  insulin lispro (HUMALOG) 100 UNIT/ML injection Inject into the skin daily. Pump used   Yes [provider]  losartan-hydrochlorothiazide (HYZAAR) 100-25 MG tablet Take 1 tablet by mouth daily. 07/24/17  Yes [provider]  mirabegron ER (MYRBETRIQ) 50 MG TB24 tablet Take 50 mg by mouth daily.   Yes [provider]  simvastatin (ZOCOR) 80 MG tablet Take 80 mg by mouth daily.   Yes [provider]  glucose 4 GM chewable tablet Chew 1 tablet by mouth as needed for low blood sugar.    [provider]    Physical Exam: Vitals:   08/17/17 2200 08/17/17 2215 08/17/17 2257 08/17/17 2300  BP:    (!) 99/58  Pulse:  92  100  Resp:  (!) 23  (!) 24  Temp: (!) 102.3 F (39.1 C)  99.7 F (37.6 C)   TempSrc: Oral  Oral   SpO2:  93%  95%  Weight:          Constitutional: NAD, calm  Eyes: PERTLA, lids and conjunctivae normal ENMT: Mucous membranes are moist. Posterior pharynx clear of any exudate or lesions.   Neck: normal, supple, no masses, no thyromegaly Respiratory: clear to auscultation bilaterally, no wheezing, no crackles. Normal respiratory effort.   Cardiovascular: Rate ~100 and irregular. Trace pretibial edema bilaterally. Abdomen: No distension, no tenderness, soft. Bowel sounds normal.  Musculoskeletal: no clubbing / cyanosis. No joint deformity upper and lower extremities.   Skin: no significant rashes, lesions, ulcers. Warm, dry, well-perfused. Neurologic: No facial asymmetry. Mild dysarthria. Sensation to light touch intact. Moving all extremities equally.  Psychiatric: Alert and oriented person, place, and situation. Pleasant and cooperative.     Labs on  Admission: I have personally reviewed following labs and imaging studies  CBC: Recent Labs  Lab 08/17/17 2052  WBC 9.1  NEUTROABS 7.6  HGB 14.7  HCT 44.4  MCV 92.1  PLT 960   Basic Metabolic Panel: Recent Labs  Lab 08/17/17 2052  NA 137  K 3.5  CL 103  CO2 26  GLUCOSE 104*  BUN 18  CREATININE 1.13  CALCIUM 9.1   GFR: CrCl cannot be calculated (Unknown ideal weight.). Liver Function Tests: Recent Labs  Lab 08/17/17 2052  AST 31  ALT 26  ALKPHOS 46  BILITOT 1.3*  PROT 6.7  ALBUMIN 3.9   No results for input(s): LIPASE, AMYLASE in the last 168 hours. No results for input(s): AMMONIA in the last 168 hours. Coagulation Profile: No results for input(s): INR, PROTIME in the last 168 hours. Cardiac Enzymes: No results for input(s): CKTOTAL, CKMB, CKMBINDEX, TROPONINI in the last 168 hours.  BNP (last 3 results) No results for input(s): PROBNP in the last 8760 hours. HbA1C: No results for input(s): HGBA1C in the last 72 hours. CBG: Recent Labs  Lab 08/17/17 2024  GLUCAP 104*   Lipid Profile: No results for input(s): CHOL, HDL, LDLCALC, TRIG, CHOLHDL, LDLDIRECT in the last 72 hours. Thyroid Function Tests: No results for input(s): TSH, T4TOTAL, FREET4, T3FREE, THYROIDAB in the last 72 hours. Anemia Panel: No results for input(s): VITAMINB12, FOLATE, FERRITIN, TIBC, IRON, RETICCTPCT in the last 72 hours. Urine analysis:    Component Value Date/Time   COLORURINE YELLOW 03/13/2014 2240   APPEARANCEUR CLEAR 03/13/2014 2240   LABSPEC 1.015 03/13/2014 2240   PHURINE 6.5 03/13/2014 2240   GLUCOSEU >1000 (A) 03/13/2014 2240   HGBUR TRACE (A) 03/13/2014 2240   BILIRUBINUR NEGATIVE 03/13/2014 2240   KETONESUR NEGATIVE 03/13/2014 2240   PROTEINUR NEGATIVE 03/13/2014 2240   UROBILINOGEN 0.2 03/13/2014 2240   NITRITE NEGATIVE 03/13/2014 2240   LEUKOCYTESUR NEGATIVE 03/13/2014 2240   Sepsis Labs: @LABRCNTIP (procalcitonin:4,lacticidven:4) ) Recent Results (from  the past 240 hour(s))  Blood Culture (routine x 2)     Status: None (Preliminary result)   Collection Time: 08/17/17  9:10 PM  Result Value Ref Range Status   Specimen Description BLOOD RIGHT HAND  Final   Special Requests   Final    BOTTLES DRAWN AEROBIC AND ANAEROBIC Blood Culture adequate volume DRAWN BY RN Performed at [facility], [address], [place], [place] [address]    Culture PENDING  Incomplete   Report Status PENDING  Incomplete  Blood Culture (routine x 2)     Status: None (Preliminary result)   Collection Time: 08/17/17  9:31 PM  Result Value Ref Range Status   Specimen Description BLOOD RIGHT HAND  Final   Special Requests   Final    BOTTLES DRAWN AEROBIC AND ANAEROBIC Blood Culture adequate volume Performed at [facility], [address] [place], [place] [address]    Culture PENDING  Incomplete   Report Status PENDING  Incomplete     Radiological Exams on Admission: Dg Chest Port 1 View  Result Date: 08/17/2017 CLINICAL DATA:  Sepsis and chronic cough. EXAM: PORTABLE CHEST 1 VIEW COMPARISON:  03/13/2014 and prior exams FINDINGS: Cardiomegaly and mild pulmonary vascular congestion noted. Mild bibasilar atelectasis/scarring again noted. There is no evidence of focal airspace disease, pulmonary edema, suspicious pulmonary nodule/mass, pleural effusion, or pneumothorax. No acute bony abnormalities are identified. IMPRESSION: Cardiomegaly with mild pulmonary vascular congestion. Mild bibasilar atelectasis/scarring again noted. Electronically Signed   By: Margarette Canada M.D.   On: 08/17/2017 21:43    EKG: Independently reviewed. Atrial fibrillation, non-specific IVCD.   Assessment/Plan   1. SIRS  - Presents with generalized weakness, malaise, chills, and fatigue  - Found to be febrile with slight tachycardia and tachypnea, no leukocytosis or lactate elevation; no respiratory complaints and CXR clear; UA pending but denies dysuria or abd pain; no meningismus; no wounds    - Cultured in ED and started on empiric broad-spectrum abx  - Follow-up UA, continue empiric abx for now while following cultures and clinical course    2. Paroxysmal atrial fibrillation  - In rate-controlled a fib on admission  - CHADS-VASc 7 (age x2, CVA x2, HTN, CHF, DM)  - Continue Eliquis and beta-blocker    3. Chronic diastolic CHF  - Appears well-compensated  - Given 500 cc NS on admission d/t soft BP  - SLIV, follow daily wt, continue beta-blocker and ARB  4. Insulin-dependent DM  - A1c was 8.1% remotely  - Managed with insulin pump, will continue pump with CBG monitoring    5. Hypertension  - BP at goal  - Continue Norvasc, Coreg, and losartan-HCTZ as tolerated    6. Hx of CVA  - No new focal findings  - Continue statin and Eliquis     DVT prophylaxis: Eliquis  Code Status: Full  Family Communication: Discussed with patient Consults called: None Admission status: Inpatient    Vianne Bulls, MD [facility] Pager [phone]  If 7PM-7AM, please contact night-coverage [email or web address] Password [email or web address]  08/17/2017, 11:19 PM

## 2017-08-18 ENCOUNTER — Encounter (HOSPITAL_COMMUNITY): Payer: Self-pay | Admitting: *Deleted

## 2017-08-18 ENCOUNTER — Other Ambulatory Visit: Payer: Self-pay

## 2017-08-18 DIAGNOSIS — R6510 Systemic inflammatory response syndrome (SIRS) of non-infectious origin without acute organ dysfunction: Secondary | ICD-10-CM

## 2017-08-18 LAB — BASIC METABOLIC PANEL
Anion gap: 8 (ref 5–15)
BUN: 20 mg/dL (ref 6–20)
CO2: 28 mmol/L (ref 22–32)
Calcium: 8.8 mg/dL — ABNORMAL LOW (ref 8.9–10.3)
Chloride: 102 mmol/L (ref 101–111)
Creatinine, Ser: 1.27 mg/dL — ABNORMAL HIGH (ref 0.61–1.24)
GFR calc Af Amer: >60 mL/min (ref >60)
GFR calc non Af Amer: 53 mL/min — ABNORMAL LOW (ref >60)
Glucose, Bld: 81 mg/dL (ref 65–99)
Potassium: 3.7 mmol/L (ref 3.5–5.1)
Sodium: 138 mmol/L (ref 135–145)

## 2017-08-18 LAB — CBC WITH DIFFERENTIAL/PLATELET
Basophils Absolute: 0 10*3/uL (ref 0.0–0.1)
Basophils Relative: 0 %
Eosinophils Absolute: 0 10*3/uL (ref 0.0–0.7)
Eosinophils Relative: 0 %
HCT: 41.4 % (ref 39.0–52.0)
Hemoglobin: 13.5 g/dL (ref 13.0–17.0)
Lymphocytes Relative: 10 %
Lymphs Abs: 1 10*3/uL (ref 0.7–4.0)
MCH: 30.5 pg (ref 26.0–34.0)
MCHC: 32.6 g/dL (ref 30.0–36.0)
MCV: 93.7 fL (ref 78.0–100.0)
Monocytes Absolute: 1.3 10*3/uL — ABNORMAL HIGH (ref 0.1–1.0)
Monocytes Relative: 13 %
Neutro Abs: 8 10*3/uL — ABNORMAL HIGH (ref 1.7–7.7)
Neutrophils Relative %: 77 %
Platelets: 142 10*3/uL — ABNORMAL LOW (ref 150–400)
RBC: 4.42 MIL/uL (ref 4.22–5.81)
RDW: 12.9 % (ref 11.5–15.5)
WBC: 10.3 10*3/uL (ref 4.0–10.5)

## 2017-08-18 LAB — GLUCOSE, CAPILLARY
Glucose-Capillary: 90 mg/dL (ref 65–99)
Glucose-Capillary: 83 mg/dL (ref 65–99)
Glucose-Capillary: 144 mg/dL — ABNORMAL HIGH (ref 65–99)

## 2017-08-18 MED ORDER — VANCOMYCIN HCL IN DEXTROSE 1-5 GM/200ML-% IV SOLN
1000.0000 mg | Freq: Two times a day (BID) | INTRAVENOUS | Status: DC
  Administered 2017-08-18: 1000 mg via INTRAVENOUS
  Filled 2017-08-18: qty 200

## 2017-08-18 MED ORDER — PIPERACILLIN-TAZOBACTAM 3.375 G IVPB
3.3750 g | Freq: Once | INTRAVENOUS | Status: AC
  Administered 2017-08-18: 3.375 g via INTRAVENOUS
  Filled 2017-08-18: qty 50

## 2017-08-18 MED ORDER — HYDROCHLOROTHIAZIDE 25 MG PO TABS
25.0000 mg | ORAL_TABLET | Freq: Every day | ORAL | Status: DC
  Administered 2017-08-18: 25 mg via ORAL
  Filled 2017-08-18: qty 1

## 2017-08-18 MED ORDER — PIPERACILLIN-TAZOBACTAM 3.375 G IVPB
3.3750 g | Freq: Three times a day (TID) | INTRAVENOUS | Status: DC
  Filled 2017-08-18: qty 50

## 2017-08-18 MED ORDER — FENOFIBRATE 160 MG PO TABS: 160.0000 mg | ORAL_TABLET | Freq: Every day | ORAL | Status: DC

## 2017-08-18 NOTE — Progress Notes (Signed)
Discharge instructions gone over with patient and spouse, verbalized understanding. IV removed, patient tolerated procedure well. 

## 2017-08-18 NOTE — Discharge Summary (Addendum)
Physician Discharge Summary  [name]:[id number] DOB: 02/15/1941 DOA: 08/17/2017  PCP: Reynold Bowen, MD  Admit date: 08/17/2017 Discharge date: 08/18/2017  Time spent: 45 minutes  Recommendations for Outpatient Follow-up:  -Will be discharged home today. -Advised follow up with PCP in 2 weeks.   Discharge Diagnoses:  Principal Problem:   SIRS (systemic inflammatory response syndrome) (HCC) Active Problems:   DM type 2 (diabetes mellitus, type 2) (HCC)   Benign hypertension   H/O: stroke   PAF (paroxysmal atrial fibrillation) ([place])   Discharge Condition: Stable and improved  Filed Weights   08/17/17 2026 08/18/17 0111  Weight: 117.9 kg (260 lb) 102.3 kg (225 lb 8.5 oz)    History of present illness:  As per Dr. Myna Hidalgo on 5/18: [name] is a 77 y.o. male with medical history significant for chronic diastolic CHF, insulin-dependent diabetes mellitus, hypertension, history of CVA, and atrial fibrillation on Eliquis, now presenting to the emergency department for evaluation of generalized weakness, lethargy, and malaise.  Patient reports that the symptoms developed over the past several days.  He reports a mild chronic cough that is unchanged, denies abdominal pain or dysuria, and denies any significant rash or wound.  He reports lethargy and generalized weakness, but denies any new focal weakness or numbness.  Patient denies headache or neck stiffness.  ED Course: Upon arrival to the ED, patient is found to be febrile to 39.1 slightly tachycardic, slightly tachypneic, and saturating 89% on room air.  EKG features atrial fibrillation with nonspecific IVCD.  Chest x-ray is notable for cardiomegaly with mild vascular congestion.C, chemistry panel features a bilirubin of 1.3 and CBC is unremarkable.  Lactic acid is reassuringly normal.  Blood cultures were collected in the ED, 1 g of acetaminophen was given, and the patient was started on empiric vancomycin and Zosyn.  He  remained stable in the ED and will be admitted to the telemetry unit for ongoing evaluation and management of SIRS.    Hospital Course:   Sirs -Presented with fever, chills, malaise, generalized weakness.  Was found to have slight tachycardia and tachypnea, no leukocytosis or lactate elevation. -No localizing complaints. -UA and chest x-ray negative, culture data is negative to date. -Was given vancomycin and Zosyn empirically. -Given negative work-up, and the fact that patient feels back to baseline, wife corroborates, I do not believe that further antibiotics are necessary at this time. -Suspect some sort of viral infection that has now resolved.  Paroxysmal atrial fibrillation -Rate controlled, continue Eliquis and beta-blocker.  Chronic diastolic heart failure -Well compensated.  Benign essential hypertension -At goal, continue Norvasc, Coreg, losartan, hydrochlorothiazide.  Insulin-dependent diabetes mellitus -On insulin pump, continue, CBGs have been well controlled while hospitalized.  Procedures:  None   Consultations:  None  Discharge Instructions  Discharge Instructions    Diet - low sodium heart healthy   Complete by:  As directed    Increase activity slowly   Complete by:  As directed      Allergies as of 08/18/2017   No Known Allergies     Medication List    TAKE these medications   amLODipine 2.5 MG tablet Commonly known as:  NORVASC Take 2.5 mg by mouth daily.   apixaban 5 MG Tabs tablet Commonly known as:  ELIQUIS Take 1 tablet (5 mg total) by mouth 2 (two) times daily.   carvedilol 12.5 MG tablet Commonly known as:  COREG Take 2 tablets (25 mg total) by mouth 2 (  two) times daily with a meal.   escitalopram 10 MG tablet Commonly known as:  LEXAPRO Take 10 mg by mouth daily.   ezetimibe 10 MG tablet Commonly known as:  ZETIA Take 10 mg by mouth daily. Reported on 07/20/2015   Fenofibric Acid 135 MG Cpdr Take 1 capsule by mouth  daily.   glucose 4 GM chewable tablet Chew 1 tablet by mouth as needed for low blood sugar.   insulin lispro 100 UNIT/ML injection Commonly known as:  HUMALOG Inject into the skin daily. Pump used   losartan-hydrochlorothiazide 100-25 MG tablet Commonly known as:  HYZAAR Take 1 tablet by mouth daily.   MYRBETRIQ 50 MG Tb24 tablet Generic drug:  mirabegron ER Take 50 mg by mouth daily.   simvastatin 80 MG tablet Commonly known as:  ZOCOR Take 80 mg by mouth daily.   V-GO 30 Kit by Pump Prime route daily. Humalog used      No Known Allergies Follow-up Information    Reynold Bowen, MD. Schedule an appointment as soon as possible for a visit in 2 week(s).   Specialty:  Endocrinology Contact information: [place] [address] [phone]            The results of significant diagnostics from this hospitalization (including imaging, microbiology, ancillary and laboratory) are listed below for reference.    Significant Diagnostic Studies: Dg Chest Port 1 View  Result Date: 08/17/2017 CLINICAL DATA:  Sepsis and chronic cough. EXAM: PORTABLE CHEST 1 VIEW COMPARISON:  03/13/2014 and prior exams FINDINGS: Cardiomegaly and mild pulmonary vascular congestion noted. Mild bibasilar atelectasis/scarring again noted. There is no evidence of focal airspace disease, pulmonary edema, suspicious pulmonary nodule/mass, pleural effusion, or pneumothorax. No acute bony abnormalities are identified. IMPRESSION: Cardiomegaly with mild pulmonary vascular congestion. Mild bibasilar atelectasis/scarring again noted. Electronically Signed   By: Margarette Canada M.D.   On: 08/17/2017 21:43    Microbiology: Recent Results (from the past 240 hour(s))  Blood Culture (routine x 2)     Status: None (Preliminary result)   Collection Time: 08/17/17  9:10 PM  Result Value Ref Range Status   Specimen Description BLOOD RIGHT HAND  Final   Special Requests   Final    BOTTLES DRAWN AEROBIC  AND ANAEROBIC Blood Culture adequate volume DRAWN BY RN   Culture   Final    NO GROWTH < 12 HOURS Performed at [facility], [address], [place], [place] [address]    Report Status PENDING  Incomplete  Blood Culture (routine x 2)     Status: None (Preliminary result)   Collection Time: 08/17/17  9:31 PM  Result Value Ref Range Status   Specimen Description BLOOD RIGHT HAND  Final   Special Requests   Final    BOTTLES DRAWN AEROBIC AND ANAEROBIC Blood Culture adequate volume   Culture   Final    NO GROWTH < 12 HOURS Performed at [facility], [address] [place], [place] [address]    Report Status PENDING  Incomplete     Labs: Basic Metabolic Panel: Recent Labs  Lab 08/17/17 2052 08/18/17 0606  NA 137 138  K 3.5 3.7  CL 103 102  CO2 26 28  GLUCOSE 104* 81  BUN 18 20  CREATININE 1.13 1.27*  CALCIUM 9.1 8.8*   Liver Function Tests: Recent Labs  Lab 08/17/17 2052  AST 31  ALT 26  ALKPHOS 46  BILITOT 1.3*  PROT 6.7  ALBUMIN 3.9   No results  for input(s): LIPASE, AMYLASE in the last 168 hours. No results for input(s): AMMONIA in the last 168 hours. CBC: Recent Labs  Lab 08/17/17 2052 08/18/17 0606  WBC 9.1 10.3  NEUTROABS 7.6 8.0*  HGB 14.7 13.5  HCT 44.4 41.4  MCV 92.1 93.7  PLT 151 142*   Cardiac Enzymes: No results for input(s): CKTOTAL, CKMB, CKMBINDEX, TROPONINI in the last 168 hours. BNP: BNP (last 3 results) No results for input(s): BNP in the last 8760 hours.  ProBNP (last 3 results) No results for input(s): PROBNP in the last 8760 hours.  CBG: Recent Labs  Lab 08/17/17 2024 08/17/17 2329 08/18/17 0158 08/18/17 0752 08/18/17 1102  GLUCAP 104* 92 90 83 144*       Signed:  [facility] Pager: [phone] 08/18/2017, 12:17 PM

## 2017-08-18 NOTE — Discharge Instructions (Signed)
Advised follow up with PCP in 2 weeks.

## 2017-08-18 NOTE — Progress Notes (Signed)
Pharmacy Antibiotic Note  [name] is a 77 y.o. male admitted on 08/17/2017 with sepsis.  Pharmacy has been consulted for Vancomycin and Zosyn dosing.  Plan:  Vancomycin 1000mg  IV q12h Check trough at steady state Zosyn 3.375gm IV q8h, EID Monitor labs, renal fxn, progress and c/s Deescalate ABX when improved / appropriate.    Height: 5\' 8"  (172.7 cm) Weight: 225 lb 8.5 oz (102.3 kg) IBW/kg (Calculated) : 68.4  Temp (24hrs), Avg:100.7 F (38.2 C), Min:98.3 F (36.8 C), Max:103.9 F (39.9 C)  Recent Labs  Lab 08/17/17 2052 08/17/17 2109 08/18/17 0606  WBC 9.1  --  10.3  CREATININE 1.13  --  1.27*  LATICACIDVEN  --  1.51  --     Estimated Creatinine Clearance: 57.4 mL/min (A) (by C-G formula based on SCr of 1.27 mg/dL (H)).    No Known Allergies  Antimicrobials this admission: Vancomycin 5/18 >>  Zosyn 5/18 >>   Dose adjustments this admission:  Microbiology results:  BCx: pending  UCx: pending   Sputum:    MRSA PCR:   Thank you for allowing pharmacy to be a part of this patient's care.  Hart Robinsons A 08/18/2017 10:45 AM

## 2017-08-19 LAB — URINE CULTURE: Culture: NO GROWTH

## 2017-08-22 LAB — CULTURE, BLOOD (ROUTINE X 2)
Culture: NO GROWTH
Culture: NO GROWTH
Special Requests: ADEQUATE

## 2017-08-28 DIAGNOSIS — Z6835 Body mass index (BMI) 35.0-35.9, adult: Secondary | ICD-10-CM | POA: Diagnosis not present

## 2017-08-28 DIAGNOSIS — R2689 Other abnormalities of gait and mobility: Secondary | ICD-10-CM | POA: Diagnosis not present

## 2017-08-28 DIAGNOSIS — I872 Venous insufficiency (chronic) (peripheral): Secondary | ICD-10-CM | POA: Diagnosis not present

## 2017-08-28 DIAGNOSIS — I5032 Chronic diastolic (congestive) heart failure: Secondary | ICD-10-CM | POA: Diagnosis not present

## 2017-08-28 DIAGNOSIS — I480 Paroxysmal atrial fibrillation: Secondary | ICD-10-CM | POA: Diagnosis not present

## 2017-08-28 DIAGNOSIS — I639 Cerebral infarction, unspecified: Secondary | ICD-10-CM | POA: Diagnosis not present

## 2017-08-28 DIAGNOSIS — R5381 Other malaise: Secondary | ICD-10-CM | POA: Diagnosis not present

## 2017-08-28 DIAGNOSIS — R6510 Systemic inflammatory response syndrome (SIRS) of non-infectious origin without acute organ dysfunction: Secondary | ICD-10-CM | POA: Diagnosis not present

## 2017-09-12 ENCOUNTER — Other Ambulatory Visit: Payer: Self-pay

## 2017-09-12 ENCOUNTER — Ambulatory Visit (HOSPITAL_COMMUNITY): Payer: PPO | Attending: Endocrinology

## 2017-09-12 ENCOUNTER — Encounter (HOSPITAL_COMMUNITY): Payer: Self-pay

## 2017-09-12 ENCOUNTER — Telehealth (HOSPITAL_COMMUNITY): Payer: Self-pay | Admitting: Physical Therapy

## 2017-09-12 DIAGNOSIS — R260 Ataxic gait: Secondary | ICD-10-CM | POA: Diagnosis not present

## 2017-09-12 DIAGNOSIS — R2689 Other abnormalities of gait and mobility: Secondary | ICD-10-CM | POA: Diagnosis not present

## 2017-09-12 DIAGNOSIS — R2681 Unsteadiness on feet: Secondary | ICD-10-CM

## 2017-09-12 DIAGNOSIS — M6281 Muscle weakness (generalized): Secondary | ICD-10-CM | POA: Diagnosis not present

## 2017-09-12 NOTE — Therapy (Signed)
[place], [place], [address] Phone: [phone]   Fax:  [phone]  Physical Therapy Evaluation  Patient Details  Name: [name] MRN: [id number] Date of Birth: 1940-10-31 Referring Provider: Roque Cash, Toy Baker, NP   Encounter Date: 09/12/2017  PT End of Session - 09/12/17 1513    Visit Number  1    Number of Visits  12    Date for PT Re-Evaluation  10/24/17    Authorization Type  [employer] - co-pay $15.00, No visit limits; no auth required    Authorization - Visit Number  1    Authorization - Number of Visits  10    PT Start Time  1302    PT Stop Time  1350    PT Time Calculation (min)  48 min    Activity Tolerance  Patient tolerated treatment well    Behavior During Therapy  [facility] for tasks assessed/performed       Past Medical History:  Diagnosis Date  . Atrial fibrillation ([place])   . Cholelithiasis 10/2011   Per CT. No cholecystitis radiographically.  . 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 Depression   . Diabetes mellitus   . GERD (gastroesophageal reflux disease)   . Hemorrhoids 2008  . Hyperlipidemia   . Hypertension   . Obesity   . 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  . Shortness of breath    exertion  . Sleep apnea    cpap  . Stroke [facility]) 2010/2013   Right Brain stroke    Past Surgical History:  Procedure Laterality Date  . CARDIOVERSION  07/12/2011   Procedure: CARDIOVERSION;  Surgeon: Pixie Casino, MD;  Location[facility] ENDOSCOPY;  Service: Cardiovascular;  Laterality: N/A;  . CAROTID DOPPLER STUDY  01/31/2012   no significant extracranial carotid artery stenosis  . COLONOSCOPY    . DOPPLER ECHOCARDIOGRAPHY  01/31/2012   EF 55-60%, moderately calcified annulus of the mitral valve, left atrium demonstrated mild-moderately dilated  . KNEE SURGERY    . TEE WITHOUT CARDIOVERSION  07/12/2011    Procedure: TRANSESOPHAGEAL ECHOCARDIOGRAM (TEE);  Surgeon: Pixie Casino, MD;  Location: [facility] ENDOSCOPY;  Service: Cardiovascular;  Laterality: N/A;  to be done at 1330  . TONSILLECTOMY      There were no vitals filed for this visit.   Subjective Assessment - 09/12/17 1458    Subjective  Patient reports he had one stroke in 2017 and one in 2018. He progressively was getting weaker and weaker and asked his physician if he could come see PT. Then he got sick with a fever and was in hospital for a day or two recently. His wife states he has no peripheral vision. He has not driven since his 1st stroke in 2017. He would like to get stronger and be able to go for walks and do more around the house and yard to decrease the load on his wife. He reports he is able to mostly get himself bathed and dressed but his wife is always around to help if he needs it.     Pertinent History  DM, 2 strokes, decreased/absent peripheral vision, HTN, LBP, incontinent at times.    Limitations  Lifting;Standing;Walking;House hold activities    Patient Stated Goals  go for walks, be able to help more around the house and yard to decrease load on wife, get stronger    Currently  in Pain?  No/denies         [facility] PT Assessment - 09/12/17 0001      Assessment   Medical Diagnosis  stroke, immobility, gait, imbalance, deconditioning    Referring Provider  Roque Cash, Toy Baker, NP    Onset Date/Surgical Date  08/28/17    Hand Dominance  Right    Next MD Visit  sometime in later July    Prior Therapy  Yes after 1st stroke      Precautions   Precautions  None      Restrictions   Weight Bearing Restrictions  No      Balance Screen   Has the patient fallen in the past 6 months  No    Has the patient had a decrease in activity level because of a fear of falling?   Yes    Is the patient reluctant to leave their home because of a fear of falling?   No      Home Environment   Living Environment  Private  residence    Living Arrangements  Spouse/significant other    Home Access  Stairs to enter    Entrance Stairs-Number of Steps  3    Entrance Stairs-Rails  Right    [place]  One level      Prior Function   Level of Independence  Requires assistive device for independence drove prior to first stroke but hasn't driven since.    Vocation  Retired    Leisure  work in yard, garden, go for walks      Cognition   Overall Cognitive Status  Impaired/Different from baseline      Observation/Other Assessments   Focus on Therapeutic Outcomes (FOTO)   44% limited      Strength   Right Hip Flexion  4/5    Right Hip Extension  4-/5    Right Hip ABduction  4-/5    Left Hip Flexion  4/5    Left Hip Extension  3+/5    Left Hip ABduction  4-/5    Right Knee Flexion  4/5    Right Knee Extension  4+/5    Left Knee Flexion  4-/5    Left Knee Extension  4+/5    Right Ankle Dorsiflexion  4/5    Left Ankle Dorsiflexion  4-/5      Transfers   Five time sit to stand comments   24.01      Ambulation/Gait   Ambulation Distance (Feet)  246 Feet    Assistive device  None    Gait Pattern  Decreased arm swing - left;Decreased step length - right;Decreased stance time - left;Decreased stride length;Decreased hip/knee flexion - left;Decreased dorsiflexion - left;Decreased weight shift to left;Decreased trunk rotation;Poor foot clearance - left    Ambulation Surface  Level    Gait velocity  0.42 m/s       Balance   Balance Assessed  Yes      Static Standing Balance   Static Standing - Balance Support  No upper extremity supported    Static Standing - Level of Assistance  5: Stand by assistance    Static Standing Balance -  Activities   Single Leg Stance - Right Leg;Single Leg Stance - Left Leg    Static Standing - Comment/# of Minutes  Rt 3 sec; Lt 2 sec      Standardized Balance Assessment   Standardized Balance Assessment  Timed Up and Go Test  Timed Up and Go Test   Normal TUG (seconds)   31.13                Objective measurements completed on examination: See above findings.              PT Education - 09/12/17 1512    Education Details  Purpose of PT and HEP in strengthening, balance and gait training to increase function over time.     Person(s) Educated  Patient    Methods  Explanation    Comprehension  Verbalized understanding       PT Short Term Goals - 09/12/17 1526      PT SHORT TERM GOAL #1   Title  Patient will regularly perform and be independent with initial HEP.    Time  3    Period  Weeks    Status  New    Target Date  10/03/17      PT SHORT TERM GOAL #2   Title  Patient be able to balance on either LE for >10 seconds to indicate improved strength, balance and stability and decreased risk for falls.     Baseline  initial - Rt 2 sec; Lt 3 sec    Time  3    Period  Weeks    Status  New      PT SHORT TERM GOAL #3   Title  Patient will increased gait veleocity to >0.6 m/s to indicate decreasd risk for falls and improved ability to be a safe limited community ambulator.     Baseline  initial - 0.42 m/s      PT SHORT TERM GOAL #4   Title  Patient will improve his TUG time to <25 sec to indicate decreased risk for falls and improved dynamic stability.     Baseline  initial - 31.13 sec    Time  3    Period  Weeks    Status  New        PT Long Term Goals - 09/12/17 1532      PT LONG TERM GOAL #1   Title  Patient will regularly perform and be independent with advanced HEP.    Time  6    Period  Weeks    Status  New    Target Date  10/24/17      PT LONG TERM GOAL #2   Title  Patient be able to balance on either LE for >20 seconds to indicate improved strength, balance and stability and decreased risk for falls.     Baseline  initial - Rt 2 sec; Lt 3 sec    Time  6    Period  Weeks    Status  New      PT LONG TERM GOAL #3   Title  Patient will increased gait veleocity to >0.8 m/s to indicate decreased risk for falls  and improved ability to be a safe community ambulator.     Baseline  initial - 0.42 m/s    Time  6    Period  Weeks    Status  New      PT LONG TERM GOAL #4   Title  Patient will improve his TUG time to <20 sec to indicate decreased risk for falls and improved dynamic stability.     Baseline  initial - 31.13 sec    Time  6    Period  Weeks    Status  New  Plan - 09/12/17 1517    Clinical Impression Statement  Patient is a 77 year old male with history of 2 strokes since 2017. He reports he feels he has progressively gotten weaker and wanted to come to PT in order to get stronger so he can do more again and be more stable on his feet. Patient exhibits greater strength deficits on the left extremities than right, decreased balance, decreased functional abilities and impaired gait. Patient would benefit from skilled OPPT to address the aforementioned deficits.     History and Personal Factors relevant to plan of care:  DM, HTN, h/o 2 strokes, visual impairment for peripheral vision, LBP, occasionally incontinent, bilateral TKR    Clinical Presentation  Stable    Clinical Presentation due to:  FOTO, 5STS, TUG, SLS, MMT, 3MWT, clinical decision    Clinical Decision Making  Moderate    Rehab Potential  Good    Clinical Impairments Affecting Rehab Potential  negative - visual impairment, h/o strokes; positive - positive attitiude, willingness to put in the work    PT Frequency  2x / week    PT Duration  6 weeks    PT Treatment/Interventions  ADLs/Self Care Home Management;Aquatic Therapy;Gait training;Stair training;Functional mobility training;Therapeutic activities;Therapeutic exercise;Balance training;Patient/family education;Neuromuscular re-education;Manual techniques;Passive range of motion;Dry needling;Energy conservation    PT Next Visit Plan  review goals, copy of eval to patient, initiate LE strengthening exercises and balance training, ie prone/standing hip ext,  sidelying/standing hip abd, tandem balance, supine bridge, sidelying clams, fwd step ups, rockerobard    Consulted and Agree with Plan of Care  Patient       Patient will benefit from skilled therapeutic intervention in order to improve the following deficits and impairments:  Abnormal gait, Decreased cognition, Decreased coordination, Impaired tone, Decreased activity tolerance, Decreased strength, Decreased balance, Difficulty walking  Visit Diagnosis: Unsteadiness on feet  Ataxic gait  Muscle weakness (generalized)  Other abnormalities of gait and mobility     Problem List Patient Active Problem List   Diagnosis Date Noted  . SIRS (systemic inflammatory response syndrome) ([place]) 08/17/2017  . Aortic stenosis 04/27/2014  . PAF (paroxysmal atrial fibrillation) ([place]) 09/15/2013  . Anticoagulation adequate 09/15/2013  . Febrile illness, acute 10/26/2011  . Cough 10/26/2011  . DM type 2 (diabetes mellitus, type 2) ([place]) 05/15/2011  . Hyperlipemia 05/15/2011  . Obesity 05/15/2011  . Benign hypertension 05/15/2011  . H/O: stroke 05/15/2011    Floria Raveling. Hartnett-Rands, MS, PT Per [facility] #[id number] 09/12/2017, 3:41 PM  [place] [address] [place], [place], [address] Phone: [phone]   Fax:  [phone]  Name: DEV DHONDT MRN: [id number] Date of Birth: 1941-03-19

## 2017-09-12 NOTE — Telephone Encounter (Signed)
Had to switch dates

## 2017-09-17 ENCOUNTER — Ambulatory Visit (HOSPITAL_COMMUNITY): Payer: PPO

## 2017-09-17 ENCOUNTER — Encounter (HOSPITAL_COMMUNITY): Payer: Self-pay

## 2017-09-17 DIAGNOSIS — R2689 Other abnormalities of gait and mobility: Secondary | ICD-10-CM

## 2017-09-17 DIAGNOSIS — R2681 Unsteadiness on feet: Secondary | ICD-10-CM | POA: Diagnosis not present

## 2017-09-17 DIAGNOSIS — R260 Ataxic gait: Secondary | ICD-10-CM

## 2017-09-17 DIAGNOSIS — M6281 Muscle weakness (generalized): Secondary | ICD-10-CM

## 2017-09-17 NOTE — Patient Instructions (Addendum)
Bridging    Slowly raise buttocks from floor, keeping stomach tight. Repeat 10 times per set. Do 1-2 sets per session. Do 1-2 sessions per day.  http://orth.exer.us/1096   Copyright  VHI. All rights reserved.   Abduction: Side Leg Lift (Eccentric) - Side-Lying    Lie on side. Lift top leg slightly higher than shoulder level. Keep top leg straight with body, toes pointing forward. Slowly lower for 3-5 seconds.  10 reps per set, 2 sets per day, 4 days per week.   http://ecce.exer.us/62   Copyright  VHI. All rights reserved.   Clam Shell 45 Degrees    Lying with hips and knees bent 45, with theraband around knees. Lift top knee. Be sure pelvis does not roll backward. Do not arch back. Do 10 times, each leg, 1-2 times per day.  http://ss.exer.us/74   Copyright  VHI. All rights reserved.   Functional Quadriceps: Sit to Stand    Sit on edge of chair, feet flat on floor. Stand upright, extending knees fully. Repeat 10 times per set. Do 2 sets per session. .  http://orth.exer.us/734   Copyright  VHI. All rights reserved.

## 2017-09-17 NOTE — Therapy (Signed)
[place], [place], [address] Phone: [phone]   Fax:  [phone]  Physical Therapy Treatment  Patient Details  Name: [name] MRN: [id number] Date of Birth: Apr 05, 1940 Referring Provider: Roque Cash, Toy Baker, NP   Encounter Date: 09/17/2017  PT End of Session - 09/17/17 1523    Visit Number  2    Number of Visits  12    Date for PT Re-Evaluation  10/24/17 Minireassess 10/10/17    Authorization Type  [employer] - co-pay $15.00, No visit limits; no auth required    Authorization Time Period  6/13-->10/24/17    Authorization - Visit Number  2    Authorization - Number of Visits  10    PT Start Time  1519    PT Stop Time  7654    PT Time Calculation (min)  45 min    Equipment Utilized During Treatment  Gait belt    Activity Tolerance  Patient tolerated treatment well    Behavior During Therapy  [facility] for tasks assessed/performed       Past Medical History:  Diagnosis Date  . Atrial fibrillation ([place])   . Cholelithiasis 10/2011   Per CT. No cholecystitis radiographically.  . 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 Depression   . Diabetes mellitus   . GERD (gastroesophageal reflux disease)   . Hemorrhoids 2008  . Hyperlipidemia   . Hypertension   . Obesity   . 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  . Shortness of breath    exertion  . Sleep apnea    cpap  . Stroke [facility]) 2010/2013   Right Brain stroke    Past Surgical History:  Procedure Laterality Date  . CARDIOVERSION  07/12/2011   Procedure: CARDIOVERSION;  Surgeon: Pixie Casino, MD;  Location: [facility] ENDOSCOPY;  Service: Cardiovascular;  Laterality: N/A;  . CAROTID DOPPLER STUDY  01/31/2012   no significant extracranial carotid artery stenosis  . COLONOSCOPY    . DOPPLER ECHOCARDIOGRAPHY  01/31/2012   EF 55-60%, moderately calcified annulus of the  mitral valve, left atrium demonstrated mild-moderately dilated  . KNEE SURGERY    . TEE WITHOUT CARDIOVERSION  07/12/2011   Procedure: TRANSESOPHAGEAL ECHOCARDIOGRAM (TEE);  Surgeon: Pixie Casino, MD;  Location: [facility] ENDOSCOPY;  Service: Cardiovascular;  Laterality: N/A;  to be done at 1330  . TONSILLECTOMY      There were no vitals filed for this visit.  Subjective Assessment - 09/17/17 1521    Subjective  Pt stated he is feeling good today, no reports of pain or recent falls.  Pt stated he has a slight decrease activity tolerance and weakness.      Patient Stated Goals  go for walks, be able to help more around the house and yard to decrease load on wife, get stronger    Currently in Pain?  No/denies         [facility] PT Assessment - 09/17/17 0001      Assessment   Next MD Visit  sometime in later July                   [facility] Adult PT Treatment/Exercise - 09/17/17 0001      Ambulation/Gait   Ambulation Distance (Feet)  250 Feet    Assistive device  Straight cane    Gait Comments  cueing for sequence with [facility]  Exercises   Exercises  Knee/Hip      Knee/Hip Exercises: Seated   Sit to Sand  10 reps;without UE support      Knee/Hip Exercises: Supine   Bridges  2 sets;10 reps      Knee/Hip Exercises: Sidelying   Hip ABduction  Both;Strengthening;2 sets;10 reps    Hip ABduction Limitations  cueing for form    Clams  10x 5" RTB      Knee/Hip Exercises: Prone   Hip Extension  Both;10 reps    Hip Extension Limitations  cueing for mechanics               PT Short Term Goals - 09/12/17 1526      PT SHORT TERM GOAL #1   Title  Patient will regularly perform and be independent with initial HEP.    Time  3    Period  Weeks    Status  New    Target Date  10/03/17      PT SHORT TERM GOAL #2   Title  Patient be able to balance on either LE for >10 seconds to indicate improved strength, balance and stability and decreased risk for falls.     Baseline   initial - Rt 2 sec; Lt 3 sec    Time  3    Period  Weeks    Status  New      PT SHORT TERM GOAL #3   Title  Patient will increased gait veleocity to >0.6 m/s to indicate decreasd risk for falls and improved ability to be a safe limited community ambulator.     Baseline  initial - 0.42 m/s      PT SHORT TERM GOAL #4   Title  Patient will improve his TUG time to <25 sec to indicate decreased risk for falls and improved dynamic stability.     Baseline  initial - 31.13 sec    Time  3    Period  Weeks    Status  New        PT Long Term Goals - 09/12/17 1532      PT LONG TERM GOAL #1   Title  Patient will regularly perform and be independent with advanced HEP.    Time  6    Period  Weeks    Status  New    Target Date  10/24/17      PT LONG TERM GOAL #2   Title  Patient be able to balance on either LE for >20 seconds to indicate improved strength, balance and stability and decreased risk for falls.     Baseline  initial - Rt 2 sec; Lt 3 sec    Time  6    Period  Weeks    Status  New      PT LONG TERM GOAL #3   Title  Patient will increased gait veleocity to >0.8 m/s to indicate decreased risk for falls and improved ability to be a safe community ambulator.     Baseline  initial - 0.42 m/s    Time  6    Period  Weeks    Status  New      PT LONG TERM GOAL #4   Title  Patient will improve his TUG time to <20 sec to indicate decreased risk for falls and improved dynamic stability.     Baseline  initial - 31.13 sec    Time  6    Period  Weeks  Status  New            Plan - 09/17/17 1755    Clinical Impression Statement  Reviewed goals and copy of eval given to pt.  Session focus on proximal strengthening and improving gait mechanics.  Established HEP this session focusing on gluteal strengthening, able to demonstrate and verbalize understanding wiht new exercises.  EOS with focus on improving sequence with cane, pt wiht moderate difficulty and required constant cueing  for proper sequence.  Continue gait training next session.      Rehab Potential  Good    Clinical Impairments Affecting Rehab Potential  negative - visual impairment, h/o strokes; positive - positive attitiude, willingness to put in the work    PT Frequency  2x / week    PT Duration  6 weeks    PT Treatment/Interventions  ADLs/Self Care Home Management;Aquatic Therapy;Gait training;Stair training;Functional mobility training;Therapeutic activities;Therapeutic exercise;Balance training;Patient/family education;Neuromuscular re-education;Manual techniques;Passive range of motion;Dry needling;Energy conservation    PT Next Visit Plan  F/U wiht compliance with HEP.  Continue focus iwht LE strengthening and balance training.  Next session focus on sequencing with SPC, add rocker board, tandem stance, standing abd/extension, and fwd step ups    PT Home Exercise Plan  6/18: bridges, s/l clam and abd, STS without HHA       Patient will benefit from skilled therapeutic intervention in order to improve the following deficits and impairments:  Abnormal gait, Decreased cognition, Decreased coordination, Impaired tone, Decreased activity tolerance, Decreased strength, Decreased balance, Difficulty walking  Visit Diagnosis: Unsteadiness on feet  Ataxic gait  Muscle weakness (generalized)  Other abnormalities of gait and mobility     Problem List Patient Active Problem List   Diagnosis Date Noted  . SIRS (systemic inflammatory response syndrome) ([place]) 08/17/2017  . Aortic stenosis 04/27/2014  . PAF (paroxysmal atrial fibrillation) ([place]) 09/15/2013  . Anticoagulation adequate 09/15/2013  . Febrile illness, acute 10/26/2011  . Cough 10/26/2011  . DM type 2 (diabetes mellitus, type 2) ([place]) 05/15/2011  . Hyperlipemia 05/15/2011  . Obesity 05/15/2011  . Benign hypertension 05/15/2011  . H/O: stroke 05/15/2011   Ihor Austin, [place]; [place]  Aldona Lento 09/17/2017, 6:01  PM  [place], [place], [address] Phone: [phone]   Fax:  [phone]  Name: [name] MRN: [id number] Date of Birth: 1940-09-21

## 2017-09-20 ENCOUNTER — Encounter (HOSPITAL_COMMUNITY): Payer: Self-pay

## 2017-09-20 ENCOUNTER — Ambulatory Visit (HOSPITAL_COMMUNITY): Payer: PPO

## 2017-09-20 DIAGNOSIS — R260 Ataxic gait: Secondary | ICD-10-CM

## 2017-09-20 DIAGNOSIS — R2681 Unsteadiness on feet: Secondary | ICD-10-CM | POA: Diagnosis not present

## 2017-09-20 DIAGNOSIS — M6281 Muscle weakness (generalized): Secondary | ICD-10-CM

## 2017-09-20 DIAGNOSIS — R2689 Other abnormalities of gait and mobility: Secondary | ICD-10-CM

## 2017-09-20 NOTE — Therapy (Signed)
[place], [place], [address] Phone: [phone]   Fax:  [phone]  Physical Therapy Treatment  Patient Details  Name: [name] MRN: [id number] Date of Birth: [birth year] Referring Provider: Roque Cash, Toy Baker, NP   Encounter Date: 09/20/2017  PT End of Session - 09/20/17 1614    Visit Number  3    Number of Visits  12    Date for PT Re-Evaluation  10/24/17 MInireassess 10/10/17    Authorization Type  [employer] - co-pay $15.00, No visit limits; no auth required    Authorization Time Period  6/13-->10/24/17    Authorization - Visit Number  3    Authorization - Number of Visits  10    PT Start Time  2119    PT Stop Time  1645    PT Time Calculation (min)  47 min    Equipment Utilized During Treatment  Gait belt    Activity Tolerance  Patient tolerated treatment well    Behavior During Therapy  [facility] for tasks assessed/performed       Past Medical History:  Diagnosis Date  . Atrial fibrillation ([place])   . Cholelithiasis 10/2011   Per CT. No cholecystitis radiographically.  . 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 Depression   . Diabetes mellitus   . GERD (gastroesophageal reflux disease)   . Hemorrhoids 2008  . Hyperlipidemia   . Hypertension   . Obesity   . 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  . Shortness of breath    exertion  . Sleep apnea    cpap  . Stroke [facility]) 2010/2013   Right Brain stroke    Past Surgical History:  Procedure Laterality Date  . CARDIOVERSION  07/12/2011   Procedure: CARDIOVERSION;  Surgeon: Pixie Casino, MD;  Location: [facility] ENDOSCOPY;  Service: Cardiovascular;  Laterality: N/A;  . CAROTID DOPPLER STUDY  01/31/2012   no significant extracranial carotid artery stenosis  . COLONOSCOPY    . DOPPLER ECHOCARDIOGRAPHY  01/31/2012   EF 55-60%, moderately calcified annulus of the  mitral valve, left atrium demonstrated mild-moderately dilated  . KNEE SURGERY    . TEE WITHOUT CARDIOVERSION  07/12/2011   Procedure: TRANSESOPHAGEAL ECHOCARDIOGRAM (TEE);  Surgeon: Pixie Casino, MD;  Location: [facility] ENDOSCOPY;  Service: Cardiovascular;  Laterality: N/A;  to be done at 1330  . TONSILLECTOMY      There were no vitals filed for this visit.  Subjective Assessment - 09/20/17 1613    Subjective  Pt stated he is feeling good today, has began HEP daily.  No reports of pain or recent falls.      Pertinent History  DM, 2 strokes, decreased/absent peripheral vision, HTN, LBP, incontinent at times.    Patient Stated Goals  go for walks, be able to help more around the house and yard to decrease load on wife, get stronger    Currently in Pain?  No/denies                       [facility] Adult PT Treatment/Exercise - 09/20/17 0001      Ambulation/Gait   Ambulation Distance (Feet)  226 Feet    Assistive device  Straight cane    Gait Comments  max cueing for sequence with [facility]      Knee/Hip Exercises: Stretches   Gastroc Stretch  3 reps;30 seconds;Limitations  Gastroc Stretch Limitations  slant board      Knee/Hip Exercises: Standing   Heel Raises  15 reps;Limitations    Heel Raises Limitations  toe raises 15 incline slope    Hip Abduction  Both;10 reps;Knee straight 3" holds    Abduction Limitations  cueing for posture and form    Functional Squat  10 reps    Rocker Board  2 minutes lateral and DF/PF    Gait Training  2x 226 focusing on sequencing with SPC    Other Standing Knee Exercises  tandem stance 3x 30"      Knee/Hip Exercises: Seated   Sit to Sand  10 reps;without UE support eccentric control               PT Short Term Goals - 09/12/17 1526      PT SHORT TERM GOAL #1   Title  Patient will regularly perform and be independent with initial HEP.    Time  3    Period  Weeks    Status  New    Target Date  10/03/17      PT SHORT TERM GOAL  #2   Title  Patient be able to balance on either LE for >10 seconds to indicate improved strength, balance and stability and decreased risk for falls.     Baseline  initial - Rt 2 sec; Lt 3 sec    Time  3    Period  Weeks    Status  New      PT SHORT TERM GOAL #3   Title  Patient will increased gait veleocity to >0.6 m/s to indicate decreasd risk for falls and improved ability to be a safe limited community ambulator.     Baseline  initial - 0.42 m/s      PT SHORT TERM GOAL #4   Title  Patient will improve his TUG time to <25 sec to indicate decreased risk for falls and improved dynamic stability.     Baseline  initial - 31.13 sec    Time  3    Period  Weeks    Status  New        PT Long Term Goals - 09/12/17 1532      PT LONG TERM GOAL #1   Title  Patient will regularly perform and be independent with advanced HEP.    Time  6    Period  Weeks    Status  New    Target Date  10/24/17      PT LONG TERM GOAL #2   Title  Patient be able to balance on either LE for >20 seconds to indicate improved strength, balance and stability and decreased risk for falls.     Baseline  initial - Rt 2 sec; Lt 3 sec    Time  6    Period  Weeks    Status  New      PT LONG TERM GOAL #3   Title  Patient will increased gait veleocity to >0.8 m/s to indicate decreased risk for falls and improved ability to be a safe community ambulator.     Baseline  initial - 0.42 m/s    Time  6    Period  Weeks    Status  New      PT LONG TERM GOAL #4   Title  Patient will improve his TUG time to <20 sec to indicate decreased risk for falls and improved dynamic stability.  Baseline  initial - 31.13 sec    Time  6    Period  Weeks    Status  New            Plan - 09/20/17 1845    Clinical Impression Statement  Session focus on gait training, balance and LE strengthening.  Pt continues to have moderate difficulty with sequence with cane today, improved with 2 point sequence though does continue to  require cueing.  Began rockerboard to improve weight distribution wiht gait, noted Lt heel raised.  Added gastroc stretches to improve flexibilty with gait.  Therapist facilitaiton for proper form with CKC, verbal and tactile cueing.  Pt was limited by fatigue at EOS, did have some seated rest breaks through session.      Rehab Potential  Good    Clinical Impairments Affecting Rehab Potential  negative - visual impairment, h/o strokes; positive - positive attitiude, willingness to put in the work    PT Frequency  2x / week    PT Duration  6 weeks    PT Treatment/Interventions  ADLs/Self Care Home Management;Aquatic Therapy;Gait training;Stair training;Functional mobility training;Therapeutic activities;Therapeutic exercise;Balance training;Patient/family education;Neuromuscular re-education;Manual techniques;Passive range of motion;Dry needling;Energy conservation    PT Next Visit Plan  Continue focus iwht LE strengthening and balance training.  Next session focus on sequencing with SPC, continue rocker board, tandem stance, standing abd, squats and begin fwd step ups and marching with holds next session.      PT Home Exercise Plan  6/18: bridges, s/l clam and abd, STS without HHA       Patient will benefit from skilled therapeutic intervention in order to improve the following deficits and impairments:  Abnormal gait, Decreased cognition, Decreased coordination, Impaired tone, Decreased activity tolerance, Decreased strength, Decreased balance, Difficulty walking  Visit Diagnosis: Unsteadiness on feet  Ataxic gait  Muscle weakness (generalized)  Other abnormalities of gait and mobility     Problem List Patient Active Problem List   Diagnosis Date Noted  . SIRS (systemic inflammatory response syndrome) ([place]) 08/17/2017  . Aortic stenosis 04/27/2014  . PAF (paroxysmal atrial fibrillation) ([place]) 09/15/2013  . Anticoagulation adequate 09/15/2013  . Febrile illness, acute 10/26/2011  .  Cough 10/26/2011  . DM type 2 (diabetes mellitus, type 2) ([place]) 05/15/2011  . Hyperlipemia 05/15/2011  . Obesity 05/15/2011  . Benign hypertension 05/15/2011  . H/O: stroke 05/15/2011   Ihor Austin, [place]; [place]  Aldona Lento 09/20/2017, 6:51 PM  [place], [place], [address] Phone: [phone]   Fax:  [phone]  Name: JARMON JAVID MRN: [id number] Date of Birth: [birth year]

## 2017-09-24 ENCOUNTER — Ambulatory Visit (HOSPITAL_COMMUNITY): Payer: PPO | Admitting: Physical Therapy

## 2017-09-24 DIAGNOSIS — M6281 Muscle weakness (generalized): Secondary | ICD-10-CM

## 2017-09-24 DIAGNOSIS — R2681 Unsteadiness on feet: Secondary | ICD-10-CM

## 2017-09-24 DIAGNOSIS — R260 Ataxic gait: Secondary | ICD-10-CM

## 2017-09-24 DIAGNOSIS — R2689 Other abnormalities of gait and mobility: Secondary | ICD-10-CM

## 2017-09-24 NOTE — Therapy (Signed)
[name], [place], [address] Phone: [phone]   Fax:  [phone]  Physical Therapy Treatment  Patient Details  Name: [name] MRN: [id number] Date of Birth: Aug 23, 1940 Referring Provider: Roque Cash, Toy Baker, NP   Encounter Date: 09/24/2017  PT End of Session - 09/24/17 1425    Visit Number  4    Number of Visits  12    Date for PT Re-Evaluation  10/24/17 MInireassess 10/10/17    Authorization Type  [employer] - co-pay $15.00, No visit limits; no auth required    Authorization Time Period  6/13-->10/24/17    Authorization - Visit Number  4    Authorization - Number of Visits  10    PT Start Time  1300    PT Stop Time  1345    PT Time Calculation (min)  45 min    Equipment Utilized During Treatment  Gait belt    Activity Tolerance  Patient tolerated treatment well    Behavior During Therapy  [facility] for tasks assessed/performed       Past Medical History:  Diagnosis Date  . Atrial fibrillation ([place])   . Cholelithiasis 10/2011   Per CT. No cholecystitis radiographically.  . 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 Depression   . Diabetes mellitus   . GERD (gastroesophageal reflux disease)   . Hemorrhoids 2008  . Hyperlipidemia   . Hypertension   . Obesity   . 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  . Shortness of breath    exertion  . Sleep apnea    cpap  . Stroke [facility]) 2010/2013   Right Brain stroke    Past Surgical History:  Procedure Laterality Date  . CARDIOVERSION  07/12/2011   Procedure: CARDIOVERSION;  Surgeon: Pixie Casino, MD;  Location: [facility] ENDOSCOPY;  Service: Cardiovascular;  Laterality: N/A;  . CAROTID DOPPLER STUDY  01/31/2012   no significant extracranial carotid artery stenosis  . COLONOSCOPY    . DOPPLER ECHOCARDIOGRAPHY  01/31/2012   EF 55-60%, moderately calcified annulus of the  mitral valve, left atrium demonstrated mild-moderately dilated  . KNEE SURGERY    . TEE WITHOUT CARDIOVERSION  07/12/2011   Procedure: TRANSESOPHAGEAL ECHOCARDIOGRAM (TEE);  Surgeon: Pixie Casino, MD;  Location: [facility] ENDOSCOPY;  Service: Cardiovascular;  Laterality: N/A;  to be done at 1330  . TONSILLECTOMY      There were no vitals filed for this visit.  Subjective Assessment - 09/24/17 1312    Subjective  Pt states he is not having any issues today.  Continues to use [facility] and working on his sequencing.  Reports compliance with HEP.    Currently in Pain?  No/denies                       [facility] Adult PT Treatment/Exercise - 09/24/17 0001      Ambulation/Gait   Ambulation Distance (Feet)  226 Feet    Assistive device  Straight cane    Gait Comments  max cueing for sequence with SPC, taking larger steps      Knee/Hip Exercises: Stretches   Gastroc Stretch  3 reps;30 seconds;Limitations    Gastroc Stretch Limitations  slant board      Knee/Hip Exercises: Standing   Heel Raises  15 reps;Limitations    Heel Raises Limitations  toe raises 15 incline slope    Forward  Step Up  Both;10 reps;Hand Hold: 1;Step Height: 4"    Functional Squat  10 reps    Rocker Board  2 minutes;Limitations    Rocker Board Limitations  R/L and A/P    SLS  bilaterally 3X each with max of 6" Rt, 2" Lt.  no UE    Other Standing Knee Exercises  tandem stance 3x 30"               PT Short Term Goals - 09/12/17 1526      PT SHORT TERM GOAL #1   Title  Patient will regularly perform and be independent with initial HEP.    Time  3    Period  Weeks    Status  New    Target Date  10/03/17      PT SHORT TERM GOAL #2   Title  Patient be able to balance on either LE for >10 seconds to indicate improved strength, balance and stability and decreased risk for falls.     Baseline  initial - Rt 2 sec; Lt 3 sec    Time  3    Period  Weeks    Status  New      PT SHORT TERM GOAL #3   Title   Patient will increased gait veleocity to >0.6 m/s to indicate decreasd risk for falls and improved ability to be a safe limited community ambulator.     Baseline  initial - 0.42 m/s      PT SHORT TERM GOAL #4   Title  Patient will improve his TUG time to <25 sec to indicate decreased risk for falls and improved dynamic stability.     Baseline  initial - 31.13 sec    Time  3    Period  Weeks    Status  New        PT Long Term Goals - 09/12/17 1532      PT LONG TERM GOAL #1   Title  Patient will regularly perform and be independent with advanced HEP.    Time  6    Period  Weeks    Status  New    Target Date  10/24/17      PT LONG TERM GOAL #2   Title  Patient be able to balance on either LE for >20 seconds to indicate improved strength, balance and stability and decreased risk for falls.     Baseline  initial - Rt 2 sec; Lt 3 sec    Time  6    Period  Weeks    Status  New      PT LONG TERM GOAL #3   Title  Patient will increased gait veleocity to >0.8 m/s to indicate decreased risk for falls and improved ability to be a safe community ambulator.     Baseline  initial - 0.42 m/s    Time  6    Period  Weeks    Status  New      PT LONG TERM GOAL #4   Title  Patient will improve his TUG time to <20 sec to indicate decreased risk for falls and improved dynamic stability.     Baseline  initial - 31.13 sec    Time  6    Period  Weeks    Status  New            Plan - 09/24/17 1417    Clinical Impression Statement  Continued with focus on balance and  gait training as well as LE strengthening.  Continues to require continuous cues using SPC for sequence and increasing step length.  Pt with improved SLS time today on Lt LE to 6 seconds without UE assist, Rt remains max of 2". Began marching holds and forward step ups today for strengthening.  Pt required 2 short seated rest breaks during session today.      Rehab Potential  Good    Clinical Impairments Affecting Rehab  Potential  negative - visual impairment, h/o strokes; positive - positive attitiude, willingness to put in the work    PT Frequency  2x / week    PT Duration  6 weeks    PT Treatment/Interventions  ADLs/Self Care Home Management;Aquatic Therapy;Gait training;Stair training;Functional mobility training;Therapeutic activities;Therapeutic exercise;Balance training;Patient/family education;Neuromuscular re-education;Manual techniques;Passive range of motion;Dry needling;Energy conservation    PT Next Visit Plan  Continue focus iwht LE strengthening, balance training and sequencing with SPC.  Next session begin vector stance to improve SLS ability.  Add lateral step ups.    PT Home Exercise Plan  6/18: bridges, s/l clam and abd, STS without HHA       Patient will benefit from skilled therapeutic intervention in order to improve the following deficits and impairments:  Abnormal gait, Decreased cognition, Decreased coordination, Impaired tone, Decreased activity tolerance, Decreased strength, Decreased balance, Difficulty walking  Visit Diagnosis: Unsteadiness on feet  Ataxic gait  Other abnormalities of gait and mobility  Muscle weakness (generalized)     Problem List Patient Active Problem List   Diagnosis Date Noted  . SIRS (systemic inflammatory response syndrome) ([place]) 08/17/2017  . Aortic stenosis 04/27/2014  . PAF (paroxysmal atrial fibrillation) ([place]) 09/15/2013  . Anticoagulation adequate 09/15/2013  . Febrile illness, acute 10/26/2011  . Cough 10/26/2011  . DM type 2 (diabetes mellitus, type 2) ([place]) 05/15/2011  . Hyperlipemia 05/15/2011  . Obesity 05/15/2011  . Benign hypertension 05/15/2011  . H/O: stroke 05/15/2011   Teena Irani, PTA/CLT [phone]  Teena Irani 09/24/2017, 2:26 PM  [place], [place], [address] Phone: [phone]   Fax:  [phone]  Name: TREMEL SETTERS MRN: [id number] Date  of Birth: 09-Jul-1940

## 2017-09-26 ENCOUNTER — Ambulatory Visit (HOSPITAL_COMMUNITY): Payer: PPO | Admitting: Physical Therapy

## 2017-09-26 DIAGNOSIS — R260 Ataxic gait: Secondary | ICD-10-CM

## 2017-09-26 DIAGNOSIS — R2681 Unsteadiness on feet: Secondary | ICD-10-CM | POA: Diagnosis not present

## 2017-09-26 DIAGNOSIS — M6281 Muscle weakness (generalized): Secondary | ICD-10-CM

## 2017-09-26 DIAGNOSIS — R2689 Other abnormalities of gait and mobility: Secondary | ICD-10-CM

## 2017-09-26 NOTE — Therapy (Signed)
[place], [place], [address] Phone: [phone]   Fax:  [phone]  Physical Therapy Treatment  Patient Details  Name: [name] MRN: [id number] Date of Birth: [birth year] Referring Provider: Roque Cash, Toy Baker, NP   Encounter Date: 09/26/2017  PT End of Session - 09/26/17 1738    Visit Number  5    Number of Visits  12    Date for PT Re-Evaluation  10/24/17 MInireassess 10/10/17    Authorization Type  [employer] - co-pay $15.00, No visit limits; no auth required    Authorization Time Period  6/13-->10/24/17    Authorization - Visit Number  5    Authorization - Number of Visits  10    PT Start Time  1525    PT Stop Time  1610    PT Time Calculation (min)  45 min    Equipment Utilized During Treatment  Gait belt    Activity Tolerance  Patient tolerated treatment well    Behavior During Therapy  [facility] for tasks assessed/performed       Past Medical History:  Diagnosis Date  . Atrial fibrillation ([place])   . Cholelithiasis 10/2011   Per CT. No cholecystitis radiographically.  . 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 Depression   . Diabetes mellitus   . GERD (gastroesophageal reflux disease)   . Hemorrhoids 2008  . Hyperlipidemia   . Hypertension   . Obesity   . [facility] spotted fever) 10/26/2011   IgG positive.  . Shortness of breath    exertion  . Sleep apnea    cpap  . Stroke [facility]) 2010/2013   Right Brain stroke    Past Surgical History:  Procedure Laterality Date  . CARDIOVERSION  07/12/2011   Procedure: CARDIOVERSION;  Surgeon: Pixie Casino, MD;  Location: [facility] ENDOSCOPY;  Service: Cardiovascular;  Laterality: N/A;  . CAROTID DOPPLER STUDY  01/31/2012   no significant extracranial carotid artery stenosis  . COLONOSCOPY    . DOPPLER ECHOCARDIOGRAPHY  01/31/2012   EF 55-60%, moderately calcified annulus of the  mitral valve, left atrium demonstrated mild-moderately dilated  . KNEE SURGERY    . TEE WITHOUT CARDIOVERSION  07/12/2011   Procedure: TRANSESOPHAGEAL ECHOCARDIOGRAM (TEE);  Surgeon: Pixie Casino, MD;  Location: [facility] ENDOSCOPY;  Service: Cardiovascular;  Laterality: N/A;  to be done at 1330  . TONSILLECTOMY      There were no vitals filed for this visit.  Subjective Assessment - 09/26/17 1537    Subjective  Pt states he is doing well today.  No complaints.    Currently in Pain?  No/denies                       [facility] Adult PT Treatment/Exercise - 09/26/17 0001      Ambulation/Gait   Ambulation Distance (Feet)  226 Feet    Assistive device  Straight cane    Gait Comments  mod cueing for sequence with SPC, taking larger steps      Knee/Hip Exercises: Stretches   Gastroc Stretch  3 reps;30 seconds;Limitations    Gastroc Stretch Limitations  slant board      Knee/Hip Exercises: Standing   Hip Abduction  Both;Knee straight;15 reps    Abduction Limitations  cueing for posture and form    Lateral Step Up  Both;10 reps;Hand Hold: 1;Step Height: 4"    Forward Step  Up  Both;Hand Hold: 1;Step Height: 4";15 reps    Functional Squat  15 reps    Rocker Board  2 minutes;Limitations    Rocker Board Limitations  R/L and A/P               PT Short Term Goals - 09/12/17 1526      PT SHORT TERM GOAL #1   Title  Patient will regularly perform and be independent with initial HEP.    Time  3    Period  Weeks    Status  New    Target Date  10/03/17      PT SHORT TERM GOAL #2   Title  Patient be able to balance on either LE for >10 seconds to indicate improved strength, balance and stability and decreased risk for falls.     Baseline  initial - Rt 2 sec; Lt 3 sec    Time  3    Period  Weeks    Status  New      PT SHORT TERM GOAL #3   Title  Patient will increased gait veleocity to >0.6 m/s to indicate decreasd risk for falls and improved ability to be a safe  limited community ambulator.     Baseline  initial - 0.42 m/s      PT SHORT TERM GOAL #4   Title  Patient will improve his TUG time to <25 sec to indicate decreased risk for falls and improved dynamic stability.     Baseline  initial - 31.13 sec    Time  3    Period  Weeks    Status  New        PT Long Term Goals - 09/12/17 1532      PT LONG TERM GOAL #1   Title  Patient will regularly perform and be independent with advanced HEP.    Time  6    Period  Weeks    Status  New    Target Date  10/24/17      PT LONG TERM GOAL #2   Title  Patient be able to balance on either LE for >20 seconds to indicate improved strength, balance and stability and decreased risk for falls.     Baseline  initial - Rt 2 sec; Lt 3 sec    Time  6    Period  Weeks    Status  New      PT LONG TERM GOAL #3   Title  Patient will increased gait veleocity to >0.8 m/s to indicate decreased risk for falls and improved ability to be a safe community ambulator.     Baseline  initial - 0.42 m/s    Time  6    Period  Weeks    Status  New      PT LONG TERM GOAL #4   Title  Patient will improve his TUG time to <20 sec to indicate decreased risk for falls and improved dynamic stability.     Baseline  initial - 31.13 sec    Time  6    Period  Weeks    Status  New            Plan - 09/26/17 1738    Clinical Impression Statement  continued with focus on balance, gait and strengthening.  Able to increase reps today of most exercises. Added lateral step ups with cues for form.  Improved sequencing with SPC gait and less cues.  Only required  one short seated break today.      Rehab Potential  Good    Clinical Impairments Affecting Rehab Potential  negative - visual impairment, h/o strokes; positive - positive attitiude, willingness to put in the work    PT Frequency  2x / week    PT Duration  6 weeks    PT Treatment/Interventions  ADLs/Self Care Home Management;Aquatic Therapy;Gait training;Stair  training;Functional mobility training;Therapeutic activities;Therapeutic exercise;Balance training;Patient/family education;Neuromuscular re-education;Manual techniques;Passive range of motion;Dry needling;Energy conservation    PT Next Visit Plan  Continue focus iwht LE strengthening, balance training and sequencing with SPC.  Next session begin vector stance to improve SLS ability.     PT Home Exercise Plan  6/18: bridges, s/l clam and abd, STS without HHA       Patient will benefit from skilled therapeutic intervention in order to improve the following deficits and impairments:  Abnormal gait, Decreased cognition, Decreased coordination, Impaired tone, Decreased activity tolerance, Decreased strength, Decreased balance, Difficulty walking  Visit Diagnosis: Unsteadiness on feet  Other abnormalities of gait and mobility  Muscle weakness (generalized)  Ataxic gait     Problem List Patient Active Problem List   Diagnosis Date Noted  . SIRS (systemic inflammatory response syndrome) ([place]) 08/17/2017  . Aortic stenosis 04/27/2014  . PAF (paroxysmal atrial fibrillation) ([place]) 09/15/2013  . Anticoagulation adequate 09/15/2013  . Febrile illness, acute 10/26/2011  . Cough 10/26/2011  . DM type 2 (diabetes mellitus, type 2) ([place]) 05/15/2011  . Hyperlipemia 05/15/2011  . Obesity 05/15/2011  . Benign hypertension 05/15/2011  . H/O: stroke 05/15/2011   Teena Irani, PTA/CLT [phone]  Teena Irani 09/26/2017, 5:40 PM  [place], [place], [address] Phone: [phone]   Fax:  [phone]  Name: [name] MRN: [id number] Date of Birth: September 01, 1940

## 2017-09-27 ENCOUNTER — Ambulatory Visit: Payer: PPO | Admitting: Podiatry

## 2017-09-30 ENCOUNTER — Ambulatory Visit: Payer: PPO | Admitting: Podiatry

## 2017-10-01 ENCOUNTER — Ambulatory Visit (HOSPITAL_COMMUNITY): Payer: PPO | Admitting: Physical Therapy

## 2017-10-04 ENCOUNTER — Ambulatory Visit (HOSPITAL_COMMUNITY): Payer: PPO | Admitting: Physical Therapy

## 2017-10-08 ENCOUNTER — Ambulatory Visit (HOSPITAL_COMMUNITY): Payer: PPO | Attending: Endocrinology

## 2017-10-08 ENCOUNTER — Encounter (HOSPITAL_COMMUNITY): Payer: Self-pay

## 2017-10-08 DIAGNOSIS — R2681 Unsteadiness on feet: Secondary | ICD-10-CM | POA: Insufficient documentation

## 2017-10-08 DIAGNOSIS — R2689 Other abnormalities of gait and mobility: Secondary | ICD-10-CM | POA: Insufficient documentation

## 2017-10-08 DIAGNOSIS — M6281 Muscle weakness (generalized): Secondary | ICD-10-CM

## 2017-10-08 DIAGNOSIS — R260 Ataxic gait: Secondary | ICD-10-CM | POA: Insufficient documentation

## 2017-10-08 NOTE — Therapy (Signed)
[place], [place], [address] Phone: [phone]   Fax:  [phone]  Physical Therapy Treatment  Patient Details  Name: [name] MRN: [id number] Date of Birth: Jan 14, 1941 Referring Provider: Roque Cash, Toy Baker, NP   Encounter Date: 10/08/2017  PT End of Session - 10/08/17 1453    Visit Number  6    Number of Visits  12    Date for PT Re-Evaluation  10/24/17 Minireassess 10/10/17    Authorization Type  [employer] - co-pay $15.00, No visit limits; no auth required    Authorization Time Period  6/13-->10/24/17    Authorization - Visit Number  6    Authorization - Number of Visits  10    PT Start Time  6803    PT Stop Time  1520    PT Time Calculation (min)  43 min    Equipment Utilized During Treatment  Gait belt    Activity Tolerance  Patient tolerated treatment well;Patient limited by fatigue    Behavior During Therapy  [facility] for tasks assessed/performed       Past Medical History:  Diagnosis Date  . Atrial fibrillation ([place])   . Cholelithiasis 10/2011   Per CT. No cholecystitis radiographically.  . 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 Depression   . Diabetes mellitus   . GERD (gastroesophageal reflux disease)   . Hemorrhoids 2008  . Hyperlipidemia   . Hypertension   . Obesity   . [facility] spotted fever) 10/26/2011   IgG positive.  . Shortness of breath    exertion  . Sleep apnea    cpap  . Stroke [facility]) 2010/2013   Right Brain stroke    Past Surgical History:  Procedure Laterality Date  . CARDIOVERSION  07/12/2011   Procedure: CARDIOVERSION;  Surgeon: Pixie Casino, MD;  Location: [facility] ENDOSCOPY;  Service: Cardiovascular;  Laterality: N/A;  . CAROTID DOPPLER STUDY  01/31/2012   no significant extracranial carotid artery stenosis  . COLONOSCOPY    . DOPPLER ECHOCARDIOGRAPHY  01/31/2012   EF 55-60%, moderately  calcified annulus of the mitral valve, left atrium demonstrated mild-moderately dilated  . KNEE SURGERY    . TEE WITHOUT CARDIOVERSION  07/12/2011   Procedure: TRANSESOPHAGEAL ECHOCARDIOGRAM (TEE);  Surgeon: Pixie Casino, MD;  Location: [facility] ENDOSCOPY;  Service: Cardiovascular;  Laterality: N/A;  to be done at 1330  . TONSILLECTOMY      There were no vitals filed for this visit.  Subjective Assessment - 10/08/17 1449    Subjective  Pt stated he is doing well, no reports of pain today.  Reports compliance with HEP every other day.  Stated main difficulty currently with walking long duration, currently able to walk for around 15 minutes prior fatigue.      Pertinent History  DM, 2 strokes, decreased/absent peripheral vision, HTN, LBP, incontinent at times.    Patient Stated Goals  go for walks, be able to help more around the house and yard to decrease load on wife, get stronger    Currently in Pain?  No/denies                       [facility] Adult PT Treatment/Exercise - 10/08/17 0001      Knee/Hip Exercises: Standing   Heel Raises  20 reps    Heel Raises Limitations  toe raises 20 incline slope  Lateral Step Up  Both;10 reps;Hand Hold: 1;Step Height: 4"    Forward Step Up  Both;Hand Hold: 1;Step Height: 4";15 reps    Functional Squat  15 reps    Rocker Board  2 minutes;Limitations    Rocker Board Limitations  R/L and A/P    SLS  Bil 3x each with max 2-3" BLE    SLS with Vectors  5x 5" with Bil HHA    Gait Training  2x 226 focusing on sequencing with SPC    Other Standing Knee Exercises  sidestep 2RT with RTB inside // bars (holding onto therapist hands)               PT Short Term Goals - 09/12/17 1526      PT SHORT TERM GOAL #1   Title  Patient will regularly perform and be independent with initial HEP.    Time  3    Period  Weeks    Status  New    Target Date  10/03/17      PT SHORT TERM GOAL #2   Title  Patient be able to balance on either LE for  >10 seconds to indicate improved strength, balance and stability and decreased risk for falls.     Baseline  initial - Rt 2 sec; Lt 3 sec    Time  3    Period  Weeks    Status  New      PT SHORT TERM GOAL #3   Title  Patient will increased gait veleocity to >0.6 m/s to indicate decreasd risk for falls and improved ability to be a safe limited community ambulator.     Baseline  initial - 0.42 m/s      PT SHORT TERM GOAL #4   Title  Patient will improve his TUG time to <25 sec to indicate decreased risk for falls and improved dynamic stability.     Baseline  initial - 31.13 sec    Time  3    Period  Weeks    Status  New        PT Long Term Goals - 09/12/17 1532      PT LONG TERM GOAL #1   Title  Patient will regularly perform and be independent with advanced HEP.    Time  6    Period  Weeks    Status  New    Target Date  10/24/17      PT LONG TERM GOAL #2   Title  Patient be able to balance on either LE for >20 seconds to indicate improved strength, balance and stability and decreased risk for falls.     Baseline  initial - Rt 2 sec; Lt 3 sec    Time  6    Period  Weeks    Status  New      PT LONG TERM GOAL #3   Title  Patient will increased gait veleocity to >0.8 m/s to indicate decreased risk for falls and improved ability to be a safe community ambulator.     Baseline  initial - 0.42 m/s    Time  6    Period  Weeks    Status  New      PT LONG TERM GOAL #4   Title  Patient will improve his TUG time to <20 sec to indicate decreased risk for falls and improved dynamic stability.     Baseline  initial - 31.13 sec    Time  6  Period  Weeks    Status  New            Plan - 10/08/17 1609    Clinical Impression Statement  Session focus on gait training, balance and strengtheing.  Pt continues to have difficulty with sequencing with SPC and increasing Rt LE stride length.  Pt able to verbalize and demonstrate when walking slowly but loses sequence with increased  cadence.  Pt continues to have difficulty with SLS activities.  Added vector stance with HHA required for hip strengthening to assist as well as side stepping to improve SLS with gait.  Pt limited by fatigue with a few seated rest breaks required, no reorts of pain through session.      Rehab Potential  Good    Clinical Impairments Affecting Rehab Potential  negative - visual impairment, h/o strokes; positive - positive attitiude, willingness to put in the work    PT Frequency  2x / week    PT Duration  6 weeks    PT Treatment/Interventions  ADLs/Self Care Home Management;Aquatic Therapy;Gait training;Stair training;Functional mobility training;Therapeutic activities;Therapeutic exercise;Balance training;Patient/family education;Neuromuscular re-education;Manual techniques;Passive range of motion;Dry needling;Energy conservation    PT Next Visit Plan  Begin ladder sequencing wiht SPC to improve stride length and sequencing.  Continue focus iwht strengthening, balance and sequencing wiht SPC.  Continue with vector stance and sidestep.    PT Home Exercise Plan  6/18: bridges, s/l clam and abd, STS without HHA       Patient will benefit from skilled therapeutic intervention in order to improve the following deficits and impairments:  Abnormal gait, Decreased cognition, Decreased coordination, Impaired tone, Decreased activity tolerance, Decreased strength, Decreased balance, Difficulty walking  Visit Diagnosis: Unsteadiness on feet  Other abnormalities of gait and mobility  Muscle weakness (generalized)  Ataxic gait     Problem List Patient Active Problem List   Diagnosis Date Noted  . SIRS (systemic inflammatory response syndrome) ([place]) 08/17/2017  . Aortic stenosis 04/27/2014  . PAF (paroxysmal atrial fibrillation) ([place]) 09/15/2013  . Anticoagulation adequate 09/15/2013  . Febrile illness, acute 10/26/2011  . Cough 10/26/2011  . DM type 2 (diabetes mellitus, type 2) ([place]) 05/15/2011   . Hyperlipemia 05/15/2011  . Obesity 05/15/2011  . Benign hypertension 05/15/2011  . H/O: stroke 05/15/2011   Ihor Austin, [place]; [place]  Aldona Lento 10/08/2017, 5:55 PM  [place], [place], [address] Phone: [phone]   Fax:  [phone]  Name: MAKAVELI HOARD MRN: [id number] Date of Birth: Mar 15, 1941

## 2017-10-08 NOTE — Patient Instructions (Signed)
Toe / Heel Raise (Standing)    Standing with support, raise heels, then rock back on heels and raise toes. Repeat 20  times.  Copyright  VHI. All rights reserved.   

## 2017-10-10 ENCOUNTER — Ambulatory Visit (HOSPITAL_COMMUNITY): Payer: PPO

## 2017-10-10 ENCOUNTER — Encounter (HOSPITAL_COMMUNITY): Payer: Self-pay

## 2017-10-10 DIAGNOSIS — M6281 Muscle weakness (generalized): Secondary | ICD-10-CM

## 2017-10-10 DIAGNOSIS — R2689 Other abnormalities of gait and mobility: Secondary | ICD-10-CM

## 2017-10-10 DIAGNOSIS — R2681 Unsteadiness on feet: Secondary | ICD-10-CM | POA: Diagnosis not present

## 2017-10-10 DIAGNOSIS — R260 Ataxic gait: Secondary | ICD-10-CM

## 2017-10-10 NOTE — Therapy (Addendum)
[place], [place], [address] Phone: [phone]   Fax:  [phone]  Physical Therapy Treatment  Patient Details  Name: [name] MRN: [id number] Date of Birth: 1940/10/23 Referring Provider: Roque Cash, Toy Baker, NP   Progress Note Reporting Period 09/12/17  to 10/10/2017  See note below for Objective Data and Assessment of Progress/Goals.       Encounter Date: 10/10/2017  PT End of Session - 10/10/17 1515    Visit Number  7    Number of Visits  12    Date for PT Re-Evaluation  10/24/17 Minireassess complete 10/10/17    Authorization Type  [employer] - co-pay $15.00, No visit limits; no auth required; Reassess to be completed on visit 17    Authorization Time Period  6/13-->10/24/17    Authorization - Visit Number  7    Authorization - Number of Visits  12   PT Start Time  6301    PT Stop Time  1512    PT Time Calculation (min)  39 min    Equipment Utilized During Treatment  Gait belt    Activity Tolerance  Patient tolerated treatment well;Patient limited by fatigue    Behavior During Therapy  WFL for tasks assessed/performed       Past Medical History:  Diagnosis Date  . Atrial fibrillation ([place])   . Cholelithiasis 10/2011   Per CT. No cholecystitis radiographically.  . 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 Depression   . Diabetes mellitus   . GERD (gastroesophageal reflux disease)   . Hemorrhoids 2008  . Hyperlipidemia   . Hypertension   . Obesity   . 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  . Shortness of breath    exertion  . Sleep apnea    cpap  . Stroke [facility]) 2010/2013   Right Brain stroke    Past Surgical History:  Procedure Laterality Date  . CARDIOVERSION  07/12/2011   Procedure: CARDIOVERSION;  Surgeon: Pixie Casino, MD;  Location: [facility] ENDOSCOPY;  Service: Cardiovascular;  Laterality:  N/A;  . CAROTID DOPPLER STUDY  01/31/2012   no significant extracranial carotid artery stenosis  . COLONOSCOPY    . DOPPLER ECHOCARDIOGRAPHY  01/31/2012   EF 55-60%, moderately calcified annulus of the mitral valve, left atrium demonstrated mild-moderately dilated  . KNEE SURGERY    . TEE WITHOUT CARDIOVERSION  07/12/2011   Procedure: TRANSESOPHAGEAL ECHOCARDIOGRAM (TEE);  Surgeon: Pixie Casino, MD;  Location: [facility] ENDOSCOPY;  Service: Cardiovascular;  Laterality: N/A;  to be done at 1330  . TONSILLECTOMY      There were no vitals filed for this visit.  Subjective Assessment - 10/10/17 1441    Subjective  Pt reports "his get up and go isn't here today."  Reports feeling okay just tired today.      Patient Stated Goals  go for walks, be able to help more around the house and yard to decrease load on wife, get stronger    Currently in Pain?  No/denies         [facility] PT Assessment - 10/10/17 0001      Assessment   Medical Diagnosis  stroke, immobility, gait, imbalance, deconditioning    Referring Provider  Roque Cash, Toy Baker, NP    Onset Date/Surgical Date  08/28/17    Hand Dominance  Right    Next MD Visit  sometime in  later July    Prior Therapy  Yes after 1st stroke      Precautions   Precautions  None      Observation/Other Assessments   Focus on Therapeutic Outcomes (FOTO)   -- was 44% limited      Strength   Right Hip Flexion  5/5 was 4/5    Right Hip Extension  4-/5 was 4-/5    Right Hip ABduction  4/5 was 4-/5     Left Hip Flexion  5/5 was 4/5    Left Hip Extension  4-/5 was 3+/5    Left Hip ABduction  4/5 was 4-/5    Right Knee Flexion  4+/5 was 4/5    Right Knee Extension  5/5 was 4+/5    Left Knee Flexion  4+/5 was 4-/5    Left Knee Extension  5/5 was 4+/5    Right Ankle Dorsiflexion  4/5 was 4/5    Left Ankle Dorsiflexion  4-/5 was 4-/5      Static Standing Balance   Static Standing - Balance Support  No upper extremity supported    Static  Standing - Level of Assistance  5: Stand by assistance    Static Standing Balance -  Activities   Single Leg Stance - Right Leg;Single Leg Stance - Left Leg    Static Standing - Comment/# of Minutes  Bil 2-3": max of 5      Standardized Balance Assessment   Standardized Balance Assessment  Timed Up and Go Test      Timed Up and Go Test   Normal TUG (seconds)  26.06 was 31.13                   [facility] Adult PT Treatment/Exercise - 10/10/17 0001      Transfers   Five time sit to stand comments   22.28 was 24.01      Ambulation/Gait   Ambulation Distance (Feet)  252 Feet 3MWT    Gait Comments  mod cueing for sequence with SPC, taking larger steps      Knee/Hip Exercises: Standing   Heel Raises  20 reps    Heel Raises Limitations  toe raises 20 incline slope    Functional Squat  15 reps    Rocker Board  2 minutes;Limitations    Rocker Board Limitations  R/L and A/P    SLS  Bil 3x each with max 2-3" BLE    Other Standing Knee Exercises  ladder sequencing wiht SPC 2RT               PT Short Term Goals - 10/10/17 1826      PT SHORT TERM GOAL #1   Title  Patient will regularly perform and be independent with initial HEP.    Baseline  10/10/17:  Reports compliance with some of the exercises every other day.    Time  3    Period  Weeks    Status  Partially Met      PT SHORT TERM GOAL #2   Title  Patient be able to balance on either LE for >10 seconds to indicate improved strength, balance and stability and decreased risk for falls.     Baseline  initial - Rt 2 sec; Lt 3 sec; 10/10/2017: BLE 2-3" max    Time  3    Period  Weeks      PT SHORT TERM GOAL #3   Title  Patient will increased gait veleocity to >0.6  m/s to indicate decreasd risk for falls and improved ability to be a safe limited community ambulator.     Baseline  initial - 0.42 m/s; 10/10/2017: 3MWT 238f.  [id number]= [id number]  Status  On-going      PT SHORT TERM GOAL #4   Title  Patient will  improve his TUG time to <25 sec to indicate decreased risk for falls and improved dynamic stability.     Baseline  initial - 31.13 sec; 10/10/2017: 26.06"    Status  On-going        PT Long Term Goals - 10/10/17 1828      PT LONG TERM GOAL #1   Title  Patient will regularly perform and be independent with advanced HEP.    Time  6    Period  Weeks    Status  Not Met      PT LONG TERM GOAL #2   Title  Patient be able to balance on either LE for >20 seconds to indicate improved strength, balance and stability and decreased risk for falls.     Baseline  initial - Rt 2 sec; Lt 3 sec; 10/10/2017: BLE 2-3" max    Status  Not Met      PT LONG TERM GOAL #3   Title  Patient will increased gait veleocity to >0.8 m/s to indicate decreased risk for falls and improved ability to be a safe community ambulator.     Status  Not Met      PT LONG TERM GOAL #4   Title  Patient will improve his TUG time to <20 sec to indicate decreased risk for falls and improved dynamic stability.     Baseline  initial - 31.13 sec; 10/10/2017 26.06"    Status  On-going            Plan - 10/10/17 1832    Clinical Impression Statement  Reviewed goals this session.  Pt making some functional gains with increased 5STS, TUG and small increased distance with 3MWT.  Pt reports partial compliance wiht HEP every other day.  Pt continues to have deficits with strengthening, sequencing and overall gait mechanics and balance.  This session added ladder sequencing to improve stride length and sequencing with SPC, moderate cueing- continue exercise next session.      Rehab Potential  Good    Clinical Impairments Affecting Rehab Potential  negative - visual impairment, h/o strokes; positive - positive attitiude, willingness to put in the work    PT Frequency  2x / week    PT Duration  6 weeks    PT Treatment/Interventions  ADLs/Self Care Home Management;Aquatic Therapy;Gait training;Stair training;Functional mobility  training;Therapeutic activities;Therapeutic exercise;Balance training;Patient/family education;Neuromuscular re-education;Manual techniques;Passive range of motion;Dry needling;Energy conservation    PT Next Visit Plan  Begin ladder sequencing wiht SPC to improve stride length and sequencing.  Increase focus with balance next session.  Continue focus iwht strengthening, balance and sequencing wiht SPC.  Continue with vector stance and sidestep.    PT Home Exercise Plan  6/18: bridges, s/l clam and abd, STS without HHA       Patient will benefit from skilled therapeutic intervention in order to improve the following deficits and impairments:  Abnormal gait, Decreased cognition, Decreased coordination, Impaired tone, Decreased activity tolerance, Decreased strength, Decreased balance, Difficulty walking  Visit Diagnosis: Unsteadiness on feet  Other abnormalities of gait and mobility  Muscle weakness (generalized)  Ataxic gait     Problem List Patient Active Problem  List   Diagnosis Date Noted  . SIRS (systemic inflammatory response syndrome) ([place]) 08/17/2017  . Aortic stenosis 04/27/2014  . PAF (paroxysmal atrial fibrillation) ([place]) 09/15/2013  . Anticoagulation adequate 09/15/2013  . Febrile illness, acute 10/26/2011  . Cough 10/26/2011  . DM type 2 (diabetes mellitus, type 2) ([place]) 05/15/2011  . Hyperlipemia 05/15/2011  . Obesity 05/15/2011  . Benign hypertension 05/15/2011  . H/O: stroke 05/15/2011   Ihor Austin, [place]; [place]  Rayetta Humphrey, PT CLT [phone] 10/10/2017, 6:36 PM  [facility] [address] [place], [place], [address] Phone: [phone]   Fax:  [phone]  Name: [name] MRN: [id number] Date of Birth: Jul 04, 1940

## 2017-10-15 ENCOUNTER — Ambulatory Visit (HOSPITAL_COMMUNITY): Payer: PPO | Admitting: Physical Therapy

## 2017-10-15 ENCOUNTER — Encounter (HOSPITAL_COMMUNITY): Payer: Self-pay | Admitting: Physical Therapy

## 2017-10-15 ENCOUNTER — Other Ambulatory Visit: Payer: Self-pay

## 2017-10-15 DIAGNOSIS — R2689 Other abnormalities of gait and mobility: Secondary | ICD-10-CM

## 2017-10-15 DIAGNOSIS — R2681 Unsteadiness on feet: Secondary | ICD-10-CM | POA: Diagnosis not present

## 2017-10-15 DIAGNOSIS — M6281 Muscle weakness (generalized): Secondary | ICD-10-CM

## 2017-10-15 DIAGNOSIS — R260 Ataxic gait: Secondary | ICD-10-CM

## 2017-10-15 NOTE — Therapy (Signed)
[place], [place], [address] Phone: [phone]   Fax:  [phone]  Physical Therapy Treatment  Patient Details  Name: [name] MRN: [id number] Date of Birth: 1940/07/26 Referring Provider: Roque Cash, Toy Baker, NP   Encounter Date: 10/15/2017  PT End of Session - 10/15/17 1507    Visit Number  8    Number of Visits  12    Date for PT Re-Evaluation  10/24/17 Minireassess complete 10/10/17    Authorization Type  [employer] - co-pay $15.00, No visit limits; no auth required    Authorization Time Period  6/13-->10/24/17    Authorization - Visit Number  8    Authorization - Number of Visits  10    PT Start Time  5701    PT Stop Time  1512    PT Time Calculation (min)  40 min    Equipment Utilized During Treatment  Gait belt    Activity Tolerance  Patient tolerated treatment well;Patient limited by fatigue    Behavior During Therapy  [facility] for tasks assessed/performed       Past Medical History:  Diagnosis Date  . Atrial fibrillation ([place])   . Cholelithiasis 10/2011   Per CT. No cholecystitis radiographically.  . 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 Depression   . Diabetes mellitus   . GERD (gastroesophageal reflux disease)   . Hemorrhoids 2008  . Hyperlipidemia   . Hypertension   . Obesity   . 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  . Shortness of breath    exertion  . Sleep apnea    cpap  . Stroke [facility]) 2010/2013   Right Brain stroke    Past Surgical History:  Procedure Laterality Date  . CARDIOVERSION  07/12/2011   Procedure: CARDIOVERSION;  Surgeon: Pixie Casino, MD;  Location: [facility] ENDOSCOPY;  Service: Cardiovascular;  Laterality: N/A;  . CAROTID DOPPLER STUDY  01/31/2012   no significant extracranial carotid artery stenosis  . COLONOSCOPY    . DOPPLER ECHOCARDIOGRAPHY  01/31/2012   EF 55-60%,  moderately calcified annulus of the mitral valve, left atrium demonstrated mild-moderately dilated  . KNEE SURGERY    . TEE WITHOUT CARDIOVERSION  07/12/2011   Procedure: TRANSESOPHAGEAL ECHOCARDIOGRAM (TEE);  Surgeon: Pixie Casino, MD;  Location: [facility] ENDOSCOPY;  Service: Cardiovascular;  Laterality: N/A;  to be done at 1330  . TONSILLECTOMY      There were no vitals filed for this visit.  Subjective Assessment - 10/15/17 1437    Subjective  Pt states that he is doing his exercises about every other day     Patient Stated Goals  go for walks, be able to help more around the house and yard to decrease load on wife, get stronger    Currently in Pain?  No/denies                       [facility] Adult PT Treatment/Exercise - 10/15/17 0001      Ambulation/Gait   Ambulation Distance (Feet)  226 Feet    Assistive device  Straight cane    Gait Comments  mod cueing for sequence with SPC, taking larger steps      Exercises   Exercises  Lumbar      Lumbar Exercises: Standing   Other Standing Lumbar Exercises  shifting wt with rocking x 10; NBS with head turns  x 10 wall arch x 10; opposite arm leg raise x 10 ; walk from wall to mat in 7 steps x 4 ;    Other Standing Lumbar Exercises  tandem stance B x 2       Lumbar Exercises: Seated   Sit to Stand  10 reps               PT Short Term Goals - 10/10/17 1826      PT SHORT TERM GOAL #1   Title  Patient will regularly perform and be independent with initial HEP.    Baseline  10/10/17:  Reports compliance with some of the exercises every other day.    Time  3    Period  Weeks    Status  Partially Met      PT SHORT TERM GOAL #2   Title  Patient be able to balance on either LE for >10 seconds to indicate improved strength, balance and stability and decreased risk for falls.     Baseline  initial - Rt 2 sec; Lt 3 sec; 10/10/2017: BLE 2-3" max    Time  3    Period  Weeks      PT SHORT TERM GOAL #3   Title  Patient will  increased gait veleocity to >0.6 m/s to indicate decreasd risk for falls and improved ability to be a safe limited community ambulator.     Baseline  initial - 0.42 m/s; 10/10/2017: 3MWT 248f.  [id number]= .[id number]  Status  On-going      PT SHORT TERM GOAL #4   Title  Patient will improve his TUG time to <25 sec to indicate decreased risk for falls and improved dynamic stability.     Baseline  initial - 31.13 sec; 10/10/2017: 26.06"    Status  On-going        PT Long Term Goals - 10/10/17 1828      PT LONG TERM GOAL #1   Title  Patient will regularly perform and be independent with advanced HEP.    Time  6    Period  Weeks    Status  Not Met      PT LONG TERM GOAL #2   Title  Patient be able to balance on either LE for >20 seconds to indicate improved strength, balance and stability and decreased risk for falls.     Baseline  initial - Rt 2 sec; Lt 3 sec; 10/10/2017: BLE 2-3" max    Status  Not Met      PT LONG TERM GOAL #3   Title  Patient will increased gait veleocity to >0.8 m/s to indicate decreased risk for falls and improved ability to be a safe community ambulator.     Status  Not Met      PT LONG TERM GOAL #4   Title  Patient will improve his TUG time to <20 sec to indicate decreased risk for falls and improved dynamic stability.     Baseline  initial - 31.13 sec; 10/10/2017 26.06"    Status  On-going            Plan - 10/15/17 1508    Clinical Impression Statement  Added multiple exercises to pt to attempt pt to get longer steps with increased arm swing.  PT needed multiple verbal and manual cuing to complete activities properly     Rehab Potential  Good    Clinical Impairments Affecting Rehab Potential  negative - visual impairment,  h/o strokes; positive - positive attitiude, willingness to put in the work    PT Frequency  2x / week    PT Duration  6 weeks    PT Treatment/Interventions  ADLs/Self Care Home Management;Aquatic Therapy;Gait training;Stair  training;Functional mobility training;Therapeutic activities;Therapeutic exercise;Balance training;Patient/family education;Neuromuscular re-education;Manual techniques;Passive range of motion;Dry needling;Energy conservation    PT Next Visit Plan  Begin ladder sequencing wiht SPC to improve stride length and sequencing.  Increase focus with balance next session.  Continue focus iwht strengthening, balance and sequencing wiht SPC.  Continue with vector stance and sidestep.    PT Home Exercise Plan  6/18: bridges, s/l clam and abd, STS without HHA       Patient will benefit from skilled therapeutic intervention in order to improve the following deficits and impairments:  Abnormal gait, Decreased cognition, Decreased coordination, Impaired tone, Decreased activity tolerance, Decreased strength, Decreased balance, Difficulty walking  Visit Diagnosis: Unsteadiness on feet  Other abnormalities of gait and mobility  Muscle weakness (generalized)  Ataxic gait     Problem List Patient Active Problem List   Diagnosis Date Noted  . SIRS (systemic inflammatory response syndrome) ([place]) 08/17/2017  . Aortic stenosis 04/27/2014  . PAF (paroxysmal atrial fibrillation) ([place]) 09/15/2013  . Anticoagulation adequate 09/15/2013  . Febrile illness, acute 10/26/2011  . Cough 10/26/2011  . DM type 2 (diabetes mellitus, type 2) ([place]) 05/15/2011  . Hyperlipemia 05/15/2011  . Obesity 05/15/2011  . Benign hypertension 05/15/2011  . H/O: stroke 05/15/2011    Rayetta Humphrey, PT CLT [phone] 10/15/2017, 3:16 PM  [facility] [address] [place], [place], [address] Phone: [phone]   Fax:  [phone]  Name: [name] MRN: [id number] Date of Birth: 12-12-1940

## 2017-10-17 ENCOUNTER — Telehealth (HOSPITAL_COMMUNITY): Payer: Self-pay | Admitting: Physical Therapy

## 2017-10-17 ENCOUNTER — Ambulatory Visit (HOSPITAL_COMMUNITY): Payer: PPO | Admitting: Physical Therapy

## 2017-10-17 NOTE — Telephone Encounter (Signed)
Patient is having problems with his blood sugar and will not be coming today

## 2017-10-21 ENCOUNTER — Encounter (HOSPITAL_COMMUNITY): Payer: Self-pay | Admitting: Physical Therapy

## 2017-10-21 ENCOUNTER — Other Ambulatory Visit: Payer: Self-pay

## 2017-10-21 ENCOUNTER — Ambulatory Visit (HOSPITAL_COMMUNITY): Payer: PPO | Admitting: Physical Therapy

## 2017-10-21 DIAGNOSIS — M6281 Muscle weakness (generalized): Secondary | ICD-10-CM

## 2017-10-21 DIAGNOSIS — R2681 Unsteadiness on feet: Secondary | ICD-10-CM | POA: Diagnosis not present

## 2017-10-21 DIAGNOSIS — R260 Ataxic gait: Secondary | ICD-10-CM

## 2017-10-21 DIAGNOSIS — R2689 Other abnormalities of gait and mobility: Secondary | ICD-10-CM

## 2017-10-21 NOTE — Therapy (Signed)
[place], [place], [address] Phone: [phone]   Fax:  [phone]  Physical Therapy Treatment  Patient Details  Name: [name] MRN: [id number] Date of Birth: 07-07-1940 Referring Provider: Roque Cash, Toy Baker, NP   Encounter Date: 10/21/2017  PT End of Session - 10/21/17 1603    Visit Number  9    Number of Visits  12    Date for PT Re-Evaluation  10/24/17 Minireassess complete 10/10/17    Authorization Type  [employer] - co-pay $15.00, No visit limits; no auth required    Authorization Time Period  6/13-->10/24/17    Authorization - Visit Number  9    Authorization - Number of Visits  10    PT Start Time  3903    PT Stop Time  1600    PT Time Calculation (min)  45 min    Equipment Utilized During Treatment  Gait belt    Activity Tolerance  Patient tolerated treatment well;Patient limited by fatigue    Behavior During Therapy  [facility] for tasks assessed/performed       Past Medical History:  Diagnosis Date  . Atrial fibrillation ([place])   . Cholelithiasis 10/2011   Per CT. No cholecystitis radiographically.  . 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 Depression   . Diabetes mellitus   . GERD (gastroesophageal reflux disease)   . Hemorrhoids 2008  . Hyperlipidemia   . Hypertension   . Obesity   . 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  . Shortness of breath    exertion  . Sleep apnea    cpap  . Stroke [facility]) 2010/2013   Right Brain stroke    Past Surgical History:  Procedure Laterality Date  . CARDIOVERSION  07/12/2011   Procedure: CARDIOVERSION;  Surgeon: Pixie Casino, MD;  Location: [facility] ENDOSCOPY;  Service: Cardiovascular;  Laterality: N/A;  . CAROTID DOPPLER STUDY  01/31/2012   no significant extracranial carotid artery stenosis  . COLONOSCOPY    . DOPPLER ECHOCARDIOGRAPHY  01/31/2012   EF 55-60%,  moderately calcified annulus of the mitral valve, left atrium demonstrated mild-moderately dilated  . KNEE SURGERY    . TEE WITHOUT CARDIOVERSION  07/12/2011   Procedure: TRANSESOPHAGEAL ECHOCARDIOGRAM (TEE);  Surgeon: Pixie Casino, MD;  Location: [facility] ENDOSCOPY;  Service: Cardiovascular;  Laterality: N/A;  to be done at 1330  . TONSILLECTOMY      There were no vitals filed for this visit.  Subjective Assessment - 10/21/17 1515    Subjective  PT states that his sugar has been low so he hasn't been doing his exercise as well as he should.     Pertinent History  DM, 2 strokes, decreased/absent peripheral vision, HTN, LBP, incontinent at times.    Patient Stated Goals  go for walks, be able to help more around the house and yard to decrease load on wife, get stronger    Currently in Pain?  No/denies                       [facility] Adult PT Treatment/Exercise - 10/21/17 0001      Ambulation/Gait   Ambulation Distance (Feet)  226 Feet    Assistive device  Straight cane    Gait Comments  mod cueing for sequence with SPC, taking larger steps      Exercises   Exercises  Lumbar  Lumbar Exercises: Standing   Other Standing Lumbar Exercises  shifting wt with rocking x 10;     Other Standing Lumbar Exercises  Step from wall to mat in 7 steps x 4; wall arch x 10; UE flexion with back to wall x 10       Lumbar Exercises: Seated   Sit to Stand  --          Balance Exercises - 10/21/17 1551      Balance Exercises: Standing   Standing Eyes Opened  Narrow base of support (BOS);Head turns;5 reps    Tandem Stance  Eyes open;4 reps;Limitations head turns  At corner for safety    Sidestepping  2 reps    Marching Limitations  x10    Heel Raises Limitations  x10    Toe Raise Limitations  x10    Sit to Stand Time  x10          PT Short Term Goals - 10/10/17 1826      PT SHORT TERM GOAL #1   Title  Patient will regularly perform and be independent with initial HEP.     Baseline  10/10/17:  Reports compliance with some of the exercises every other day.    Time  3    Period  Weeks    Status  Partially Met      PT SHORT TERM GOAL #2   Title  Patient be able to balance on either LE for >10 seconds to indicate improved strength, balance and stability and decreased risk for falls.     Baseline  initial - Rt 2 sec; Lt 3 sec; 10/10/2017: BLE 2-3" max    Time  3    Period  Weeks      PT SHORT TERM GOAL #3   Title  Patient will increased gait veleocity to >0.6 m/s to indicate decreasd risk for falls and improved ability to be a safe limited community ambulator.     Baseline  initial - 0.42 m/s; 10/10/2017: 3MWT 249f.  [id number]= .[id number]  Status  On-going      PT SHORT TERM GOAL #4   Title  Patient will improve his TUG time to <25 sec to indicate decreased risk for falls and improved dynamic stability.     Baseline  initial - 31.13 sec; 10/10/2017: 26.06"    Status  On-going        PT Long Term Goals - 10/10/17 1828      PT LONG TERM GOAL #1   Title  Patient will regularly perform and be independent with advanced HEP.    Time  6    Period  Weeks    Status  Not Met      PT LONG TERM GOAL #2   Title  Patient be able to balance on either LE for >20 seconds to indicate improved strength, balance and stability and decreased risk for falls.     Baseline  initial - Rt 2 sec; Lt 3 sec; 10/10/2017: BLE 2-3" max    Status  Not Met      PT LONG TERM GOAL #3   Title  Patient will increased gait veleocity to >0.8 m/s to indicate decreased risk for falls and improved ability to be a safe community ambulator.     Status  Not Met      PT LONG TERM GOAL #4   Title  Patient will improve his TUG time to <20 sec to indicate decreased risk for  falls and improved dynamic stability.     Baseline  initial - 31.13 sec; 10/10/2017 26.06"    Status  On-going            Plan - 10/21/17 1604    Clinical Impression Statement  Today session concentrated on reciprocal  arm swing with gait and balance.  Pt has noted improved gait at the end of treatment.     Rehab Potential  Good    Clinical Impairments Affecting Rehab Potential  negative - visual impairment, h/o strokes; positive - positive attitiude, willingness to put in the work    PT Frequency  2x / week    PT Duration  6 weeks    PT Treatment/Interventions  ADLs/Self Care Home Management;Aquatic Therapy;Gait training;Stair training;Functional mobility training;Therapeutic activities;Therapeutic exercise;Balance training;Patient/family education;Neuromuscular re-education;Manual techniques;Passive range of motion;Dry needling;Energy conservation    PT Next Visit Plan  Continue  focus with balance  and gait next session.      PT Home Exercise Plan  6/18: bridges, s/l clam and abd, STS without HHA       Patient will benefit from skilled therapeutic intervention in order to improve the following deficits and impairments:  Abnormal gait, Decreased cognition, Decreased coordination, Impaired tone, Decreased activity tolerance, Decreased strength, Decreased balance, Difficulty walking  Visit Diagnosis: Unsteadiness on feet  Other abnormalities of gait and mobility  Muscle weakness (generalized)  Ataxic gait     Problem List Patient Active Problem List   Diagnosis Date Noted  . SIRS (systemic inflammatory response syndrome) ([place]) 08/17/2017  . Aortic stenosis 04/27/2014  . PAF (paroxysmal atrial fibrillation) ([place]) 09/15/2013  . Anticoagulation adequate 09/15/2013  . Febrile illness, acute 10/26/2011  . Cough 10/26/2011  . DM type 2 (diabetes mellitus, type 2) ([place]) 05/15/2011  . Hyperlipemia 05/15/2011  . Obesity 05/15/2011  . Benign hypertension 05/15/2011  . H/O: stroke 05/15/2011  Rayetta Humphrey, PT CLT [phone] 10/21/2017, 4:07 PM  [place] [address] [place], [place], [address] Phone: [phone]   Fax:  [phone]  Name: [name] MRN: [id number] Date of Birth: [birth year]

## 2017-10-22 ENCOUNTER — Ambulatory Visit (HOSPITAL_COMMUNITY): Payer: PPO

## 2017-10-22 DIAGNOSIS — Z794 Long term (current) use of insulin: Secondary | ICD-10-CM | POA: Diagnosis not present

## 2017-10-22 DIAGNOSIS — I10 Essential (primary) hypertension: Secondary | ICD-10-CM | POA: Diagnosis not present

## 2017-10-22 DIAGNOSIS — E1159 Type 2 diabetes mellitus with other circulatory complications: Secondary | ICD-10-CM | POA: Diagnosis not present

## 2017-10-23 ENCOUNTER — Encounter (HOSPITAL_COMMUNITY): Payer: Self-pay | Admitting: Physical Therapy

## 2017-10-23 ENCOUNTER — Other Ambulatory Visit: Payer: Self-pay

## 2017-10-23 ENCOUNTER — Ambulatory Visit (HOSPITAL_COMMUNITY): Payer: PPO | Admitting: Physical Therapy

## 2017-10-23 DIAGNOSIS — M6281 Muscle weakness (generalized): Secondary | ICD-10-CM

## 2017-10-23 DIAGNOSIS — R2689 Other abnormalities of gait and mobility: Secondary | ICD-10-CM

## 2017-10-23 DIAGNOSIS — R260 Ataxic gait: Secondary | ICD-10-CM

## 2017-10-23 DIAGNOSIS — R2681 Unsteadiness on feet: Secondary | ICD-10-CM

## 2017-10-23 NOTE — Therapy (Signed)
[place], [place], [address] Phone: [phone]   Fax:  [phone]  Physical Therapy Treatment/Reassessment  Patient Details  Name: [name] MRN: [id number] Date of Birth: [birth year] Referring Provider: Roque Cash    Encounter Date: 10/23/2017   Reporting Period from 09/12/2017 to 10/23/2017  See note below for Objective Data and Assessment of Progress/Goals.      PT End of Session - 10/23/17 1559    Visit Number  10    Number of Visits  18    Date for PT Re-Evaluation  10/24/17 Minireassess complete 10/10/17    Authorization Type  [employer] - co-pay $15.00, No visit limits; no auth required    Authorization Time Period  10/23/2017-11/24/2017     Authorization - Visit Number  10    Authorization - Number of Visits  18    PT Start Time  4388    PT Stop Time  1600    PT Time Calculation (min)  45 min    Equipment Utilized During Treatment  Gait belt    Activity Tolerance  Patient tolerated treatment well;Patient limited by fatigue    Behavior During Therapy  WFL for tasks assessed/performed       Past Medical History:  Diagnosis Date  . Atrial fibrillation ([place])   . Cholelithiasis 10/2011   Per CT. No cholecystitis radiographically.  . 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 Depression   . Diabetes mellitus   . GERD (gastroesophageal reflux disease)   . Hemorrhoids 2008  . Hyperlipidemia   . Hypertension   . Obesity   . 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  . Shortness of breath    exertion  . Sleep apnea    cpap  . Stroke [facility]) 2010/2013   Right Brain stroke    Past Surgical History:  Procedure Laterality Date  . CARDIOVERSION  07/12/2011   Procedure: CARDIOVERSION;  Surgeon: Pixie Casino, MD;  Location: [facility] ENDOSCOPY;  Service: Cardiovascular;  Laterality: N/A;  . CAROTID DOPPLER STUDY  01/31/2012   no  significant extracranial carotid artery stenosis  . COLONOSCOPY    . DOPPLER ECHOCARDIOGRAPHY  01/31/2012   EF 55-60%, moderately calcified annulus of the mitral valve, left atrium demonstrated mild-moderately dilated  . KNEE SURGERY    . TEE WITHOUT CARDIOVERSION  07/12/2011   Procedure: TRANSESOPHAGEAL ECHOCARDIOGRAM (TEE);  Surgeon: Pixie Casino, MD;  Location: [facility] ENDOSCOPY;  Service: Cardiovascular;  Laterality: N/A;  to be done at 1330  . TONSILLECTOMY      There were no vitals filed for this visit.  Subjective Assessment - 10/23/17 1522    Subjective  Pt states that he is doing some of his exercises at home.     Pertinent History  DM, 2 strokes, decreased/absent peripheral vision, HTN, LBP, incontinent at times.    Patient Stated Goals  go for walks, be able to help more around the house and yard to decrease load on wife, get stronger         [facility] PT Assessment - 10/23/17 0001      Assessment   Medical Diagnosis  stroke, immobility, gait, imbalance, deconditioning    Referring Provider  Roque Cash     Onset Date/Surgical Date  08/28/17    Hand Dominance  Right    Next MD Visit  sometime in later July    Prior Therapy  Yes after 1st stroke      Precautions   Precautions  None      Restrictions   Weight Bearing Restrictions  No      Home Environment   Living Environment  Private residence    Living Arrangements  Spouse/significant other    [place] to enter    Entrance Stairs-Number of Steps  3    Entrance Stairs-Rails  Right    [place]  One level      Prior Function   Level of Independence  Requires assistive device for independence drove prior to first stroke but hasn't driven since.    Vocation  Retired    Leisure  work in yard, garden, go for walks      Cognition   Overall Cognitive Status  Impaired/Different from baseline      Observation/Other Assessments   Focus on Therapeutic Outcomes (FOTO)   50% was 44% limited      Strength    Right Hip Flexion  5/5 was 4/5     Right Hip Extension  3/5    Right Hip ABduction  5/5 was 4/5     Left Hip Flexion  5/5 was 4/5     Left Hip Extension  3+/5 was 3+/5     Left Hip ABduction  5/5 was 4/5    Right Knee Flexion  4+/5 was 4/5    Right Knee Extension  5/5 was 4+    Left Knee Flexion  5/5 was 4+    Left Knee Extension  5/5 was 4+    Right Ankle Dorsiflexion  5/5 was 4/5    Left Ankle Dorsiflexion  4/5 was 4-/5      Transfers   Five time sit to stand comments   18.03 was 24.01      Ambulation/Gait   Ambulation Distance (Feet)  286 Feet was 246 ft     Assistive device  None    Gait Pattern  Decreased arm swing - left;Decreased step length - right;Decreased stance time - left;Decreased stride length;Decreased hip/knee flexion - left;Decreased dorsiflexion - left;Decreased weight shift to left;Decreased trunk rotation;Poor foot clearance - left    Gait velocity  --    Gait Comments  3 minute walk test       Balance   Balance Assessed  Yes      Static Standing Balance   Static Standing - Balance Support  No upper extremity supported    Static Standing - Level of Assistance  5: Stand by assistance    Static Standing Balance -  Activities   Single Leg Stance - Right Leg;Single Leg Stance - Left Leg      Standardized Balance Assessment   Standardized Balance Assessment  Timed Up and Go Test      Timed Up and Go Test   Normal TUG (seconds)  21.58 initial 31.13                        Balance Exercises - 10/23/17 1558      Balance Exercises: Standing   Standing Eyes Opened  Narrow base of support (BOS);Head turns;5 reps    SLS  Eyes open;3 reps        PT Education - 10/23/17 1606    Education Details  updated HEP     Person(s) Educated  Patient    Methods  Explanation       PT Short Term Goals - 10/23/17  1550      PT SHORT TERM GOAL #1   Title  Patient will regularly perform and be independent with initial HEP.    Baseline  10/10/17:   Reports compliance with some of the exercises every other day.    Time  3    Period  Weeks    Status  Achieved      PT SHORT TERM GOAL #2   Title  Patient be able to balance on either LE for >10 seconds to indicate improved strength, balance and stability and decreased risk for falls.     Baseline  initial - Rt 2 sec; Lt 3 sec; 10/10/2017: BLE 2-3" max; B max 1-2 seconds     Time  3    Period  Weeks    Status  Not Met      PT SHORT TERM GOAL #3   Title  Patient will increased gait veleocity to >0.6 m/s to indicate decreasd risk for falls and improved ability to be a safe limited community ambulator.     Baseline  initial - 0.42 m/s; 10/10/2017: 3MWT 241f.  76.80953m80= .[id number] 256 ft     Status  On-going      PT SHORT TERM GOAL #4   Title  Patient will improve his TUG time to <25 sec to indicate decreased risk for falls and improved dynamic stability.     Baseline  initial - 31.13 sec; 10/10/2017: 26.06"7/28 =21.58    Status  Achieved        PT Long Term Goals - 10/23/17 1557      PT LONG TERM GOAL #1   Title  Patient will regularly perform and be independent with advanced HEP.    Time  6    Period  Weeks    Status  Not Met      PT LONG TERM GOAL #2   Title  Patient be able to balance on either LE for >20 seconds to indicate improved strength, balance and stability and decreased risk for falls.     Baseline  initial - Rt 2 sec; Lt 3 sec; 10/10/2017: BLE 2-3" max    Status  Not Met      PT LONG TERM GOAL #3   Title  Patient will increased gait veleocity to >0.8 m/s to indicate decreased risk for falls and improved ability to be a safe community ambulator.     Status  Not Met      PT LONG TERM GOAL #4   Title  Patient will improve his TUG time to <20 sec to indicate decreased risk for falls and improved dynamic stability.     Baseline  initial - 31.13 sec; 10/10/2017 26.06"; 10/23/2017 21     Status  On-going            Plan - 10/23/17 1601    Clinical Impression  Statement  PT reassessed today.  Pt has improved in his mm strength except for his glut maximus.  PT has not improved in his balance, however and will continue to benefit from skilled physical therapy concentrating on his limitations as outlined above.     Rehab Potential  Good    Clinical Impairments Affecting Rehab Potential  negative - visual impairment, h/o strokes; positive - positive attitiude, willingness to put in the work    PT Frequency  2x / week    PT Duration  Other (comment) 9 weeks ( continue for 4 more weeks )  PT Treatment/Interventions  ADLs/Self Care Home Management;Aquatic Therapy;Gait training;Stair training;Functional mobility training;Therapeutic activities;Therapeutic exercise;Balance training;Patient/family education;Neuromuscular re-education;Manual techniques;Passive range of motion;Dry needling;Energy conservation    PT Next Visit Plan  Continue  focus with balance  and gait next session.      PT Home Exercise Plan  6/18: bridges, s/l clam and abd, STS without HHA       Patient will benefit from skilled therapeutic intervention in order to improve the following deficits and impairments:  Abnormal gait, Decreased cognition, Decreased coordination, Impaired tone, Decreased activity tolerance, Decreased strength, Decreased balance, Difficulty walking  Visit Diagnosis: Unsteadiness on feet - Plan: PT plan of care cert/re-cert  Other abnormalities of gait and mobility - Plan: PT plan of care cert/re-cert  Muscle weakness (generalized) - Plan: PT plan of care cert/re-cert  Ataxic gait - Plan: PT plan of care cert/re-cert     Problem List Patient Active Problem List   Diagnosis Date Noted  . SIRS (systemic inflammatory response syndrome) ([place]) 08/17/2017  . Aortic stenosis 04/27/2014  . PAF (paroxysmal atrial fibrillation) ([place]) 09/15/2013  . Anticoagulation adequate 09/15/2013  . Febrile illness, acute 10/26/2011  . Cough 10/26/2011  . DM type 2 (diabetes  mellitus, type 2) ([place]) 05/15/2011  . Hyperlipemia 05/15/2011  . Obesity 05/15/2011  . Benign hypertension 05/15/2011  . H/O: stroke 05/15/2011    Rayetta Humphrey, PT CLT [phone] 10/23/2017, 4:17 PM  [place] [address] [place], [place], [address] Phone: [phone]   Fax:  [phone]  Name: REESE STOCKMAN MRN: [id number] Date of Birth: [birth year]

## 2017-10-23 NOTE — Patient Instructions (Addendum)
Feet Together, Head Motion - Eyes Closed    With eyes closed and feet together, move head slowly, up and down. Repeat _10___ times per session. Do 1-2____ sessions per day.  Copyright  V[employer]. All rights reserved.  Feet Heel-Toe "Tandem", Arm Motion - Eyes Open    With eyes open, right foot directly in front of the other, move arms up and down: to front. Repeat _10___ times per session. Do __1-2__ sessions per day.  Copyright  VHI. All rights reserved.

## 2017-10-24 ENCOUNTER — Ambulatory Visit (HOSPITAL_COMMUNITY): Payer: PPO

## 2017-10-28 ENCOUNTER — Telehealth (HOSPITAL_COMMUNITY): Payer: Self-pay | Admitting: Physical Therapy

## 2017-10-28 NOTE — Telephone Encounter (Signed)
Patient want to temporarily be put  on hold until September then he will decided if he will return. He canceled all of his appt for August

## 2017-10-29 ENCOUNTER — Ambulatory Visit (HOSPITAL_COMMUNITY): Payer: PPO | Admitting: Physical Therapy

## 2017-10-31 ENCOUNTER — Ambulatory Visit (HOSPITAL_COMMUNITY): Payer: PPO | Admitting: Physical Therapy

## 2017-11-04 ENCOUNTER — Ambulatory Visit (HOSPITAL_COMMUNITY): Payer: PPO | Admitting: Physical Therapy

## 2017-11-04 DIAGNOSIS — I480 Paroxysmal atrial fibrillation: Secondary | ICD-10-CM | POA: Diagnosis not present

## 2017-11-04 DIAGNOSIS — M179 Osteoarthritis of knee, unspecified: Secondary | ICD-10-CM | POA: Diagnosis not present

## 2017-11-04 DIAGNOSIS — R1310 Dysphagia, unspecified: Secondary | ICD-10-CM | POA: Diagnosis not present

## 2017-11-04 DIAGNOSIS — I639 Cerebral infarction, unspecified: Secondary | ICD-10-CM | POA: Diagnosis not present

## 2017-11-04 DIAGNOSIS — E7849 Other hyperlipidemia: Secondary | ICD-10-CM | POA: Diagnosis not present

## 2017-11-04 DIAGNOSIS — I10 Essential (primary) hypertension: Secondary | ICD-10-CM | POA: Diagnosis not present

## 2017-11-04 DIAGNOSIS — R2689 Other abnormalities of gait and mobility: Secondary | ICD-10-CM | POA: Diagnosis not present

## 2017-11-04 DIAGNOSIS — I5032 Chronic diastolic (congestive) heart failure: Secondary | ICD-10-CM | POA: Diagnosis not present

## 2017-11-04 DIAGNOSIS — E1159 Type 2 diabetes mellitus with other circulatory complications: Secondary | ICD-10-CM | POA: Diagnosis not present

## 2017-11-04 DIAGNOSIS — Z794 Long term (current) use of insulin: Secondary | ICD-10-CM | POA: Diagnosis not present

## 2017-11-04 DIAGNOSIS — I350 Nonrheumatic aortic (valve) stenosis: Secondary | ICD-10-CM | POA: Diagnosis not present

## 2017-11-04 DIAGNOSIS — E1142 Type 2 diabetes mellitus with diabetic polyneuropathy: Secondary | ICD-10-CM | POA: Diagnosis not present

## 2017-11-07 ENCOUNTER — Ambulatory Visit (HOSPITAL_COMMUNITY): Payer: PPO

## 2017-11-12 ENCOUNTER — Ambulatory Visit (HOSPITAL_COMMUNITY): Payer: PPO

## 2017-11-14 ENCOUNTER — Ambulatory Visit (HOSPITAL_COMMUNITY): Payer: PPO | Admitting: Physical Therapy

## 2017-11-18 ENCOUNTER — Ambulatory Visit: Payer: PPO | Admitting: Internal Medicine

## 2017-11-18 ENCOUNTER — Encounter: Payer: Self-pay | Admitting: Internal Medicine

## 2017-11-18 VITALS — BP 100/62 | HR 74 | Ht 67.0 in | Wt 222.6 lb

## 2017-11-18 DIAGNOSIS — I10 Essential (primary) hypertension: Secondary | ICD-10-CM

## 2017-11-18 DIAGNOSIS — I350 Nonrheumatic aortic (valve) stenosis: Secondary | ICD-10-CM | POA: Diagnosis not present

## 2017-11-18 NOTE — Patient Instructions (Addendum)
Samples of eliquis 5mg  provided  Your physician has recommended you make the following change in your medication: STOP amlodipine  Your physician has requested that you have an echocardiogram. Echocardiography is a painless test that uses sound waves to create images of your heart. It provides your doctor with information about the size and shape of your heart and how well your heart's chambers and valves are working. This procedure takes approximately one hour. There are no restrictions for this procedure. -- this test is done at [address]  Your physician wants you to follow-up in: 6 months with Dr. Debara Pickett. You will receive a reminder letter in the mail two months in advance. If you don't receive a letter, please call our office to schedule the follow-up appointment.
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
## 2017-11-19 ENCOUNTER — Telehealth: Payer: Self-pay | Admitting: Internal Medicine

## 2017-11-19 ENCOUNTER — Ambulatory Visit (HOSPITAL_COMMUNITY): Payer: PPO | Admitting: Physical Therapy

## 2017-11-19 NOTE — Telephone Encounter (Signed)
New Message         Patient is calling to ask if patient is suppose stop a particular medication?

## 2017-11-19 NOTE — Telephone Encounter (Addendum)
Spoke with pt's wife who was inquiring about what medication pt was supposed to stop. Informed that per OV on 8/19, pt was to stop amlodipine. Verbalized understanding.

## 2017-11-21 ENCOUNTER — Ambulatory Visit (HOSPITAL_COMMUNITY): Payer: PPO | Admitting: Physical Therapy

## 2017-11-26 ENCOUNTER — Ambulatory Visit (HOSPITAL_COMMUNITY): Payer: PPO | Admitting: Physical Therapy

## 2017-11-28 ENCOUNTER — Ambulatory Visit (HOSPITAL_COMMUNITY): Payer: PPO | Admitting: Physical Therapy

## 2017-12-04 ENCOUNTER — Other Ambulatory Visit (HOSPITAL_COMMUNITY): Payer: PPO

## 2017-12-24 ENCOUNTER — Telehealth (HOSPITAL_COMMUNITY): Payer: Self-pay | Admitting: Physical Therapy

## 2017-12-24 NOTE — Telephone Encounter (Signed)
PHYSICAL THERAPY DISCHARGE SUMMARY  Visits from Start of Care: 10  Current functional level related to goals / functional outcomes: See reassessment on 10/23/2017   Remaining deficits: See reassessment    Education / Equipment: HEP Plan: Patient agrees to discharge.  Patient goals were partially met. Patient is being discharged due to the patient's request.  ?????Rayetta Humphrey, [place] CLT [phone]

## 2018-01-17 DIAGNOSIS — G4733 Obstructive sleep apnea (adult) (pediatric): Secondary | ICD-10-CM | POA: Diagnosis not present

## 2018-03-04 DIAGNOSIS — I10 Essential (primary) hypertension: Secondary | ICD-10-CM | POA: Diagnosis not present

## 2018-03-04 DIAGNOSIS — Z7901 Long term (current) use of anticoagulants: Secondary | ICD-10-CM | POA: Diagnosis not present

## 2018-03-04 DIAGNOSIS — I350 Nonrheumatic aortic (valve) stenosis: Secondary | ICD-10-CM | POA: Diagnosis not present

## 2018-03-04 DIAGNOSIS — I480 Paroxysmal atrial fibrillation: Secondary | ICD-10-CM | POA: Diagnosis not present

## 2018-03-04 DIAGNOSIS — I779 Disorder of arteries and arterioles, unspecified: Secondary | ICD-10-CM | POA: Diagnosis not present

## 2018-03-04 DIAGNOSIS — E1142 Type 2 diabetes mellitus with diabetic polyneuropathy: Secondary | ICD-10-CM | POA: Diagnosis not present

## 2018-03-04 DIAGNOSIS — E1159 Type 2 diabetes mellitus with other circulatory complications: Secondary | ICD-10-CM | POA: Diagnosis not present

## 2018-03-04 DIAGNOSIS — E1140 Type 2 diabetes mellitus with diabetic neuropathy, unspecified: Secondary | ICD-10-CM | POA: Diagnosis not present

## 2018-03-04 DIAGNOSIS — Z6835 Body mass index (BMI) 35.0-35.9, adult: Secondary | ICD-10-CM | POA: Diagnosis not present

## 2018-03-04 DIAGNOSIS — I6990 Unspecified sequelae of unspecified cerebrovascular disease: Secondary | ICD-10-CM | POA: Diagnosis not present

## 2018-03-04 DIAGNOSIS — E7849 Other hyperlipidemia: Secondary | ICD-10-CM | POA: Diagnosis not present

## 2019-01-07 NOTE — H&P (Addendum)
Surgical History & Physical  Patient Name: [name] DOB: 12-23-1940  Surgery: Cataract extraction with intraocular lens implant phacoemulsification; Left Eye  Surgeon: [name] MD Surgery Date:  01/30/2019 Pre-Op Date:  01/14/2019  HPI: A 21 Yr. old male patient referred by Dr. Jorja Loa 1. The patient complains of difficulty when viewing TV, reading closed caption, news scrolls on TV, which began 2 years ago. Both eyes are affected. The episode is constant and gradual. The patient describes foggy, glare and hazy symptoms affecting their eyes/vision. The condition's severity is worsening. Symptoms occur when the patient is inside, outside and reading. *Patient has been limited to [facility] since stroke ~ 7 years ago but having difficulty functioning walking with cane and recognizing people with [facility] in the last 1-2 years. No sudden or significant changes noted since stroke occurred. BS ~ 100 this am last A1C ~7.1 HPI was performed by [name] .  Medical History: Cataracts Diabetes High Blood Pressure LDL Stroke  Review of Systems Musculoskeletal Joint Ache Neurological Stroke All recorded systems are negative except as noted above.  Social   Never smoked   Medication Myrbetriq, Eliquis, Fenofibrate, Losartan-Hydrochlorothiazide, Carvedilol, Simvastatin, Escitalopram, Lantus, Humalog,   Sx/Procedures  None  Drug Allergies   NKDA  History & Physical: Heent:  Cataract, Left eye NECK: supple without bruits LUNGS: lungs clear to auscultation CV: regular rate and rhythm Abdomen: soft and non-tender  Impression & Plan: Assessment: 1.  COMBINED FORMS AGE RELATED CATARACT; Both Eyes (H25.813)  Plan: 1.  Cataract accounts for the patient's decreased vision. This visual impairment is not correctable with a tolerable change in glasses or contact lenses. Cataract surgery with an implantation of a new lens should significantly improve the visual and  functional status of the patient. Discussed all risks, benefits, alternatives, and potential complications. Discussed the procedures and recovery. Patient desires to have surgery. A-scan ordered and performed today for intra-ocular lens calculations. The surgery will be performed in order to improve vision for driving, reading, and for eye examinations. Recommend phacoemulsification with intra-ocular lens. Left Eye worse - first. Dilates poorly - shugacaine by protocol. Malyugin ring in room. Omidiria.

## 2019-01-12 NOTE — Patient Instructions (Signed)
Your procedure is scheduled on:  01/16/2019               Report to [facility] at  11:35   AM.  Call this number if you have problems the morning of surgery: [phone]   Do not eat or drink :After Midnight.    Take these medicines the morning of surgery with A SIP OF WATER:  Coreg, Lexapro              Take only 1/2 dose of lantus insulin 13 units the night before surgery          Do not wear jewelry, make-up or nail polish.  Do not wear lotions, powders, or perfumes. You may wear deodorant.  Do not bring valuables to the hospital.  Contacts, dentures or bridgework may not be worn into surgery.  Patients discharged the day of surgery will not be allowed to drive home.  Name and phone number of your driver.                                                                                                                                       Cataract Surgery  A cataract is a clouding of the lens of the eye. When a lens becomes cloudy, vision is reduced based on the degree and nature of the clouding. Surgery may be needed to improve vision. Surgery removes the cloudy lens and usually replaces it with a substitute lens (intraocular lens, IOL). LET YOUR EYE DOCTOR KNOW ABOUT:  Allergies to food or medicine.   Medicines taken including herbs, eyedrops, over-the-counter medicines, and creams.   Use of steroids (by mouth or creams).   Previous problems with anesthetics or numbing medicine.   History of bleeding problems or blood clots.   Previous surgery.   Other health problems, including diabetes and kidney problems.   Possibility of pregnancy, if this applies.  RISKS AND COMPLICATIONS  Infection.   Inflammation of the eyeball (endophthalmitis) that can spread to both eyes (sympathetic ophthalmia).   Poor wound healing.   If an IOL is inserted, it can later fall out of proper position. This is very uncommon.   Clouding of the part of your eye that holds an IOL in place.  This is called an "after-cataract." These are uncommon, but easily treated.  BEFORE THE PROCEDURE  Do not eat or drink anything except small amounts of water for 8 to 12 before your surgery, or as directed by your caregiver.    Unless you are told otherwise, continue any eyedrops you have been prescribed.   Talk to your primary caregiver about all other medicines that you take (both prescription and non-prescription). In some cases, you may need to stop or change medicines near the time of your surgery. This is most important if you are taking blood-thinning medicine. Do not stop medicines unless you are told to do so.  Arrange for someone to drive you to and from the procedure.   Do not put contact lenses in either eye on the day of your surgery.  PROCEDURE There is more than one method for safely removing a cataract. Your doctor can explain the differences and help determine which is best for you. Phacoemulsification surgery is the most common form of cataract surgery.  An injection is given behind the eye or eyedrops are given to make this a painless procedure.   A small cut (incision) is made on the edge of the clear, dome-shaped surface that covers the front of the eye (cornea).   A tiny probe is painlessly inserted into the eye. This device gives off ultrasound waves that soften and break up the cloudy center of the lens. This makes it easier for the cloudy lens to be removed by suction.   An IOL may be implanted.   The normal lens of the eye is covered by a clear capsule. Part of that capsule is intentionally left in the eye to support the IOL.   Your surgeon may or may not use stitches to close the incision.  There are other forms of cataract surgery that require a larger incision and stiches to close the eye. This approach is taken in cases where the doctor feels that the cataract cannot be easily removed using phacoemulsification. AFTER THE PROCEDURE  When an IOL is  implanted, it does not need care. It becomes a permanent part of your eye and cannot be seen or felt.   Your doctor will schedule follow-up exams to check on your progress.   Review your other medicines with your doctor to see which can be resumed after surgery.   Use eyedrops or take medicine as prescribed by your doctor.  Document Released: 03/08/2011 Document Reviewed: 03/05/2011 [facility] Patient Information 2012 [place].  .Cataract Surgery Care After Refer to this sheet in the next few weeks. These instructions provide you with information on caring for yourself after your procedure. Your caregiver may also give you more specific instructions. Your treatment has been planned according to current medical practices, but problems sometimes occur. Call your caregiver if you have any problems or questions after your procedure.  HOME CARE INSTRUCTIONS   Avoid strenuous activities as directed by your caregiver.   Ask your caregiver when you can resume driving.   Use eyedrops or other medicines to help healing and control pressure inside your eye as directed by your caregiver.   Only take over-the-counter or prescription medicines for pain, discomfort, or fever as directed by your caregiver.   Do not to touch or rub your eyes.   You may be instructed to use a protective shield during the first few days and nights after surgery. If not, wear sunglasses to protect your eyes. This is to protect the eye from pressure or from being accidentally bumped.   Keep the area around your eye clean and dry. Avoid swimming or allowing water to hit you directly in the face while showering. Keep soap and shampoo out of your eyes.   Do not bend or lift heavy objects. Bending increases pressure in the eye. You can walk, climb stairs, and do light household chores.   Do not put a contact lens into the eye that had surgery until your caregiver says it is okay to do so.   Ask your doctor when you can  return to work. This will depend on the kind of work that you do.  If you work in a dusty environment, you may be advised to wear protective eyewear for a period of time.   Ask your caregiver when it will be safe to engage in sexual activity.   Continue with your regular eye exams as directed by your caregiver.  What to expect:  It is normal to feel itching and mild discomfort for a few days after cataract surgery. Some fluid discharge is also common, and your eye may be sensitive to light and touch.   After 1 to 2 days, even moderate discomfort should disappear. In most cases, healing will take about 6 weeks.   If you received an intraocular lens (IOL), you may notice that colors are very bright or have a blue tinge. Also, if you have been in bright sunlight, everything may appear reddish for a few hours. If you see these color tinges, it is because your lens is clear and no longer cloudy. Within a few months after receiving an IOL, these extra colors should go away. When you have healed, you will probably need new glasses.  SEEK MEDICAL CARE IF:   You have increased bruising around your eye.   You have discomfort not helped by medicine.  SEEK IMMEDIATE MEDICAL CARE IF:   You have a  fever.   You have a worsening or sudden vision loss.   You have redness, swelling, or increasing pain in the eye.   You have a thick discharge from the eye that had surgery.  MAKE SURE YOU:  Understand these instructions.   Will watch your condition.   Will get help right away if you are not doing well or get worse.  Document Released: 10/06/2004 Document Revised: 03/08/2011 Document Reviewed: 11/10/2010 [facility] Patient Information 2012 [place].    Monitored Anesthesia Care  Monitored anesthesia care is an anesthesia service for a medical procedure. Anesthesia is the loss of the ability to feel pain. It is produced by medications called anesthetics. It may affect a small area of your body  (local anesthesia), a large area of your body (regional anesthesia), or your entire body (general anesthesia). The need for monitored anesthesia care depends your procedure, your condition, and the potential need for regional or general anesthesia. It is often provided during procedures where:   General anesthesia may be needed if there are complications. This is because you need special care when you are under general anesthesia.    You will be under local or regional anesthesia. This is so that you are able to have higher levels of anesthesia if needed.    You will receive calming medications (sedatives). This is especially the case if sedatives are given to put you in a semi-conscious state of relaxation (deep sedation). This is because the amount of sedative needed to produce this state can be hard to predict. Too much of a sedative can produce general anesthesia. Monitored anesthesia care is performed by one or more caregivers who have special training in all types of anesthesia. You will need to meet with these caregivers before your procedure. During this meeting, they will ask you about your medical history. They will also give you instructions to follow. (For example, you will need to stop eating and drinking before your procedure. You may also need to stop or change medications you are taking.) During your procedure, your caregivers will stay with you. They will:   Watch your condition. This includes watching you blood pressure, breathing, and level of pain.    Diagnose  and treat problems that occur.    Give medications if they are needed. These may include calming medications (sedatives) and anesthetics.    Make sure you are comfortable.   Having monitored anesthesia care does not necessarily mean that you will be under anesthesia. It does mean that your caregivers will be able to manage anesthesia if you need it or if it occurs. It also means that you will be able to have a different type  of anesthesia than you are having if you need it. When your procedure is complete, your caregivers will continue to watch your condition. They will make sure any medications wear off before you are allowed to go home.  Document Released: 12/13/2004 Document Revised: 07/14/2012 Document Reviewed: 04/30/2012 [facility] Patient Information 2014 [place], [place].

## 2019-01-14 ENCOUNTER — Other Ambulatory Visit: Payer: Self-pay

## 2019-01-14 ENCOUNTER — Other Ambulatory Visit (HOSPITAL_COMMUNITY)
Admission: RE | Admit: 2019-01-14 | Discharge: 2019-01-14 | Disposition: A | Discharge status: Home / Self Care | Payer: Medicare Other | Source: Ambulatory Visit | Attending: Ophthalmology | Admitting: Ophthalmology

## 2019-01-14 ENCOUNTER — Encounter (HOSPITAL_COMMUNITY)
Admission: RE | Admit: 2019-01-14 | Discharge: 2019-01-14 | Disposition: A | Discharge status: Home / Self Care | Payer: Medicare Other | Source: Ambulatory Visit | Attending: Ophthalmology | Admitting: Ophthalmology

## 2019-01-14 ENCOUNTER — Encounter (HOSPITAL_COMMUNITY): Payer: Self-pay

## 2019-01-14 DIAGNOSIS — E119 Type 2 diabetes mellitus without complications: Secondary | ICD-10-CM | POA: Insufficient documentation

## 2019-01-14 DIAGNOSIS — Z7901 Long term (current) use of anticoagulants: Secondary | ICD-10-CM | POA: Insufficient documentation

## 2019-01-14 DIAGNOSIS — Z01812 Encounter for preprocedural laboratory examination: Secondary | ICD-10-CM | POA: Diagnosis present

## 2019-01-14 DIAGNOSIS — I10 Essential (primary) hypertension: Secondary | ICD-10-CM | POA: Diagnosis not present

## 2019-01-14 DIAGNOSIS — Z794 Long term (current) use of insulin: Secondary | ICD-10-CM | POA: Diagnosis not present

## 2019-01-14 DIAGNOSIS — Z79899 Other long term (current) drug therapy: Secondary | ICD-10-CM | POA: Insufficient documentation

## 2019-01-14 DIAGNOSIS — Z20828 Contact with and (suspected) exposure to other viral communicable diseases: Secondary | ICD-10-CM | POA: Diagnosis not present

## 2019-01-14 DIAGNOSIS — H269 Unspecified cataract: Secondary | ICD-10-CM | POA: Diagnosis not present

## 2019-01-14 LAB — BASIC METABOLIC PANEL
Anion gap: 9 (ref 5–15)
BUN: 22 mg/dL (ref 8–23)
CO2: 27 mmol/L (ref 22–32)
Calcium: 9.2 mg/dL (ref 8.9–10.3)
Chloride: 108 mmol/L (ref 98–111)
Creatinine, Ser: 1.33 mg/dL — ABNORMAL HIGH (ref 0.61–1.24)
GFR calc Af Amer: 59 mL/min — ABNORMAL LOW (ref >60)
GFR calc non Af Amer: 51 mL/min — ABNORMAL LOW (ref >60)
Glucose, Bld: 201 mg/dL — ABNORMAL HIGH (ref 70–99)
Potassium: 3.7 mmol/L (ref 3.5–5.1)
Sodium: 144 mmol/L (ref 135–145)

## 2019-01-14 LAB — HEMOGLOBIN A1C
Hgb A1c MFr Bld: 6.9 % — ABNORMAL HIGH (ref 4.8–5.6)
Mean Plasma Glucose: 151.33 mg/dL

## 2019-01-14 LAB — SARS CORONAVIRUS 2 (TAT 6-24 HRS): SARS Coronavirus 2: NEGATIVE

## 2019-01-28 ENCOUNTER — Other Ambulatory Visit: Payer: Self-pay

## 2019-01-28 ENCOUNTER — Other Ambulatory Visit (HOSPITAL_COMMUNITY)
Admission: RE | Admit: 2019-01-28 | Discharge: 2019-01-28 | Disposition: A | Discharge status: Home / Self Care | Payer: Medicare Other | Source: Ambulatory Visit | Attending: Ophthalmology | Admitting: Ophthalmology

## 2019-01-28 ENCOUNTER — Encounter (HOSPITAL_COMMUNITY)
Admission: RE | Admit: 2019-01-28 | Discharge: 2019-01-28 | Disposition: A | Discharge status: Home / Self Care | Payer: Medicare Other | Source: Ambulatory Visit | Attending: Ophthalmology | Admitting: Ophthalmology

## 2019-01-28 DIAGNOSIS — Z20828 Contact with and (suspected) exposure to other viral communicable diseases: Secondary | ICD-10-CM | POA: Insufficient documentation

## 2019-01-28 DIAGNOSIS — Z01812 Encounter for preprocedural laboratory examination: Secondary | ICD-10-CM | POA: Insufficient documentation

## 2019-01-28 LAB — SARS CORONAVIRUS 2 (TAT 6-24 HRS): SARS Coronavirus 2: NEGATIVE

## 2019-01-30 ENCOUNTER — Encounter (HOSPITAL_COMMUNITY): Payer: Self-pay | Admitting: *Deleted

## 2019-01-30 ENCOUNTER — Encounter (HOSPITAL_COMMUNITY)
Admission: RE | Disposition: A | Discharge status: Home / Self Care | Payer: Self-pay | Source: Home / Self Care | Attending: Ophthalmology

## 2019-01-30 ENCOUNTER — Ambulatory Visit (HOSPITAL_COMMUNITY)
Admission: RE | Admit: 2019-01-30 | Discharge: 2019-01-30 | Disposition: A | Discharge status: Home / Self Care | Payer: Medicare Other | Attending: Ophthalmology | Admitting: Ophthalmology

## 2019-01-30 ENCOUNTER — Ambulatory Visit (HOSPITAL_COMMUNITY): Payer: Medicare Other | Admitting: Anesthesiology

## 2019-01-30 ENCOUNTER — Other Ambulatory Visit: Payer: Self-pay

## 2019-01-30 DIAGNOSIS — K219 Gastro-esophageal reflux disease without esophagitis: Secondary | ICD-10-CM | POA: Diagnosis not present

## 2019-01-30 DIAGNOSIS — H25812 Combined forms of age-related cataract, left eye: Secondary | ICD-10-CM | POA: Insufficient documentation

## 2019-01-30 DIAGNOSIS — I10 Essential (primary) hypertension: Secondary | ICD-10-CM | POA: Diagnosis not present

## 2019-01-30 DIAGNOSIS — Z7901 Long term (current) use of anticoagulants: Secondary | ICD-10-CM | POA: Diagnosis not present

## 2019-01-30 DIAGNOSIS — H2181 Floppy iris syndrome: Secondary | ICD-10-CM | POA: Insufficient documentation

## 2019-01-30 DIAGNOSIS — E1136 Type 2 diabetes mellitus with diabetic cataract: Secondary | ICD-10-CM | POA: Insufficient documentation

## 2019-01-30 DIAGNOSIS — Z794 Long term (current) use of insulin: Secondary | ICD-10-CM | POA: Diagnosis not present

## 2019-01-30 DIAGNOSIS — I083 Combined rheumatic disorders of mitral, aortic and tricuspid valves: Secondary | ICD-10-CM | POA: Insufficient documentation

## 2019-01-30 DIAGNOSIS — Z79899 Other long term (current) drug therapy: Secondary | ICD-10-CM | POA: Insufficient documentation

## 2019-01-30 DIAGNOSIS — Z8673 Personal history of transient ischemic attack (TIA), and cerebral infarction without residual deficits: Secondary | ICD-10-CM | POA: Diagnosis not present

## 2019-01-30 DIAGNOSIS — F329 Major depressive disorder, single episode, unspecified: Secondary | ICD-10-CM | POA: Diagnosis not present

## 2019-01-30 DIAGNOSIS — I4891 Unspecified atrial fibrillation: Secondary | ICD-10-CM | POA: Diagnosis not present

## 2019-01-30 HISTORY — PX: CATARACT EXTRACTION W/PHACO: SHX586

## 2019-01-30 LAB — GLUCOSE, CAPILLARY: Glucose-Capillary: 174 mg/dL — ABNORMAL HIGH (ref 70–99)

## 2019-01-30 SURGERY — PHACOEMULSIFICATION, CATARACT, WITH IOL INSERTION
Anesthesia: Monitor Anesthesia Care | Site: Eye | Laterality: Left

## 2019-01-30 MED ORDER — PHENYLEPHRINE HCL 2.5 % OP SOLN
1.0000 [drp] | OPHTHALMIC | Status: AC | PRN
  Administered 2019-01-30 (×3): 1 [drp] via OPHTHALMIC

## 2019-01-30 MED ORDER — LIDOCAINE HCL (PF) 1 % IJ SOLN
INTRAOCULAR | Status: DC | PRN
  Administered 2019-01-30: 1 mL via OPHTHALMIC

## 2019-01-30 MED ORDER — SODIUM HYALURONATE 23 MG/ML IO SOLN
INTRAOCULAR | Status: DC | PRN
  Administered 2019-01-30: 0.6 mL via INTRAOCULAR

## 2019-01-30 MED ORDER — TETRACAINE HCL 0.5 % OP SOLN
1.0000 [drp] | OPHTHALMIC | Status: AC | PRN
  Administered 2019-01-30 (×3): 1 [drp] via OPHTHALMIC

## 2019-01-30 MED ORDER — CYCLOPENTOLATE-PHENYLEPHRINE 0.2-1 % OP SOLN
1.0000 [drp] | OPHTHALMIC | Status: AC | PRN
  Administered 2019-01-30 (×3): 1 [drp] via OPHTHALMIC

## 2019-01-30 MED ORDER — PROVISC 10 MG/ML IO SOLN
INTRAOCULAR | Status: DC | PRN
  Administered 2019-01-30: 0.85 mL via INTRAOCULAR

## 2019-01-30 MED ORDER — NEOMYCIN-POLYMYXIN-DEXAMETH 3.5-10000-0.1 OP SUSP
OPHTHALMIC | Status: DC | PRN
  Administered 2019-01-30: 1 [drp] via OPHTHALMIC

## 2019-01-30 MED ORDER — PHENYLEPHRINE-KETOROLAC 1-0.3 % IO SOLN
INTRAOCULAR | Status: DC | PRN
  Administered 2019-01-30: 11:00:00 500 mL via OPHTHALMIC

## 2019-01-30 MED ORDER — BSS IO SOLN
INTRAOCULAR | Status: DC | PRN
  Administered 2019-01-30: 15 mL via INTRAOCULAR

## 2019-01-30 MED ORDER — LIDOCAINE HCL 3.5 % OP GEL
1.0000 "application " | Freq: Once | OPHTHALMIC | Status: AC
  Administered 2019-01-30: 1 via OPHTHALMIC

## 2019-01-30 MED ORDER — POVIDONE-IODINE 5 % OP SOLN
OPHTHALMIC | Status: DC | PRN
  Administered 2019-01-30: 1 via OPHTHALMIC

## 2019-01-30 SURGICAL SUPPLY — 14 items

## 2019-01-30 NOTE — Transfer of Care (Signed)
Immediate Anesthesia Transfer of Care Note  Patient: [name]  Procedure(s) Performed: CATARACT EXTRACTION PHACO AND INTRAOCULAR LENS PLACEMENT (IOC) (CDE: 4.76) (Left Eye)  Patient Location: Short Stay  Anesthesia Type:MAC  Level of Consciousness: awake, alert  and patient cooperative  Airway & Oxygen Therapy: Patient Spontanous Breathing  Post-op Assessment: Report given to RN and Post -op Vital signs reviewed and stable  Post vital signs: Reviewed and stable  Last Vitals:  Vitals Value Taken Time  BP 151/80   Temp 98.1   Pulse 77   Resp 20   SpO2 96     Last Pain:  Vitals:   01/30/19 1033  TempSrc: Oral  PainSc: 3       Patients Stated Pain Goal: 5 (31/49/70 2637)  Complications: No apparent anesthesia complications

## 2019-01-30 NOTE — Progress Notes (Signed)
Called two numbers for wife multiple times with no answer and unable to leave message.called [phone] [id number] Offered drink and crackers to pt. Pt in nad.  85 M in parking lot trying to find vehicle.

## 2019-01-30 NOTE — Op Note (Signed)
Date of procedure: 01/30/19  Pre-operative diagnosis: Visually significant combined form cataract, Left Eye; Poor dilation, Left eye (H25.812; H21.81)  Post-operative diagnosis: Visually significant cataract, Left Eye; Intra-operative Floppy Iris Syndrome, Left Eye  Procedure: Complex removal of cataract via phacoemulsification and insertion of intra-ocular lens [employer] PCB00  +16.0D into the capsular bag of the Left Eye (CPT [phone])  Attending surgeon: Gerda Diss. , MD, MA  Anesthesia: MAC, Topical Akten  Complications: None  Estimated Blood Loss: <37m (minimal)  Specimens: None  Implants: As above  Indications:  Visually significant cataract, Left Eye  Procedure:  The patient was seen and identified in the pre-operative area. The operative eye was identified and dilated.  The operative eye was marked.  Topical anesthesia was administered to the operative eye.     The patient was then to the operative suite and placed in the supine position.  A timeout was performed confirming the patient, procedure to be performed, and all other relevant information.   The patient's face was prepped and draped in the usual fashion for intra-ocular surgery.  A lid speculum was placed into the operative eye and the surgical microscope moved into place and focused.  Poor dilation of the iris was confirmed.  An inferotemporal paracentesis was created using a 20 gauge paracentesis blade.  Shugarcaine was injected into the anterior chamber.  Viscoelastic was injected into the anterior chamber.  A temporal clear-corneal main wound incision was created using a 2.411mmicrokeratome.  A Malyugin ring was placed.  A continuous curvilinear capsulorrhexis was initiated using an irrigating cystitome and completed using capsulorrhexis forceps.  Hydrodissection and hydrodeliniation were performed.  Viscoelastic was injected into the anterior chamber.  A phacoemulsification handpiece and a chopper as a  second instrument were used to remove the nucleus and epinucleus. The irrigation/aspiration handpiece was used to remove any remaining cortical material.   The capsular bag was reinflated with viscoelastic, checked, and found to be intact.  The intraocular lens was inserted into the capsular bag and dialed into place using a Kuglen hook. The Malyugin ring was removed.  The irrigation/aspiration handpiece was used to remove any remaining viscoelastic.  The clear corneal wound and paracentesis wounds were then hydrated and checked with Weck-Cels to be watertight.  The lid-speculum and drape was removed, and the patient's face was cleaned with a wet and dry 4x4.  Maxitrol was instilled in the eye before a clear shield was taped over the eye. The patient was taken to the post-operative care unit in good condition, having tolerated the procedure well.  Post-Op Instructions: [facility] and will receive all other orders and instructions.

## 2019-01-30 NOTE — Anesthesia Procedure Notes (Signed)
Procedure Name: MAC Date/Time: 01/30/2019 10:48 AM Performed by: Vista Deck, CRNA Pre-anesthesia Checklist: Patient identified, Emergency Drugs available, Suction available, Timeout performed and Patient being monitored Patient Re-evaluated:Patient Re-evaluated prior to induction Oxygen Delivery Method: Nasal Cannula

## 2019-01-30 NOTE — Discharge Instructions (Signed)
Please discharge patient when stable, will follow up today with Dr.  at the [facility] office immediately following discharge.  Leave shield in place until visit.  All paperwork with discharge instructions will be given at the office. ° °

## 2019-01-30 NOTE — Interval H&P Note (Signed)
History and Physical Interval Note: The H and P was reviewed and updated. The patient was examined.  No changes were found after exam.  The surgical eye was marked.  01/30/2019 10:43 AM  [name]  has presented today for surgery, with the diagnosis of Nuclear sclerotic cataract - Left eye.  The various methods of treatment have been discussed with the patient and family. After consideration of risks, benefits and other options for treatment, the patient has consented to  Procedure(s) with comments: CATARACT EXTRACTION PHACO AND INTRAOCULAR LENS PLACEMENT (IOC) (Left) - left-left message for pt to arrive at 10:35am per KF as a surgical intervention.  The patient's history has been reviewed, patient examined, no change in status, stable for surgery.  I have reviewed the patient's chart and labs.  Questions were answered to the patient's satisfaction.     [name]

## 2019-01-30 NOTE — Anesthesia Postprocedure Evaluation (Signed)
Anesthesia Post Note  Patient: [name]  Procedure(s) Performed: CATARACT EXTRACTION PHACO AND INTRAOCULAR LENS PLACEMENT (IOC) (CDE: 4.76) (Left Eye)  Patient location during evaluation: Short Stay Anesthesia Type: MAC Level of consciousness: awake and alert Pain management: pain level controlled Vital Signs Assessment: post-procedure vital signs reviewed and stable Respiratory status: spontaneous breathing Cardiovascular status: stable Postop Assessment: no apparent nausea or vomiting Anesthetic complications: no     Last Vitals:  Vitals:   01/30/19 1033  BP: 138/77  Pulse: 75  Resp: 18  Temp: 36.7 C  SpO2: 98%    Last Pain:  Vitals:   01/30/19 1033  TempSrc: Oral  PainSc: 3                  ,

## 2019-01-30 NOTE — Anesthesia Preprocedure Evaluation (Addendum)
Anesthesia Evaluation  Patient identified by MRN, date of birth, ID band Patient awake    Reviewed: Allergy & Precautions, NPO status , Patient's Chart, lab work & pertinent test results, reviewed documented beta blocker date and time   Airway Mallampati: III  TM Distance: >3 FB Neck ROM: Full    Dental  (+) Missing   Pulmonary shortness of breath and with exertion, sleep apnea and Continuous Positive Airway Pressure Ventilation ,    breath sounds clear to auscultation       Cardiovascular Exercise Tolerance: Poor hypertension, Pt. on medications and Pt. on home beta blockers + dysrhythmias Atrial Fibrillation  Rhythm:Irregular Rate:Normal  Left ventricle: The cavity size was normal. There was moderate   concentric hypertrophy. Systolic function was normal. The   estimated ejection fraction was in the range of 55% to 60%.   Images were inadequate for LV wall motion assessment. No gross   regional variation on apical 4-chamber views. Doppler parameters   are consistent with abnormal left ventricular relaxation (grade 1   diastolic dysfunction). Doppler parameters are consistent with   high ventricular filling pressure. - Aortic valve: Moderately calcified annulus. Mildly thickened,   mildly calcified leaflets. Cusp separation was reduced. There was   mild to moderate stenosis. There was mild regurgitation. Peak   velocity (S): 236 cm/s. Mean gradient (S): 12 mm Hg. Valve area   (VTI): 1.39 cm^2. Valve area (Vmax): 1.42 cm^2. Valve area   (Vmean): 1.41 cm^2. - Aorta: Mild aortic root dilatation. Aortic root dimension: 41 mm   (ED). - Mitral valve: Calcified annulus. Mildly thickened leaflets .   There was mild regurgitation. - Left atrium: The atrium was moderately dilated. - Right ventricle: Systolic function was mildly reduced. - Tricuspid valve: There was mild regurgitation. [id number] - A FIB, Primary T wave abnormality    Neuro/Psych PSYCHIATRIC DISORDERS Depression CVA, Residual Symptoms    GI/Hepatic GERD  Medicated,  Endo/Other  diabetes, Well Controlled, Type 2, Insulin Dependent  Renal/GU      Musculoskeletal   Abdominal   Peds  Hematology   Anesthesia Other Findings   Reproductive/Obstetrics                            Anesthesia Physical Anesthesia Plan  ASA: IV  Anesthesia Plan: MAC   Post-op Pain Management:    Induction:   PONV Risk Score and Plan:   Airway Management Planned: Nasal Cannula and Natural Airway  Additional Equipment:   Intra-op Plan:   Post-operative Plan:   Informed Consent: I have reviewed the patients History and Physical, chart, labs and discussed the procedure including the risks, benefits and alternatives for the proposed anesthesia with the patient or authorized representative who has indicated his/her understanding and acceptance.       Plan Discussed with: CRNA  Anesthesia Plan Comments:         Anesthesia Quick Evaluation

## 2019-02-02 ENCOUNTER — Encounter (HOSPITAL_COMMUNITY): Payer: Self-pay | Admitting: Ophthalmology

## 2019-02-17 ENCOUNTER — Encounter (HOSPITAL_COMMUNITY): Admission: RE | Admit: 2019-02-17 | Payer: Medicare Other | Source: Ambulatory Visit

## 2019-02-18 ENCOUNTER — Other Ambulatory Visit (HOSPITAL_COMMUNITY): Payer: Medicare Other

## 2019-02-20 ENCOUNTER — Encounter: Admission: RE | Payer: Self-pay | Source: Home / Self Care

## 2019-02-20 ENCOUNTER — Ambulatory Visit: Admission: RE | Admit: 2019-02-20 | Payer: Medicare Other | Source: Home / Self Care | Admitting: Ophthalmology

## 2019-02-20 SURGERY — PHACOEMULSIFICATION, CATARACT, WITH IOL INSERTION
Anesthesia: Monitor Anesthesia Care | Laterality: Right

## 2019-04-28 ENCOUNTER — Ambulatory Visit: Payer: Medicare Other | Admitting: Internal Medicine

## 2019-04-28 ENCOUNTER — Other Ambulatory Visit: Payer: Self-pay

## 2019-04-28 ENCOUNTER — Encounter: Payer: Self-pay | Admitting: Internal Medicine

## 2019-04-28 VITALS — BP 123/61 | HR 77 | Temp 97.5°F | Ht 68.0 in | Wt 218.6 lb

## 2019-04-28 DIAGNOSIS — R5383 Other fatigue: Secondary | ICD-10-CM | POA: Diagnosis not present

## 2019-04-28 DIAGNOSIS — R0602 Shortness of breath: Secondary | ICD-10-CM | POA: Diagnosis not present

## 2019-04-28 DIAGNOSIS — I350 Nonrheumatic aortic (valve) stenosis: Secondary | ICD-10-CM | POA: Diagnosis not present

## 2019-04-28 DIAGNOSIS — Z7901 Long term (current) use of anticoagulants: Secondary | ICD-10-CM | POA: Diagnosis not present

## 2019-04-28 DIAGNOSIS — Z79899 Other long term (current) drug therapy: Secondary | ICD-10-CM

## 2019-04-28 NOTE — Patient Instructions (Signed)
Medication Instructions:  Your physician recommends that you continue on your current medications as directed. Please refer to the Current Medication list given to you today.  *If you need a refill on your cardiac medications before your next appointment, please call your pharmacy*  Lab Work: LABS today - CBC, CMET, BNP If you have labs (blood work) drawn today and your tests are completely normal, you will receive your results only by: Marland Kitchen MyChart Message (if you have MyChart) OR . A paper copy in the mail If you have any lab test that is abnormal or we need to change your treatment, we will call you to review the results.  Testing/Procedures: Echo @ [address] - 3rd Floor  Follow-Up: At [facility], you and your health needs are our priority.  As part of our continuing mission to provide you with exceptional heart care, we have created designated Provider Care Teams.  These Care Teams include your primary Cardiologist (physician) and Advanced Practice Providers (APPs -  Physician Assistants and Nurse Practitioners) who all work together to provide you with the care you need, when you need it.  Your next appointment:  With Dr. Debara Pickett in the office after your echocardiogram
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
## 2019-04-29 LAB — COMPREHENSIVE METABOLIC PANEL
ALT: 14 IU/L (ref 0–44)
AST: 26 IU/L (ref 0–40)
Albumin/Globulin Ratio: 2.2 (ref 1.2–2.2)
Albumin: 4.7 g/dL (ref 3.7–4.7)
Alkaline Phosphatase: 76 IU/L (ref 39–117)
BUN/Creatinine Ratio: 15 (ref 10–24)
BUN: 17 mg/dL (ref 8–27)
Bilirubin Total: 0.9 mg/dL (ref 0.0–1.2)
CO2: 26 mmol/L (ref 20–29)
Calcium: 9.7 mg/dL (ref 8.6–10.2)
Chloride: 105 mmol/L (ref 96–106)
Creatinine, Ser: 1.12 mg/dL (ref 0.76–1.27)
GFR calc Af Amer: 73 mL/min/{1.73_m2} (ref >59)
GFR calc non Af Amer: 63 mL/min/{1.73_m2} (ref >59)
Globulin, Total: 2.1 g/dL (ref 1.5–4.5)
Glucose: 154 mg/dL — ABNORMAL HIGH (ref 65–99)
Potassium: 4.3 mmol/L (ref 3.5–5.2)
Sodium: 144 mmol/L (ref 134–144)
Total Protein: 6.8 g/dL (ref 6.0–8.5)

## 2019-04-29 LAB — CBC
Hematocrit: 45.6 % (ref 37.5–51.0)
Hemoglobin: 15.7 g/dL (ref 13.0–17.7)
MCH: 31.2 pg (ref 26.6–33.0)
MCHC: 34.4 g/dL (ref 31.5–35.7)
MCV: 91 fL (ref 79–97)
Platelets: 177 10*3/uL (ref 150–450)
RBC: 5.03 x10E6/uL (ref 4.14–5.80)
RDW: 12.1 % (ref 11.6–15.4)
WBC: 6.1 10*3/uL (ref 3.4–10.8)

## 2019-04-29 LAB — BRAIN NATRIURETIC PEPTIDE: BNP: 152.7 pg/mL — ABNORMAL HIGH (ref 0.0–100.0)

## 2019-05-06 ENCOUNTER — Encounter: Payer: Self-pay | Admitting: Podiatry

## 2019-05-06 ENCOUNTER — Ambulatory Visit: Payer: Medicare Other | Admitting: Podiatry

## 2019-05-06 ENCOUNTER — Other Ambulatory Visit: Payer: Self-pay

## 2019-05-06 DIAGNOSIS — E119 Type 2 diabetes mellitus without complications: Secondary | ICD-10-CM

## 2019-05-06 DIAGNOSIS — M79676 Pain in unspecified toe(s): Secondary | ICD-10-CM

## 2019-05-06 DIAGNOSIS — B351 Tinea unguium: Secondary | ICD-10-CM | POA: Diagnosis not present

## 2019-05-06 DIAGNOSIS — D689 Coagulation defect, unspecified: Secondary | ICD-10-CM

## 2019-05-06 NOTE — Progress Notes (Signed)
Patient ID: [name], male   DOB: June 03, 1940, 79 y.o.   MRN: [id number] Complaint:  Visit Type: Patient returns to my office for continued preventative foot care services. Complaint: Patient states" my nails have grown long and thick and become painful to walk and wear shoes" Patient has been diagnosed with DM with no foot complications. The patient presents for preventative foot care services. No changes to ROS.  Patient is taking eliquiss.  Podiatric Exam: Vascular: dorsalis pedis  pulses are palpable bilateral. Posterior tibial pulses are absent. Capillary return is immediate. Temperature gradient is WNL. Skin turgor WNL  Sensorium: Normal Semmes Weinstein monofilament test. Normal tactile sensation bilaterally. Nail Exam: Pt has thick disfigured discolored nails with subungual debris noted bilateral entire nail hallux through fifth toenails Ulcer Exam: There is no evidence of ulcer or pre-ulcerative changes or infection. Orthopedic Exam: Muscle tone and strength are WNL. No limitations in general ROM. No crepitus or effusions noted. Foot type and digits show no abnormalities. Bony prominences are unremarkable.Hammer toe second right. Skin: No Porokeratosis. No infection or ulcers  Diagnosis:  Onychomycosis, , Pain in right toe, pain in left toes  Treatment & Plan Procedures and Treatment: Consent by patient was obtained for treatment procedures. The patient understood the discussion of treatment and procedures well. All questions were answered thoroughly reviewed. Debridement of mycotic and hypertrophic toenails, 1 through 5 bilateral and clearing of subungual debris. No ulceration, no infection noted.  Return Visit-Office Procedure: Patient instructed to return to the office for a follow up visit 3 months for continued evaluation and treatment.  Gardiner Barefoot DPM

## 2019-05-08 ENCOUNTER — Other Ambulatory Visit: Payer: Self-pay

## 2019-05-08 ENCOUNTER — Ambulatory Visit (HOSPITAL_COMMUNITY): Payer: Medicare Other | Attending: Cardiovascular Disease

## 2019-05-08 DIAGNOSIS — I350 Nonrheumatic aortic (valve) stenosis: Secondary | ICD-10-CM | POA: Insufficient documentation

## 2019-06-12 ENCOUNTER — Ambulatory Visit: Payer: Medicare Other | Admitting: Internal Medicine

## 2019-06-15 ENCOUNTER — Institutional Professional Consult (permissible substitution): Payer: Medicare Other | Admitting: Neurology

## 2019-06-16 ENCOUNTER — Telehealth (INDEPENDENT_AMBULATORY_CARE_PROVIDER_SITE_OTHER): Payer: Medicare Other | Admitting: Internal Medicine

## 2019-06-16 ENCOUNTER — Encounter: Payer: Self-pay | Admitting: Internal Medicine

## 2019-06-16 VITALS — BP 111/78 | HR 57 | Wt 220.0 lb

## 2019-06-16 DIAGNOSIS — I350 Nonrheumatic aortic (valve) stenosis: Secondary | ICD-10-CM

## 2019-06-16 DIAGNOSIS — I480 Paroxysmal atrial fibrillation: Secondary | ICD-10-CM | POA: Diagnosis not present

## 2019-06-16 DIAGNOSIS — I351 Nonrheumatic aortic (valve) insufficiency: Secondary | ICD-10-CM

## 2019-06-16 DIAGNOSIS — R0602 Shortness of breath: Secondary | ICD-10-CM | POA: Diagnosis not present

## 2019-06-16 DIAGNOSIS — R5383 Other fatigue: Secondary | ICD-10-CM

## 2019-06-16 NOTE — Progress Notes (Signed)
Virtual Visit via Telephone Note   This visit type was conducted due to national recommendations for restrictions regarding the COVID-19 Pandemic (e.g. social distancing) in an effort to limit this patient's exposure and mitigate transmission in our community.  Due to his co-morbid illnesses, this patient is at least at moderate risk for complications without adequate follow up.  This format is felt to be most appropriate for this patient at this time.  The patient did not have access to video technology/had technical difficulties with video requiring transitioning to audio format only (telephone).  All issues noted in this document were discussed and addressed.  No physical exam could be performed with this format.  Please refer to the patient's chart for his  consent to telehealth for [facility].   Evaluation Performed:  Telephone follow-up  Date:  06/16/2019   ID:  [name], DOB 1940-06-02, MRN [id number]  Patient Location:  [id number] [place] [address]  Provider location:   [address] [place], [place] [address]  PCP:  Reynold Bowen, MD  Cardiologist:  Pixie Casino, MD Electrophysiologist:  None   Chief Complaint:  Fatigue, shortness of breath  History of Present Illness:    [name] is a 79 y.o. male who presents via audio/video conferencing for a telehealth visit today.  [name] returns today for follow-up.  I saw him last in January for fatigue and progressive shortness of breath on exertion.  I sent a number of labs which returned back normal including a normal CBC, unremarkable metabolic profile and very mildly elevated BNP at 150.  A repeat echo was performed which showed stable mild aortic stenosis with normal LVEF however there is mild to moderate aortic insufficiency.  He has a dilated ascending aorta to 4.2 cm.  This has been stable compared to an echo in 2017.  He also has moderate to severe biatrial enlargement.  It is not clear  whether the mild to moderate aortic insufficiency is contributing to his shortness of breath however it could be.  He is on losartan/HCTZ which provide some diuretic benefit.  In addition he has not had reassessment of his CPAP in some time.  Has been referred back to pulmonary for reevaluation.  This could be playing a role in his fatigue and shortness of breath as well.  The patient does not have symptoms concerning for COVID-19 infection (fever, chills, cough, or new SHORTNESS OF BREATH).    Prior CV studies:   The following studies were reviewed today:  Labs Echocardiogram  PMHx:  Past Medical History:  Diagnosis Date   Atrial fibrillation ([place])    Cholelithiasis 10/2011   Per CT. No cholecystitis radiographically.   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   Shortness of breath    exertion   Sleep apnea    cpap   Stroke [facility]) 2010/2013   Right Brain stroke    Past Surgical History:  Procedure Laterality Date   CARDIOVERSION  07/12/2011   Procedure: CARDIOVERSION;  Surgeon: Pixie Casino, MD;  Location: [facility] ENDOSCOPY;  Service: Cardiovascular;  Laterality: N/A;   CAROTID DOPPLER STUDY  01/31/2012   no significant extracranial carotid artery  stenosis   CATARACT EXTRACTION W/PHACO Left 01/30/2019   Procedure: CATARACT EXTRACTION PHACO AND INTRAOCULAR LENS PLACEMENT (IOC) (CDE: 4.76);  Surgeon: [name], MD;  Location: [facility];  Service: Ophthalmology;  Laterality: Left;   COLONOSCOPY     DOPPLER ECHOCARDIOGRAPHY  01/31/2012   EF 55-60%, moderately calcified annulus of the mitral valve, left atrium demonstrated mild-moderately dilated   KNEE SURGERY     TEE WITHOUT CARDIOVERSION  07/12/2011   Procedure:  TRANSESOPHAGEAL ECHOCARDIOGRAM (TEE);  Surgeon: Pixie Casino, MD;  Location: [facility] ENDOSCOPY;  Service: Cardiovascular;  Laterality: N/A;  to be done at 1330   TONSILLECTOMY      FAMHx:  Family History  Problem Relation Age of Onset   Diabetes Mother    Diabetes Son    Pancreatic cancer Other        GRANDFATHER   Colon cancer Other        INTESTINAL, GRANDMOTHER    SOCHx:   reports that he has never smoked. He has never used smokeless tobacco. He reports current alcohol use of about 2.0 standard drinks of alcohol per week. He reports that he does not use drugs.  ALLERGIES:  No Known Allergies  MEDS:  Current Meds  Medication Sig   apixaban (ELIQUIS) 5 MG TABS tablet Take 1 tablet (5 mg total) by mouth 2 (two) times daily.   carvedilol (COREG) 25 MG tablet Take 25 mg by mouth 2 (two) times daily with a meal.   Choline Fenofibrate (FENOFIBRIC ACID) 135 MG CPDR Take 135 mg by mouth daily.    escitalopram (LEXAPRO) 10 MG tablet Take 10 mg by mouth daily.   insulin glargine (LANTUS) 100 UNIT/ML injection Inject 26 Units into the skin at bedtime.   insulin lispro (HUMALOG) 100 UNIT/ML injection Inject 4-8 Units into the skin 3 (three) times daily with meals. Sliding Scale Insulin   losartan-hydrochlorothiazide (HYZAAR) 100-25 MG tablet Take 1 tablet by mouth daily.   mirabegron ER (MYRBETRIQ) 50 MG TB24 tablet Take 50 mg by mouth daily.   ONETOUCH VERIO test strip SMARTSIG:1 Strip(s) Via Meter 5 Times Daily   Polyethyl Glycol-Propyl Glycol (SYSTANE) 0.4-0.3 % SOLN Place 1-2 drops into both eyes 3 (three) times daily as needed (dry/irritated eyes.).   simvastatin (ZOCOR) 80 MG tablet Take 80 mg by mouth daily.   tiZANidine (ZANAFLEX) 2 MG tablet Take 2 mg by mouth every 6 (six) hours as needed for muscle spasms.     ROS: Pertinent items noted in HPI and remainder of comprehensive ROS otherwise negative.  Labs/Other Tests and Data Reviewed:    Recent  Labs: 04/28/2019: ALT 14; BNP 152.7; BUN 17; Creatinine, Ser 1.12; Hemoglobin 15.7; Platelets 177; Potassium 4.3; Sodium 144   Recent Lipid Panel Lab Results  Component Value Date/Time   CHOL 119 09/03/2013 05:40 AM   TRIG 105 09/03/2013 05:40 AM   HDL 47 09/03/2013 05:40 AM   CHOLHDL 2.5 09/03/2013 05:40 AM   LDLCALC 51 09/03/2013 05:40 AM    Wt Readings from Last 3 Encounters:  06/16/19 220 lb (99.8 kg)  04/28/19 218 lb 9.6 oz (99.2 kg)  01/14/19 220 lb (99.8 kg)     Exam:    Vital Signs:  BP 111/78    Pulse (!) 57    Wt 220 lb (99.8 kg)    BMI 33.45 kg/m    Exam not performed due to telephone visit  ASSESSMENT & PLAN:    1. Dyspnea on exertion/fatigue 2. Aortic stenosis 3. Aortic  insufficiency 4. PAF 5. Type 2 diabetes 6. Mild obesity 7. History of stroke 8. Hypertension 9. OSA on CPAP  Mr. Austerman still has some dyspnea on exertion and progressive fatigue.  I am not certain that his mild to moderate aortic insufficiency on echo really explains the shortness of breath.  He is on low-dose thiazide.  Ultimately if this persists we could consider a loop diuretic instead.  His aortic stenosis is just mild.  EF is normal.  He may need adjustment in his CPAP and does have an upcoming pulmonary appointment.  Blood pressures well controlled.  Weight has been stable.  He does have a history of PAF with moderate to severe atrial enlargement and was noted to be in A. fib recently.  This appears to be longstanding persistent if not permanent looking back to EKGs last in 2019.  Plan follow-up with me in 3 months.  COVID-19 Education: The signs and symptoms of COVID-19 were discussed with the patient and how to seek care for testing (follow up with PCP or arrange E-visit).  The importance of social distancing was discussed today.  Patient Risk:   After full review of this patients clinical status, I feel that they are at least moderate risk at this time.  Time:   Today, I have  spent 15 minutes with the patient with telehealth technology discussing dyspnea, fatigue, echo findings and lab work.     Medication Adjustments/Labs and Tests Ordered: Current medicines are reviewed at length with the patient today.  Concerns regarding medicines are outlined above.   Tests Ordered: No orders of the defined types were placed in this encounter.   Medication Changes: No orders of the defined types were placed in this encounter.   Disposition:  in 3 month(s)  Pixie Casino, MD, [facility] [place] [facility] Director of the Advanced Lipid Disorders &  Cardiovascular Risk Reduction Clinic Diplomate of the American Board of Clinical Lipidology Attending Cardiologist  Direct Dial: [phone]   Fax: [phone]  Website:  [email or web address]  Pixie Casino, MD  06/16/2019 8:36 AM

## 2019-06-16 NOTE — Patient Instructions (Signed)
Medication Instructions:  Your physician recommends that you continue on your current medications as directed. Please refer to the Current Medication list given to you today.  *If you need a refill on your cardiac medications before your next appointment, please call your pharmacy*   Follow-Up: At [facility], you and your health needs are our priority.  As part of our continuing mission to provide you with exceptional heart care, we have created designated Provider Care Teams.  These Care Teams include your primary Cardiologist (physician) and Advanced Practice Providers (APPs -  Physician Assistants and Nurse Practitioners) who all work together to provide you with the care you need, when you need it.  We recommend signing up for the patient portal called "MyChart".  Sign up information is provided on this After Visit Summary.  MyChart is used to connect with patients for Virtual Visits (Telemedicine).  Patients are able to view lab/test results, encounter notes, upcoming appointments, etc.  Non-urgent messages can be sent to your provider as well.   To learn more about what you can do with MyChart, go to [email or web address].    Your next appointment:   3 month(s)  The format for your next appointment:   In Person  Provider:   You may see Kenneth C Hilty, MD or one of the following Advanced Practice Providers on your designated Care Team:    Hao Meng, PA-C  Angela Duke, PA-C or   Krista Kroeger, PA-C    Other Instructions   

## 2019-07-06 ENCOUNTER — Ambulatory Visit: Payer: Medicare Other | Admitting: Neurology

## 2019-07-06 ENCOUNTER — Other Ambulatory Visit: Payer: Self-pay

## 2019-07-06 ENCOUNTER — Encounter: Payer: Self-pay | Admitting: Neurology

## 2019-07-06 VITALS — BP 149/84 | HR 73

## 2019-07-06 DIAGNOSIS — Z8673 Personal history of transient ischemic attack (TIA), and cerebral infarction without residual deficits: Secondary | ICD-10-CM | POA: Diagnosis not present

## 2019-07-06 DIAGNOSIS — I482 Chronic atrial fibrillation, unspecified: Secondary | ICD-10-CM | POA: Diagnosis not present

## 2019-07-06 DIAGNOSIS — G4733 Obstructive sleep apnea (adult) (pediatric): Secondary | ICD-10-CM

## 2019-07-06 DIAGNOSIS — G4731 Primary central sleep apnea: Secondary | ICD-10-CM | POA: Diagnosis not present

## 2019-07-06 NOTE — Patient Instructions (Addendum)
Thank you for choosing [facility] for your sleep related care! It was nice to meet you today!  Based on your symptoms and your exam I believe you are still at risk for obstructive sleep apnea and would benefit from reevaluation as it has been many years and you need new supplies and updated machine. Therefore, I think we should proceed with a sleep study to determine how severe your sleep apnea is. If you have more than mild OSA, I want you to consider ongoing treatment with CPAP. Please remember, the risks and ramifications of moderate to severe obstructive sleep apnea or OSA are: Cardiovascular disease, including congestive heart failure, stroke, difficult to control hypertension, arrhythmias, and even type 2 diabetes has been linked to untreated OSA. Sleep apnea causes disruption of sleep and sleep deprivation in most cases, which, in turn, can cause recurrent headaches, problems with memory, mood, concentration, focus, and vigilance. Most people with untreated sleep apnea report excessive daytime sleepiness, which can affect their ability to drive. Please do not drive if you feel sleepy.   I will likely see you back after your sleep study to go over the test results and where to go from there. We will call you after your sleep study to advise about the results (most likely, you will hear from Micco, my nurse) and to set up an appointment at the time, as necessary.    Our sleep lab administrative assistant will call you to schedule your sleep study. If you don't hear back from her by about 2 weeks from now, please feel free to call her at [phone]. You can leave a message with your phone number and concerns, if you get the voicemail box. She will call back as soon as possible.

## 2019-07-06 NOTE — Progress Notes (Signed)
Subjective:    Patient ID: BRAUN BARNO is a 79 y.o. male.  HPI     History:  Dear Dr. Forde Dandy,   I saw your patient, Pio Stadtlander, upon your kind request of sleep clinic today for initial consultation of his sleep disorder, in particular, re-evaluation of his prior diagnosis of obstructive sleep apnea.  The patient is accompanied by his wife today.  As you know, Mr. Norwick is a 79 year old right-handed gentleman with with an underlying medical history of stroke, [facility] spotted fever, hypertension, hyperlipidemia, reflux disease, diabetes, neuropathy, aortic stenosis, diastolic dysfunction, depression, carotid artery disease, history of gallstones, strokes, atrial fibrillation, and obesity, who was previously diagnosed with obstructive sleep apnea and placed on PAP therapy.  I reviewed your telemedicine note from 06/02/2019.  His Epworth sleepiness score is 13/24, fatigue severity score is 51/63. Prior sleep study results are not available for my review today.  His BiPAP compliance data was reviewed today, from The last 30 days from 06/06/2019 through 07/05/2019, during which time he used his machine 29 days with percent use days greater than 4 hours at 67%, indicating slightly suboptimal compliance with an average usage of 6 hours and 48 minutes, residual AHI elevated at 10.9/h, leak rather high with a 95th percentile at 80.4 L/min on a BiPAP of 18/14.  His last 90 days look better as far as his compliance, average usage of 8 hours and 7 minutes, percent use days greater than 4 hours at 81%, leak high but better than recently an average AHI of 10.8/h, mostly secondary to central apneas.  He had seen Dr. Jim Like in this office after his stroke in 2015. He lives with his wife, his son also has sleep apnea.  They have a small dog in the household.  His bedtime is generally 8 and 10 PM, rise time between 8 and 9 AM.  He does not have night to night nocturia.  He has had problems with his headgear  sliding off.  His sleep study testing was over 5 years ago per wife.  He has trouble walking and uses a cane.  He reports having benefited from his machine.  He would be willing to get retested and get new equipment.  He is a non-smoker.  He drinks alcohol very occasionally, he drinks caffeine in the form of diet soda, about 3 glasses/day, 1 cup of coffee in the morning typically.  His Past Medical History Is Significant For: Past Medical History:  Diagnosis Date  . Atrial fibrillation ([place])   . Cholelithiasis 10/2011   Per CT. No cholecystitis radiographically.  . 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 Depression   . Diabetes mellitus   . GERD (gastroesophageal reflux disease)   . Hemorrhoids 2008  . Hyperlipidemia   . Hypertension   . Obesity   . 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  . Shortness of breath    exertion  . Sleep apnea    cpap  . Stroke [facility]) 2010/2013   Right Brain stroke    His Past Surgical History Is Significant For: Past Surgical History:  Procedure Laterality Date  . CARDIOVERSION  07/12/2011   Procedure: CARDIOVERSION;  Surgeon: Pixie Casino, MD;  [facility] ENDOSCOPY;  Service: Cardiovascular;  Laterality: N/A;  . CAROTID DOPPLER STUDY  01/31/2012   no significant extracranial carotid artery stenosis  . CATARACT EXTRACTION W/PHACO Left 01/30/2019   Procedure: CATARACT EXTRACTION  PHACO AND INTRAOCULAR LENS PLACEMENT (IOC) (CDE: 4.76);  Surgeon: [name], MD;  Location: [facility] ORS;  Service: Ophthalmology;  Laterality: Left;  . COLONOSCOPY    . DOPPLER ECHOCARDIOGRAPHY  01/31/2012   EF 55-60%, moderately calcified annulus of the mitral valve, left atrium demonstrated mild-moderately dilated  . KNEE SURGERY    . TEE WITHOUT CARDIOVERSION  07/12/2011   Procedure: TRANSESOPHAGEAL ECHOCARDIOGRAM (TEE);  Surgeon: Pixie Casino, MD;  Location: [facility] ENDOSCOPY;  Service: Cardiovascular;   Laterality: N/A;  to be done at 1330  . TONSILLECTOMY      His Family History Is Significant For: Family History  Problem Relation Age of Onset  . Diabetes Mother   . Diabetes Son   . Pancreatic cancer Other        GRANDFATHER  . Colon cancer Other        INTESTINAL, GRANDMOTHER    His Social History Is Significant For: Social History   Socioeconomic History  . Marital status: Married    Spouse name: Earlie Server   . Number of children: 2  . Years of education: 12+  . Highest education level: Not on file  Occupational History    Employer: RETIRED  Tobacco Use  . Smoking status: Never Smoker  . Smokeless tobacco: Never Used  Substance and Sexual Activity  . Alcohol use: Yes    Alcohol/week: 2.0 standard drinks    Types: 2 Glasses of wine per week    Comment: occasionally   . Drug use: No  . Sexual activity: Never  Other Topics Concern  . Not on file  Social History Narrative   Patient lives at home wife Earlie Server. .    Patient has 2 children.    Patient is right handed.    Patient is retired.    Patient has some college.          Social Determinants of Health   Financial Resource Strain:   . Difficulty of Paying Living Expenses:   Food Insecurity:   . Worried About Charity fundraiser in the Last Year:   . Arboriculturist in the Last Year:   Transportation Needs:   . Film/video editor (Medical):   Marland Kitchen Lack of Transportation (Non-Medical):   Physical Activity:   . Days of Exercise per Week:   . Minutes of Exercise per Session:   Stress:   . Feeling of Stress :   Social Connections:   . Frequency of Communication with Friends and Family:   . Frequency of Social Gatherings with Friends and Family:   . Attends Religious Services:   . Active Member of Clubs or Organizations:   . Attends Archivist Meetings:   Marland Kitchen Marital Status:     His Allergies Are:  No Known Allergies:   His Current Medications Are:  Outpatient Encounter Medications as of  07/06/2019  Medication Sig  . apixaban (ELIQUIS) 5 MG TABS tablet Take 1 tablet (5 mg total) by mouth 2 (two) times daily.  . carvedilol (COREG) 25 MG tablet Take 25 mg by mouth 2 (two) times daily with a meal.  . Choline Fenofibrate (FENOFIBRIC ACID) 135 MG CPDR Take 135 mg by mouth daily.   Marland Kitchen escitalopram (LEXAPRO) 10 MG tablet Take 10 mg by mouth daily.  . insulin glargine (LANTUS) 100 UNIT/ML injection Inject 26 Units into the skin at bedtime.  . insulin lispro (HUMALOG) 100 UNIT/ML injection Inject 4-8 Units into the skin 3 (three) times daily with meals.  Sliding Scale Insulin  . losartan-hydrochlorothiazide (HYZAAR) 100-25 MG tablet Take 1 tablet by mouth daily.  . mirabegron ER (MYRBETRIQ) 50 MG TB24 tablet Take 50 mg by mouth daily.  Glory Rosebush VERIO test strip SMARTSIG:1 Strip(s) Via Meter 5 Times Daily  . Polyethyl Glycol-Propyl Glycol (SYSTANE) 0.4-0.3 % SOLN Place 1-2 drops into both eyes 3 (three) times daily as needed (dry/irritated eyes.).  Marland Kitchen simvastatin (ZOCOR) 80 MG tablet Take 80 mg by mouth daily.  Marland Kitchen tiZANidine (ZANAFLEX) 2 MG tablet Take 2 mg by mouth every 6 (six) hours as needed for muscle spasms.   No facility-administered encounter medications on file as of 07/06/2019.  :  Review of Systems:  Out of a complete 14 point review of systems, all are reviewed and negative with the exception of these symptoms as listed below: Review of Systems  Neurological:       Pt presents today to discuss his cpap. Pt's cpap is greater than 47 years old. Pt is using [facility]. Pt is interested in a new cpap.  Epworth Sleepiness Scale 0= would never doze 1= slight chance of dozing 2= moderate chance of dozing 3= high chance of dozing  Sitting and reading: 2 Watching TV: 2 Sitting inactive in a public place (ex. Theater or meeting): 0 As a passenger in a car for an hour without a break: 3 Lying down to rest in the afternoon: 3 Sitting and talking to someone: 2 Sitting quietly after lunch  (no alcohol): 1 In a car, while stopped in traffic: 0 Total: 13     Objective:  Neurological Exam  Physical Exam Physical Examination:   Vitals:   07/06/19 1323  BP: (!) 149/84  Pulse: 73   General Examination: The patient is a very pleasant 79 y.o. male in no acute distress. He appears well-developed and well-nourished and well groomed.   HEENT: Normocephalic, atraumatic, pupils are equal, round and reactive to light, extraocular tracking is good without limitation to gaze excursion or nystagmus noted. Hearing is impaired. Face is symmetric with normal facial animation. Speech is clear with no dysarthria noted. There is no hypophonia. There is no lip, neck/head, jaw or voice tremor. Neck is supple with full range of passive and active motion. There are no carotid bruits on auscultation. Oropharynx exam reveals: mild mouth dryness, adequate dental hygiene and moderate airway crowding, due to smaller airway entry, thicker soft palate. Tongue protrudes centrally and palate elevates symmetrically. Tonsils are absent.   Chest: Clear to auscultation without wheezing, rhonchi or crackles noted.  Heart: S1+S2+0, irregularly irregular, no murmurs, rubs or gallops noted.   Abdomen: Soft, non-tender and non-distended with normal bowel sounds appreciated on auscultation.  Extremities: There is no pitting edema in the distal lower extremities bilaterally.   Skin: Warm and dry without trophic changes noted.   Musculoskeletal: exam reveals no obvious joint deformities, tenderness or joint swelling or erythema.   Neurologically:  Mental status: The patient is awake, alert, but has difficulty relating his history. His wife provides most of the history. Thought process is linear. Mood is normal and affect is normal.  Cranial nerves II - XII are as described above under HEENT exam.  Motor exam: Normal bulk, strength and tone is noted. There is no tremor, fine motor skills and coordination: mildly  implaired overall.   Cerebellar testing: No dysmetria or intention tremor. There is no truncal or gait ataxia.  Sensory exam: intact to light touch in the upper and lower extremities.   Assessment  and plan:   In summary, KOBEY LICHTENFELS is a very pleasant 79 y.o.-year old male with an underlying medical history of stroke, [facility] spotted fever, hypertension, hyperlipidemia, reflux disease, diabetes, neuropathy, aortic stenosis, diastolic dysfunction, depression, carotid artery disease, history of gallstones, strokes, atrial fibrillation, and obesity, who presents for evaluation of his sleep apnea.  I had a long chat with the patient and his wife about my findings and the diagnosis of OSA, its prognosis and treatment options.  Of note, he has been on BiPAP at a fairly high pressure.  He has residual sleep disordered breathing with a residual AHI of around 10/h with a primary central component at times but not consistently, unclear if he had primary central sleep apnea or mixed sleep apnea or primary obstructive sleep apnea in the past.  We will proceed with diagnostic testing in the form of a nocturnal polysomnogram.  He is advised that he may need to come back for a second sleep study for treatment purposes.  We may have to change his treatment settings on modality.  He uses a full facemask.  He is compliant with treatment and commended for this.  He is agreeable to coming in for sleep study.  I explained the sleep test procedure to the patient. I answered all their questions today and the patient and his wife were in agreement. I plan to see him back after the sleep study is completed and encouraged him to call with any interim questions, concerns, problems or updates.   Thank you very much for allowing me to participate in the care of this nice patient. If I can be of any further assistance to you please do not hesitate to call me at [phone].  Sincerely,   Star Age, MD, PhD

## 2019-07-29 ENCOUNTER — Other Ambulatory Visit: Payer: Self-pay

## 2019-07-29 ENCOUNTER — Ambulatory Visit (INDEPENDENT_AMBULATORY_CARE_PROVIDER_SITE_OTHER): Payer: Medicare Other | Admitting: Neurology

## 2019-07-29 DIAGNOSIS — I482 Chronic atrial fibrillation, unspecified: Secondary | ICD-10-CM

## 2019-07-29 DIAGNOSIS — G4733 Obstructive sleep apnea (adult) (pediatric): Secondary | ICD-10-CM | POA: Diagnosis not present

## 2019-07-29 DIAGNOSIS — G4731 Primary central sleep apnea: Secondary | ICD-10-CM

## 2019-07-29 DIAGNOSIS — Z8673 Personal history of transient ischemic attack (TIA), and cerebral infarction without residual deficits: Secondary | ICD-10-CM

## 2019-08-04 ENCOUNTER — Telehealth: Payer: Self-pay

## 2019-08-04 NOTE — Progress Notes (Signed)
Patient referred by Dr. Forde Dandy for re-eval of patient's OSA, has been on BiPAP of 18/14 cm with residual sleep apnea, including centrals. He had a HST on 07/29/19:  Please call and notify the patient that the recent home sleep test did show severe obstructive sleep apnea and that I recommend treatment for this in the form of PAP or BiPAP. I will request an overnight sleep study for proper titration and mask fitting. I have placed an order in the chart.  Star Age, MD, PhD [facility])

## 2019-08-04 NOTE — Telephone Encounter (Signed)
I called pt. No answer, left a message asking pt to call me back.   

## 2019-08-04 NOTE — Addendum Note (Signed)
Addended by: Star Age on: 08/04/2019 07:59 AM   Modules accepted: Orders

## 2019-08-04 NOTE — Telephone Encounter (Addendum)
I called pt and spoke with his wife ( ok per dpr). I advised pt that Dr. Rexene Alberts reviewed their sleep study results and found that severe osa was noted and recommends that pt be treated with pap or bipap. Dr. Rexene Alberts recommends that pt return for a repeat sleep study in order to properly titrate the machine and ensure a good mask fit. Pt is agreeable to returning for a titration study. I advised pt that our sleep lab will file with pt's insurance and call pt to schedule the sleep study when we hear back from the pt's insurance regarding coverage of this sleep study. Pt verbalized understanding of results. Pt had no questions at this time but was encouraged to call back if questions arise.

## 2019-08-04 NOTE — Telephone Encounter (Signed)
I reached out to the pt's wife and left a message asking her to call back.

## 2019-08-04 NOTE — Telephone Encounter (Signed)
-----   Message from Star Age, MD sent at 08/04/2019  7:59 AM EDT ----- Patient referred by Dr. Forde Dandy for re-eval of patient's OSA, has been on BiPAP of 18/14 cm with residual sleep apnea, including centrals. He had a HST on 07/29/19:  Please call and notify the patient that the recent home sleep test did show severe obstructive sleep apnea and that I recommend treatment for this in the form of PAP or BiPAP. I will request an overnight sleep study for proper titration and mask fitting. I have placed an order in the chart.  Star Age, MD, PhD [facility])

## 2019-08-04 NOTE — Procedures (Signed)
Patient Information     First Name: [name] Last Name: [facility] ID: [id number]  Birth Date: [birth year] Age: 79 Gender: Male  Referring Provider: Reynold Bowen, MD BMI: 33.4 (W=220 lb, H=5' 8'')    Epworth:  13/24   Sleep Study Information    Study Date: Jul 29, 2019 S/H/A Version: [id number] / 4.1.1528 / 63  History:    79 year old man with a history of stroke, [facility] spotted fever, hypertension, hyperlipidemia, reflux disease, diabetes, neuropathy, aortic stenosis, diastolic dysfunction, depression, carotid artery disease, history of gallstones, strokes, atrial fibrillation, and obesity, who was previously diagnosed with obstructive sleep apnea and placed on PAP therapy. He is on BiPAP therapy, but has had an elevated AHI, including central apneas.  Summary & Diagnosis:     Severe OSA Recommendations:     This home sleep test demonstrates severe obstructive sleep apnea with a total AHI of 78.2/hour and O2 nadir of 83%. Given the patient's medical history and sleep related complaints, and residual sleep disordered breathing when using his current BiPAP of 18/14 cm, he will be advised to return for a full night PAP titration study for proper treatment settings, O2 monitoring and mask fitting. Based on the severity of the sleep disordered breathing an attended titration study is indicated. He can continue using his current machine for now. Please note that untreated obstructive sleep apnea may carry additional perioperative morbidity. Patients with significant obstructive sleep apnea should receive perioperative PAP therapy and the surgeons and particularly the anesthesiologist should be informed of the diagnosis and the severity of the sleep disordered breathing. The patient should be cautioned not to drive, work at heights, or operate dangerous or heavy equipment when tired or sleepy. Review and reiteration of good sleep hygiene measures should be pursued with any patient. Other causes of the  patient's symptoms, including circadian rhythm disturbances, an underlying mood disorder, medication effect and/or an underlying medical problem cannot be ruled out based on this test. Clinical correlation is recommended. The patient and his referring provider will be notified of the test results. The patient will be seen in follow up in sleep clinic at [facility].  I certify that I have reviewed the raw data recording prior to the issuance of this report in accordance with the standards of the American Academy of Sleep Medicine (AASM).  Star Age, MD, PhD [facility]) Diplomat, ABPN (Neurology and Sleep)              Start Study Time: End Study Time: Total Recording Time:  9:46:00 PM 6:54:21 AM         9 h, 8 min  Total Sleep Time % REM of Sleep Time:  6 h, 40 min  5.0    Mean: 94 Minimum: 83 Maximum: 99  Mean of Desaturations Nadirs (%):   90  Oxygen Desaturation. %:   4-9 10-20 >20 Total  Events Number Total   257  57 81.8 18.2  0 0.0  314 100.0  Oxygen Saturation: <90 <=88 <85 <80 <70  Duration (minutes): Sleep % 10.7 2.7  3.9 0.0  1.0 0.0 0.0 0.0 0.0 0.0     Respiratory Indices       Total Events REM NREM All Night  pRDI: pAHI: ODI:  420  420  314 N/A N/A N/A N/A N/A N/A 78.2 78.2 58.5       Pulse Rate Statistics during Sleep (BPM)      Mean: 75 Minimum: 38 Maximum:  122    Oxygen Saturation Statistics   Sleep Summary   Indices are calculated using technically valid sleep time of  5 hrs, 22 min. pRDI/pAHI are calculated using oxi desaturations ? 3%  REM/NREM indices appear only if REM time > 30 min                 Sleep Stages Chart                                                                             pAHI=78.2                                                                                              Mild              Moderate                    Severe                                                  5              15                    30

## 2019-08-04 NOTE — Telephone Encounter (Signed)
Pt's wife Earlie Server left a message returning your call. She can be reached at [phone]. Please advise.

## 2019-08-05 ENCOUNTER — Ambulatory Visit: Payer: Medicare Other | Admitting: Podiatry

## 2019-08-07 ENCOUNTER — Encounter: Payer: Self-pay | Admitting: Podiatry

## 2019-08-07 ENCOUNTER — Ambulatory Visit: Payer: Medicare Other | Admitting: Podiatry

## 2019-08-07 ENCOUNTER — Other Ambulatory Visit: Payer: Self-pay

## 2019-08-07 DIAGNOSIS — M79676 Pain in unspecified toe(s): Secondary | ICD-10-CM

## 2019-08-07 DIAGNOSIS — E119 Type 2 diabetes mellitus without complications: Secondary | ICD-10-CM | POA: Diagnosis not present

## 2019-08-07 DIAGNOSIS — B351 Tinea unguium: Secondary | ICD-10-CM | POA: Diagnosis not present

## 2019-08-07 NOTE — Patient Instructions (Signed)

## 2019-08-11 ENCOUNTER — Telehealth: Payer: Self-pay

## 2019-08-11 NOTE — Telephone Encounter (Signed)
LVM for pt to call me back to schedule sleep study  

## 2019-08-12 ENCOUNTER — Ambulatory Visit: Payer: Medicare Other | Admitting: Podiatry

## 2019-08-12 NOTE — Telephone Encounter (Signed)
Pt's wife(Dorothy Whinery on [place]) is asking for a call from [facility] to discuss again what was discussed on 05-04 re: a sleep study that pt would have to come into the office for.  Please call

## 2019-08-14 NOTE — Progress Notes (Signed)
Subjective: [name] is a 79 y.o. male patient seen today preventative diabetic foot care and painful mycotic nails b/l that are difficult to trim. Pain interferes with ambulation. Aggravating factors include wearing enclosed shoe gear. Pain is relieved with periodic professional debridement.  He voices no new pedal problems on today's visit.  Patient Active Problem List   Diagnosis Date Noted  . Essential hypertension 11/18/2017  . SIRS (systemic inflammatory response syndrome) ([place]) 08/17/2017  . Aortic stenosis 04/27/2014  . PAF (paroxysmal atrial fibrillation) ([place]) 09/15/2013  . Anticoagulation adequate 09/15/2013  . Febrile illness, acute 10/26/2011  . Cough 10/26/2011  . DM type 2 (diabetes mellitus, type 2) ([place]) 05/15/2011  . Hyperlipemia 05/15/2011  . Obesity 05/15/2011  . Benign hypertension 05/15/2011  . H/O: stroke 05/15/2011    Current Outpatient Medications on File Prior to Visit  Medication Sig Dispense Refill  . apixaban (ELIQUIS) 5 MG TABS tablet Take 1 tablet (5 mg total) by mouth 2 (two) times daily. 180 tablet 3  . carvedilol (COREG) 25 MG tablet Take 25 mg by mouth 2 (two) times daily with a meal.    . Choline Fenofibrate (FENOFIBRIC ACID) 135 MG CPDR Take 135 mg by mouth daily.     Marland Kitchen escitalopram (LEXAPRO) 10 MG tablet Take 10 mg by mouth daily.    . insulin glargine (LANTUS) 100 UNIT/ML injection Inject 26 Units into the skin at bedtime.    . insulin lispro (HUMALOG) 100 UNIT/ML injection Inject 4-8 Units into the skin 3 (three) times daily with meals. Sliding Scale Insulin    . losartan-hydrochlorothiazide (HYZAAR) 100-25 MG tablet Take 1 tablet by mouth daily.  6  . mirabegron ER (MYRBETRIQ) 50 MG TB24 tablet Take 50 mg by mouth daily.    Glory Rosebush VERIO test strip SMARTSIG:1 Strip(s) Via Meter 5 Times Daily    . Polyethyl Glycol-Propyl Glycol (SYSTANE) 0.4-0.3 % SOLN Place 1-2 drops into both eyes 3 (three) times daily as needed (dry/irritated eyes.).     Marland Kitchen simvastatin (ZOCOR) 80 MG tablet Take 80 mg by mouth daily.    . tizanidine (ZANAFLEX) 2 MG capsule Take 4 mg by mouth every 8 (eight) hours as needed.    Marland Kitchen tiZANidine (ZANAFLEX) 2 MG tablet Take 2 mg by mouth every 6 (six) hours as needed for muscle spasms.     No current facility-administered medications on file prior to visit.    No Known Allergies  Objective: Physical Exam  General: [name] is a pleasant 79 y.o. y.o. Caucasian male, in NAD. AAO x 3.   Vascular:  Neurovascular status unchanged b/l. Capillary refill time to digits immediate b/l. Palpable DP pulses b/l. Nonpalpable PT pulses b/l. Pedal hair present b/l. Skin temperature gradient within normal limits b/l.  Dermatological:  Pedal skin with normal turgor, texture and tone bilaterally. No open wounds bilaterally. No interdigital macerations bilaterally. Toenails 1-5 b/l elongated, dystrophic, thickened, crumbly with subungual debris and tenderness to dorsal palpation.  Musculoskeletal:  Normal muscle strength 5/5 to all lower extremity muscle groups bilaterally. No gross bony deformities bilaterally. No pain crepitus or joint limitation noted with ROM b/l.  Neurological:  Protective sensation intact 5/5 intact bilaterally with 10g monofilament b/l. Vibratory sensation intact b/l.  Assessment and Plan:  1. Pain due to onychomycosis of toenail   2. Diabetes mellitus without complication ([place])    -Examined patient. -No new findings. No new orders. -Continue diabetic foot care principles. Literature dispensed on today.  -Toenails 1-5 b/l  were debrided in length and girth with sterile nail nippers and dremel without iatrogenic bleeding.  -Patient to continue soft, supportive shoe gear daily. -Patient to report any pedal injuries to medical professional immediately. -Patient/POA to call should there be question/concern in the interim.  Return in about 3 months (around 11/07/2019).  Marzetta Board, DPM

## 2019-08-25 ENCOUNTER — Telehealth: Payer: Self-pay

## 2019-08-25 NOTE — Telephone Encounter (Signed)
Pt's wife has called me back stating that they just cannot afford this at this time. They will call back at later date to schedule when able to.

## 2019-08-25 NOTE — Telephone Encounter (Signed)
LVM for pt to call me back to schedule sleep study  

## 2019-09-16 ENCOUNTER — Ambulatory Visit: Payer: Medicare Other | Admitting: Physician Assistant

## 2019-11-04 ENCOUNTER — Ambulatory Visit: Payer: Medicare Other | Admitting: Podiatry

## 2019-11-06 ENCOUNTER — Other Ambulatory Visit: Payer: Self-pay

## 2019-11-06 ENCOUNTER — Ambulatory Visit: Payer: Medicare Other | Admitting: Podiatry

## 2019-11-06 ENCOUNTER — Encounter: Payer: Self-pay | Admitting: Podiatry

## 2019-11-06 DIAGNOSIS — M79676 Pain in unspecified toe(s): Secondary | ICD-10-CM

## 2019-11-06 DIAGNOSIS — B351 Tinea unguium: Secondary | ICD-10-CM

## 2019-11-06 DIAGNOSIS — D689 Coagulation defect, unspecified: Secondary | ICD-10-CM | POA: Diagnosis not present

## 2019-11-06 DIAGNOSIS — E119 Type 2 diabetes mellitus without complications: Secondary | ICD-10-CM

## 2019-11-06 NOTE — Progress Notes (Signed)
Patient ID: [name], male   DOB: [birth year], 79 y.o.   MRN: [id number] Complaint:  Visit Type: Patient returns to my office for continued preventative foot care services. Complaint: Patient states" my nails have grown long and thick and become painful to walk and wear shoes" Patient has been diagnosed with DM with no foot complications. The patient presents for preventative foot care services. No changes to ROS.  Patient is taking eliquiss.  He presents to the office with his wife.  They are concerned about purplish discoloration on left lower leg.  The wife says it has occurred once he stopped wearing his compression dressing.   She says his medical doctor suggested she be seen by dermatologist.  Podiatric Exam: Vascular: dorsalis pedis  pulses are palpable bilateral. Posterior tibial pulses are absent. Capillary return is immediate. Temperature gradient is WNL. Skin turgor WNL  Mild swelling noted feet  B/L. Sensorium: Normal Semmes Weinstein monofilament test. Normal tactile sensation bilaterally. Nail Exam: Pt has thick disfigured discolored nails with subungual debris noted bilateral entire nail hallux through fifth toenails Ulcer Exam: There is no evidence of ulcer or pre-ulcerative changes or infection. Orthopedic Exam: Muscle tone and strength are WNL. No limitations in general ROM. No crepitus or effusions noted. Foot type and digits show no abnormalities. Bony prominences are unremarkable.Hammer toe second right. Skin: No Porokeratosis. No infection or ulcers  Diagnosis:  Onychomycosis, , Pain in right toe, pain in left toes  Treatment & Plan Procedures and Treatment: Consent by patient was obtained for treatment procedures. The patient understood the discussion of treatment and procedures well. All questions were answered thoroughly reviewed. Debridement of mycotic and hypertrophic toenails, 1 through 5 bilateral and clearing of subungual debris. No ulceration, no infection noted.  Told  her to use cortaid for discoloration.  Explained the benefit of compression socks for this patient. Return Visit-Office Procedure: Patient instructed to return to the office for a follow up visit 3 months for continued evaluation and treatment.  Gardiner Barefoot DPM

## 2020-03-11 ENCOUNTER — Ambulatory Visit: Payer: Medicare Other | Admitting: Podiatry

## 2020-07-05 ENCOUNTER — Ambulatory Visit: Payer: Medicare Other | Admitting: Podiatry

## 2020-07-12 ENCOUNTER — Ambulatory Visit: Payer: Medicare Other | Admitting: Podiatry

## 2020-07-29 ENCOUNTER — Ambulatory Visit: Payer: Medicare Other | Admitting: Podiatry

## 2020-07-29 ENCOUNTER — Other Ambulatory Visit: Payer: Self-pay

## 2020-07-29 DIAGNOSIS — Z7901 Long term (current) use of anticoagulants: Secondary | ICD-10-CM

## 2020-07-29 DIAGNOSIS — E119 Type 2 diabetes mellitus without complications: Secondary | ICD-10-CM

## 2020-07-29 DIAGNOSIS — D689 Coagulation defect, unspecified: Secondary | ICD-10-CM | POA: Diagnosis not present

## 2020-07-29 DIAGNOSIS — B351 Tinea unguium: Secondary | ICD-10-CM | POA: Diagnosis not present

## 2020-07-29 DIAGNOSIS — M79676 Pain in unspecified toe(s): Secondary | ICD-10-CM | POA: Diagnosis not present

## 2020-07-29 NOTE — Progress Notes (Signed)
Patient ID: [name], male   DOB: 20-Mar-1941, 80 y.o.   MRN: [id number] Complaint:  Visit Type: Patient returns to my office for continued preventative foot care services. Complaint: Patient states" my nails have grown long and thick and become painful to walk and wear shoes" Patient has been diagnosed with DM with no foot complications. The patient presents for preventative foot care services. No changes to ROS.  Patient is taking eliquiss.  He presents to the office with his wife.    Podiatric Exam: Vascular: dorsalis pedis  pulses and posterior pulses are weakly palpable  Bilateral.. Capillary return is immediate. Cold feet. Skin turgor WNL  Absent digital hair  B/L  Mild swelling noted feet  B/L. Sensorium: Normal Semmes Weinstein monofilament test. Normal tactile sensation bilaterally. Nail Exam: Pt has thick disfigured discolored nails with subungual debris noted bilateral entire nail hallux through fifth toenails Ulcer Exam: There is no evidence of ulcer or pre-ulcerative changes or infection. Orthopedic Exam: Muscle tone and strength are WNL. No limitations in general ROM. No crepitus or effusions noted. Foot type and digits show no abnormalities. Bony prominences are unremarkable.Hammer toe second right. Skin: No Porokeratosis. No infection or ulcers  Diagnosis:  Onychomycosis, , Pain in right toe, pain in left toes  Treatment & Plan Procedures and Treatment: Consent by patient was obtained for treatment procedures. The patient understood the discussion of treatment and procedures well. All questions were answered thoroughly reviewed. Debridement of mycotic and hypertrophic toenails, 1 through 5 bilateral and clearing of subungual debris. No ulceration, no infection noted.   Return Visit-Office Procedure: Patient instructed to return to the office for a follow up visit 3 months for continued evaluation and treatment.  Gardiner Barefoot DPM

## 2020-10-28 ENCOUNTER — Encounter: Payer: Self-pay | Admitting: Podiatry

## 2020-10-28 ENCOUNTER — Ambulatory Visit: Payer: Medicare Other | Admitting: Podiatry

## 2020-10-28 ENCOUNTER — Other Ambulatory Visit: Payer: Self-pay

## 2020-10-28 DIAGNOSIS — Z7901 Long term (current) use of anticoagulants: Secondary | ICD-10-CM

## 2020-10-28 DIAGNOSIS — E119 Type 2 diabetes mellitus without complications: Secondary | ICD-10-CM | POA: Diagnosis not present

## 2020-10-28 DIAGNOSIS — B351 Tinea unguium: Secondary | ICD-10-CM | POA: Diagnosis not present

## 2020-10-28 DIAGNOSIS — M79676 Pain in unspecified toe(s): Secondary | ICD-10-CM

## 2020-10-28 DIAGNOSIS — D689 Coagulation defect, unspecified: Secondary | ICD-10-CM

## 2020-10-28 NOTE — Progress Notes (Signed)
Patient ID: [name], male   DOB: 20-Mar-1941, 80 y.o.   MRN: [id number] Complaint:  Visit Type: Patient returns to my office for continued preventative foot care services. Complaint: Patient states" my nails have grown long and thick and become painful to walk and wear shoes" Patient has been diagnosed with DM with no foot complications. The patient presents for preventative foot care services. No changes to ROS.  Patient is taking eliquiss.  He presents to the office with his wife.    Podiatric Exam: Vascular: dorsalis pedis  pulses and posterior pulses are weakly palpable  Bilateral.. Capillary return is immediate. Cold feet. Skin turgor WNL  Absent digital hair  B/L  Mild swelling noted feet  B/L. Sensorium: Normal Semmes Weinstein monofilament test. Normal tactile sensation bilaterally. Nail Exam: Pt has thick disfigured discolored nails with subungual debris noted bilateral entire nail hallux through fifth toenails Ulcer Exam: There is no evidence of ulcer or pre-ulcerative changes or infection. Orthopedic Exam: Muscle tone and strength are WNL. No limitations in general ROM. No crepitus or effusions noted. Foot type and digits show no abnormalities. Bony prominences are unremarkable.Hammer toe second right. Skin: No Porokeratosis. No infection or ulcers  Diagnosis:  Onychomycosis, , Pain in right toe, pain in left toes  Treatment & Plan Procedures and Treatment: Consent by patient was obtained for treatment procedures. The patient understood the discussion of treatment and procedures well. All questions were answered thoroughly reviewed. Debridement of mycotic and hypertrophic toenails, 1 through 5 bilateral and clearing of subungual debris. No ulceration, no infection noted.   Return Visit-Office Procedure: Patient instructed to return to the office for a follow up visit 3 months for continued evaluation and treatment.  Gardiner Barefoot DPM

## 2021-02-01 ENCOUNTER — Other Ambulatory Visit: Payer: Self-pay

## 2021-02-01 ENCOUNTER — Encounter: Payer: Self-pay | Admitting: Podiatry

## 2021-02-01 ENCOUNTER — Ambulatory Visit: Payer: Medicare Other | Admitting: Podiatry

## 2021-02-01 DIAGNOSIS — M79676 Pain in unspecified toe(s): Secondary | ICD-10-CM | POA: Diagnosis not present

## 2021-02-01 DIAGNOSIS — D689 Coagulation defect, unspecified: Secondary | ICD-10-CM

## 2021-02-01 DIAGNOSIS — B351 Tinea unguium: Secondary | ICD-10-CM

## 2021-02-01 DIAGNOSIS — E119 Type 2 diabetes mellitus without complications: Secondary | ICD-10-CM

## 2021-02-01 NOTE — Progress Notes (Signed)
Patient ID: [name], male   DOB: 20-Mar-1941, 80 y.o.   MRN: [id number] Complaint:  Visit Type: Patient returns to my office for continued preventative foot care services. Complaint: Patient states" my nails have grown long and thick and become painful to walk and wear shoes" Patient has been diagnosed with DM with no foot complications. The patient presents for preventative foot care services. No changes to ROS.  Patient is taking eliquiss.  He presents to the office with his wife.    Podiatric Exam: Vascular: dorsalis pedis  pulses and posterior pulses are weakly palpable  Bilateral.. Capillary return is immediate. Cold feet. Skin turgor WNL  Absent digital hair  B/L  Mild swelling noted feet  B/L. Sensorium: Normal Semmes Weinstein monofilament test. Normal tactile sensation bilaterally. Nail Exam: Pt has thick disfigured discolored nails with subungual debris noted bilateral entire nail hallux through fifth toenails Ulcer Exam: There is no evidence of ulcer or pre-ulcerative changes or infection. Orthopedic Exam: Muscle tone and strength are WNL. No limitations in general ROM. No crepitus or effusions noted. Foot type and digits show no abnormalities. Bony prominences are unremarkable.Hammer toe second right. Skin: No Porokeratosis. No infection or ulcers  Diagnosis:  Onychomycosis, , Pain in right toe, pain in left toes  Treatment & Plan Procedures and Treatment: Consent by patient was obtained for treatment procedures. The patient understood the discussion of treatment and procedures well. All questions were answered thoroughly reviewed. Debridement of mycotic and hypertrophic toenails, 1 through 5 bilateral and clearing of subungual debris. No ulceration, no infection noted.   Return Visit-Office Procedure: Patient instructed to return to the office for a follow up visit 3 months for continued evaluation and treatment.  Gardiner Barefoot DPM

## 2021-05-09 ENCOUNTER — Ambulatory Visit: Payer: Medicare Other | Admitting: Podiatry

## 2022-05-10 NOTE — Progress Notes (Signed)
[facility] Note  05/16/2022     CHIEF COMPLAINT Patient presents for Retina Evaluation   HISTORY OF PRESENT ILLNESS: [name] is a 82 y.o. male who presents to the clinic today for:   HPI     Retina Evaluation   In both eyes.  Associated Symptoms Distortion.  Negative for Flashes and Floaters.  I, the attending physician,  performed the HPI with the patient and updated documentation appropriately.        Comments   Patient is here today based on a referral for blood in the eyes. He is not using any eye drops at this time. His blood sugar was 134 and he is unsure of his A1C.       Last edited by Bernarda Caffey, MD on 05/16/2022  1:05 PM.    Pt is here on the referral of [name] in [place], pt states he has been seeing Dr. Jorja Loa for over 20 years, but this visit was a virtual visit, pt states he went in bc he was having blurry vision, pt has not gotten new glasses yet, pts last A1c is 6.3 on 07.25.23  Referring physician: [name], DO [address] [place],  [place] [id number]  HISTORICAL INFORMATION:   Selected notes from the MEDICAL RECORD NUMBER Referred by Dr. Williemae Natter via [facility] for diabetic retinopathy LEE:  Ocular Hx- PMH-    CURRENT MEDICATIONS: Current Outpatient Medications (Ophthalmic Drugs)  Medication Sig   Polyethyl Glycol-Propyl Glycol (SYSTANE) 0.4-0.3 % SOLN Place 1-2 drops into both eyes 3 (three) times daily as needed (dry/irritated eyes.).   No current facility-administered medications for this visit. (Ophthalmic Drugs)   Current Outpatient Medications (Other)  Medication Sig   apixaban (ELIQUIS) 5 MG TABS tablet Take 1 tablet (5 mg total) by mouth 2 (two) times daily.   carvedilol (COREG) 25 MG tablet Take 25 mg by mouth 2 (two) times daily with a meal.   Choline Fenofibrate (FENOFIBRIC ACID) 135 MG CPDR Take 135 mg by mouth daily.    escitalopram (LEXAPRO) 10 MG tablet Take 10 mg by  mouth daily.   insulin lispro (HUMALOG) 100 UNIT/ML KwikPen SMARTSIG:12 Unit(s) SUB-Q 3 Times Daily   LANTUS SOLOSTAR 100 UNIT/ML Solostar Pen SMARTSIG:30 Unit(s) SUB-Q Daily   losartan-hydrochlorothiazide (HYZAAR) 100-25 MG tablet Take 1 tablet by mouth daily.   mirabegron ER (MYRBETRIQ) 50 MG TB24 tablet Take 50 mg by mouth daily.   ONETOUCH VERIO test strip SMARTSIG:1 Strip(s) Via Meter 5 Times Daily   simvastatin (ZOCOR) 80 MG tablet Take 80 mg by mouth daily.   tizanidine (ZANAFLEX) 2 MG capsule Take 4 mg by mouth every 8 (eight) hours as needed.   tiZANidine (ZANAFLEX) 2 MG tablet Take 2 mg by mouth every 6 (six) hours as needed for muscle spasms.   No current facility-administered medications for this visit. (Other)   REVIEW OF SYSTEMS: ROS   Positive for: Endocrine, Eyes Last edited by Annie Paras, COT on 05/16/2022  9:08 AM.     ALLERGIES No Known Allergies  PAST MEDICAL HISTORY Past Medical History:  Diagnosis Date   Atrial fibrillation ([place])    Cholelithiasis 10/2011   Per CT. No cholecystitis radiographically.   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   Shortness of breath    exertion   Sleep apnea    cpap   Stroke [facility]) 2010/2013   Right Brain stroke   Past Surgical History:  Procedure Laterality Date   CARDIOVERSION  07/12/2011   Procedure: CARDIOVERSION;  Surgeon: Pixie Casino, MD;  Location: [facility] ENDOSCOPY;  Service: Cardiovascular;  Laterality: N/A;   CAROTID DOPPLER STUDY  01/31/2012   no significant extracranial carotid artery stenosis   CATARACT EXTRACTION W/PHACO Left 01/30/2019   Procedure: CATARACT EXTRACTION PHACO AND INTRAOCULAR LENS PLACEMENT (IOC) (CDE: 4.76);  Surgeon: [name], MD;   Location: [facility] ORS;  Service: Ophthalmology;  Laterality: Left;   COLONOSCOPY     DOPPLER ECHOCARDIOGRAPHY  01/31/2012   EF 55-60%, moderately calcified annulus of the mitral valve, left atrium demonstrated mild-moderately dilated   KNEE SURGERY     TEE WITHOUT CARDIOVERSION  07/12/2011   Procedure: TRANSESOPHAGEAL ECHOCARDIOGRAM (TEE);  Surgeon: Pixie Casino, MD;  Location: [facility] ENDOSCOPY;  Service: Cardiovascular;  Laterality: N/A;  to be done at [place] Family History  Problem Relation Age of Onset   Diabetes Mother    Diabetes Son    Pancreatic cancer Other        GRANDFATHER   Colon cancer Other        INTESTINAL, GRANDMOTHER   SOCIAL HISTORY Social History   Tobacco Use   Smoking status: Never   Smokeless tobacco: Never  Substance Use Topics   Alcohol use: Yes    Alcohol/week: 2.0 standard drinks of alcohol    Types: 2 Glasses of wine per week    Comment: occasionally    Drug use: No       OPHTHALMIC EXAM:  Base Eye Exam     Visual Acuity (Snellen - Linear)       Right Left   Dist cc 20/30 +2 20/150 +2   Dist ph cc NI 20/70    Correction: Glasses         Tonometry (Tonopen, 9:15 AM)       Right Left   Pressure 21 20         Pupils       Dark Light Shape React APD   Right 4 3 Round Slow None   Left 4 3 Round Slow None         Visual Fields       Left Right    Full Full         Extraocular Movement       Right Left    Full, Ortho Full, Ortho         Neuro/Psych     Oriented x3: Yes         Dilation     Both eyes: 1.0% Mydriacyl, 2.5% Phenylephrine @ 9:08 AM           Slit Lamp and Fundus Exam     Slit Lamp Exam       Right Left   Lids/Lashes Dermatochalasis - upper lid, mild MGD Dermatochalasis - upper lid, mild MGD   Conjunctiva/Sclera White and quiet White and quiet   Cornea tear film debris 1+ Punctate epithelial erosions, tear film debris, well healed cataract wound    Anterior Chamber deep and clear deep and clear   Iris Round and dilated Round and dilated   Lens 2+ Nuclear sclerosis,  2+ Cortical cataract PC IOL in good position, mild pigment on optic inferiorly   Anterior Vitreous mild syneresis Posterior vitreous detachment         Fundus Exam       Right Left   Disc mild Pallor, Sharp rim, temporal PPA 2+Pallor, Sharp rim, temporal PPA   C/D Ratio 0.3 0.2   Macula Flat, Blunted foveal reflex, RPE mottling, No heme or edema, rare drusen Flat, Blunted foveal reflex, RPE mottling, No heme or edema, rare drusen   Vessels attenuated, mild tortuosity, AV crossing changes attenuated, mild tortuosity, AV crossing changes   Periphery Attached Attached, cluster of MA superior midzone           Refraction     Wearing Rx       Sphere Cylinder Axis   Right -1.50 +1.50 177   Left -2.75 +2.00 004  Have to pick up new glasses          IMAGING AND PROCEDURES  Imaging and Procedures for 05/16/2022  OCT, Retina - OU - Both Eyes       Right Eye Quality was good. Central Foveal Thickness: 296. Progression has no prior data. Findings include normal foveal contour, no IRF, no SRF (Rare drusen).   Left Eye Central Foveal Thickness: 311. Progression has no prior data. Findings include normal foveal contour, no IRF, no SRF (Trace vitreous opacities).   Notes *Images captured and stored on drive  Diagnosis / Impression:  NFP; no IRF/SRF OU; No DME OU OD: Rare drusen OS: Trace vitreous opacities  Clinical management:  See below  Abbreviations: NFP - Normal foveal profile. CME - cystoid macular edema. PED - pigment epithelial detachment. IRF - intraretinal fluid. SRF - subretinal fluid. EZ - ellipsoid zone. ERM - epiretinal membrane. ORA - outer retinal atrophy. ORT - outer retinal tubulation. SRHM - subretinal hyper-reflective material. IRHM - intraretinal hyper-reflective material            ASSESSMENT/PLAN:    ICD-10-CM   1. Mild  nonproliferative diabetic retinopathy of both eyes without macular edema associated with type 2 diabetes mellitus (HCC)  [id number] OCT, Retina - OU - Both Eyes    2. Essential hypertension  I10     3. Hypertensive retinopathy of both eyes  H35.033     4. Combined forms of age-related cataract of right eye  H25.811     5. Pseudophakia  Z96.1      Mild nonproliferative diabetic retinopathy w/o DME, both eyes  - last A1c 6.3 (07.25.23 via [facility]) - The incidence, risk factors for progression, natural history and treatment options for diabetic retinopathy were discussed with patient.   - The need for close monitoring of blood glucose, blood pressure, and serum lipids, avoiding cigarette or any type of tobacco, and the need for long term follow up was also discussed with patient. - exam shows cluster of MA superior midzone OS; OD w/ minimal retinopathy - OCT without diabetic macular edema, both eyes  - f/u in 1 yr, sooner prn -- DFE/OCT  2,3. Hypertensive retinopathy OU - discussed importance of tight BP control - monitor  4. Mixed Cataract OD - The symptoms of cataract, surgical options, and treatments and risks were discussed with patient. - discussed diagnosis and progression - approaching visual significance - may benefit from cataract eval  5. Pseudophakia OS  - s/p CE/IOL OS ([name], 10.30.20)  - IOL in good position, doing well  - monitor  Ophthalmic Meds  Ordered this visit:  No orders of the defined types were placed in this encounter.    Return in about 1 year (around 05/17/2023) for f/u NPDR OU, DFE, OCT.  There are no Patient Instructions on file for this visit.   Explained the diagnoses, plan, and follow up with the patient and they expressed understanding.  Patient expressed understanding of the importance of proper follow up care.   This document serves as a record of services personally performed by Gardiner Sleeper, MD, PhD.  It was created on their behalf by Orvan Falconer, an ophthalmic technician. The creation of this record is the provider's dictation and/or activities during the visit.    Electronically signed by: Orvan Falconer, OA, 05/16/22  1:07 PM  This document serves as a record of services personally performed by Gardiner Sleeper, MD, PhD. It was created on their behalf by San Jetty. Owens Shark, OA an ophthalmic technician. The creation of this record is the provider's dictation and/or activities during the visit.    Electronically signed by: San Jetty. Owens Shark, [place] 02.14.2024 1:07 PM   Gardiner Sleeper, M.D., Ph.D. Diseases & Surgery of the Retina and Vitreous [facility]  I have reviewed the above documentation for accuracy and completeness, and I agree with the above. Gardiner Sleeper, M.D., Ph.D. 05/16/22 1:12 PM   Abbreviations: M myopia (nearsighted); A astigmatism; H hyperopia (farsighted); P presbyopia; Mrx spectacle prescription;  CTL contact lenses; OD right eye; OS left eye; OU both eyes  XT exotropia; ET esotropia; PEK punctate epithelial keratitis; PEE punctate epithelial erosions; DES dry eye syndrome; MGD meibomian gland dysfunction; ATs artificial tears; PFAT's preservative free artificial tears; [place] nuclear sclerotic cataract; PSC posterior subcapsular cataract; ERM epi-retinal membrane; PVD posterior vitreous detachment; RD retinal detachment; DM diabetes mellitus; DR diabetic retinopathy; NPDR non-proliferative diabetic retinopathy; PDR proliferative diabetic retinopathy; CSME clinically significant macular edema; DME diabetic macular edema; dbh dot blot hemorrhages; CWS cotton wool spot; POAG primary open angle glaucoma; C/D cup-to-disc ratio; HVF humphrey visual field; GVF goldmann visual field; OCT optical coherence tomography; IOP intraocular pressure; BRVO Branch retinal vein occlusion; CRVO central retinal vein occlusion; CRAO central retinal artery occlusion; BRAO  branch retinal artery occlusion; RT retinal tear; SB scleral buckle; PPV pars plana vitrectomy; VH Vitreous hemorrhage; PRP panretinal laser photocoagulation; IVK intravitreal kenalog; VMT vitreomacular traction; MH Macular hole;  NVD neovascularization of the disc; NVE neovascularization elsewhere; AREDS age related eye disease study; ARMD age related macular degeneration; POAG primary open angle glaucoma; EBMD epithelial/anterior basement membrane dystrophy; ACIOL anterior chamber intraocular lens; IOL intraocular lens; PCIOL posterior chamber intraocular lens; Phaco/IOL phacoemulsification with intraocular lens placement; [place] photorefractive keratectomy; LASIK laser assisted in situ keratomileusis; HTN hypertension; DM diabetes mellitus; COPD chronic obstructive pulmonary disease

## 2022-05-16 ENCOUNTER — Ambulatory Visit (INDEPENDENT_AMBULATORY_CARE_PROVIDER_SITE_OTHER): Payer: Medicare Other | Admitting: Ophthalmology

## 2022-05-16 ENCOUNTER — Encounter (INDEPENDENT_AMBULATORY_CARE_PROVIDER_SITE_OTHER): Payer: Self-pay | Admitting: Ophthalmology

## 2022-05-16 DIAGNOSIS — H25811 Combined forms of age-related cataract, right eye: Secondary | ICD-10-CM

## 2022-05-16 DIAGNOSIS — Z961 Presence of intraocular lens: Secondary | ICD-10-CM

## 2022-05-16 DIAGNOSIS — H3581 Retinal edema: Secondary | ICD-10-CM

## 2022-05-16 DIAGNOSIS — H35033 Hypertensive retinopathy, bilateral: Secondary | ICD-10-CM | POA: Diagnosis not present

## 2022-05-16 DIAGNOSIS — E113293 Type 2 diabetes mellitus with mild nonproliferative diabetic retinopathy without macular edema, bilateral: Secondary | ICD-10-CM

## 2022-05-16 DIAGNOSIS — I10 Essential (primary) hypertension: Secondary | ICD-10-CM

## 2022-06-14 ENCOUNTER — Emergency Department (HOSPITAL_COMMUNITY): Payer: Medicare Other

## 2022-06-14 ENCOUNTER — Encounter (HOSPITAL_COMMUNITY): Payer: Self-pay

## 2022-06-14 ENCOUNTER — Emergency Department (HOSPITAL_COMMUNITY)
Admission: EM | Admit: 2022-06-14 | Discharge: 2022-06-14 | Disposition: A | Discharge status: Home / Self Care | Payer: Medicare Other | Attending: Emergency Medicine | Admitting: Emergency Medicine

## 2022-06-14 ENCOUNTER — Other Ambulatory Visit: Payer: Self-pay

## 2022-06-14 DIAGNOSIS — R479 Unspecified speech disturbances: Secondary | ICD-10-CM | POA: Diagnosis not present

## 2022-06-14 DIAGNOSIS — I10 Essential (primary) hypertension: Secondary | ICD-10-CM | POA: Insufficient documentation

## 2022-06-14 DIAGNOSIS — Z8673 Personal history of transient ischemic attack (TIA), and cerebral infarction without residual deficits: Secondary | ICD-10-CM | POA: Diagnosis not present

## 2022-06-14 DIAGNOSIS — Z7901 Long term (current) use of anticoagulants: Secondary | ICD-10-CM | POA: Insufficient documentation

## 2022-06-14 DIAGNOSIS — R4701 Aphasia: Secondary | ICD-10-CM | POA: Diagnosis present

## 2022-06-14 DIAGNOSIS — Z794 Long term (current) use of insulin: Secondary | ICD-10-CM | POA: Insufficient documentation

## 2022-06-14 DIAGNOSIS — E119 Type 2 diabetes mellitus without complications: Secondary | ICD-10-CM | POA: Insufficient documentation

## 2022-06-14 DIAGNOSIS — Z79899 Other long term (current) drug therapy: Secondary | ICD-10-CM | POA: Diagnosis not present

## 2022-06-14 LAB — DIFFERENTIAL
Abs Immature Granulocytes: 0.03 10*3/uL (ref 0.00–0.07)
Basophils Absolute: 0.1 10*3/uL (ref 0.0–0.1)
Basophils Relative: 1 %
Eosinophils Absolute: 0.1 10*3/uL (ref 0.0–0.5)
Eosinophils Relative: 1 %
Immature Granulocytes: 0 %
Lymphocytes Relative: 14 %
Lymphs Abs: 1.4 10*3/uL (ref 0.7–4.0)
Monocytes Absolute: 0.8 10*3/uL (ref 0.1–1.0)
Monocytes Relative: 8 %
Neutro Abs: 7.4 10*3/uL (ref 1.7–7.7)
Neutrophils Relative %: 76 %

## 2022-06-14 LAB — ETHANOL: Alcohol, Ethyl (B): <10 mg/dL (ref <10)

## 2022-06-14 LAB — COMPREHENSIVE METABOLIC PANEL
ALT: 18 U/L (ref 0–44)
AST: 30 U/L (ref 15–41)
Albumin: 4.3 g/dL (ref 3.5–5.0)
Alkaline Phosphatase: 48 U/L (ref 38–126)
Anion gap: 14 (ref 5–15)
BUN: 23 mg/dL (ref 8–23)
CO2: 22 mmol/L (ref 22–32)
Calcium: 9.6 mg/dL (ref 8.9–10.3)
Chloride: 101 mmol/L (ref 98–111)
Creatinine, Ser: 1.1 mg/dL (ref 0.61–1.24)
GFR, Estimated: >60 mL/min (ref >60)
Glucose, Bld: 80 mg/dL (ref 70–99)
Potassium: 3.7 mmol/L (ref 3.5–5.1)
Sodium: 137 mmol/L (ref 135–145)
Total Bilirubin: 1.4 mg/dL — ABNORMAL HIGH (ref 0.3–1.2)
Total Protein: 7.4 g/dL (ref 6.5–8.1)

## 2022-06-14 LAB — CBC
HCT: 48.5 % (ref 39.0–52.0)
Hemoglobin: 15.7 g/dL (ref 13.0–17.0)
MCH: 31.2 pg (ref 26.0–34.0)
MCHC: 32.4 g/dL (ref 30.0–36.0)
MCV: 96.4 fL (ref 80.0–100.0)
Platelets: 168 10*3/uL (ref 150–400)
RBC: 5.03 MIL/uL (ref 4.22–5.81)
RDW: 12.7 % (ref 11.5–15.5)
WBC: 9.8 10*3/uL (ref 4.0–10.5)
nRBC: 0 % (ref 0.0–0.2)

## 2022-06-14 LAB — APTT: aPTT: 32 seconds (ref 24–36)

## 2022-06-14 LAB — PROTIME-INR
INR: 1.2 (ref 0.8–1.2)
Prothrombin Time: 15.2 seconds (ref 11.4–15.2)

## 2022-06-14 LAB — CBG MONITORING, ED: Glucose-Capillary: 70 mg/dL (ref 70–99)

## 2022-06-14 MED ORDER — SODIUM CHLORIDE 0.9% FLUSH: 3.0000 mL | Freq: Once | INTRAVENOUS | Status: DC

## 2022-06-14 NOTE — ED Triage Notes (Signed)
Pt had slurred speech about an hour before they came to the hospital, pt is now able to answer questions and wife said that he sounds normal now.

## 2022-06-14 NOTE — ED Provider Notes (Signed)
[facility] Provider Note   CSN: [id number] Arrival date & time: 06/14/22  1651     History  Chief Complaint  Patient presents with   Aphasia    [name] is a 82 y.o. male.  HPI Patient with multiple medical problem presents with his wife who assists with the history.  Patient has history most notable for stroke, A-fib, diabetes, he is on Eliquis.  He presents today after an episode of less than 30 minutes of word finding difficulty with deliberate speech.  No true slurring of speech, no loss of speech, no focal weakness.  Symptoms occurred after patient was active for the day, resolved without any intervention prior to ED arrival.    Home Medications Prior to Admission medications   Medication Sig Start Date End Date Taking? Authorizing Provider  apixaban (ELIQUIS) 5 MG TABS tablet Take 1 tablet (5 mg total) by mouth 2 (two) times daily. 10/30/13   Hilty, Nadean Corwin, MD  carvedilol (COREG) 25 MG tablet Take 25 mg by mouth 2 (two) times daily with a meal.    [provider]  Choline Fenofibrate (FENOFIBRIC ACID) 135 MG CPDR Take 135 mg by mouth daily.     [provider]  escitalopram (LEXAPRO) 10 MG tablet Take 10 mg by mouth daily.    [provider]  insulin lispro (HUMALOG) 100 UNIT/ML KwikPen SMARTSIG:12 Unit(s) SUB-Q 3 Times Daily 10/28/19   [provider]  LANTUS SOLOSTAR 100 UNIT/ML Solostar Pen SMARTSIG:30 Unit(s) SUB-Q Daily 10/19/19   [provider]  losartan-hydrochlorothiazide (HYZAAR) 100-25 MG tablet Take 1 tablet by mouth daily. 07/24/17   [provider]  mirabegron ER (MYRBETRIQ) 50 MG TB24 tablet Take 50 mg by mouth daily.    [provider]  Roma Schanz test strip SMARTSIG:1 Strip(s) Via Meter 5 Times Daily 01/15/19   [provider]  Polyethyl Glycol-Propyl Glycol (SYSTANE) 0.4-0.3 % SOLN Place 1-2 drops into both eyes 3 (three) times daily as  needed (dry/irritated eyes.).    [provider]  simvastatin (ZOCOR) 80 MG tablet Take 80 mg by mouth daily.    [provider]  tizanidine (ZANAFLEX) 2 MG capsule Take 4 mg by mouth every 8 (eight) hours as needed. 07/04/19   [provider]  tiZANidine (ZANAFLEX) 2 MG tablet Take 2 mg by mouth every 6 (six) hours as needed for muscle spasms.    [provider]      Allergies    Patient has no known allergies.    Review of Systems   Review of Systems  All other systems reviewed and are negative.   Physical Exam Updated Vital Signs BP 138/80   Pulse (!) 101   Temp 97.6 F (36.4 C) (Oral)   Resp 16   Ht '5\' 8"'$  (1.727 m)   Wt 99.8 kg   SpO2 97%   BMI 33.45 kg/m  Physical Exam Vitals and nursing note reviewed.  Constitutional:      General: He is not in acute distress.    Appearance: He is well-developed.  HENT:     Head: Normocephalic and atraumatic.  Eyes:     Conjunctiva/sclera: Conjunctivae normal.  Cardiovascular:     Rate and Rhythm: Normal rate and regular rhythm.  Pulmonary:     Effort: Pulmonary effort is normal. No respiratory distress.     Breath sounds: No stridor.  Abdominal:     General: There is no distension.  Skin:    General: Skin is warm and dry.  Neurological:     Mental Status: He is alert and oriented to person, place, and time.     Motor: Atrophy present.     Coordination: Coordination normal.     Comments: Age-appropriate atrophy.  Face is symmetric speech is clear, brief, appropriate.     ED Results / Procedures / Treatments   Labs (all labs ordered are listed, but only abnormal results are displayed) Labs Reviewed  COMPREHENSIVE METABOLIC PANEL - Abnormal; Notable for the following components:      Result Value   Total Bilirubin 1.4 (*)    All other components within normal limits  PROTIME-INR  APTT  CBC  DIFFERENTIAL  ETHANOL  CBG MONITORING, ED    EKG EKG  Interpretation  Date/Time:  Thursday June 14 2022 21:29:59 EDT Ventricular Rate:  119 PR Interval:    QRS Duration: 104 QT Interval:  369 QTC Calculation: 469 R Axis:   -8 Text Interpretation: Atrial fibrillation Ventricular premature complex Abnormal ECG Confirmed by Carmin Muskrat [id number]) on 06/14/2022 9:49:53 PM  Radiology CT HEAD WO CONTRAST  Result Date: 06/14/2022 CLINICAL DATA:  Neuro deficit, acute, stroke suspected. Slurred speech about an hour before they came to the hospital, pt is now able to answer questions and wife said that he sounds normal now. EXAM: CT HEAD WITHOUT CONTRAST TECHNIQUE: Contiguous axial images were obtained from the base of the skull through the vertex without intravenous contrast. RADIATION DOSE REDUCTION: This exam was performed according to the departmental dose-optimization program which includes automated exposure control, adjustment of the mA and/or kV according to patient size and/or use of iterative reconstruction technique. COMPARISON:  CT head 03/14/2014, MRI head 06/14/2022, CT head 08/08/2016 FINDINGS: Brain: Patchy and confluent areas of decreased attenuation are noted throughout the deep and periventricular white matter of the cerebral hemispheres bilaterally, compatible with chronic microvascular ischemic disease. Similar-appearing encephalomalacia along the left frontal lobe and left occipital lobe. Chronic right basal ganglia lacunar infarction. No evidence of large-territorial acute infarction. No parenchymal hemorrhage. No mass lesion. No extra-axial collection. No mass effect or midline shift. No hydrocephalus. Basilar cisterns are patent. Vascular: No hyperdense vessel. Atherosclerotic calcifications are present within the cavernous internal carotid and vertebral arteries. Skull: No acute fracture or focal lesion. Sinuses/Orbits: Right maxillary sinus mucosal thickening. Paranasal sinuses and mastoid air cells are clear. The orbits are  unremarkable. Other: None. IMPRESSION: No acute intracranial abnormality in a patient with chronic infarction and chronic microvascular ischemic changes. Please see separately dictated MRI head 06/14/2022 for further details. Electronically Signed   By: Iven Finn M.D.   On: 06/14/2022 19:03   MR BRAIN WO CONTRAST  Result Date: 06/14/2022 CLINICAL DATA:  Neuro deficit, acute, stroke suspected. Slurred speech. EXAM: MRI HEAD WITHOUT CONTRAST TECHNIQUE: Multiplanar, multiecho pulse sequences of the brain and surrounding structures were obtained without intravenous contrast. COMPARISON:  Head CT 03/14/2014 and MRI 09/03/2013 FINDINGS: Brain: There is no evidence of an acute infarct, mass, midline shift, or extra-axial fluid collection. Confluent T2 hyperintensities in the cerebral white matter bilaterally have progressed from the prior MRI and are nonspecific but compatible with severe chronic small vessel ischemic disease. A moderately large chronic left PCA infarct is noted. There are smaller chronic infarcts involving the left frontal lobe, and there is also an unchanged chronic infarct involving the right corona radiata, thalamus, and basal ganglia with associated chronic blood products. There are tiny chronic cerebellar infarcts. There is  moderate cerebral atrophy. Vascular: Major intracranial arterial flow voids are grossly preserved. Absent flow void in the left transverse and left sigmoid sinuses, favor slow flow over occlusion. Skull and upper cervical spine: No suspicious marrow lesion. C1-2 arthropathy with ligamentous thickening posterior to the dens resulting in mild spinal stenosis without cord compression. Sinuses/Orbits: Left cataract extraction. Mild mucosal thickening in the right maxillary sinus. Clear mastoid air cells. Other: None. IMPRESSION: 1. No acute intracranial abnormality. 2. 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 Electronically Signed    By: Logan Bores M.D.   On: 06/14/2022 19:01    Procedures Procedures    Medications Ordered in ED Medications  sodium chloride flush (NS) 0.9 % injection 3 mL (has no administration in time range)    ED Course/ Medical Decision Making/ A&P                             Medical Decision Making Adult male with history of stroke, A-fib, hypertension, diabetes presents with an episode of speech difficulty that resolved prior to ED arrival.  Concern for TIA versus other intracranial phenomena versus Electra abnormalities, mass.  CT, MR, labs ordered from triage. Cardiac 85 sinus normal Pulse ox 100% room air normal   Amount and/or Complexity of Data Reviewed Independent Historian: spouse External Data Reviewed: notes.    Details: He notes for ongoing evaluation of A-fib, anticoagulation, notation of sleep apnea reviewed Labs: ordered. Decision-making details documented in ED Course. Radiology: ordered and independent interpretation performed. Decision-making details documented in ED Course. ECG/medicine tests: ordered and independent interpretation performed. Decision-making details documented in ED Course.   11:10 PM Patient in no distress, awake, alert.  No recurrence of speech difficulty. This adult male presents after episode of possible aphasia, though it sounds like more deliberate speech after a period of exertion that occurred earlier in the day.  Patient had no other neurochanges at that time has had no neurodeficits throughout hours of monitoring in the emergency department, has labs, ECG, x-ray, CT that suggest history of prior strokes, otherwise generally reassuring.  Patient, his wife and I discussed his history of stroke, his current medication regimen, consideration of changes to minimize stroke risk, utility of outpatient follow-up, and they are both comfortable with close outpatient follow-up, noting that they will call her physician tomorrow.  Absent alarming findings,  as above, patient discharged.        Final Clinical Impression(s) / ED Diagnoses Final diagnoses:  Difficulty with speech    Rx / DC Orders ED Discharge Orders     None         Carmin Muskrat, MD 06/14/22 2310

## 2022-06-14 NOTE — Discharge Instructions (Signed)
As discussed, today's evaluation has been generally reassuring.  The worst case possibility for your brief speech disturbance was a transient ischemic attack or TIA.  Please call your physician tomorrow, discuss today's event, today's evaluation in the emergency department and specifically request a conversation to discuss your optimal medication regimen for stroke risk reduction.  Return here for concerning changes in your condition.

## 2022-06-14 NOTE — ED Provider Triage Note (Signed)
Emergency Medicine Provider Triage Evaluation Note  [name] , a 82 y.o. male  was evaluated in triage.  Pt complains of trouble speaking which started around 4 PM.  Wife is at bedside provides most of the history.  She states that he had no other neurological deficits but does have a history of stroke.  He does take Eliquis and has not missed any doses.  They started doing exercise classes at the gym.  She also states that he did not have much to eat for lunch.  Review of Systems  Positive:  Negative: See above   Physical Exam  BP (!) 161/96 (BP Location: Right Arm)   Pulse 98   Temp 97.6 F (36.4 C) (Oral)   Resp 18   Ht '5\' 8"'$  (1.727 m)   Wt 99.8 kg   SpO2 100%   BMI 33.45 kg/m  Gen:   Awake, no distress   Resp:  Normal effort  MSK:   Moves extremities without difficulty  Other:  Cranial nerves II to XII are intact.  5/5 strength to the upper and lower extremity.  Normal sensation to the upper and lower extremities.  No dysmetria with finger-to-nose.  Medical Decision Making  Medically screening exam initiated at 5:26 PM.  Appropriate orders placed.  Dilyn A Legacy was informed that the remainder of the evaluation will be completed by another provider, this initial triage assessment does not replace that evaluation, and the importance of remaining in the ED until their evaluation is complete.     Myna Bright Crouch, [place] 06/14/22 1900

## 2022-06-14 NOTE — ED Notes (Signed)
Per pt's spouse, pt presents at baseline. Speech at baseline

## 2022-10-24 DIAGNOSIS — N1831 Chronic kidney disease, stage 3a: Secondary | ICD-10-CM | POA: Diagnosis not present

## 2022-10-24 DIAGNOSIS — E1140 Type 2 diabetes mellitus with diabetic neuropathy, unspecified: Secondary | ICD-10-CM | POA: Diagnosis not present

## 2022-10-24 DIAGNOSIS — Z794 Long term (current) use of insulin: Secondary | ICD-10-CM | POA: Diagnosis not present

## 2022-10-24 DIAGNOSIS — I5032 Chronic diastolic (congestive) heart failure: Secondary | ICD-10-CM | POA: Diagnosis not present

## 2022-10-24 DIAGNOSIS — G319 Degenerative disease of nervous system, unspecified: Secondary | ICD-10-CM | POA: Diagnosis not present

## 2022-10-24 DIAGNOSIS — I130 Hypertensive heart and chronic kidney disease with heart failure and stage 1 through stage 4 chronic kidney disease, or unspecified chronic kidney disease: Secondary | ICD-10-CM | POA: Diagnosis not present

## 2022-10-24 DIAGNOSIS — I480 Paroxysmal atrial fibrillation: Secondary | ICD-10-CM | POA: Diagnosis not present

## 2022-10-24 DIAGNOSIS — E785 Hyperlipidemia, unspecified: Secondary | ICD-10-CM | POA: Diagnosis not present

## 2022-12-06 ENCOUNTER — Ambulatory Visit: Payer: Self-pay | Admitting: Cardiology

## 2022-12-26 ENCOUNTER — Ambulatory Visit: Payer: Medicare Other | Admitting: Internal Medicine

## 2022-12-27 ENCOUNTER — Ambulatory Visit: Payer: Medicare HMO | Attending: Internal Medicine | Admitting: Internal Medicine

## 2022-12-27 ENCOUNTER — Encounter: Payer: Self-pay | Admitting: Internal Medicine

## 2022-12-27 VITALS — BP 122/68 | HR 104 | Ht 68.0 in | Wt 193.2 lb

## 2022-12-27 DIAGNOSIS — I480 Paroxysmal atrial fibrillation: Secondary | ICD-10-CM

## 2022-12-27 DIAGNOSIS — I77810 Thoracic aortic ectasia: Secondary | ICD-10-CM | POA: Insufficient documentation

## 2022-12-27 DIAGNOSIS — I351 Nonrheumatic aortic (valve) insufficiency: Secondary | ICD-10-CM | POA: Diagnosis not present

## 2022-12-27 MED ORDER — DILTIAZEM HCL ER COATED BEADS 120 MG PO CP24: 120.0000 mg | ORAL_CAPSULE | Freq: Every day | ORAL | 3 refills | Status: DC

## 2022-12-27 NOTE — Patient Instructions (Signed)
Medication Instructions:  Your physician has recommended you make the following change in your medication:   -Start Dilitazem 120 mg tablet by mouth once daily.   *If you need a refill on your cardiac medications before your next appointment, please call your pharmacy*   Lab Work: None If you have labs (blood work) drawn today and your tests are completely normal, you will receive your results only by: MyChart Message (if you have MyChart) OR A paper copy in the mail If you have any lab test that is abnormal or we need to change your treatment, we will call you to review the results.   Testing/Procedures: Your physician has requested that you have an echocardiogram. Echocardiography is a painless test that uses sound waves to create images of your heart. It provides your doctor with information about the size and shape of your heart and how well your heart's chambers and valves are working. This procedure takes approximately one hour. There are no restrictions for this procedure. Please do NOT wear cologne, perfume, aftershave, or lotions (deodorant is allowed). Please arrive 15 minutes prior to your appointment time.    Follow-Up: At [facility], you and your health needs are our priority.  As part of our continuing mission to provide you with exceptional heart care, we have created designated Provider Care Teams.  These Care Teams include your primary Cardiologist (physician) and Advanced Practice Providers (APPs -  Physician Assistants and Nurse Practitioners) who all work together to provide you with the care you need, when you need it.  We recommend signing up for the patient portal called "MyChart".  Sign up information is provided on this After Visit Summary.  MyChart is used to connect with patients for Virtual Visits (Telemedicine).  Patients are able to view lab/test results, encounter notes, upcoming appointments, etc.  Non-urgent messages can be sent to your provider  as well.   To learn more about what you can do with MyChart, go to [email or web address].    Your next appointment:   6 month(s)  Provider:   You may see Luane School, MD or one of the following Advanced Practice Providers on your designated Care Team:   Randall An, PA-C  Jacolyn Reedy, [place]     Other Instructions Nurse Visit for EKG in 1 week.

## 2022-12-27 NOTE — Progress Notes (Signed)
Cardiology Office Note  Date: 12/27/2022   ID: TRAVIN SUPPA, DOB [birth year], MRN [id number]  PCP:  [name], MD  Cardiologist:  Marjo Bicker, MD Electrophysiologist:  None   History of Present Illness: [name] is a 82 y.o. 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  Patient's son-in-law passed away with the sudden death at their porch a few weeks ago due to which patient's wife requested to be referred to cardiology.  Echocardiogram in 2021 showed normal LVEF, mild to moderate AR and mild AS.  Overall doing great, no symptoms of angina, DOE, orthopnea, PND, leg swelling, dizziness, presyncope and syncope.  Does not do much at home, walks when he wants to.  Past Medical History:  Diagnosis Date   Atrial fibrillation (HCC)    Cholelithiasis 10/2011   Per CT. No cholecystitis radiographically.   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   Shortness of breath    exertion   Sleep apnea    cpap   Stroke [facility]) 2010/2013   Right Brain stroke    Past Surgical History:  Procedure Laterality Date   CARDIOVERSION  07/12/2011   Procedure: CARDIOVERSION;  Surgeon: [name], MD;  Location: [facility] ENDOSCOPY;  Service: Cardiovascular;  Laterality: N/A;   CAROTID DOPPLER STUDY  01/31/2012   no significant extracranial carotid artery stenosis   CATARACT EXTRACTION W/PHACO Left 01/30/2019   Procedure: CATARACT EXTRACTION PHACO AND INTRAOCULAR LENS PLACEMENT (IOC) (CDE: 4.76);  Surgeon: [name], MD;  Location: [facility] ORS;  Service:  Ophthalmology;  Laterality: Left;   COLONOSCOPY     DOPPLER ECHOCARDIOGRAPHY  01/31/2012   EF 55-60%, moderately calcified annulus of the mitral valve, left atrium demonstrated mild-moderately dilated   KNEE SURGERY     TEE WITHOUT CARDIOVERSION  07/12/2011   Procedure: TRANSESOPHAGEAL ECHOCARDIOGRAM (TEE);  Surgeon: [name], MD;  Location: [facility] ENDOSCOPY;  Service: Cardiovascular;  Laterality: N/A;  to be done at 1330   TONSILLECTOMY      Current Outpatient Medications  Medication Sig Dispense Refill   apixaban (ELIQUIS) 5 MG TABS tablet Take 1 tablet (5 mg total) by mouth 2 (two) times daily. 180 tablet 3   carvedilol (COREG) 25 MG tablet Take 25 mg by mouth 2 (two) times daily with a meal.     Choline Fenofibrate (FENOFIBRIC ACID) 135 MG CPDR Take 135 mg by mouth daily.      diltiazem (CARDIZEM CD) 120 MG 24 hr capsule Take 1 capsule (120 mg total) by mouth daily. 90 capsule 3   insulin lispro (HUMALOG) 100 UNIT/ML KwikPen SMARTSIG:12 Unit(s) SUB-Q 3 Times Daily     LANTUS SOLOSTAR 100 UNIT/ML Solostar Pen SMARTSIG:30 Unit(s) SUB-Q Daily     losartan-hydrochlorothiazide (HYZAAR) 100-25 MG tablet Take 1 tablet by mouth daily.  6   mirabegron ER (MYRBETRIQ) 50 MG TB24 tablet Take 50 mg by mouth daily.     ONETOUCH VERIO test strip SMARTSIG:1 Strip(s) Via Meter 5 Times Daily     simvastatin (  ZOCOR) 80 MG tablet Take 80 mg by mouth daily.     escitalopram (LEXAPRO) 10 MG tablet Take 10 mg by mouth daily. (Patient not taking: Reported on 12/27/2022)     Polyethyl Glycol-Propyl Glycol (SYSTANE) 0.4-0.3 % SOLN Place 1-2 drops into both eyes 3 (three) times daily as needed (dry/irritated eyes.). (Patient not taking: Reported on 12/27/2022)     tizanidine (ZANAFLEX) 2 MG capsule Take 4 mg by mouth every 8 (eight) hours as needed. (Patient not taking: Reported on 12/27/2022)     tiZANidine (ZANAFLEX) 2 MG tablet Take 2 mg by mouth every 6 (six) hours as needed for muscle spasms. (Patient not  taking: Reported on 12/27/2022)     No current facility-administered medications for this visit.   Allergies:  Patient has no known allergies.   Social History: The patient  reports that he has never smoked. He has never used smokeless tobacco. He reports current alcohol use of about 2.0 standard drinks of alcohol per week. He reports that he does not use drugs.   Family History: The patient's family history includes Colon cancer in an other family member; Diabetes in his mother and son; Pancreatic cancer in an other family member.   ROS:  Please see the history of present illness. Otherwise, complete review of systems is positive for none.  All other systems are reviewed and negative.   Physical Exam: VS:  BP 122/68   Pulse (!) 104   Ht 5\' 8"  (1.727 m)   Wt 193 lb 3.2 oz (87.6 kg)   SpO2 97%   BMI 29.38 kg/m , BMI Body mass index is 29.38 kg/m.  Wt Readings from Last 3 Encounters:  12/27/22 193 lb 3.2 oz (87.6 kg)  06/14/22 220 lb (99.8 kg)  06/16/19 220 lb (99.8 kg)    General: Patient appears comfortable at rest. HEENT: Conjunctiva and lids normal, oropharynx clear with moist mucosa. Neck: Supple, no elevated JVP or carotid bruits, no thyromegaly. Lungs: Clear to auscultation, nonlabored breathing at rest. Cardiac: Regular rate and rhythm, no S3 or significant systolic murmur, no pericardial rub. Abdomen: Soft, nontender, no hepatomegaly, bowel sounds present, no guarding or rebound. Extremities: No pitting edema, distal pulses 2+. Skin: Warm and dry. Musculoskeletal: No kyphosis. Neuropsychiatric: Alert and oriented x3, affect grossly appropriate.  Recent Labwork: 06/14/2022: ALT 18; AST 30; BUN 23; Creatinine, Ser 1.10; Hemoglobin 15.7; Platelets 168; Potassium 3.7; Sodium 137     Component Value Date/Time   CHOL 119 09/03/2013 0540   TRIG 105 09/03/2013 0540   HDL 47 09/03/2013 0540   CHOLHDL 2.5 09/03/2013 0540   VLDL 21 09/03/2013 0540   LDLCALC 51 09/03/2013 0540      Assessment and Plan:  Persistent atrial fibrillation: HR 104 bpm today, patient has been in persistent atrial fibrillation since 2019.  No indication of rhythm control as he has been in A-fib since 2019. Continue carvedilol 25 mg twice daily, add diltiazem 120 mg once daily and Eliquis 5 mg twice daily.  Will bring him back in 1 week for EKG.  Goal HR at rest less than 90 bpm.  History of stroke in the past. Will need bridging Lovenox in the future for any invasive procedures requiring to hold Eliquis.  Valvular heart disease (mild to moderate AR and mild AS in 2021): Repeat 2D echocardiogram.  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  HTN, controlled: Continue losartan-HCTZ 100-25 mg once daily and carvedilol 25 mg twice daily.  Follows with PCP.  HLD, unknown values: Continue simvastatin 80 mg nightly, goal LDL less than 70.  Follows with PCP.    Medication Adjustments/Labs and Tests Ordered: Current medicines are reviewed at length with the patient today.  Concerns regarding medicines are outlined above.    Disposition:  Follow up 1 week for EKG and 6 months with me  Signed, Makinna Andy Verne Spurr, MD, 12/27/2022 9:36 AM    [facility] at [facility] [address], [place], [place] [address]

## 2023-01-03 ENCOUNTER — Ambulatory Visit: Payer: Self-pay

## 2023-01-31 ENCOUNTER — Other Ambulatory Visit (HOSPITAL_COMMUNITY): Payer: Self-pay

## 2023-02-20 DIAGNOSIS — I5032 Chronic diastolic (congestive) heart failure: Secondary | ICD-10-CM | POA: Diagnosis not present

## 2023-02-20 DIAGNOSIS — N1831 Chronic kidney disease, stage 3a: Secondary | ICD-10-CM | POA: Diagnosis not present

## 2023-02-20 DIAGNOSIS — F330 Major depressive disorder, recurrent, mild: Secondary | ICD-10-CM | POA: Diagnosis not present

## 2023-02-20 DIAGNOSIS — I480 Paroxysmal atrial fibrillation: Secondary | ICD-10-CM | POA: Diagnosis not present

## 2023-02-20 DIAGNOSIS — I130 Hypertensive heart and chronic kidney disease with heart failure and stage 1 through stage 4 chronic kidney disease, or unspecified chronic kidney disease: Secondary | ICD-10-CM | POA: Diagnosis not present

## 2023-02-20 DIAGNOSIS — Z794 Long term (current) use of insulin: Secondary | ICD-10-CM | POA: Diagnosis not present

## 2023-02-20 DIAGNOSIS — I700 Atherosclerosis of aorta: Secondary | ICD-10-CM | POA: Diagnosis not present

## 2023-02-20 DIAGNOSIS — E1140 Type 2 diabetes mellitus with diabetic neuropathy, unspecified: Secondary | ICD-10-CM | POA: Diagnosis not present

## 2023-02-25 ENCOUNTER — Other Ambulatory Visit (HOSPITAL_COMMUNITY): Payer: Self-pay | Admitting: Endocrinology

## 2023-02-25 DIAGNOSIS — I5032 Chronic diastolic (congestive) heart failure: Secondary | ICD-10-CM

## 2023-03-14 ENCOUNTER — Ambulatory Visit (HOSPITAL_COMMUNITY)
Admission: RE | Admit: 2023-03-14 | Discharge: 2023-03-14 | Disposition: A | Discharge status: Home / Self Care | Payer: Medicare HMO | Source: Ambulatory Visit | Attending: Endocrinology | Admitting: Endocrinology

## 2023-03-14 DIAGNOSIS — I5032 Chronic diastolic (congestive) heart failure: Secondary | ICD-10-CM | POA: Diagnosis not present

## 2023-03-14 LAB — ECHOCARDIOGRAM COMPLETE
AR max vel: 0.94 cm2
AV Area VTI: 0.92 cm2
AV Area mean vel: 1.08 cm2
AV Mean grad: 8.6 mm[Hg]
AV Peak grad: 16.7 mm[Hg]
Ao pk vel: 2.04 m/s
Area-P 1/2: 3.72 cm2
P 1/2 time: 730 ms
S' Lateral: 3 cm

## 2023-05-15 NOTE — Progress Notes (Shared)
[facility] - Clinic Note  05/17/2023     CHIEF COMPLAINT Patient presents for No chief complaint on file.   HISTORY OF PRESENT ILLNESS: [name] is a 83 y.o. male who presents to the clinic today for:    Pt is here on the referral of [name] in [place], pt states he has been seeing Glenn. Charise Killian for over 20 years, but this visit was a virtual visit, pt states he went in bc he was having blurry vision, pt has not gotten new glasses yet, pts last A1c is 6.3 on 07.25.23  Referring physician: [name], MD [address] [place],  [place] [address]  HISTORICAL INFORMATION:   Selected notes from the MEDICAL RECORD NUMBER Referred by Glenn. [name] via [facility] [name] for diabetic retinopathy LEE:  Ocular Hx- PMH-    CURRENT MEDICATIONS: Current Outpatient Medications (Ophthalmic Drugs)  Medication Sig   Polyethyl Glycol-Propyl Glycol (SYSTANE) 0.4-0.3 % SOLN Place 1-2 drops into both eyes 3 (three) times daily as needed (dry/irritated eyes.). (Patient not taking: Reported on 12/27/2022)   No current facility-administered medications for this visit. (Ophthalmic Drugs)   Current Outpatient Medications (Other)  Medication Sig   apixaban (ELIQUIS) 5 MG TABS tablet Take 1 tablet (5 mg total) by mouth 2 (two) times daily.   carvedilol (COREG) 25 MG tablet Take 25 mg by mouth 2 (two) times daily with a meal.   Choline Fenofibrate (FENOFIBRIC ACID) 135 MG CPDR Take 135 mg by mouth daily.    diltiazem (CARDIZEM CD) 120 MG 24 hr capsule Take 1 capsule (120 mg total) by mouth daily.   escitalopram (LEXAPRO) 10 MG tablet Take 10 mg by mouth daily. (Patient not taking: Reported on 12/27/2022)   insulin lispro (HUMALOG) 100 UNIT/ML KwikPen SMARTSIG:12 Unit(s) SUB-Q 3 Times Daily   LANTUS SOLOSTAR 100 UNIT/ML Solostar Pen SMARTSIG:30 Unit(s) SUB-Q Daily   losartan-hydrochlorothiazide (HYZAAR) 100-25 MG tablet Take 1 tablet by mouth daily.   mirabegron ER  (MYRBETRIQ) 50 MG TB24 tablet Take 50 mg by mouth daily.   ONETOUCH VERIO test strip SMARTSIG:1 Strip(s) Via Meter 5 Times Daily   simvastatin (ZOCOR) 80 MG tablet Take 80 mg by mouth daily.   tizanidine (ZANAFLEX) 2 MG capsule Take 4 mg by mouth every 8 (eight) hours as needed. (Patient not taking: Reported on 12/27/2022)   tiZANidine (ZANAFLEX) 2 MG tablet Take 2 mg by mouth every 6 (six) hours as needed for muscle spasms. (Patient not taking: Reported on 12/27/2022)   No current facility-administered medications for this visit. (Other)   REVIEW OF SYSTEMS:   ALLERGIES No Known Allergies  PAST MEDICAL HISTORY Past Medical History:  Diagnosis Date   Atrial fibrillation (HCC)    Cholelithiasis 10/2011   Per CT. No cholecystitis radiographically.   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   Shortness of breath    exertion   Sleep apnea    cpap   Stroke [facility]) 2010/2013   Right Brain stroke   Past Surgical History:  Procedure Laterality Date   CARDIOVERSION  07/12/2011   Procedure: CARDIOVERSION;  Surgeon: [name], MD;  Location: [facility] ENDOSCOPY;  Service: Cardiovascular;  Laterality: N/A;  CAROTID DOPPLER STUDY  01/31/2012   no significant extracranial carotid artery stenosis   CATARACT EXTRACTION W/PHACO Left 01/30/2019   Procedure: CATARACT EXTRACTION PHACO AND INTRAOCULAR LENS PLACEMENT (IOC) (CDE: 4.76);  Surgeon: [name], MD;  Location: [facility] ORS;  Service: Ophthalmology;  Laterality: Left;   COLONOSCOPY     DOPPLER ECHOCARDIOGRAPHY  01/31/2012   EF 55-60%, moderately calcified annulus of the mitral valve, left atrium demonstrated mild-moderately dilated   KNEE SURGERY     TEE WITHOUT CARDIOVERSION  07/12/2011    Procedure: TRANSESOPHAGEAL ECHOCARDIOGRAM (TEE);  Surgeon: [name], MD;  Location[facility] ENDOSCOPY;  Service: Cardiovascular;  Laterality: N/A;  to be done at 1330   TONSILLECTOMY     FAMILY HISTORY Family History  Problem Relation Age of Onset   Diabetes Mother    Diabetes Son    Pancreatic cancer Other        GRANDFATHER   Colon cancer Other        INTESTINAL, GRANDMOTHER   SOCIAL HISTORY Social History   Tobacco Use   Smoking status: Never   Smokeless tobacco: Never  Vaping Use   Vaping status: Never Used  Substance Use Topics   Alcohol use: Yes    Alcohol/week: 2.0 standard drinks of alcohol    Types: 2 Glasses of wine per week    Comment: occasionally    Drug use: No       OPHTHALMIC EXAM:  Not recorded    IMAGING AND PROCEDURES  Imaging and Procedures for 05/17/2023          ASSESSMENT/PLAN:  No diagnosis found.  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   - The need for close monitoring of blood glucose, blood pressure, and serum lipids, avoiding cigarette or any type of tobacco, and the need for long term follow up was also discussed with patient. - exam shows cluster of MA superior midzone OS; OD w/ minimal retinopathy - OCT without diabetic macular edema, both eyes  - f/u in 1 yr, sooner prn -- DFE/OCT  2,3. Hypertensive retinopathy OU - discussed importance of tight BP control - monitor  4. Mixed Cataract OD - The symptoms of cataract, surgical options, and treatments and risks were discussed with patient. - discussed diagnosis and progression - approaching visual significance - may benefit from cataract eval  5. Pseudophakia OS  - s/p CE/IOL OS ([name], 10.30.20)  - IOL in good position, doing  well  - monitor  Ophthalmic Meds Ordered this visit:  No orders of the defined types were placed in this encounter.    No follow-ups on file.  There are no Patient Instructions on file for this visit.   This document serves as a record of services personally performed by Karie Chimera, MD, PhD. It was created on their behalf by Berlin Hun COT, an ophthalmic technician. The creation of this record is the provider's dictation and/or activities during the visit.    Electronically signed by: Berlin Hun COT 02.12.2510:07 AM    Abbreviations: M myopia (nearsighted); A astigmatism; H hyperopia (farsighted); P presbyopia; Mrx spectacle prescription;  CTL contact lenses; OD right eye; OS left eye; OU both eyes  XT exotropia; ET esotropia; PEK punctate epithelial keratitis; PEE punctate epithelial erosions; DES dry eye syndrome; MGD meibomian gland dysfunction; ATs artificial  tears; PFAT's preservative free artificial tears; NSC nuclear sclerotic cataract; PSC posterior subcapsular cataract; ERM epi-retinal membrane; PVD posterior vitreous detachment; RD retinal detachment; DM diabetes mellitus; Glenn diabetic retinopathy; NPDR non-proliferative diabetic retinopathy; PDR proliferative diabetic retinopathy; CSME clinically significant macular edema; DME diabetic macular edema; dbh dot blot hemorrhages; CWS cotton wool spot; POAG primary open angle glaucoma; C/D cup-to-disc ratio; HVF humphrey visual field; GVF goldmann visual field; OCT optical coherence tomography; IOP intraocular pressure; BRVO Branch retinal vein occlusion; CRVO central retinal vein occlusion; CRAO central retinal artery occlusion; BRAO branch retinal artery occlusion; RT retinal tear; SB scleral buckle; PPV pars plana vitrectomy; VH Vitreous hemorrhage; PRP panretinal laser photocoagulation; IVK intravitreal kenalog; VMT vitreomacular traction; MH Macular hole;  NVD neovascularization of the disc; NVE neovascularization  elsewhere; AREDS age related eye disease study; ARMD age related macular degeneration; POAG primary open angle glaucoma; EBMD epithelial/anterior basement membrane dystrophy; ACIOL anterior chamber intraocular lens; IOL intraocular lens; PCIOL posterior chamber intraocular lens; Phaco/IOL phacoemulsification with intraocular lens placement; PRK photorefractive keratectomy; LASIK laser assisted in situ keratomileusis; HTN hypertension; DM diabetes mellitus; COPD chronic obstructive pulmonary disease

## 2023-05-17 ENCOUNTER — Encounter (INDEPENDENT_AMBULATORY_CARE_PROVIDER_SITE_OTHER): Payer: Medicare Other | Admitting: Ophthalmology

## 2023-05-17 DIAGNOSIS — H25811 Combined forms of age-related cataract, right eye: Secondary | ICD-10-CM

## 2023-05-17 DIAGNOSIS — I10 Essential (primary) hypertension: Secondary | ICD-10-CM

## 2023-05-17 DIAGNOSIS — E113293 Type 2 diabetes mellitus with mild nonproliferative diabetic retinopathy without macular edema, bilateral: Secondary | ICD-10-CM

## 2023-05-17 DIAGNOSIS — H35033 Hypertensive retinopathy, bilateral: Secondary | ICD-10-CM

## 2023-06-14 ENCOUNTER — Ambulatory Visit: Payer: Self-pay | Admitting: Internal Medicine

## 2023-07-03 ENCOUNTER — Encounter (INDEPENDENT_AMBULATORY_CARE_PROVIDER_SITE_OTHER): Payer: Self-pay | Admitting: Ophthalmology

## 2023-07-03 DIAGNOSIS — H35033 Hypertensive retinopathy, bilateral: Secondary | ICD-10-CM

## 2023-07-03 DIAGNOSIS — E113293 Type 2 diabetes mellitus with mild nonproliferative diabetic retinopathy without macular edema, bilateral: Secondary | ICD-10-CM

## 2023-07-03 DIAGNOSIS — I10 Essential (primary) hypertension: Secondary | ICD-10-CM

## 2023-07-03 DIAGNOSIS — Z961 Presence of intraocular lens: Secondary | ICD-10-CM

## 2023-07-03 DIAGNOSIS — H25811 Combined forms of age-related cataract, right eye: Secondary | ICD-10-CM

## 2023-07-18 DIAGNOSIS — I130 Hypertensive heart and chronic kidney disease with heart failure and stage 1 through stage 4 chronic kidney disease, or unspecified chronic kidney disease: Secondary | ICD-10-CM | POA: Diagnosis not present

## 2023-07-18 DIAGNOSIS — N1831 Chronic kidney disease, stage 3a: Secondary | ICD-10-CM | POA: Diagnosis not present

## 2023-07-18 DIAGNOSIS — E1140 Type 2 diabetes mellitus with diabetic neuropathy, unspecified: Secondary | ICD-10-CM | POA: Diagnosis not present

## 2023-07-18 DIAGNOSIS — E785 Hyperlipidemia, unspecified: Secondary | ICD-10-CM | POA: Diagnosis not present

## 2023-07-18 DIAGNOSIS — Z794 Long term (current) use of insulin: Secondary | ICD-10-CM | POA: Diagnosis not present

## 2023-08-15 DIAGNOSIS — E785 Hyperlipidemia, unspecified: Secondary | ICD-10-CM | POA: Diagnosis not present

## 2023-08-15 DIAGNOSIS — Z794 Long term (current) use of insulin: Secondary | ICD-10-CM | POA: Diagnosis not present

## 2023-08-15 DIAGNOSIS — N1831 Chronic kidney disease, stage 3a: Secondary | ICD-10-CM | POA: Diagnosis not present

## 2023-08-15 DIAGNOSIS — I130 Hypertensive heart and chronic kidney disease with heart failure and stage 1 through stage 4 chronic kidney disease, or unspecified chronic kidney disease: Secondary | ICD-10-CM | POA: Diagnosis not present

## 2023-08-15 DIAGNOSIS — Z7901 Long term (current) use of anticoagulants: Secondary | ICD-10-CM | POA: Diagnosis not present

## 2023-08-15 DIAGNOSIS — I5032 Chronic diastolic (congestive) heart failure: Secondary | ICD-10-CM | POA: Diagnosis not present

## 2023-08-15 DIAGNOSIS — E1140 Type 2 diabetes mellitus with diabetic neuropathy, unspecified: Secondary | ICD-10-CM | POA: Diagnosis not present

## 2023-08-15 DIAGNOSIS — I480 Paroxysmal atrial fibrillation: Secondary | ICD-10-CM | POA: Diagnosis not present

## 2023-08-30 ENCOUNTER — Other Ambulatory Visit: Payer: Self-pay

## 2023-08-30 ENCOUNTER — Emergency Department (HOSPITAL_COMMUNITY)
Admission: EM | Admit: 2023-08-30 | Discharge: 2023-08-31 | Disposition: A | Discharge status: Home / Self Care | Attending: Emergency Medicine | Admitting: Emergency Medicine

## 2023-08-30 ENCOUNTER — Encounter (HOSPITAL_COMMUNITY): Payer: Self-pay | Admitting: Emergency Medicine

## 2023-08-30 DIAGNOSIS — Z7901 Long term (current) use of anticoagulants: Secondary | ICD-10-CM | POA: Insufficient documentation

## 2023-08-30 DIAGNOSIS — E1165 Type 2 diabetes mellitus with hyperglycemia: Secondary | ICD-10-CM | POA: Insufficient documentation

## 2023-08-30 DIAGNOSIS — R739 Hyperglycemia, unspecified: Secondary | ICD-10-CM | POA: Diagnosis present

## 2023-08-30 DIAGNOSIS — Z794 Long term (current) use of insulin: Secondary | ICD-10-CM | POA: Insufficient documentation

## 2023-08-30 LAB — CBG MONITORING, ED: Glucose-Capillary: 150 mg/dL — ABNORMAL HIGH (ref 70–99)

## 2023-08-30 NOTE — ED Triage Notes (Signed)
 Pt arrives via POV with concern for not being able to check blood sugar after removing old continuous glucose reader and not being able to place the new one. BGL 150 here, pt denies any complaints. New device at bedside.

## 2023-08-30 NOTE — ED Provider Notes (Signed)
 [place] EMERGENCY DEPARTMENT AT [facility] Provider Note   CSN: [id number] Arrival date & time: 08/30/23  2328     History  Chief Complaint  Patient presents with   Blood Sugar Screening    [name] is a 83 y.o. male.  Patient is an 83 year old male with past medical history of type 2 diabetes presenting for concerns of possible elevated blood sugar.  Patient was recently given a libre glucometer device and wife has been having difficulty figuring this out.  She checked his sugar at home and it was over 200.  He was given a dose of insulin , then presents here due to concerns of having difficulty with the new device.  Patient denies to me he is experiencing any symptoms whatsoever.       Home Medications Prior to Admission medications   Medication Sig Start Date End Date Taking? Authorizing Provider  apixaban  (ELIQUIS ) 5 MG TABS tablet Take 1 tablet (5 mg total) by mouth 2 (two) times daily. 10/30/13   Hilty, Aviva Lemmings, MD  carvedilol  (COREG ) 25 MG tablet Take 25 mg by mouth 2 (two) times daily with a meal.    [provider]  Choline Fenofibrate  (FENOFIBRIC ACID ) 135 MG CPDR Take 135 mg by mouth daily.     [provider]  diltiazem  (CARDIZEM  CD) 120 MG 24 hr capsule Take 1 capsule (120 mg total) by mouth daily. 12/27/22   Mallipeddi, Vishnu P, MD  escitalopram  (LEXAPRO ) 10 MG tablet Take 10 mg by mouth daily. Patient not taking: Reported on 12/27/2022    [provider]  insulin  lispro (HUMALOG) 100 UNIT/ML KwikPen SMARTSIG:12 Unit(s) SUB-Q 3 Times Daily 10/28/19   [provider]  LANTUS  SOLOSTAR 100 UNIT/ML Solostar Pen SMARTSIG:30 Unit(s) SUB-Q Daily 10/19/19   [provider]  losartan -hydrochlorothiazide  (HYZAAR) 100-25 MG tablet Take 1 tablet by mouth daily. 07/24/17   [provider]  mirabegron  ER (MYRBETRIQ ) 50 MG TB24 tablet Take 50 mg by mouth daily.    [provider]  Alisia Irons test strip  SMARTSIG:1 Strip(s) Via Meter 5 Times Daily 01/15/19   [provider]  Polyethyl Glycol-Propyl Glycol (SYSTANE) 0.4-0.3 % SOLN Place 1-2 drops into both eyes 3 (three) times daily as needed (dry/irritated eyes.). Patient not taking: Reported on 12/27/2022    [provider]  simvastatin (ZOCOR) 80 MG tablet Take 80 mg by mouth daily.    [provider]  tizanidine (ZANAFLEX) 2 MG capsule Take 4 mg by mouth every 8 (eight) hours as needed. Patient not taking: Reported on 12/27/2022 07/04/19   [provider]  tiZANidine (ZANAFLEX) 2 MG tablet Take 2 mg by mouth every 6 (six) hours as needed for muscle spasms. Patient not taking: Reported on 12/27/2022    [provider]      Allergies    Patient has no known allergies.    Review of Systems   Review of Systems  All other systems reviewed and are negative.   Physical Exam Updated Vital Signs BP 139/76 (BP Location: Left Arm)   Pulse 92   Temp (!) 97.2 F (36.2 C) (Tympanic)   Resp (!) 24   Ht 5\' 8"  (1.727 m)   Wt 87 kg   SpO2 96%   BMI 29.16 kg/m  Physical Exam Vitals and nursing note reviewed.  Constitutional:      Appearance: Normal appearance.  Pulmonary:     Effort: Pulmonary effort is normal.  Skin:  General: Skin is warm and dry.  Neurological:     Mental Status: He is alert and oriented to person, place, and time.     ED Results / Procedures / Treatments   Labs (all labs ordered are listed, but only abnormal results are displayed) Labs Reviewed  CBG MONITORING, ED - Abnormal; Notable for the following components:      Result Value   Glucose-Capillary 150 (*)    All other components within normal limits    EKG None  Radiology No results found.  Procedures Procedures    Medications Ordered in ED Medications - No data to display  ED Course/ Medical Decision Making/ A&P  Patient presenting with elevated blood sugars as described in the HPI.  Patient found  to have a broken freestyle libre device.  His blood sugar here is 150 and I feel can safely be discharged.  A new prescription for the device was sent to the pharmacy for the patient to pick up in the morning.  Final Clinical Impression(s) / ED Diagnoses Final diagnoses:  None    Rx / DC Orders ED Discharge Orders     None         Orvilla Blander, MD 08/31/23 [phone]

## 2023-08-31 MED ORDER — FREESTYLE LIBRE 3 PLUS SENSOR MISC: 1 refills | Status: DC

## 2023-08-31 NOTE — ED Notes (Signed)
 Glucose screening device brought in by family appears to be damaged and unusable. MD made aware. Wife requesting assistance to send a new prescription in to pharmacy to pick up in am. Family aware not to give any more insulin  until able to check blood sugar in am.

## 2023-08-31 NOTE — Discharge Instructions (Signed)
 Continue medications as previously prescribed.  Return to the emergency department for any new and/or concerning issues.

## 2023-08-31 NOTE — ED Notes (Signed)
 RN recommended wife to pick up back up glucometer and ask pharmacist for assistance applying the new sensing device when picking up in the am. Wife and pt feel comfortable with plan.

## 2023-11-29 ENCOUNTER — Other Ambulatory Visit (HOSPITAL_COMMUNITY): Payer: Self-pay | Admitting: *Deleted

## 2023-11-29 ENCOUNTER — Encounter (HOSPITAL_COMMUNITY): Payer: Self-pay | Admitting: Family Medicine

## 2023-11-29 ENCOUNTER — Observation Stay (HOSPITAL_COMMUNITY)

## 2023-11-29 ENCOUNTER — Emergency Department (HOSPITAL_COMMUNITY)

## 2023-11-29 ENCOUNTER — Observation Stay (HOSPITAL_BASED_OUTPATIENT_CLINIC_OR_DEPARTMENT_OTHER)

## 2023-11-29 ENCOUNTER — Observation Stay (HOSPITAL_COMMUNITY)
Admission: EM | Admit: 2023-11-29 | Discharge: 2023-11-30 | Disposition: A | Discharge status: Skilled Nursing Facility | Attending: Family Medicine | Admitting: Family Medicine

## 2023-11-29 DIAGNOSIS — I771 Stricture of artery: Secondary | ICD-10-CM | POA: Diagnosis not present

## 2023-11-29 DIAGNOSIS — E119 Type 2 diabetes mellitus without complications: Secondary | ICD-10-CM | POA: Diagnosis not present

## 2023-11-29 DIAGNOSIS — I6381 Other cerebral infarction due to occlusion or stenosis of small artery: Secondary | ICD-10-CM | POA: Diagnosis not present

## 2023-11-29 DIAGNOSIS — I480 Paroxysmal atrial fibrillation: Secondary | ICD-10-CM | POA: Diagnosis not present

## 2023-11-29 DIAGNOSIS — R262 Difficulty in walking, not elsewhere classified: Secondary | ICD-10-CM | POA: Diagnosis not present

## 2023-11-29 DIAGNOSIS — Z8679 Personal history of other diseases of the circulatory system: Secondary | ICD-10-CM | POA: Diagnosis not present

## 2023-11-29 DIAGNOSIS — R2689 Other abnormalities of gait and mobility: Secondary | ICD-10-CM | POA: Insufficient documentation

## 2023-11-29 DIAGNOSIS — I6623 Occlusion and stenosis of bilateral posterior cerebral arteries: Secondary | ICD-10-CM | POA: Diagnosis not present

## 2023-11-29 DIAGNOSIS — R4182 Altered mental status, unspecified: Secondary | ICD-10-CM | POA: Diagnosis not present

## 2023-11-29 DIAGNOSIS — I700 Atherosclerosis of aorta: Secondary | ICD-10-CM | POA: Diagnosis not present

## 2023-11-29 DIAGNOSIS — I10 Essential (primary) hypertension: Secondary | ICD-10-CM | POA: Diagnosis not present

## 2023-11-29 DIAGNOSIS — I4819 Other persistent atrial fibrillation: Secondary | ICD-10-CM | POA: Diagnosis not present

## 2023-11-29 DIAGNOSIS — Z79899 Other long term (current) drug therapy: Secondary | ICD-10-CM | POA: Diagnosis not present

## 2023-11-29 DIAGNOSIS — I6782 Cerebral ischemia: Secondary | ICD-10-CM | POA: Diagnosis not present

## 2023-11-29 DIAGNOSIS — Z8673 Personal history of transient ischemic attack (TIA), and cerebral infarction without residual deficits: Secondary | ICD-10-CM | POA: Diagnosis not present

## 2023-11-29 DIAGNOSIS — G459 Transient cerebral ischemic attack, unspecified: Secondary | ICD-10-CM | POA: Diagnosis not present

## 2023-11-29 DIAGNOSIS — R4781 Slurred speech: Secondary | ICD-10-CM | POA: Diagnosis not present

## 2023-11-29 DIAGNOSIS — I340 Nonrheumatic mitral (valve) insufficiency: Secondary | ICD-10-CM | POA: Diagnosis not present

## 2023-11-29 DIAGNOSIS — R4701 Aphasia: Principal | ICD-10-CM | POA: Diagnosis present

## 2023-11-29 DIAGNOSIS — Z794 Long term (current) use of insulin: Secondary | ICD-10-CM | POA: Insufficient documentation

## 2023-11-29 LAB — CBC
HCT: 42.4 % (ref 39.0–52.0)
Hemoglobin: 14.1 g/dL (ref 13.0–17.0)
MCH: 31.3 pg (ref 26.0–34.0)
MCHC: 33.3 g/dL (ref 30.0–36.0)
MCV: 94 fL (ref 80.0–100.0)
Platelets: 156 K/uL (ref 150–400)
RBC: 4.51 MIL/uL (ref 4.22–5.81)
RDW: 12.4 % (ref 11.5–15.5)
WBC: 5.6 K/uL (ref 4.0–10.5)
nRBC: 0 % (ref 0.0–0.2)

## 2023-11-29 LAB — ECHOCARDIOGRAM COMPLETE
AR max vel: 1.18 cm2
AV Area VTI: 1.21 cm2
AV Area mean vel: 1.24 cm2
AV Mean grad: 10.5 mmHg
AV Peak grad: 21.7 mmHg
Ao pk vel: 2.33 m/s
Area-P 1/2: 3.12 cm2
P 1/2 time: 692 ms
S' Lateral: 2.5 cm
Weight: 2910.07 [oz_av]

## 2023-11-29 LAB — DIFFERENTIAL
Abs Immature Granulocytes: 0.02 K/uL (ref 0.00–0.07)
Basophils Absolute: 0 K/uL (ref 0.0–0.1)
Basophils Relative: 1 %
Eosinophils Absolute: 0.1 K/uL (ref 0.0–0.5)
Eosinophils Relative: 1 %
Immature Granulocytes: 0 %
Lymphocytes Relative: 24 %
Lymphs Abs: 1.3 K/uL (ref 0.7–4.0)
Monocytes Absolute: 0.7 K/uL (ref 0.1–1.0)
Monocytes Relative: 12 %
Neutro Abs: 3.5 K/uL (ref 1.7–7.7)
Neutrophils Relative %: 62 %

## 2023-11-29 LAB — LIPID PANEL
Cholesterol: 128 mg/dL (ref 0–200)
HDL: 44 mg/dL (ref >40)
LDL Cholesterol: 74 mg/dL (ref 0–99)
Total CHOL/HDL Ratio: 2.9 ratio
Triglycerides: 52 mg/dL (ref <150)
VLDL: 10 mg/dL (ref 0–40)

## 2023-11-29 LAB — COMPREHENSIVE METABOLIC PANEL WITH GFR
ALT: 15 U/L (ref 0–44)
AST: 26 U/L (ref 15–41)
Albumin: 3.7 g/dL (ref 3.5–5.0)
Alkaline Phosphatase: 46 U/L (ref 38–126)
Anion gap: 10 (ref 5–15)
BUN: 25 mg/dL — ABNORMAL HIGH (ref 8–23)
CO2: 25 mmol/L (ref 22–32)
Calcium: 9.3 mg/dL (ref 8.9–10.3)
Chloride: 104 mmol/L (ref 98–111)
Creatinine, Ser: 1.19 mg/dL (ref 0.61–1.24)
GFR, Estimated: >60 mL/min (ref >60)
Glucose, Bld: 135 mg/dL — ABNORMAL HIGH (ref 70–99)
Potassium: 3.9 mmol/L (ref 3.5–5.1)
Sodium: 139 mmol/L (ref 135–145)
Total Bilirubin: 1.3 mg/dL — ABNORMAL HIGH (ref 0.0–1.2)
Total Protein: 6.3 g/dL — ABNORMAL LOW (ref 6.5–8.1)

## 2023-11-29 LAB — RAPID URINE DRUG SCREEN, HOSP PERFORMED
Amphetamines: NOT DETECTED
Barbiturates: NOT DETECTED
Benzodiazepines: NOT DETECTED
Cocaine: NOT DETECTED
Opiates: NOT DETECTED
Tetrahydrocannabinol: NOT DETECTED

## 2023-11-29 LAB — GLUCOSE, CAPILLARY
Glucose-Capillary: 114 mg/dL — ABNORMAL HIGH (ref 70–99)
Glucose-Capillary: 129 mg/dL — ABNORMAL HIGH (ref 70–99)
Glucose-Capillary: 134 mg/dL — ABNORMAL HIGH (ref 70–99)
Glucose-Capillary: 150 mg/dL — ABNORMAL HIGH (ref 70–99)
Glucose-Capillary: 197 mg/dL — ABNORMAL HIGH (ref 70–99)
Glucose-Capillary: 162 mg/dL — ABNORMAL HIGH (ref 70–99)

## 2023-11-29 LAB — APTT: aPTT: 35 s (ref 24–36)

## 2023-11-29 LAB — PROTIME-INR
INR: 1.3 — ABNORMAL HIGH (ref 0.8–1.2)
Prothrombin Time: 16.8 s — ABNORMAL HIGH (ref 11.4–15.2)

## 2023-11-29 LAB — ETHANOL: Alcohol, Ethyl (B): <15 mg/dL (ref <15)

## 2023-11-29 MED ORDER — FENOFIBRATE 160 MG PO TABS
160.0000 mg | ORAL_TABLET | Freq: Every day | ORAL | Status: DC
Start: 2023-11-30 — End: 2023-11-30
  Administered 2023-11-30: 160 mg via ORAL
  Filled 2023-11-29: qty 1

## 2023-11-29 MED ORDER — SALINE SPRAY 0.65 % NA SOLN: 1.0000 | NASAL | Status: DC | PRN

## 2023-11-29 MED ORDER — ACETAMINOPHEN 160 MG/5ML PO SOLN: 650.0000 mg | ORAL | Status: DC | PRN

## 2023-11-29 MED ORDER — GUAIFENESIN-DM 100-10 MG/5ML PO SYRP
5.0000 mL | ORAL_SOLUTION | ORAL | Status: DC | PRN
  Administered 2023-11-29: 5 mL via ORAL
  Filled 2023-11-29: qty 5

## 2023-11-29 MED ORDER — ALUM & MAG HYDROXIDE-SIMETH 200-200-20 MG/5ML PO SUSP: 30.0000 mL | ORAL | Status: DC | PRN

## 2023-11-29 MED ORDER — STROKE: EARLY STAGES OF RECOVERY BOOK: Freq: Once | Status: AC

## 2023-11-29 MED ORDER — ASPIRIN 81 MG PO TBEC
81.0000 mg | DELAYED_RELEASE_TABLET | Freq: Every day | ORAL | Status: DC
  Administered 2023-11-29 – 2023-11-30 (×2): 81 mg via ORAL
  Filled 2023-11-29 (×2): qty 1

## 2023-11-29 MED ORDER — HYDROCORTISONE (PERIANAL) 2.5 % EX CREA: 1.0000 | TOPICAL_CREAM | Freq: Four times a day (QID) | CUTANEOUS | Status: DC | PRN

## 2023-11-29 MED ORDER — ONDANSETRON HCL 4 MG/2ML IJ SOLN: 4.0000 mg | Freq: Four times a day (QID) | INTRAMUSCULAR | Status: DC | PRN

## 2023-11-29 MED ORDER — MIRABEGRON ER 25 MG PO TB24
50.0000 mg | ORAL_TABLET | Freq: Every day | ORAL | Status: DC
  Administered 2023-11-29 – 2023-11-30 (×2): 50 mg via ORAL
  Filled 2023-11-29 (×2): qty 2

## 2023-11-29 MED ORDER — LORATADINE 10 MG PO TABS: 10.0000 mg | ORAL_TABLET | Freq: Every day | ORAL | Status: DC | PRN

## 2023-11-29 MED ORDER — LOSARTAN POTASSIUM-HCTZ 100-25 MG PO TABS: 1.0000 | ORAL_TABLET | Freq: Every day | ORAL | Status: DC

## 2023-11-29 MED ORDER — INSULIN ASPART 100 UNIT/ML IJ SOLN
0.0000 [IU] | Freq: Three times a day (TID) | INTRAMUSCULAR | Status: DC
  Administered 2023-11-29 – 2023-11-30 (×3): 1 [IU] via SUBCUTANEOUS

## 2023-11-29 MED ORDER — LOSARTAN POTASSIUM 50 MG PO TABS
100.0000 mg | ORAL_TABLET | Freq: Every day | ORAL | Status: DC
  Administered 2023-11-29 – 2023-11-30 (×2): 100 mg via ORAL
  Filled 2023-11-29 (×2): qty 2

## 2023-11-29 MED ORDER — POLYVINYL ALCOHOL 1.4 % OP SOLN: 1.0000 [drp] | OPHTHALMIC | Status: DC | PRN

## 2023-11-29 MED ORDER — MUSCLE RUB 10-15 % EX CREA: 1.0000 | TOPICAL_CREAM | CUTANEOUS | Status: DC | PRN

## 2023-11-29 MED ORDER — APIXABAN 5 MG PO TABS
5.0000 mg | ORAL_TABLET | Freq: Two times a day (BID) | ORAL | Status: DC
  Administered 2023-11-29 – 2023-11-30 (×3): 5 mg via ORAL
  Filled 2023-11-29 (×3): qty 1

## 2023-11-29 MED ORDER — INSULIN GLARGINE-YFGN 100 UNIT/ML ~~LOC~~ SOLN
7.0000 [IU] | Freq: Every day | SUBCUTANEOUS | Status: DC
  Administered 2023-11-30: 7 [IU] via SUBCUTANEOUS
  Filled 2023-11-29 (×3): qty 0.07

## 2023-11-29 MED ORDER — IOHEXOL 350 MG/ML SOLN
100.0000 mL | Freq: Once | INTRAVENOUS | Status: AC | PRN
  Administered 2023-11-29: 100 mL via INTRAVENOUS

## 2023-11-29 MED ORDER — SENNOSIDES-DOCUSATE SODIUM 8.6-50 MG PO TABS: 1.0000 | ORAL_TABLET | Freq: Every evening | ORAL | Status: DC | PRN

## 2023-11-29 MED ORDER — SODIUM CHLORIDE 0.9 % IV SOLN: INTRAVENOUS | Status: AC

## 2023-11-29 MED ORDER — INSULIN GLARGINE 100 UNIT/ML ~~LOC~~ SOLN
7.0000 [IU] | Freq: Every day | SUBCUTANEOUS | Status: DC
  Filled 2023-11-29 (×2): qty 0.07

## 2023-11-29 MED ORDER — CARVEDILOL 12.5 MG PO TABS
25.0000 mg | ORAL_TABLET | Freq: Two times a day (BID) | ORAL | Status: DC
  Administered 2023-11-29 – 2023-11-30 (×2): 25 mg via ORAL
  Filled 2023-11-29 (×2): qty 2

## 2023-11-29 MED ORDER — PHENOL 1.4 % MT LIQD: 1.0000 | OROMUCOSAL | Status: DC | PRN

## 2023-11-29 MED ORDER — ENOXAPARIN SODIUM 40 MG/0.4ML IJ SOSY
40.0000 mg | PREFILLED_SYRINGE | INTRAMUSCULAR | Status: DC
  Administered 2023-11-29: 40 mg via SUBCUTANEOUS
  Filled 2023-11-29: qty 0.4

## 2023-11-29 MED ORDER — INSULIN GLARGINE-YFGN 100 UNIT/ML ~~LOC~~ SOLN: 7.0000 [IU] | Freq: Every day | SUBCUTANEOUS | Status: DC

## 2023-11-29 MED ORDER — ACETAMINOPHEN 650 MG RE SUPP: 650.0000 mg | RECTAL | Status: DC | PRN

## 2023-11-29 MED ORDER — HYDROCORTISONE 1 % EX CREA: 1.0000 | TOPICAL_CREAM | Freq: Three times a day (TID) | CUTANEOUS | Status: DC | PRN

## 2023-11-29 MED ORDER — INSULIN ASPART 100 UNIT/ML IJ SOLN: 0.0000 [IU] | Freq: Every day | INTRAMUSCULAR | Status: DC

## 2023-11-29 MED ORDER — ACETAMINOPHEN 325 MG PO TABS
650.0000 mg | ORAL_TABLET | ORAL | Status: DC | PRN
Start: 2023-11-29 — End: 2023-11-30

## 2023-11-29 MED ORDER — ESCITALOPRAM OXALATE 10 MG PO TABS
10.0000 mg | ORAL_TABLET | Freq: Every day | ORAL | Status: DC
  Administered 2023-11-29 – 2023-11-30 (×2): 10 mg via ORAL
  Filled 2023-11-29 (×2): qty 1

## 2023-11-29 MED ORDER — DILTIAZEM HCL ER COATED BEADS 120 MG PO CP24: 120.0000 mg | ORAL_CAPSULE | Freq: Every day | ORAL | Status: DC

## 2023-11-29 MED ORDER — ATORVASTATIN CALCIUM 40 MG PO TABS
40.0000 mg | ORAL_TABLET | Freq: Every day | ORAL | Status: DC
  Administered 2023-11-29 – 2023-11-30 (×2): 40 mg via ORAL
  Filled 2023-11-29 (×2): qty 1

## 2023-11-29 MED ORDER — CARMEX CLASSIC LIP BALM EX OINT: 1.0000 | TOPICAL_OINTMENT | CUTANEOUS | Status: DC | PRN

## 2023-11-29 MED ORDER — HYDROCHLOROTHIAZIDE 25 MG PO TABS
25.0000 mg | ORAL_TABLET | Freq: Every day | ORAL | Status: DC
  Administered 2023-11-29 – 2023-11-30 (×2): 25 mg via ORAL
  Filled 2023-11-29 (×2): qty 1

## 2023-11-29 NOTE — Care Management Obs Status (Signed)
 MEDICARE OBSERVATION STATUS NOTIFICATION   Patient Details  Name: [name] MRN: [id number] Date of Birth: Apr 23, 1940   Medicare Observation Status Notification Given:  Yes    Nena LITTIE Coffee, RN 11/29/2023, 5:22 PM

## 2023-11-29 NOTE — ED Notes (Signed)
 Pt reports he feels like he can't get his words out right.

## 2023-11-29 NOTE — Progress Notes (Signed)
   11/29/23 0600  Pre-Screen- If YES to any of the following, STOP the screen, keep NPO, and place order for SLP eval and treat.   Home diet required thickened liquids No - Proceed  Trach tube present No - Proceed  Radiation to Head/Neck No - Proceed  Patient Readiness- If YES to any of the following, WAIT to screen, keep NPO, and place order for SLP eval and treat. May rescreen if clinical improvement WITHIN 24h.    Is patient lethargic or unable to stay alert/awake? No - Proceed  HOB restricted to <30 degrees (!) Yes Keep NPO/Place SLP Eval  NPO for planned procedure No - Proceed  Aspiration Risk Assessment  Is patient oriented to name, place, or year? Yes - Proceed  Able to open mouth, stick out tongue, or smile Yes - Proceed  Able to seal lips Yes - Proceed  Able to move tongue from side to side Yes - Proceed  Face is symmetric No - Proceed  3 oz Water Challenge  Does the patient stop drinking? No - Proceed  Does the patient cough/choke? (!) Yes Keep NPO/Place SLP Eval  Document PASS or FAIL (!) FAIL Keep NPO/Place SLP Eval Order

## 2023-11-29 NOTE — Evaluation (Addendum)
 Occupational Therapy Evaluation Patient Details Name: [name] MRN: [id number] DOB: 12-May-1940 Today's Date: 11/29/2023   History of Present Illness   [name] is an 83 y.o. male with medical history significant for hypertension, type 2 diabetes mellitus, atrial fibrillation on Eliquis , and history of CVA who presents with speech difficulty.     Patient was reportedly in his usual state when he went to bed last night at 10 PM but apparently fell out of bed early this morning and was noted to have difficulty speaking.  Patient denies headache, focal numbness or weakness, and does not perceive any difficulty with his speech at time of admission.  He does not remember having trouble speaking earlier either. (per MD)     Clinical Impressions Pt agreeable to OT and PT co-evaluation. No family available to confirm baseline cognition. Pt mildly confused today with extra cuing needed to follow commands. L UE weak at the shoulder with B FM and GM coordination deficits. Unsure of what is baseline versus what is new. Mild word find difficulty and slow rate of speech today. CGA to min A for mobility with RW. Set up to [facility] for ADL's based on observation and clinical judgement today. Possible R visual field impairment, but more assessment needed. Pt will benefit from continued OT in the hospital and recommended venue below to increase strength, balance, and endurance for safe ADL's.        If plan is discharge home, recommend the following:   A little help with walking and/or transfers;A little help with bathing/dressing/bathroom;Assistance with cooking/housework;Assist for transportation;Help with stairs or ramp for entrance     Functional Status Assessment   Patient has had a recent decline in their functional status and demonstrates the ability to make significant improvements in function in a reasonable and predictable amount of time.     Equipment Recommendations   None recommended by  OT     Recommendations for Other Services   Other (comment) (Evaluation from vision specialist.)     Precautions/Restrictions   Precautions Precautions: Fall Recall of Precautions/Restrictions: Impaired Restrictions Weight Bearing Restrictions Per Provider Order: No     Mobility Bed Mobility Overal bed mobility: Needs Assistance Bed Mobility: Supine to Sit     Supine to sit: HOB elevated, Min assist     General bed mobility comments: mild labored effort    Transfers Overall transfer level: Needs assistance Equipment used: Rolling walker (2 wheels) Transfers: Sit to/from Stand, Bed to chair/wheelchair/BSC Sit to Stand: Contact guard assist     Step pivot transfers: Contact guard assist, Min assist     General transfer comment: Cuing for RW management; extended time.      Balance Overall balance assessment: Needs assistance Sitting-balance support: Feet supported, No upper extremity supported Sitting balance-Leahy Scale: Fair Sitting balance - Comments: fair to good seated at EOB   Standing balance support: Bilateral upper extremity supported, During functional activity, Reliant on assistive device for balance Standing balance-Leahy Scale: Fair Standing balance comment: using RW                           ADL either performed or assessed with clinical judgement   ADL Overall ADL's : Needs assistance/impaired     Grooming: Set up;Sitting   Upper Body Bathing: Set up;Sitting   Lower Body Bathing: Contact guard assist;Sitting/lateral leans   Upper Body Dressing : Set up;Sitting   Lower Body Dressing: Contact guard assist;Sitting/lateral leans  Lower Body Dressing Details (indicate cue type and reason): Able to doff and don sock seated at EOB. Toilet Transfer: Contact guard assist;Minimal assistance;Rolling walker (2 wheels);Ambulation;Stand-pivot Toilet Transfer Details (indicate cue type and reason): EOB to chair with RW Toileting- Clothing  Manipulation and Hygiene: Contact guard assist;Sitting/lateral lean       Functional mobility during ADLs: Contact guard assist;Minimal assistance;Rolling walker (2 wheels) General ADL Comments: Short distance ambulation just outside of the room.     Vision Baseline Vision/History: 1 Wears glasses (blurry vision for past 3 weeks) Ability to See in Adequate Light: 2 Moderately impaired Patient Visual Report: Blurring of vision (past 3 weeks; pt had made an eye appointment.) Vision Assessment?: Vision impaired- to be further tested in functional context;Yes Eye Alignment: Within Functional Limits Tracking/Visual Pursuits: Other (comment) (Mild difficulty maintaining tracking in L upper quadrant.) Convergence: Within functional limits Visual Fields: Right visual field deficit (Possible R visual field deficit. Difficulty to tell what is vision versus cognition.)     Perception Perception: Not tested       Praxis Praxis: Not tested       Pertinent Vitals/Pain Pain Assessment Pain Assessment: No/denies pain     Extremity/Trunk Assessment Upper Extremity Assessment Upper Extremity Assessment: RUE deficits/detail;LUE deficits/detail RUE Deficits / Details: 4/5 shoulder flexion and abduction which pt reports is baseline. Slow FM coordination ; mild gross motor difficulty as well. RUE Sensation: WNL RUE Coordination: decreased fine motor;decreased gross motor LUE Deficits / Details: Generally weak. LUE Sensation: WNL LUE Coordination: decreased fine motor;decreased gross motor   Lower Extremity Assessment Lower Extremity Assessment: Defer to PT evaluation   Cervical / Trunk Assessment Cervical / Trunk Assessment: Kyphotic   Communication Communication Communication: Impaired Factors Affecting Communication: Difficulty expressing self;Other (comment) (Slow communication; possible word finding difficulty.)   Cognition Arousal: Alert Behavior During Therapy: WFL for tasks  assessed/performed Cognition: No family/caregiver present to determine baseline, Cognition impaired   Orientation impairments: Time       Executive functioning impairment (select all impairments): Sequencing OT - Cognition Comments: Pt oriented to name, location, but not time.                 Following commands: Impaired Following commands impaired: Follows one step commands with increased time (Increased cuing needed.)     Cueing  General Comments   Cueing Techniques: Verbal cues;Tactile cues;Gestural cues;Visual cues      Exercises     Shoulder Instructions      Home Living Family/patient expects to be discharged to:: Assisted living                             Home Equipment: Rolling Walker (2 wheels);Cane - quad;BSC/3in1   Additional Comments: Pt reports he lives with his wife who is available 24/7.      Prior Functioning/Environment Prior Level of Function : Needs assist       Physical Assist : ADLs (physical)   ADLs (physical): IADLs Mobility Comments: Community ambulator with RW ADLs Comments: Assist IADL's from ALF staff; independent ADL's.    OT Problem List: Decreased strength;Decreased activity tolerance;Impaired balance (sitting and/or standing);Impaired vision/perception;Decreased cognition;Decreased coordination   OT Treatment/Interventions: Self-care/ADL training;Therapeutic exercise;Therapeutic activities;Patient/family education;Cognitive remediation/compensation;Visual/perceptual remediation/compensation      OT Goals(Current goals can be found in the care plan section)   Acute Rehab OT Goals Patient Stated Goal: return home OT Goal Formulation: With patient Time For Goal Achievement: 12/13/23 Potential to Achieve  Goals: Good   OT Frequency:  Min 3X/week    Co-evaluation PT/OT/SLP Co-Evaluation/Treatment: Yes Reason for Co-Treatment: To address functional/ADL transfers   OT goals addressed during session: ADL's  and self-care      AM-PAC OT 6 Clicks Daily Activity     Outcome Measure Help from another person eating meals?: None Help from another person taking care of personal grooming?: A Little Help from another person toileting, which includes using toliet, bedpan, or urinal?: A Little Help from another person bathing (including washing, rinsing, drying)?: A Little Help from another person to put on and taking off regular upper body clothing?: A Little Help from another person to put on and taking off regular lower body clothing?: A Little 6 Click Score: 19   End of Session Equipment Utilized During Treatment: Rolling walker (2 wheels);Gait belt  Activity Tolerance: Patient tolerated treatment well Patient left: in chair;with call bell/phone within reach;with chair alarm set  OT Visit Diagnosis: Unsteadiness on feet (R26.81);Other abnormalities of gait and mobility (R26.89);Muscle weakness (generalized) (M62.81);Other symptoms and signs involving cognitive function                Time: 9159-9080 OT Time Calculation (min): 39 min Charges:  OT General Charges $OT Visit: 1 Visit OT Evaluation $OT Eval Low Complexity: 1 Low  Najiyah Paris OT, MOT   Jayson Person 11/29/2023, 11:09 AM

## 2023-11-29 NOTE — Hospital Course (Signed)
 83 y.o. male with medical history significant for hypertension, type 2 diabetes mellitus, atrial fibrillation on Eliquis , and history of CVA who presents with speech difficulty.   Patient was reportedly in his usual state when he went to bed last night at 10 PM but apparently fell out of bed early this morning and was noted to have difficulty speaking.  Patient denies headache, focal numbness or weakness, and does not perceive any difficulty with his speech at time of admission.  He does not remember having trouble speaking earlier either.   ED Course: Upon arrival to the ED, patient is found to be afebrile and saturating well on room air with normal HR and stable BP.  There is no acute finding on head CT and no large vessel occlusion on CTA of the head and neck.   Patient was evaluated by teleneurologist in the emergency department and it was recommended that he be admitted for stroke workup.     He passed a stroke swallow screen in the ED.

## 2023-11-29 NOTE — H&P (Signed)
 History and Physical    [name] FMW:[id number] DOB: 1940-10-02 DOA: 11/29/2023  PCP: Nichole Senior, MD   Patient coming from: ILF     Chief Complaint: Speech difficulty   HPI: [name] is an 83 y.o. male with medical history significant for hypertension, type 2 diabetes mellitus, atrial fibrillation on Eliquis , and history of CVA who presents with speech difficulty.  Patient was reportedly in his usual state when he went to bed last night at 10 PM but apparently fell out of bed early this morning and was noted to have difficulty speaking.  Patient denies headache, focal numbness or weakness, and does not perceive any difficulty with his speech at time of admission.  He does not remember having trouble speaking earlier either.  ED Course: Upon arrival to the ED, patient is found to be afebrile and saturating well on room air with normal HR and stable BP.  There is no acute finding on head CT and no large vessel occlusion on CTA of the head and neck.  Patient was evaluated by teleneurologist in the emergency department and it was recommended that he be admitted for stroke workup.    He passed a stroke swallow screen in the ED.  Review of Systems:  All other systems reviewed and apart from HPI, are negative.  Past Medical History:  Diagnosis Date   Atrial fibrillation (HCC)    Cholelithiasis 10/2011   Per CT. No cholecystitis radiographically.   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   Shortness of breath    exertion   Sleep apnea    cpap   Stroke [facility]) 2010/2013   Right Brain stroke    Past Surgical History:  Procedure Laterality Date   CARDIOVERSION  07/12/2011   Procedure: CARDIOVERSION;  Surgeon: Vinie KYM Maxcy, MD;   Location: [facility] ENDOSCOPY;  Service: Cardiovascular;  Laterality: N/A;   CAROTID DOPPLER STUDY  01/31/2012   no significant extracranial carotid artery stenosis   CATARACT EXTRACTION W/PHACO Left 01/30/2019   Procedure: CATARACT EXTRACTION PHACO AND INTRAOCULAR LENS PLACEMENT (IOC) (CDE: 4.76);  Surgeon: Harrie Agent, MD;  Location: [facility] ORS;  Service: Ophthalmology;  Laterality: Left;   COLONOSCOPY     DOPPLER ECHOCARDIOGRAPHY  01/31/2012   EF 55-60%, moderately calcified annulus of the mitral valve, left atrium demonstrated mild-moderately dilated   KNEE SURGERY     TEE WITHOUT CARDIOVERSION  07/12/2011   Procedure: TRANSESOPHAGEAL ECHOCARDIOGRAM (TEE);  Surgeon: Vinie KYM Maxcy, MD;  Location: [facility] ENDOSCOPY;  Service: Cardiovascular;  Laterality: N/A;  to be done at 1330   TONSILLECTOMY      Social History:   reports that he has never smoked. He has never used smokeless tobacco. He reports current alcohol  use of about 2.0 standard drinks of alcohol  per week. He reports that he does not use drugs.  No Known Allergies  Family History  Problem Relation Age of Onset   Diabetes Mother    Diabetes Son    Pancreatic cancer Other        GRANDFATHER   Colon cancer Other        INTESTINAL, GRANDMOTHER     Prior to Admission medications   Medication Sig Start Date  End Date Taking? Authorizing Provider  apixaban  (ELIQUIS ) 5 MG TABS tablet Take 1 tablet (5 mg total) by mouth 2 (two) times daily. 10/30/13   Hilty, Vinie BROCKS, MD  carvedilol  (COREG ) 25 MG tablet Take 25 mg by mouth 2 (two) times daily with a meal.    [provider]  Choline Fenofibrate  (FENOFIBRIC ACID ) 135 MG CPDR Take 135 mg by mouth daily.     [provider]  Continuous Glucose Sensor (FREESTYLE LIBRE 3 PLUS SENSOR) MISC Change sensor every 15 days. 08/31/23   Geroldine Berg, MD  diltiazem  (CARDIZEM  CD) 120 MG 24 hr capsule Take 1 capsule (120 mg total) by mouth daily. 12/27/22   Mallipeddi, Vishnu P, MD   escitalopram  (LEXAPRO ) 10 MG tablet Take 10 mg by mouth daily. Patient not taking: Reported on 12/27/2022    [provider]  insulin  lispro (HUMALOG) 100 UNIT/ML KwikPen SMARTSIG:12 Unit(s) SUB-Q 3 Times Daily 10/28/19   [provider]  LANTUS  SOLOSTAR 100 UNIT/ML Solostar Pen SMARTSIG:30 Unit(s) SUB-Q Daily 10/19/19   [provider]  losartan -hydrochlorothiazide  (HYZAAR) 100-25 MG tablet Take 1 tablet by mouth daily. 07/24/17   [provider]  mirabegron  ER (MYRBETRIQ ) 50 MG TB24 tablet Take 50 mg by mouth daily.    [provider]  AISHA SINKS test strip SMARTSIG:1 Strip(s) Via Meter 5 Times Daily 01/15/19   [provider]  Polyethyl Glycol-Propyl Glycol (SYSTANE) 0.4-0.3 % SOLN Place 1-2 drops into both eyes 3 (three) times daily as needed (dry/irritated eyes.). Patient not taking: Reported on 12/27/2022    [provider]  simvastatin (ZOCOR) 80 MG tablet Take 80 mg by mouth daily.    [provider]  tizanidine (ZANAFLEX) 2 MG capsule Take 4 mg by mouth every 8 (eight) hours as needed. Patient not taking: Reported on 12/27/2022 07/04/19   [provider]  tiZANidine (ZANAFLEX) 2 MG tablet Take 2 mg by mouth every 6 (six) hours as needed for muscle spasms. Patient not taking: Reported on 12/27/2022    [provider]    Physical Exam: Vitals:   11/29/23 0325 11/29/23 0328 11/29/23 0400 11/29/23 0430  BP:   (!) 150/87 (!) 150/76  Pulse: 87  84 76  Resp: 15  16 12   Temp:      SpO2: 98%  96% 96%  Weight:  86.9 kg      Constitutional: NAD, calm  Eyes: PERTLA, lids and conjunctivae normal ENMT: Mucous membranes are moist. Posterior pharynx clear of any exudate or lesions.   Neck: supple, no masses  Respiratory: no wheezing, no crackles. No accessory muscle use.  Cardiovascular: Rate ~80 and irregularly irregular. No extremity edema.   Abdomen: No tenderness, soft. Bowel sounds active.   Musculoskeletal: no clubbing / cyanosis. No joint deformity upper and lower extremities.   Skin: no significant rashes, lesions, ulcers. Warm, dry, well-perfused. Neurologic: Mild expressive aphasia, CN 2-12 grossly intact otherwise. Sensation to light touch intact. Strength 5/5 in all 4 limbs. Alert and oriented.  Psychiatric: Pleasant. Cooperative.    Labs and Imaging on Admission: I have personally reviewed following labs and imaging studies  CBC: Recent Labs  Lab 11/29/23 0307  WBC 5.6  NEUTROABS 3.5  HGB 14.1  HCT 42.4  MCV 94.0  PLT 156   Basic Metabolic Panel: Recent Labs  Lab 11/29/23 0307  NA 139  K 3.9  CL 104  CO2 25  GLUCOSE 135*  BUN 25*  CREATININE 1.19  CALCIUM  9.3  GFR: Estimated Creatinine Clearance: 51.3 mL/min (by C-G formula based on SCr of 1.19 mg/dL). Liver Function Tests: Recent Labs  Lab 11/29/23 0307  AST 26  ALT 15  ALKPHOS 46  BILITOT 1.3*  PROT 6.3*  ALBUMIN 3.7   No results for input(s): LIPASE, AMYLASE in the last 168 hours. No results for input(s): AMMONIA in the last 168 hours. Coagulation Profile: Recent Labs  Lab 11/29/23 0307  INR 1.3*   Cardiac Enzymes: No results for input(s): CKTOTAL, CKMB, CKMBINDEX, TROPONINI in the last 168 hours. BNP (last 3 results) No results for input(s): PROBNP in the last 8760 hours. HbA1C: No results for input(s): HGBA1C in the last 72 hours. CBG: No results for input(s): GLUCAP in the last 168 hours. Lipid Profile: No results for input(s): CHOL, HDL, LDLCALC, TRIG, CHOLHDL, LDLDIRECT in the last 72 hours. Thyroid  Function Tests: No results for input(s): TSH, T4TOTAL, FREET4, T3FREE, THYROIDAB in the last 72 hours. Anemia Panel: No results for input(s): VITAMINB12, FOLATE, FERRITIN, TIBC, IRON, RETICCTPCT in the last 72 hours. Urine analysis:    Component Value Date/Time   COLORURINE YELLOW 08/17/2017 2255   APPEARANCEUR CLEAR  08/17/2017 2255   LABSPEC 1.020 08/17/2017 2255   PHURINE 6.0 08/17/2017 2255   GLUCOSEU 50 (A) 08/17/2017 2255   HGBUR SMALL (A) 08/17/2017 2255   BILIRUBINUR NEGATIVE 08/17/2017 2255   KETONESUR NEGATIVE 08/17/2017 2255   PROTEINUR 30 (A) 08/17/2017 2255   UROBILINOGEN 0.2 03/13/2014 2240   NITRITE NEGATIVE 08/17/2017 2255   LEUKOCYTESUR NEGATIVE 08/17/2017 2255   Sepsis Labs: @LABRCNTIP (procalcitonin:4,lacticidven:4) )No results found for this or any previous visit (from the past 240 hours).   Radiological Exams on Admission: CT ANGIO HEAD NECK W WO CM W PERF (CODE STROKE) Result Date: 11/29/2023 CLINICAL DATA:  83 year old male neurologic deficit, found down fallen out of bed. Slurred speech and weakness. EXAM: CT ANGIOGRAPHY HEAD AND NECK CT PERFUSION BRAIN TECHNIQUE: Multidetector CT imaging of the head and neck was performed using the standard protocol during bolus administration of intravenous contrast. Multiplanar CT image reconstructions and MIPs were obtained to evaluate the vascular anatomy. Carotid stenosis measurements (when applicable) are obtained utilizing NASCET criteria, using the distal internal carotid diameter as the denominator. Multiphase CT imaging of the brain was performed following IV bolus contrast injection. Subsequent parametric perfusion maps were calculated using RAPID software. RADIATION DOSE REDUCTION: This exam was performed according to the departmental dose-optimization program which includes automated exposure control, adjustment of the mA and/or kV according to patient size and/or use of iterative reconstruction technique. CONTRAST:  OMNIPAQUE  IOHEXOL  350 MG/ML SOLN COMPARISON:  Plain head CT 0318 hours today.  Brain MRI 06/14/2022. FINDINGS: CT Brain Perfusion Findings: ASPECTS: 10 CBF (<30%) Volume: 0mL. There is a small area of less stringent CBF, but not CBV, abnormality in the anterior left MCA territory cortex. Perfusion (Tmax>6.0s) volume: 82mL,  but widespread in the bilateral cerebral white matter and likely artifactual. Questionable more pronounced oligemia anterior left MCA territory cortex. Mismatch Volume: Unclear due to suspected T-max artifact. Infarction Location:No infarct core detected. CTA NECK Skeleton: Pronounced Diffuse idiopathic skeletal hyperostosis (DISH). Associated widespread cervical and visible upper thoracic interbody ankylosis from bulky flowing endplate osteophytes. No acute osseous abnormality identified. Upper chest: Negative. Other neck: Nonvascular neck soft tissue spaces are within normal limits. Aortic arch: Calcified aortic atherosclerosis. Tortuous arch. Three vessel arch. Right carotid system: Brachiocephalic artery and right CCA origin plaque without stenosis. Tortuous right CCA with widespread but mild calcified plaque.  No stenosis to the bifurcation. Moderate calcified plaque at the bifurcation. Tortuous right ICA distal to the bulb. No stenosis to the skull base. Left carotid system: Similar tortuosity and widespread mild calcified plaque. Moderate calcified plaque at the bifurcation, left ICA origin and bulb. No stenosis results. Vertebral arteries: Proximal right subclavian artery atherosclerosis without significant stenosis. Heavily calcified right vertebral artery origin and proximal V1 segment with severe stenosis on series 7, image 179. The vessel remains patent. No additional plaque or stenosis to the skull base. Proximal left subclavian artery soft and calcified atherosclerosis without stenosis. Calcified plaque at the left vertebral artery origin resulting in moderate to severe stenosis series 7, image 177. Codominant left vertebral artery remains patent with mild additional V3 segment calcified plaque, no stenosis to the skull base. CTA HEAD Posterior circulation: Tortuous distal vertebral arteries and vertebrobasilar junction with abundant calcified plaque, especially in the left V4 segment. Mild-to-moderate  left V4 stenosis. Patent PICA origins. No significant right V4 stenosis. Mild vertebrobasilar junction stenosis. Tortuous and heavily calcified basilar artery (series 6, image 124) with no significant basilar stenosis. Patent SCA and PCA origins with calcified plaque. No high-grade origin stenosis. Posterior communicating arteries are diminutive or absent. Tandem severe stenoses of the left PCA P1 and P2 segments (series 11, image 23). Similar severe stenosis at the right PCA P1/P2 junction. Anterior circulation: Both ICA siphons are patent and tortuous. Mild siphon plaque in the petrous segment. Widespread and moderate calcified plaque beginning in the cavernous segment. Moderate distal supraclinoid left ICA stenosis (series 8, image 112). Similar right siphon plaque and moderate distal right ICA supraclinoid stenosis (series 6, image 109). Patent carotid termini, MCA and ACA origins. No high-grade origin stenosis. Diminutive or absent anterior communicating artery. Bilateral ACA branches are patent with mild irregularity. Left MCA M1 segment and bifurcation are patent. Right MCA mild proximal right M1 stenosis (series 2, image 20). Right M1 and right MCA bifurcation remain patent. Mild to moderate bilateral MCA M2 and distal branch irregularity. No complete branch occlusion is identified. Venous sinuses: Early contrast timing, not well evaluated. Anatomic variants: None. Review of the MIP images confirms the above findings IMPRESSION: 1. CTA is negative for large vessel occlusion, but Positive for Extensive Atherosclerosis throughout the head and neck. 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 - Moderate stenosis both distal supraclinoid ICAs. - Moderate to Severe stenosis bilateral PCA P1 and P2 segments. 2. CT Perfusion affected by artifact. Questionable oligemia in the anterior Left MCA division. 3.  Aortic Atherosclerosis (ICD10-I70.0). 4. 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 Electronically Signed   By: VEAR Hurst M.D.   On: 11/29/2023 04:11   CT HEAD CODE STROKE WO CONTRAST` Result Date: 11/29/2023 CLINICAL DATA:  Code stroke. Initial evaluation for acute neuro deficit, slurred speech, altered mental status. EXAM: CT HEAD WITHOUT CONTRAST TECHNIQUE: Contiguous axial images were obtained from the base of the skull through the vertex without intravenous contrast. RADIATION DOSE REDUCTION: This exam was performed according to the departmental dose-optimization program which includes automated exposure control, adjustment of the mA and/or kV according to patient size and/or use of iterative reconstruction technique. COMPARISON:  Prior study from 06/14/2022 FINDINGS: Brain: Generalized age-related cerebral atrophy. Patchy and confluent hypodensity involving the supratentorial cerebral white matter, consistent with chronic small vessel ischemic disease, advanced in nature. Multiple scattered remote infarcts involving the left frontal lobe, left occipital lobe, and right basal ganglia/thalamus.  No acute intracranial hemorrhage. No acute large vessel territory infarct. No mass lesion or midline shift. Mild ventricular prominence related global parenchymal volume loss of hydrocephalus. No convincing extra-axial fluid collection. Vascular: No abnormal hyperdense vessel. Extensive calcified atherosclerosis present at the skull base. Skull: Scalp soft tissues demonstrate no acute finding. Appear intact. Sinuses/Orbits: Globes orbital soft tissues within normal limits. Mild mucosal thickening present about the maxillary sinuses. Paranasal sinuses are otherwise clear. Trace right mastoid effusion noted, of doubtful significance. Other: None. ASPECTS [facility] Stroke Program Early CT Score) - Ganglionic level infarction (caudate, lentiform nuclei, internal capsule, insula, M1-M3 cortex): 7 - Supraganglionic infarction  (M4-M6 cortex): 3 Total score (0-10 with 10 being normal): 10 IMPRESSION: 1. No acute intracranial abnormality. 2. Aspects is 10. 3. 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 Results were called by telephone at the time of interpretation on 11/29/2023 at 3:33 am to provider VICENTA ABLE , who verbally acknowledged these results. Electronically Signed   By: Morene Hoard M.D.   On: 11/29/2023 03:33    EKG: Independently reviewed. Atrial fibrillation.   Assessment/Plan   1. Aphasia  - No acute findings on head; no emergent large vessel occlusion on CTA head & neck   - Continue cardiac monitoring and frequent neuro checks, check MRI brain, echocardiogram, A1c, and lipids, consult PT/OT/SLP, hold Eliquis  pending MRI, start ASA 81 mg, continue statin    2. Type II DM  - Check CBGs, use basal insulin  and short-acting correctional insulin     3. Atrial fibrillation  - Hold Eliquis  pending MRI brain   4. Hypertension  - Permit HTN for now     DVT prophylaxis: Lovenox   Code Status: Full  Level of Care: Level of care: Telemetry Family Communication: None present  Disposition Plan:  Patient is from: ILF  Anticipated d/c is to: TBD Anticipated d/c date is: 11/30/23  Patient currently: Pending CVA workup  Consults called: Teleneurology  Admission status: Observation     Evalene GORMAN Sprinkles, MD [facility]  11/29/2023, 5:12 AM

## 2023-11-29 NOTE — Plan of Care (Signed)
  Problem: Acute Rehab PT Goals(only PT should resolve) Goal: Pt Will Go Supine/Side To Sit Outcome: Progressing Flowsheets (Taken 11/29/2023 1240) Pt will go Supine/Side to Sit: with supervision Goal: Patient Will Transfer Sit To/From Stand Outcome: Progressing Flowsheets (Taken 11/29/2023 1240) Patient will transfer sit to/from stand: with supervision Goal: Pt Will Transfer Bed To Chair/Chair To Bed Outcome: Progressing Flowsheets (Taken 11/29/2023 1240) Pt will Transfer Bed to Chair/Chair to Bed: with supervision Goal: Pt Will Ambulate Outcome: Progressing Flowsheets (Taken 11/29/2023 1240) Pt will Ambulate:  50 feet  with rolling walker  with supervision    12:40 PM, 11/29/23 Rosaria Settler, PT, DPT [place]

## 2023-11-29 NOTE — ED Provider Notes (Signed)
 [place] EMERGENCY DEPARTMENT AT [facility] Provider Note   CSN: [id number] Arrival date & time: 11/29/23  9694     Patient presents with: No chief complaint on file.   [name] is a 83 y.o. male.   Patient is an 83 year old male with past medical history of type 2 diabetes, persistent atrial fibrillation, hypertension.  Patient brought by EMS for evaluation of altered mental status.  Patient last seen normal at 10 PM when going to bed.  His wife found him on the floor just prior to arrival.  He seemed confused and not making sense.  Patient denies to me he is experiencing any discomfort or symptoms.  Symptoms do seem to be improving upon arrival compared to what was described by EMS on scene.       Prior to Admission medications   Medication Sig Start Date End Date Taking? Authorizing Provider  apixaban  (ELIQUIS ) 5 MG TABS tablet Take 1 tablet (5 mg total) by mouth 2 (two) times daily. 10/30/13   Hilty, Vinie BROCKS, MD  carvedilol  (COREG ) 25 MG tablet Take 25 mg by mouth 2 (two) times daily with a meal.    [provider]  Choline Fenofibrate  (FENOFIBRIC ACID ) 135 MG CPDR Take 135 mg by mouth daily.     [provider]  Continuous Glucose Sensor (FREESTYLE LIBRE 3 PLUS SENSOR) MISC Change sensor every 15 days. 08/31/23   Geroldine Berg, MD  diltiazem  (CARDIZEM  CD) 120 MG 24 hr capsule Take 1 capsule (120 mg total) by mouth daily. 12/27/22   Mallipeddi, Vishnu P, MD  escitalopram  (LEXAPRO ) 10 MG tablet Take 10 mg by mouth daily. Patient not taking: Reported on 12/27/2022    [provider]  insulin  lispro (HUMALOG) 100 UNIT/ML KwikPen SMARTSIG:12 Unit(s) SUB-Q 3 Times Daily 10/28/19   [provider]  LANTUS  SOLOSTAR 100 UNIT/ML Solostar Pen SMARTSIG:30 Unit(s) SUB-Q Daily 10/19/19   [provider]  losartan -hydrochlorothiazide  (HYZAAR) 100-25 MG tablet Take 1 tablet by mouth daily. 07/24/17   [provider]  mirabegron  ER  (MYRBETRIQ ) 50 MG TB24 tablet Take 50 mg by mouth daily.    [provider]  AISHA SINKS test strip SMARTSIG:1 Strip(s) Via Meter 5 Times Daily 01/15/19   [provider]  Polyethyl Glycol-Propyl Glycol (SYSTANE) 0.4-0.3 % SOLN Place 1-2 drops into both eyes 3 (three) times daily as needed (dry/irritated eyes.). Patient not taking: Reported on 12/27/2022    [provider]  simvastatin (ZOCOR) 80 MG tablet Take 80 mg by mouth daily.    [provider]  tizanidine (ZANAFLEX) 2 MG capsule Take 4 mg by mouth every 8 (eight) hours as needed. Patient not taking: Reported on 12/27/2022 07/04/19   [provider]  tiZANidine (ZANAFLEX) 2 MG tablet Take 2 mg by mouth every 6 (six) hours as needed for muscle spasms. Patient not taking: Reported on 12/27/2022    [provider]    Allergies: Patient has no known allergies.    Review of Systems  All other systems reviewed and are negative.   Updated Vital Signs There were no vitals taken for this visit.  Physical Exam Vitals and nursing note reviewed.  Constitutional:      General: He is not in acute distress.    Appearance: He is well-developed. He is not diaphoretic.  HENT:     Head: Normocephalic and atraumatic.  Cardiovascular:     Rate and Rhythm: Normal rate and regular rhythm.  Heart sounds: No murmur heard.    No friction rub.  Pulmonary:     Effort: Pulmonary effort is normal. No respiratory distress.     Breath sounds: Normal breath sounds. No wheezing or rales.  Abdominal:     General: Bowel sounds are normal. There is no distension.     Palpations: Abdomen is soft.     Tenderness: There is no abdominal tenderness.  Musculoskeletal:        General: Normal range of motion.     Cervical back: Normal range of motion and neck supple.  Skin:    General: Skin is warm and dry.  Neurological:     Mental Status: He is alert and oriented to person, place, and time.      Coordination: Coordination normal.     Comments: Patient is awake and alert.  He is able to move all extremities and strength is 5 out of 5 throughout.  He follows commands appropriately, but is somewhat slow to do so.  Speech does seem somewhat slurred.     (all labs ordered are listed, but only abnormal results are displayed) Labs Reviewed - No data to display  EKG: EKG Interpretation Date/Time:  Friday November 29 2023 03:23:45 EDT Ventricular Rate:  83 PR Interval:    QRS Duration:  108 QT Interval:  464 QTC Calculation: 464 R Axis:   30  Text Interpretation: Atrial fibrillation Borderline repolarization abnormality No significant change since 06/14/2022 Confirmed by Geroldine Berg ([id number]) on 11/29/2023 4:00:20 AM  Radiology: No results found.   Procedures   Medications Ordered in the ED - No data to display                                  Medical Decision Making Amount and/or Complexity of Data Reviewed Labs: ordered. Radiology: ordered.  Risk Decision regarding hospitalization.   Patient is an 83 year old male presenting for altered mental status.  Patient apparently was found on the floor by his wife not making sense and altered.  Patient brought by EMS as a code stroke with last known well time at 10 PM.  Patient arrives here with stable vital signs.  Physical examination reveals cranial nerves to be intact, but is displaying slurred speech and some aphasia.  He is slow to follow commands, but does respond appropriately.  Laboratory studies obtained including CBC, CMP, and coag studies.  All studies basically unremarkable.  Serum EtOH is negative.  CT of the head obtained showing no acute intracranial process.  CTA of the head and neck is negative for LVO.  Code stroke with continued upon arrival to the ER and teleneurology was involved in the patient's care.  They do not feel as though he is a candidate for intervention given the improving symptoms and the fact that  he is on Eliquis .  They are recommending admission for MRI and neurochecks.  CRITICAL CARE Performed by: Berg Geroldine Total critical care time: 35 minutes Critical care time was exclusive of separately billable procedures and treating other patients. Critical care was necessary to treat or prevent imminent or life-threatening deterioration. Critical care was time spent personally by me on the following activities: development of treatment plan with patient and/or surrogate as well as nursing, discussions with consultants, evaluation of patient's response to treatment, examination of patient, obtaining history from patient or surrogate, ordering and performing treatments and interventions, ordering and review of laboratory studies, ordering  and review of radiographic studies, pulse oximetry and re-evaluation of patient's condition.      Final diagnoses:  None    ED Discharge Orders     None          Geroldine Berg, MD 11/29/23 [phone]

## 2023-11-29 NOTE — Evaluation (Signed)
 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   Essential hypertension 11/18/2017   SIRS (systemic inflammatory response syndrome) (HCC) 08/17/2017   Aortic stenosis 04/27/2014   PAF (paroxysmal atrial fibrillation) (HCC) 09/15/2013   Current use of long term anticoagulation 09/15/2013   Febrile illness, acute 10/26/2011   Cough 10/26/2011   Persistent atrial fibrillation (HCC) 05/15/2011   DM type 2 (diabetes mellitus, type 2) (HCC) 05/15/2011   Hyperlipemia 05/15/2011   Obesity 05/15/2011   Benign hypertension 05/15/2011   H/O: stroke 05/15/2011   Past Medical History:  Past Medical History:  Diagnosis Date   Atrial fibrillation (HCC)    Cholelithiasis 10/2011   Per CT. No cholecystitis radiographically.   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   Shortness of breath    exertion   Sleep apnea    cpap   Stroke [facility]) 2010/2013   Right Brain stroke   Past Surgical History:  Past Surgical History:  Procedure Laterality Date   CARDIOVERSION  07/12/2011   Procedure: CARDIOVERSION;  Surgeon: Vinie KYM Maxcy, MD;  Location: [facility] ENDOSCOPY;  Service: Cardiovascular;  Laterality: N/A;   CAROTID DOPPLER STUDY  01/31/2012   no significant extracranial carotid artery stenosis   CATARACT EXTRACTION W/PHACO Left 01/30/2019   Procedure: CATARACT EXTRACTION PHACO AND INTRAOCULAR LENS PLACEMENT (IOC) (CDE: 4.76);   Surgeon: Harrie Agent, MD;  Location: [facility] ORS;  Service: Ophthalmology;  Laterality: Left;   COLONOSCOPY     DOPPLER ECHOCARDIOGRAPHY  01/31/2012   EF 55-60%, moderately calcified annulus of the mitral valve, left atrium demonstrated mild-moderately dilated   KNEE SURGERY     TEE WITHOUT CARDIOVERSION  07/12/2011   Procedure: TRANSESOPHAGEAL ECHOCARDIOGRAM (TEE);  Surgeon: Vinie KYM Maxcy, MD;  Location: [facility] ENDOSCOPY;  Service: Cardiovascular;  Laterality: N/A;  to be done at 1330   TONSILLECTOMY     HPI:  [name] is an 83 y.o. male with medical history significant for hypertension, type 2 diabetes mellitus, atrial fibrillation on Eliquis , and history of CVA who presented on 8/29 with speech difficulty. Patient was reportedly in his usual state when he went to bed last night at 10 PM but apparently fell out of bed early this morning and was noted to have difficulty speaking.  Patient denies headache, focal numbness or weakness, and does not perceive any difficulty with his speech at time of admission.  He does not remember having trouble speaking earlier either. CT head indicated generalized age-related cerebral atrophy with multiple scattered remote infarcts within L frontal lobe, L occipital lobe and R basal ganglia/thalamus. ST consulted for speech/language evaluation.   Assessment / Plan / Recommendation Clinical Impression  Pt seen for speech/language cognitive assessment with portions of [employer] Mental Status Examination, informal observations, family report, chart review and functional tasks.  Pt did not achieve an overall score on SLUMS d/t limited subtests attempted.  Pt has aphasia at baseline post  prior CVA, but is able to communicate in simple sentences and was oriented to self, place and time (month, not year), but not situation.  Pt exhibits baseline memory deficits per family report and he and his spouse have moved into an ALF recently.  Pt denotes requiring walker for  A, but being able to perform other ADLs independently prior to recent fall.  Pt able to follow simple commands, but processing delayed cumulatively.  Simple problem solving impacted in functional situations without min A (ie: call bell, urinal) during evaluation.  Speech was slow, but intelligible and overall processing of information/execution decreased.  May consider ST f/u at next venue of care if pt does not fully return to baseline and/or dysphagia needs arise (nursing stated concern with pill administration in am).  Otherwise, ST will s/o at this time in acute setting as pt appears to be functioning at baseline level.  Thank you for this consult.    SLP Assessment  SLP Recommendation/Assessment: All further Speech Language Pathology needs can be addressed in the next venue of care (prn) SLP Visit Diagnosis: Cognitive communication deficit (R41.841);Aphasia (R47.01)     Assistance Recommended at Discharge  Other (comment) (TBD)  Functional Status Assessment Patient has had a recent decline in their functional status and demonstrates the ability to make significant improvements in function in a reasonable and predictable amount of time.  Frequency and Duration Other (Comment) (evaluation only)         SLP Evaluation Cognition  Overall Cognitive Status: History of cognitive impairments - at baseline Arousal/Alertness: Awake/alert Orientation Level: Oriented to person;Oriented to place;Disoriented to situation;Oriented to time (oriented to month, not year) Month: August Attention: Sustained Sustained Attention: Appears intact Memory: Impaired Memory Impairment: Retrieval deficit;Decreased short term memory Decreased Short Term Memory: Verbal basic;Functional basic Awareness: Appears intact Problem Solving: Impaired Problem Solving Impairment: Other (comment) (d/t STM deficits) Safety/Judgment: Appears intact       Comprehension  Auditory Comprehension Overall Auditory  Comprehension: Appears within functional limits for tasks assessed Commands: Not tested Conversation: Simple Other Conversation Comments: aphasia impacts conversation Interfering Components: Working [employer] fast streamer;Other (comment);Processing speed (aphasia at baseline post CVA) EffectiveTechniques: Repetition;Extra processing time Visual Recognition/Discrimination Discrimination: Not tested Reading Comprehension Reading Status: Not tested    Expression Expression Primary Mode of Expression: Verbal Verbal Expression Overall Verbal Expression: Impaired at baseline Level of Generative/Spontaneous Verbalization: Conversation Naming: Impairment Other Naming Comments: baseline aphasia Interfering Components: Premorbid deficit Non-Verbal Means of Communication: Not applicable Written Expression Written Expression: Not tested   Oral / Motor  Oral Motor/Sensory Function Overall Oral Motor/Sensory Function: Within functional limits Motor Speech Overall Motor Speech: Appears within functional limits for tasks assessed Respiration: Within functional limits Phonation: Normal Resonance: Within functional limits Articulation: Within functional limitis Intelligibility: Intelligible Motor Planning: Not tested Motor Speech Errors: Not applicable Interfering Components: Premorbid status            Pat Tierra Thoma,M.S.,CCC-SLP 11/29/2023, 1:55 PM

## 2023-11-29 NOTE — ED Notes (Addendum)
 CODE STROKE PAGED @ 0300 UPON EMS ENCODE IN THE FIELD

## 2023-11-29 NOTE — Plan of Care (Signed)
  Problem: Acute Rehab OT Goals (only OT should resolve) Goal: Pt. Will Perform Grooming Flowsheets (Taken 11/29/2023 1111) Pt Will Perform Grooming:  with modified independence  standing Goal: Pt. Will Transfer To Toilet Flowsheets (Taken 11/29/2023 1111) Pt Will Transfer to Toilet:  with modified independence  ambulating Goal: Pt/Caregiver Will Perform Home Exercise Program Flowsheets (Taken 11/29/2023 1111) Pt/caregiver will Perform Home Exercise Program:  Independently  -increased coordination  Increased strength  Both right and left upper extremity Goal: OT Additional ADL Goal #1 Flowsheets (Taken 11/29/2023 1111) Additional ADL Goal #1: Pt will demonstrate [facility] viisual perception by identifying objects in both fields without assist 4/5 attempts.  Siara Gorder OT, MOT

## 2023-11-29 NOTE — Plan of Care (Signed)
  Problem: Education: Goal: Ability to describe self-care measures that may prevent or decrease complications (Diabetes Survival Skills Education) will improve Outcome: Progressing Goal: Individualized Educational Video(s) Outcome: Progressing   Problem: Coping: Goal: Ability to adjust to condition or change in health will improve Outcome: Progressing   Problem: Health Behavior/Discharge Planning: Goal: Ability to identify and utilize available resources and services will improve Outcome: Progressing Goal: Ability to manage health-related needs will improve Outcome: Progressing   Problem: Metabolic: Goal: Ability to maintain appropriate glucose levels will improve Outcome: Progressing   Problem: Nutritional: Goal: Maintenance of adequate nutrition will improve Outcome: Progressing Goal: Progress toward achieving an optimal weight will improve Outcome: Progressing   Problem: Skin Integrity: Goal: Risk for impaired skin integrity will decrease Outcome: Progressing   Problem: Education: Goal: Knowledge of disease or condition will improve Outcome: Progressing Goal: Knowledge of secondary prevention will improve (MUST DOCUMENT ALL) Outcome: Progressing Goal: Knowledge of patient specific risk factors will improve (DELETE if not current risk factor) Outcome: Progressing   Problem: Ischemic Stroke/TIA Tissue Perfusion: Goal: Complications of ischemic stroke/TIA will be minimized Outcome: Progressing   Problem: Coping: Goal: Will verbalize positive feelings about self Outcome: Progressing   Problem: Health Behavior/Discharge Planning: Goal: Ability to manage health-related needs will improve Outcome: Progressing Goal: Goals will be collaboratively established with patient/family Outcome: Progressing

## 2023-11-29 NOTE — Progress Notes (Signed)
*  PRELIMINARY RESULTS* Echocardiogram 2D Echocardiogram has been performed with a Saline Bubble Study.  [name] 11/29/2023, 2:57 PM

## 2023-11-29 NOTE — TOC Progression Note (Addendum)
 Transition of Care [facility]) - Progression Note    Patient Details  Name: [name] MRN: [id number] Date of Birth: 05-26-1940  Transition of [facility]) CM/SW Contact  Mcarthur Saddie Kim, [place] Phone Number: 11/29/2023, 12:45 PM  Clinical Narrative:   Bed offer at [facility] accepted. D[facility]. Auth pendingGLENWOOD Mayme Shara REYNE. [facility] can accept pt over weekend if auth received.     Expected Discharge Plan: Skilled Nursing Facility Barriers to Discharge: Continued Medical Work up               Expected Discharge Plan and Services In-house Referral: Clinical Social Work   Post Acute Care Choice: Skilled Nursing Facility Living arrangements for the past 2 months: Independent Living Facility Expected Discharge Date: 11/29/23                                     Social Drivers of Health (SDOH) Interventions SDOH Screenings   Food Insecurity: No Food Insecurity (11/29/2023)  Housing: Low Risk  (11/29/2023)  Transportation Needs: No Transportation Needs (11/29/2023)  Utilities: Not At Risk (11/29/2023)  Social Connections: Socially Integrated (11/29/2023)  Tobacco Use: Low Risk  (11/29/2023)    Readmission Risk Interventions     No data to display

## 2023-11-29 NOTE — Discharge Instructions (Signed)
 IMPORTANT INFORMATION: PAY CLOSE ATTENTION   PHYSICIAN DISCHARGE INSTRUCTIONS  Follow with Primary care provider  Nichole Senior, MD  and other consultants as instructed by your Hospitalist Physician  SEEK MEDICAL CARE OR RETURN TO EMERGENCY ROOM IF SYMPTOMS COME BACK, WORSEN OR NEW PROBLEM DEVELOPS   Please note: You were cared for by a hospitalist during your hospital stay. Every effort will be made to forward records to your primary care provider.  You can request that your primary care provider send for your hospital records if they have not received them.  Once you are discharged, your primary care physician will handle any further medical issues. Please note that NO REFILLS for any discharge medications will be authorized once you are discharged, as it is imperative that you return to your primary care physician (or establish a relationship with a primary care physician if you do not have one) for your post hospital discharge needs so that they can reassess your need for medications and monitor your lab values.  Please get a complete blood count and chemistry panel checked by your Primary MD at your next visit, and again as instructed by your Primary MD.  Get Medicines reviewed and adjusted: Please take all your medications with you for your next visit with your Primary MD  Laboratory/radiological data: Please request your Primary MD to go over all hospital tests and procedure/radiological results at the follow up, please ask your primary care provider to get all Hospital records sent to his/her office.  In some cases, they will be blood work, cultures and biopsy results pending at the time of your discharge. Please request that your primary care provider follow up on these results.  If you are diabetic, please bring your blood sugar readings with you to your follow up appointment with primary care.    Please call and make your follow up appointments as soon as possible.    Also Note  the following: If you experience worsening of your admission symptoms, develop shortness of breath, life threatening emergency, suicidal or homicidal thoughts you must seek medical attention immediately by calling 911 or calling your MD immediately  if symptoms less severe.  You must read complete instructions/literature along with all the possible adverse reactions/side effects for all the Medicines you take and that have been prescribed to you. Take any new Medicines after you have completely understood and accpet all the possible adverse reactions/side effects.   Do not drive when taking Pain medications or sleeping medications (Benzodiazepines)  Do not take more than prescribed Pain, Sleep and Anxiety Medications. It is not advisable to combine anxiety,sleep and pain medications without talking with your primary care practitioner  Special Instructions: If you have smoked or chewed Tobacco  in the last 2 yrs please stop smoking, stop any regular Alcohol   and or any Recreational drug use.  Wear Seat belts while driving.  Do not drive if taking any narcotic, mind altering or controlled substances or recreational drugs or alcohol .

## 2023-11-29 NOTE — Progress Notes (Signed)
 Patient eating lunch.     Celesta Gentile, RCS

## 2023-11-29 NOTE — TOC Initial Note (Signed)
 Transition of Care [facility]) - Initial/Assessment Note    Patient Details  Name: [name] MRN: [id number] Date of Birth: [birth year]  Transition of Care [facility]) CM/SW Contact:    Mcarthur Saddie Kim, LCSW Phone Number: 11/29/2023, 12:17 PM  Clinical Narrative: Pt admitted due to aphasia. Assessment completed with pt and pt's daughter, Clarita. Pt and wife recently moved into [place]. PT/OT evaluated pt and recommend SNF. LCSW discussed placement process, including authorization and Medicare.gov ratings. They request [facility] if possible. Referral sent. Will ask CMA to start auth.                   Expected Discharge Plan: Skilled Nursing Facility Barriers to Discharge: Continued Medical Work up   Patient Goals and CMS Choice Patient states their goals for this hospitalization and ongoing recovery are:: short term rehab   Choice offered to / list presented to : Patient, Adult Children [place] ownership interest in [facility].provided to:: Patient    Expected Discharge Plan and Services In-house Referral: Clinical Social Work   Post Acute Care Choice: Skilled Nursing Facility Living arrangements for the past 2 months: Independent Living Facility Expected Discharge Date: 11/29/23                                    Prior Living Arrangements/Services Living arrangements for the past 2 months: Independent Living Facility Lives with:: Facility Resident, Spouse Patient language and need for interpreter reviewed:: Yes Do you feel safe going back to the place where you live?: Yes          Current home services: DME (walker) Criminal Activity/Legal Involvement Pertinent to Current Situation/Hospitalization: No - Comment as needed  Activities of Daily Living      Permission Sought/Granted                  Emotional Assessment     Affect (typically observed): Appropriate Orientation: : Oriented to Self, Oriented to Place, Oriented to   Time Alcohol  / Substance Use: Not Applicable Psych Involvement: No (comment)  Admission diagnosis:  Aphasia [R47.01] Altered mental status, unspecified altered mental status type [R41.82] Patient Active Problem List   Diagnosis Date Noted   Aphasia 11/29/2023   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   Essential hypertension 11/18/2017   SIRS (systemic inflammatory response syndrome) (HCC) 08/17/2017   Aortic stenosis 04/27/2014   PAF (paroxysmal atrial fibrillation) (HCC) 09/15/2013   Current use of long term anticoagulation 09/15/2013   Febrile illness, acute 10/26/2011   Cough 10/26/2011   Persistent atrial fibrillation (HCC) 05/15/2011   DM type 2 (diabetes mellitus, type 2) (HCC) 05/15/2011   Hyperlipemia 05/15/2011   Obesity 05/15/2011   Benign hypertension 05/15/2011   H/O: stroke 05/15/2011   PCP:  Nichole Senior, MD Pharmacy:   [facility] [address], [place] - [address] [place] [address] Phone: [phone] Fax: [phone]     Social Drivers of Health (SDOH) Social History: SDOH Screenings   Food Insecurity: No Food Insecurity (11/29/2023)  Housing: Low Risk  (11/29/2023)  Transportation Needs: No Transportation Needs (11/29/2023)  Utilities: Not At Risk (11/29/2023)  Social Connections: Socially Integrated (11/29/2023)  Tobacco Use: Low Risk  (11/29/2023)   SDOH Interventions:     Readmission Risk Interventions     No data to display

## 2023-11-29 NOTE — Progress Notes (Signed)
 PROGRESS NOTE   [name]  FMW:[id number] DOB: May 19, 1940 DOA: 11/29/2023 PCP: Nichole Senior, [name]   Chief Complaint  Patient presents with   Code Stroke   Level of care: Telemetry  Brief Admission History:  83 y.o. male with medical history significant for hypertension, type 2 diabetes mellitus, atrial fibrillation on Eliquis , and history of CVA who presents with speech difficulty.   Patient was reportedly in his usual state when he went to bed last night at 10 PM but apparently fell out of bed early this morning and was noted to have difficulty speaking.  Patient denies headache, focal numbness or weakness, and does not perceive any difficulty with his speech at time of admission.  He does not remember having trouble speaking earlier either.   ED Course: Upon arrival to the ED, patient is found to be afebrile and saturating well on room air with normal HR and stable BP.  There is no acute finding on head CT and no large vessel occlusion on CTA of the head and neck.   Patient was evaluated by teleneurologist in the emergency department and it was recommended that he be admitted for stroke workup.     He passed a stroke swallow screen in the ED.   Assessment and Plan:  Question of TIA with Aphasia - RESOLVED  - No acute findings on CT or MRI brain;  no emergent large vessel occlusion on CTA head & neck   - stable cardiac monitoring and neuro checks, he was started on aspirin  81 mg by neurologist recommendation   Controlled Type 2 DM  - A1c pending - Check CBGs, use basal insulin  and short-acting correctional insulin     CBG (last 3)  Recent Labs    11/29/23 0556 11/29/23 0714 11/29/23 1125  GLUCAP 129* 134* 150*    Atrial fibrillation  - resumed Eliquis  with negative CT and MRI brain, no hemorrhage seen    Hypertension  - with negative CT and MRI resumed home BP lowering medications   Gait instability - PT recommending SNF rehab placement - consult to [facility]    DVT  prophylaxis: apixaban   Code Status: Full  Family Communication:  Disposition:awaiting SNF   Consultants:  PT/OT Procedures:   Antimicrobials:    Subjective: Pt without any specific complaints, says his speech is back to normal and not sure what happened yesterday.   Objective: Vitals:   11/29/23 0400 11/29/23 0430 11/29/23 0548 11/29/23 0828  BP: (!) 150/87 (!) 150/76 (!) 155/88 (!) 142/66  Pulse: 84 76 70 73  Resp: 16 12 (!) 21 18  Temp:   97.8 F (36.6 C) (!) 97.5 F (36.4 C)  TempSrc:   Oral Oral  SpO2: 96% 96% 97% 98%  Weight:   82.5 kg     Intake/Output Summary (Last 24 hours) at 11/29/2023 1222 Last data filed at 11/29/2023 0600 Gross per 24 hour  Intake 80.57 ml  Output --  Net 80.57 ml   Filed Weights   11/29/23 0328 11/29/23 0548  Weight: 86.9 kg 82.5 kg   Examination:  General exam: Appears calm and comfortable  Respiratory system: Clear to auscultation. Respiratory effort normal. Cardiovascular system: normal S1 & S2 heard. No JVD, murmurs, rubs, gallops or clicks. No pedal edema. Gastrointestinal system: Abdomen is nondistended, soft and nontender. No organomegaly or masses felt. Normal bowel sounds heard. Central nervous system: Alert and oriented. No focal neurological deficits. Extremities: Symmetric 5 x 5 power. Skin: No rashes, lesions or ulcers. Psychiatry:  Judgement and insight appear normal. Mood & affect appropriate.   Data Reviewed: I have personally reviewed following labs and imaging studies  CBC: Recent Labs  Lab 11/29/23 0307  WBC 5.6  NEUTROABS 3.5  HGB 14.1  HCT 42.4  MCV 94.0  PLT 156    Basic Metabolic Panel: Recent Labs  Lab 11/29/23 0307  NA 139  K 3.9  CL 104  CO2 25  GLUCOSE 135*  BUN 25*  CREATININE 1.19  CALCIUM  9.3    CBG: Recent Labs  Lab 11/29/23 0305 11/29/23 0556 11/29/23 0714 11/29/23 1125  GLUCAP 114* 129* 134* 150*    No results found for this or any previous visit (from the past 240  hours).   Radiology Studies: MR BRAIN WO CONTRAST Result Date: 11/29/2023 EXAM: MR Brain without Intravenous Contrast. CLINICAL HISTORY: 83 year old male with type 2 diabetes, persistent atrial fibrillation, and hypertension, presenting with acute neuro deficit and suspected stroke. Last seen normal at 10 PM, found on the floor confused. Symptoms improving upon arrival. TECHNIQUE: Magnetic resonance images of the brain without intravenous contrast in multiple planes. CONTRAST: Without; COMPARISON: MRI brain 06/14/2022, CTA head and neck 11/29/2023 FINDINGS: BRAIN: No evidence of acute infarct within the limits of patient motion artifact. 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 Background of severe chronic small vessel disease. Tortuosity of the vertebral basilar system with diminished flow voids on T2-weighted imaging likely due to in-plane flow. No intracranial mass or hemorrhage. No midline shift or extra-axial fluid collection. No cerebellar tonsillar ectopia. VENTRICLES: No hydrocephalus. ORBITS: The orbits are normal. SINUSES AND MASTOIDS: The sinuses and mastoid air cells are clear. BONES: No acute fracture or focal osseous lesion. IMPRESSION: 1. No evidence of acute infarct within limits of motion artifact. 2. 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 3. Severe chronic small vessel disease. Electronically signed by: Ryan Chess [name] 11/29/2023 11:28 AM EDT RP Workstation: [id number]   CT ANGIO HEAD NECK W WO CM W PERF (CODE STROKE) Result Date: 11/29/2023 CLINICAL DATA:  83 year old male neurologic deficit, found down fallen out of bed. Slurred speech and weakness. EXAM: CT ANGIOGRAPHY HEAD AND NECK CT PERFUSION BRAIN TECHNIQUE: Multidetector CT imaging of the head and neck was performed using the standard protocol during bolus administration of intravenous contrast.  Multiplanar CT image reconstructions and MIPs were obtained to evaluate the vascular anatomy. Carotid stenosis measurements (when applicable) are obtained utilizing NASCET criteria, using the distal internal carotid diameter as the denominator. Multiphase CT imaging of the brain was performed following IV bolus contrast injection. Subsequent parametric perfusion maps were calculated using RAPID software. RADIATION DOSE REDUCTION: This exam was performed according to the departmental dose-optimization program which includes automated exposure control, adjustment of the mA and/or kV according to patient size and/or use of iterative reconstruction technique. CONTRAST:  OMNIPAQUE  IOHEXOL  350 MG/ML SOLN COMPARISON:  Plain head CT 0318 hours today.  Brain MRI 06/14/2022. FINDINGS: CT Brain Perfusion Findings: ASPECTS: 10 CBF (<30%) Volume: 0mL. There is a small area of less stringent CBF, but not CBV, abnormality in the anterior left MCA territory cortex. Perfusion (Tmax>6.0s) volume: 82mL, but widespread in the bilateral cerebral white matter and likely artifactual. Questionable more pronounced oligemia anterior left MCA territory cortex. Mismatch Volume: Unclear due to suspected T-max artifact. Infarction Location:No infarct core detected. CTA NECK Skeleton: Pronounced Diffuse idiopathic skeletal hyperostosis (DISH). Associated widespread cervical and  visible upper thoracic interbody ankylosis from bulky flowing endplate osteophytes. No acute osseous abnormality identified. Upper chest: Negative. Other neck: Nonvascular neck soft tissue spaces are within normal limits. Aortic arch: Calcified aortic atherosclerosis. Tortuous arch. Three vessel arch. Right carotid system: Brachiocephalic artery and right CCA origin plaque without stenosis. Tortuous right CCA with widespread but mild calcified plaque. No stenosis to the bifurcation. Moderate calcified plaque at the bifurcation. Tortuous right ICA distal to the bulb.  No stenosis to the skull base. Left carotid system: Similar tortuosity and widespread mild calcified plaque. Moderate calcified plaque at the bifurcation, left ICA origin and bulb. No stenosis results. Vertebral arteries: Proximal right subclavian artery atherosclerosis without significant stenosis. Heavily calcified right vertebral artery origin and proximal V1 segment with severe stenosis on series 7, image 179. The vessel remains patent. No additional plaque or stenosis to the skull base. Proximal left subclavian artery soft and calcified atherosclerosis without stenosis. Calcified plaque at the left vertebral artery origin resulting in moderate to severe stenosis series 7, image 177. Codominant left vertebral artery remains patent with mild additional V3 segment calcified plaque, no stenosis to the skull base. CTA HEAD Posterior circulation: Tortuous distal vertebral arteries and vertebrobasilar junction with abundant calcified plaque, especially in the left V4 segment. Mild-to-moderate left V4 stenosis. Patent PICA origins. No significant right V4 stenosis. Mild vertebrobasilar junction stenosis. Tortuous and heavily calcified basilar artery (series 6, image 124) with no significant basilar stenosis. Patent SCA and PCA origins with calcified plaque. No high-grade origin stenosis. Posterior communicating arteries are diminutive or absent. Tandem severe stenoses of the left PCA P1 and P2 segments (series 11, image 23). Similar severe stenosis at the right PCA P1/P2 junction. Anterior circulation: Both ICA siphons are patent and tortuous. Mild siphon plaque in the petrous segment. Widespread and moderate calcified plaque beginning in the cavernous segment. Moderate distal supraclinoid left ICA stenosis (series 8, image 112). Similar right siphon plaque and moderate distal right ICA supraclinoid stenosis (series 6, image 109). Patent carotid termini, MCA and ACA origins. No high-grade origin stenosis. Diminutive or  absent anterior communicating artery. Bilateral ACA branches are patent with mild irregularity. Left MCA M1 segment and bifurcation are patent. Right MCA mild proximal right M1 stenosis (series 2, image 20). Right M1 and right MCA bifurcation remain patent. Mild to moderate bilateral MCA M2 and distal branch irregularity. No complete branch occlusion is identified. Venous sinuses: Early contrast timing, not well evaluated. Anatomic variants: None. Review of the MIP images confirms the above findings IMPRESSION: 1. CTA is negative for large vessel occlusion, but Positive for Extensive Atherosclerosis throughout the head and neck. 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 - Moderate stenosis both distal supraclinoid ICAs. - Moderate to Severe stenosis bilateral PCA P1 and P2 segments. 2. CT Perfusion affected by artifact. Questionable oligemia in the anterior Left MCA division. 3.  Aortic Atherosclerosis (ICD10-I70.0). 4. 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 Electronically Signed   By: VEAR Hurst M.D.   On: 11/29/2023 04:11   CT HEAD CODE STROKE WO CONTRAST` Result Date: 11/29/2023 CLINICAL DATA:  Code stroke. Initial evaluation for acute neuro deficit, slurred speech, altered mental status. EXAM: CT HEAD WITHOUT CONTRAST TECHNIQUE: Contiguous axial images were obtained from the base of the skull through the vertex without intravenous contrast. RADIATION DOSE REDUCTION: This exam was performed according to the departmental dose-optimization program which includes automated exposure control, adjustment of the mA  and/or kV according to patient size and/or use of iterative reconstruction technique. COMPARISON:  Prior study from 06/14/2022 FINDINGS: Brain: Generalized age-related cerebral atrophy. Patchy and confluent hypodensity involving the supratentorial cerebral white matter, consistent with chronic  small vessel ischemic disease, advanced in nature. Multiple scattered remote infarcts involving the left frontal lobe, left occipital lobe, and right basal ganglia/thalamus. No acute intracranial hemorrhage. No acute large vessel territory infarct. No mass lesion or midline shift. Mild ventricular prominence related global parenchymal volume loss of hydrocephalus. No convincing extra-axial fluid collection. Vascular: No abnormal hyperdense vessel. Extensive calcified atherosclerosis present at the skull base. Skull: Scalp soft tissues demonstrate no acute finding. Appear intact. Sinuses/Orbits: Globes orbital soft tissues within normal limits. Mild mucosal thickening present about the maxillary sinuses. Paranasal sinuses are otherwise clear. Trace right mastoid effusion noted, of doubtful significance. Other: None. ASPECTS [facility] Stroke Program Early CT Score) - Ganglionic level infarction (caudate, lentiform nuclei, internal capsule, insula, M1-M3 cortex): 7 - Supraganglionic infarction (M4-M6 cortex): 3 Total score (0-10 with 10 being normal): 10 IMPRESSION: 1. No acute intracranial abnormality. 2. Aspects is 10. 3. 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 Results were called by telephone at the time of interpretation on 11/29/2023 at 3:33 am to provider VICENTA ABLE , who verbally acknowledged these results. Electronically Signed   By: Morene Hoard M.D.   On: 11/29/2023 03:33   Scheduled Meds:  [START ON 11/30/2023]  stroke: early stages of recovery book   Does not apply Once   apixaban   5 mg Oral BID   aspirin  EC  81 mg Oral Daily   atorvastatin   40 mg Oral Daily   carvedilol   25 mg Oral BID WC   diltiazem   120 mg Oral Daily   escitalopram   10 mg Oral Daily   Fenofibric Acid   135 mg Oral Daily   insulin  aspart  0-5 Units Subcutaneous QHS   insulin  aspart  0-6 Units Subcutaneous TID WC   insulin  glargine-yfgn  7 Units  Subcutaneous Daily   losartan -hydrochlorothiazide   1 tablet Oral Daily   mirabegron  ER  50 mg Oral Daily   Continuous Infusions:  sodium chloride  40 mL/hr at 11/29/23 0551     LOS: 0 days   Time spent: 55 mins  Daniel Bettendorf Vicci, [name] How to contact the [facility] Attending or Consulting provider 7A - 7P or covering provider during after hours 7P -7A, for this patient?  Check the care team in [facility] and look for a) attending/consulting [facility] provider listed and b) the [facility] team listed Log into [email or web address] to find provider on call.  Locate the [facility] provider you are looking for under [facility] and page to a number that you can be directly reached. If you still have difficulty reaching the provider, please page the [facility] (Director on Call) for the Hospitalists listed on amion for assistance.  11/29/2023, 12:22 PM

## 2023-11-29 NOTE — Consult Note (Signed)
 [facility] Tele[facility]urology Consult Services   Patient Name:   [name], [name] Date of Birth:   May 21, 1940 Identification Number:   MRN - [id number] Date of Service:   11/29/2023 03:07:09  Diagnosis:       R47.01 - Aphasia  Impression:      This is an 83 year old man with history of atrial fibrillation, hypertension, hyperlipidemia, aortic stenosis, and diabetes here after reportedly falling in his home, noted to have mild aphasia on my evaluation. He is not a candidate for IV thrombolytics as he is on apixaban . There is no large vessel occlusion  Our recommendations are outlined below.  Recommendations:        Stroke/Telemetry Floor       Neuro Checks       Bedside Swallow Eval       DVT Prophylaxis       IV Fluids, Normal Saline       Head of Bed 30 Degrees       Euglycemia and Avoid Hyperthermia (PRN Acetaminophen )       Hold Anticoagulation for Now       Initiate or continue Aspirin  81 MG daily       Antihypertensives PRN if Blood pressure is greater than 220/120 or there is a concern for End organ damage/contraindications for permissive HTN. If blood pressure is greater than 220/120 give labetalol PO or IV or Vasotec IV with a goal of 15% reduction in BP during the first 24 hours.       MRI brain cardiac monitoring ha1c fasting lipid PT OT Speech as appropriate    ------------------------------------------------------------------------------  Advanced Imaging: CTA Head and Neck Completed.  LVO:No  Patient is not a candidate for NIR   Metrics: Last Known Well: Unknown Dispatch Time: 11/29/2023 03:07:09 Arrival Time: 11/29/2023 03:05:00 Initial Response Time: 11/29/2023 03:19:28 Symptoms: aphasia. Initial patient interaction: 11/29/2023 03:29:00 NIHSS Assessment Completed: 11/29/2023 03:37:00 Patient is not a candidate for Thrombolytic. Thrombolytic Medical Decision: 11/29/2023 03:37:00 Patient was not deemed candidate for Thrombolytic because of  following reasons: Use of NOAC in last 48 hrs. .  CT Head: CT head unremarkable for acute infarction or hemorrhage per Radiology: negative acute  Primary Provider Notified of Diagnostic Impression and Management Plan on: 11/29/2023 04:17:39    ------------------------------------------------------------------------------  History of Present Illness: Patient is a 83 year old Male.  Patient was brought by EMS for symptoms of aphasia. This is an 83 year old man with history of atrial fibrillation, hypertension, hyperlipidemia, aortic stenosis, and diabetes here after reportedly falling in his home, neurology called for stroke alert. The patient's wife reportedly found him on the ground. He was reportedly last known well at 10 pm when he went to bed. The patient was noted to have some word finding difficulties which he stated is new. He was not able to provide many details on the series of events leading to his current hospital visit.   Past Medical History:      Hypertension      Diabetes Mellitus      Hyperlipidemia      Atrial Fibrillation Other PMH:  history of atrial fibrillation, hypertension, hyperlipidemia, aortic stenosis, and diabetes unable to obtain due to:   Patient Is Confused  Medications:  Anticoagulant use:  Yes Eliquis  No Antiplatelet use Reviewed EMR for current medications  Allergies:   Allergies Unable To Obtain Due To: Patient Is Confused  Social History: Unable To Obtain Due To Patient Status : Patient Is Confused  Family  History:  Family History Cannot Be Obtained Because:Patient Is Confused  ROS : ROS Cannot Be Obtained Because:  Patient Is Confused  Past Surgical History: Past Surgical History Cannot Be Obtained Because: Patient Is Confused There Is No Surgical History Contributory To Today's Visit    Examination: BP(143/87), Pulse(87), 1A: Level of Consciousness - Alert; keenly responsive + 0 1B: Ask Month and Age - 1 Question Right +  1 1C: Blink Eyes & Squeeze Hands - Performs Both Tasks + 0 2: Test Horizontal Extraocular Movements - Normal + 0 3: Test Visual Fields - Partial Hemianopia + 1 4: Test Facial Palsy (Use Grimace if Obtunded) - Normal symmetry + 0 5A: Test Left Arm Motor Drift - No Drift for 10 Seconds + 0 5B: Test Right Arm Motor Drift - No Drift for 10 Seconds + 0 6A: Test Left Leg Motor Drift - No Drift for 5 Seconds + 0 6B: Test Right Leg Motor Drift - No Drift for 5 Seconds + 0 7: Test Limb Ataxia (FNF/Heel-Shin) - No Ataxia + 0 8: Test Sensation - Normal; No sensory loss + 0 9: Test Language/Aphasia - Mild-Moderate Aphasia: Some Obvious Changes, Without Significant Limitation + 1 10: Test Dysarthria - Normal + 0 11: Test Extinction/Inattention - No abnormality + 0  NIHSS Score: 3  NIHSS Free Text : had some trouble counting fingers precisely left and right; word finding trouble during conversation; stated he is 83  Pre-Morbid Modified Rankin Scale: 1 Points = No significant disability despite symptoms; able to carry out all usual duties and activities  Spoke with : Dr Geroldine I reviewed the available imaging via Rapid and initiated discussion with the primary provider  This consult was conducted in real time using interactive audio and video technology. Patient was informed of the technology being used for this visit and agreed to proceed. Patient located in hospital and provider located at home/office setting.   Patient is being evaluated for possible acute neurologic impairment and high probability of imminent or life-threatening deterioration. I spent total of 55 minutes providing care to this patient, including time for face to face visit via telemedicine, review of medical records, imaging studies and discussion of findings with providers, the patient and/or family.    Dr Burnard Muskrat   [facility] For Inpatient follow-up with [facility] physician please call [facility] at [phone].  As we are not an outpatient service for any post hospital discharge needs please contact the hospital for assistance. If you have any questions for the [facility] physicians or need to reconsult for clinical or diagnostic changes please contact us  via [facility] at [phone].   Signature : Burnard Muskrat

## 2023-11-29 NOTE — ED Notes (Signed)
 Pt to CT

## 2023-11-29 NOTE — Evaluation (Signed)
 Physical Therapy Evaluation Patient Details Name: [name] MRN: [id number] DOB: 01-11-1941 Today's Date: 11/29/2023  History of Present Illness  [name] is an 83 y.o. male with medical history significant for hypertension, type 2 diabetes mellitus, atrial fibrillation on Eliquis , and history of CVA who presents with speech difficulty.     Patient was reportedly in his usual state when he went to bed last night at 10 PM but apparently fell out of bed early this morning and was noted to have difficulty speaking.  Patient denies headache, focal numbness or weakness, and does not perceive any difficulty with his speech at time of admission.  He does not remember having trouble speaking earlier either.   Clinical Impression  Patient agreeable to OT/PT co-evaluation. On this date, patient required min assist for bed mobility and CGA/min assist during functional transfers and ambulation with RW. Patient demonstrates general weakness throughout, impaired coordination, impaired balance, altered cognition, and visual changes in R field of vision specifically with peripheral vision as compared to L. Patient reports he feels his speech to close to baseline. Patient reports at baseline he is mostly modified independent with mobility and independent with ADLs. Reports he has good support from wife but wife not present to confirm. Patient demonstrates most difficulty with cognitive processing, requiring increased time and step by step cueing at times. 24/7 supervision encouraged for safety once home. Patient will benefit from continued skilled physical therapy acutely and in recommended venue in order to address deficits prior to returning home. Patient tolerated sitting in recliner at end of session, chair alarm set, call button within reach.       If plan is discharge home, recommend the following: Supervision due to cognitive status;A little help with walking and/or transfers;A little help with  bathing/dressing/bathroom;Assist for transportation;Help with stairs or ramp for entrance   Can travel by private vehicle        Equipment Recommendations None recommended by PT  Recommendations for Other Services       Functional Status Assessment Patient has had a recent decline in their functional status and demonstrates the ability to make significant improvements in function in a reasonable and predictable amount of time.     Precautions / Restrictions Precautions Precautions: Fall Recall of Precautions/Restrictions: Impaired Restrictions Weight Bearing Restrictions Per Provider Order: No      Mobility  Bed Mobility Overal bed mobility: Needs Assistance Bed Mobility: Supine to Sit     Supine to sit: HOB elevated, Min assist     General bed mobility comments: HOB slightly elevated as pt reports sleeping propped with pillows, inc time needed for processing instructions, min assist needed to navigate LE to EOB to begin transfer    Transfers Overall transfer level: Needs assistance Equipment used: Rolling walker (2 wheels) Transfers: Sit to/from Stand, Bed to chair/wheelchair/BSC Sit to Stand: Contact guard assist, Min assist   Step pivot transfers: Contact guard assist, Min assist       General transfer comment: CGA/min assist during transfer due to slow processing at times, pt tends to inc distance from RW at times, requiring verbal and physical assist to correct, step by step cues given throughout    Ambulation/Gait Ambulation/Gait assistance: Contact guard assist, Min assist Gait Distance (Feet): 30 Feet Assistive device: Rolling walker (2 wheels) Gait Pattern/deviations: Decreased step length - right, Step-through pattern, Decreased step length - left, Trunk flexed, Decreased stride length Gait velocity: Dec     General Gait Details: Gait distance limited  due to fatigue, pt reporting he'd like to return to room because he could feel LE getting tired, demo  slow labored gait w/ RW, pt tends to inc distance from RW at times, requiring verbal and physical assist to correct, mildly unsteady  Stairs            Wheelchair Mobility     Tilt Bed    Modified Rankin (Stroke Patients Only)       Balance Overall balance assessment: Needs assistance Sitting-balance support: Feet supported, No upper extremity supported Sitting balance-Leahy Scale: Fair Sitting balance - Comments: fair/good seated at EOB   Standing balance support: Bilateral upper extremity supported, During functional activity, Reliant on assistive device for balance Standing balance-Leahy Scale: Fair Standing balance comment: using RW           Pertinent Vitals/Pain Pain Assessment Pain Assessment: No/denies pain    Home Living Family/patient expects to be discharged to:: Assisted living     Home Equipment: Agricultural consultant (2 wheels);Cane - quad;BSC/3in1 Additional Comments: pt reports he is a resident at [facility] facility where he lives wiht his wife. 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     Extremity/Trunk Assessment   Upper Extremity Assessment Upper Extremity Assessment: Defer to OT evaluation RUE Deficits / Details: 4/5 shoulder flexion and abduction which pt reports is baseline. Slow FM coordination ; mild gross motor difficulty as well. RUE Sensation: WNL RUE Coordination: decreased fine motor;decreased gross motor LUE Deficits / Details: Generally weak. LUE Sensation: WNL LUE Coordination: decreased fine motor;decreased gross motor    Lower Extremity Assessment Lower Extremity Assessment:  Generalized weakness (No stark difference between R/LLE strength, MMT hip flexion 4-/5 bilat, knee ext 4-/5 bilat, ankle DF 4/5 bilat. Alternating foot taps impaired. heel to shin equal bilaterally. LE sensation WNL)    Cervical / Trunk Assessment Cervical / Trunk Assessment: Kyphotic  Communication   Communication Communication: Impaired Factors Affecting Communication: Difficulty expressing self;Other (comment) (Slow speech, difficulty with word finding at times)    Cognition Arousal: Alert Behavior During Therapy: [facility] for tasks assessed/performed   PT - Cognitive impairments: No family/caregiver present to determine baseline         PT - Cognition Comments: alert and oriented to self and place when assessed by nursing staff. Disoriented to time Following commands: Impaired Following commands impaired: Follows one step commands with increased time     Cueing Cueing Techniques: Verbal cues, Tactile cues, Gestural cues, Visual cues     General Comments      Exercises     Assessment/Plan    PT Assessment Patient needs continued PT services;All further PT needs can be met in the next venue of care  PT Problem List Decreased strength;Decreased range of motion;Decreased activity tolerance;Decreased balance;Decreased mobility;Decreased cognition       PT Treatment Interventions DME instruction;Balance training;Gait training;Functional mobility training;Therapeutic activities;Therapeutic exercise;Patient/family education    PT Goals (Current goals can be found in the Care Plan section)  Acute Rehab PT Goals Patient Stated Goal: Return home PT Goal Formulation: With patient Time For Goal Achievement: 12/13/23 Potential to Achieve Goals: Good  Frequency Min 3X/week     Co-evaluation PT/OT/SLP Co-Evaluation/Treatment: Yes Reason for Co-Treatment: To address functional/ADL transfers PT goals addressed during session: Mobility/safety with mobility OT goals addressed  during session: ADL's and self-care       AM-PAC PT 6 Clicks Mobility  Outcome Measure Help needed turning from your back to your side while in a flat bed without using bedrails?: A Little Help needed moving from lying on your back to sitting on the side of a flat bed without using bedrails?: A Little Help needed moving to and from a bed to a chair (including a wheelchair)?: A Little Help needed standing up from a chair using your arms (e.g., wheelchair or bedside chair)?: A Little Help needed to walk in hospital room?: A Little Help needed climbing 3-5 steps with a railing? : A Lot 6 Click Score: 17    End of Session Equipment Utilized During Treatment: Gait belt Activity Tolerance: Patient tolerated treatment well;Patient limited by fatigue Patient left: in chair;with call bell/phone within reach;with chair alarm set Nurse Communication: Mobility status PT Visit Diagnosis: Other abnormalities of gait and mobility (R26.89);History of falling (Z91.81);Muscle weakness (generalized) (M62.81)    Time: 9159-9082 PT Time Calculation (min) (ACUTE ONLY): 37 min   Charges:   PT Evaluation $PT Eval Moderate Complexity: 1 Mod   PT General Charges $$ ACUTE PT VISIT: 1 Visit         12:38 PM, 11/29/23 Eldoris Beiser Powell-Butler, PT, DPT [place] with [facility]

## 2023-11-29 NOTE — Plan of Care (Signed)

## 2023-11-29 NOTE — NC FL2 (Signed)
 [place]  MEDICAID FL2 LEVEL OF CARE FORM     IDENTIFICATION  Patient Name: [name] Birthdate: November 10, 1940 Sex: male Admission Date (Current Location): 11/29/2023  [facility] and [place] Number:  [employer] and Address:  [facility],  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   Essential hypertension 11/18/2017   SIRS (systemic inflammatory response syndrome) (HCC) 08/17/2017   Aortic stenosis 04/27/2014   PAF (paroxysmal atrial fibrillation) (HCC) 09/15/2013   Current use of long term anticoagulation 09/15/2013   Febrile illness, acute 10/26/2011   Cough 10/26/2011   Persistent atrial fibrillation (HCC) 05/15/2011   DM type 2 (diabetes mellitus, type 2) (HCC) 05/15/2011   Hyperlipemia 05/15/2011   Obesity 05/15/2011   Benign hypertension 05/15/2011   H/O: stroke 05/15/2011    Orientation RESPIRATION BLADDER Height & Weight     Self, Time, Place  Normal Continent Weight: 181 lb 14.1 oz (82.5 kg) Height:     BEHAVIORAL SYMPTOMS/MOOD NEUROLOGICAL BOWEL NUTRITION STATUS      Continent Diet (See d/c summary)  AMBULATORY STATUS COMMUNICATION OF NEEDS Skin   Extensive Assist Verbally Normal                       Personal Care Assistance Level of Assistance  Bathing, Feeding, Dressing Bathing Assistance: Maximum assistance Feeding assistance: Limited assistance Dressing Assistance: Maximum assistance     Functional Limitations Info  Hearing, Speech, Sight Sight Info: Adequate Hearing  Info: Adequate Speech Info: Adequate    SPECIAL CARE FACTORS FREQUENCY  PT (By licensed PT), OT (By licensed OT)     PT Frequency: 5x weekly OT Frequency: 5x weekly            Contractures      Additional Factors Info  Code Status, Allergies, Psychotropic Code Status Info: Full Allergies Info: No known allergies Psychotropic Info: Lexapro          Current Medications (11/29/2023):  This is the current hospital active medication list Current Facility-Administered Medications  Medication Dose Route Frequency Provider Last Rate Last Admin   [START ON 11/30/2023]  stroke: early stages of recovery book   Does not apply Once Opyd, Timothy S, MD       0.9 %  sodium chloride  infusion   Intravenous Continuous Opyd, Timothy S, MD 40 mL/hr at 11/29/23 0551 New Bag at 11/29/23 0551   acetaminophen  (TYLENOL ) tablet 650 mg  650 mg Oral Q4H PRN Opyd, Timothy S, MD       Or   acetaminophen  (TYLENOL ) 160 MG/5ML solution 650 mg  650 mg Per Tube Q4H PRN Opyd, Timothy S, MD       Or   acetaminophen  (TYLENOL ) suppository 650 mg  650 mg Rectal Q4H PRN Opyd, Timothy S, MD       alum & mag hydroxide-simeth (MAALOX/MYLANTA) 200-200-20 MG/5ML suspension 30 mL  30 mL Oral Q4H PRN Johnson, Clanford L, MD       apixaban  (ELIQUIS ) tablet 5 mg  5  mg Oral BID Johnson, Clanford L, MD       artificial tears ophthalmic solution 1 drop  1 drop Both Eyes PRN Johnson, Clanford L, MD       aspirin  EC tablet 81 mg  81 mg Oral Daily Opyd, Timothy S, MD   81 mg at 11/29/23 0843   atorvastatin  (LIPITOR) tablet 40 mg  40 mg Oral Daily Opyd, Timothy S, MD   40 mg at 11/29/23 9156   carvedilol  (COREG ) tablet 25 mg  25 mg Oral BID WC Johnson, Clanford L, MD       diltiazem  (CARDIZEM  CD) 24 hr capsule 120 mg  120 mg Oral Daily Johnson, Clanford L, MD       escitalopram  (LEXAPRO ) tablet 10 mg  10 mg Oral Daily Johnson, Clanford L, MD       Fenofibric Acid  CPDR 135 mg  135 mg Oral Daily Johnson, Clanford L, MD        guaiFENesin -dextromethorphan  (ROBITUSSIN DM) 100-10 MG/5ML syrup 5 mL  5 mL Oral Q4H PRN Johnson, Clanford L, MD       hydrocortisone  (ANUSOL -HC) 2.5 % rectal cream 1 Application  1 Application Topical QID PRN Johnson, Clanford L, MD       hydrocortisone  cream 1 % 1 Application  1 Application Topical TID PRN Johnson, Clanford L, MD       insulin  aspart (novoLOG ) injection 0-5 Units  0-5 Units Subcutaneous QHS Opyd, Timothy S, MD       insulin  aspart (novoLOG ) injection 0-6 Units  0-6 Units Subcutaneous TID WC Opyd, Timothy S, MD       insulin  glargine-yfgn (SEMGLEE ) injection 7 Units  7 Units Subcutaneous Daily Johnson, Clanford L, MD       lip balm (CARMEX) ointment 1 Application  1 Application Topical PRN Johnson, Clanford L, MD       loratadine  (CLARITIN ) tablet 10 mg  10 mg Oral Daily PRN Johnson, Clanford L, MD       losartan -hydrochlorothiazide  (HYZAAR) 100-25 MG per tablet 1 tablet  1 tablet Oral Daily Johnson, Clanford L, MD       mirabegron  ER (MYRBETRIQ ) tablet 50 mg  50 mg Oral Daily Opyd, Timothy S, MD   50 mg at 11/29/23 9156   Muscle Rub CREA 1 Application  1 Application Topical PRN Johnson, Clanford L, MD       ondansetron  (ZOFRAN ) injection 4 mg  4 mg Intravenous Q6H PRN Opyd, Timothy S, MD       phenol (CHLORASEPTIC) mouth spray 1 spray  1 spray Mouth/Throat PRN Johnson, Clanford L, MD       senna-docusate (Senokot-S) tablet 1 tablet  1 tablet Oral QHS PRN Opyd, Timothy S, MD       sodium chloride  (OCEAN) 0.65 % nasal spray 1 spray  1 spray Each Nare PRN Vicci Afton CROME, MD         Discharge Medications: Please see discharge summary for a list of discharge medications.  Relevant Imaging Results:  Relevant Lab Results:   Additional Information SSN: [id number]  [place] Saddie Kim, LCSW

## 2023-11-29 NOTE — ED Triage Notes (Signed)
 [facility] emergency traffic for code stroke. Patient from the independent side of the facility.  Ems was called by the wife. Patient rolled out of bed and was found in the floor. Patient experiencing slurred speech and weakness.  LKW was 10pm when he went to bed

## 2023-11-29 NOTE — Progress Notes (Signed)
 Called pts wife to update and no answer. [phone] Iniko Robles (wife)

## 2023-11-30 ENCOUNTER — Other Ambulatory Visit: Payer: Self-pay

## 2023-11-30 DIAGNOSIS — E1169 Type 2 diabetes mellitus with other specified complication: Secondary | ICD-10-CM | POA: Diagnosis not present

## 2023-11-30 DIAGNOSIS — Z8679 Personal history of other diseases of the circulatory system: Secondary | ICD-10-CM | POA: Diagnosis not present

## 2023-11-30 DIAGNOSIS — E1159 Type 2 diabetes mellitus with other circulatory complications: Secondary | ICD-10-CM | POA: Diagnosis not present

## 2023-11-30 DIAGNOSIS — G4730 Sleep apnea, unspecified: Secondary | ICD-10-CM | POA: Diagnosis not present

## 2023-11-30 DIAGNOSIS — G459 Transient cerebral ischemic attack, unspecified: Secondary | ICD-10-CM | POA: Diagnosis not present

## 2023-11-30 DIAGNOSIS — E119 Type 2 diabetes mellitus without complications: Secondary | ICD-10-CM | POA: Diagnosis not present

## 2023-11-30 DIAGNOSIS — E782 Mixed hyperlipidemia: Secondary | ICD-10-CM | POA: Diagnosis not present

## 2023-11-30 DIAGNOSIS — I639 Cerebral infarction, unspecified: Secondary | ICD-10-CM | POA: Diagnosis not present

## 2023-11-30 DIAGNOSIS — M6281 Muscle weakness (generalized): Secondary | ICD-10-CM | POA: Diagnosis not present

## 2023-11-30 DIAGNOSIS — F0390 Unspecified dementia without behavioral disturbance: Secondary | ICD-10-CM | POA: Diagnosis not present

## 2023-11-30 DIAGNOSIS — Z8673 Personal history of transient ischemic attack (TIA), and cerebral infarction without residual deficits: Secondary | ICD-10-CM | POA: Diagnosis not present

## 2023-11-30 DIAGNOSIS — E1165 Type 2 diabetes mellitus with hyperglycemia: Secondary | ICD-10-CM | POA: Diagnosis not present

## 2023-11-30 DIAGNOSIS — F339 Major depressive disorder, recurrent, unspecified: Secondary | ICD-10-CM | POA: Diagnosis not present

## 2023-11-30 DIAGNOSIS — E1351 Other specified diabetes mellitus with diabetic peripheral angiopathy without gangrene: Secondary | ICD-10-CM | POA: Diagnosis not present

## 2023-11-30 DIAGNOSIS — I152 Hypertension secondary to endocrine disorders: Secondary | ICD-10-CM | POA: Diagnosis not present

## 2023-11-30 DIAGNOSIS — I480 Paroxysmal atrial fibrillation: Secondary | ICD-10-CM | POA: Diagnosis not present

## 2023-11-30 DIAGNOSIS — N3281 Overactive bladder: Secondary | ICD-10-CM | POA: Diagnosis not present

## 2023-11-30 DIAGNOSIS — I69354 Hemiplegia and hemiparesis following cerebral infarction affecting left non-dominant side: Secondary | ICD-10-CM | POA: Diagnosis not present

## 2023-11-30 DIAGNOSIS — Z79899 Other long term (current) drug therapy: Secondary | ICD-10-CM | POA: Diagnosis not present

## 2023-11-30 DIAGNOSIS — Z794 Long term (current) use of insulin: Secondary | ICD-10-CM | POA: Diagnosis not present

## 2023-11-30 DIAGNOSIS — I700 Atherosclerosis of aorta: Secondary | ICD-10-CM | POA: Diagnosis not present

## 2023-11-30 DIAGNOSIS — I10 Essential (primary) hypertension: Secondary | ICD-10-CM | POA: Diagnosis not present

## 2023-11-30 DIAGNOSIS — E785 Hyperlipidemia, unspecified: Secondary | ICD-10-CM | POA: Diagnosis not present

## 2023-11-30 DIAGNOSIS — R1312 Dysphagia, oropharyngeal phase: Secondary | ICD-10-CM | POA: Diagnosis not present

## 2023-11-30 DIAGNOSIS — R4701 Aphasia: Secondary | ICD-10-CM | POA: Diagnosis not present

## 2023-11-30 DIAGNOSIS — R2689 Other abnormalities of gait and mobility: Secondary | ICD-10-CM | POA: Diagnosis not present

## 2023-11-30 DIAGNOSIS — I69320 Aphasia following cerebral infarction: Secondary | ICD-10-CM | POA: Diagnosis not present

## 2023-11-30 DIAGNOSIS — I4819 Other persistent atrial fibrillation: Secondary | ICD-10-CM | POA: Diagnosis not present

## 2023-11-30 LAB — CBC
HCT: 39.3 % (ref 39.0–52.0)
Hemoglobin: 13.2 g/dL (ref 13.0–17.0)
MCH: 31.7 pg (ref 26.0–34.0)
MCHC: 33.6 g/dL (ref 30.0–36.0)
MCV: 94.5 fL (ref 80.0–100.0)
Platelets: 128 K/uL — ABNORMAL LOW (ref 150–400)
RBC: 4.16 MIL/uL — ABNORMAL LOW (ref 4.22–5.81)
RDW: 12.6 % (ref 11.5–15.5)
WBC: 4.9 K/uL (ref 4.0–10.5)
nRBC: 0 % (ref 0.0–0.2)

## 2023-11-30 LAB — BASIC METABOLIC PANEL WITH GFR
Anion gap: 7 (ref 5–15)
BUN: 19 mg/dL (ref 8–23)
CO2: 26 mmol/L (ref 22–32)
Calcium: 8.9 mg/dL (ref 8.9–10.3)
Chloride: 107 mmol/L (ref 98–111)
Creatinine, Ser: 1 mg/dL (ref 0.61–1.24)
GFR, Estimated: >60 mL/min (ref >60)
Glucose, Bld: 144 mg/dL — ABNORMAL HIGH (ref 70–99)
Potassium: 3.5 mmol/L (ref 3.5–5.1)
Sodium: 140 mmol/L (ref 135–145)

## 2023-11-30 LAB — HEMOGLOBIN A1C
Hgb A1c MFr Bld: 7.6 % — ABNORMAL HIGH (ref 4.8–5.6)
Mean Plasma Glucose: 171 mg/dL

## 2023-11-30 LAB — GLUCOSE, CAPILLARY
Glucose-Capillary: 133 mg/dL — ABNORMAL HIGH (ref 70–99)
Glucose-Capillary: 144 mg/dL — ABNORMAL HIGH (ref 70–99)

## 2023-11-30 MED ORDER — LANTUS SOLOSTAR 100 UNIT/ML ~~LOC~~ SOPN: 7.0000 [IU] | PEN_INJECTOR | Freq: Every day | SUBCUTANEOUS | Status: DC

## 2023-11-30 MED ORDER — LINAGLIPTIN 5 MG PO TABS: 5.0000 mg | ORAL_TABLET | Freq: Every day | ORAL | Status: DC

## 2023-11-30 MED ORDER — ATORVASTATIN CALCIUM 40 MG PO TABS: 40.0000 mg | ORAL_TABLET | Freq: Every evening | ORAL | Status: DC

## 2023-11-30 MED ORDER — HYDROCORTISONE (PERIANAL) 2.5 % EX CREA: 1.0000 | TOPICAL_CREAM | Freq: Four times a day (QID) | CUTANEOUS | Status: AC | PRN

## 2023-11-30 NOTE — Plan of Care (Signed)
  Problem: Coping: Goal: Ability to adjust to condition or change in health will improve Outcome: Progressing   Problem: Fluid Volume: Goal: Ability to maintain a balanced intake and output will improve Outcome: Progressing   Problem: Health Behavior/Discharge Planning: Goal: Ability to identify and utilize available resources and services will improve Outcome: Progressing Goal: Ability to manage health-related needs will improve Outcome: Progressing   Problem: Metabolic: Goal: Ability to maintain appropriate glucose levels will improve Outcome: Progressing   Problem: Skin Integrity: Goal: Risk for impaired skin integrity will decrease Outcome: Progressing   Problem: Tissue Perfusion: Goal: Adequacy of tissue perfusion will improve Outcome: Progressing   Problem: Education: Goal: Knowledge of disease or condition will improve Outcome: Progressing Goal: Knowledge of secondary prevention will improve (MUST DOCUMENT ALL) Outcome: Progressing Goal: Knowledge of patient specific risk factors will improve (DELETE if not current risk factor) Outcome: Progressing   Problem: Ischemic Stroke/TIA Tissue Perfusion: Goal: Complications of ischemic stroke/TIA will be minimized Outcome: Progressing   Problem: Coping: Goal: Will verbalize positive feelings about self Outcome: Progressing Goal: Will identify appropriate support needs Outcome: Progressing   Problem: Health Behavior/Discharge Planning: Goal: Ability to manage health-related needs will improve Outcome: Progressing

## 2023-11-30 NOTE — Discharge Summary (Signed)
 Physician Discharge Summary  [name] FMW:[id number] DOB: Apr 21, 1940 DOA: 11/29/2023  PCP: Nichole Senior, MD  Admit date: 11/29/2023 Discharge date: 11/30/2023  Admitted From: Home Disposition: SNF   Recommendations for Outpatient Follow-up:  Follow up with PCP in 1-2 weeks Monitor blood sugar at least 3 times per day per facility protocol Monitor for bleeding complications on full dose anticoagulation Please check CBC and BMP in 1 week   Home Health:  SNF   Discharge Condition: STABLE   CODE STATUS: FULL DIET: heart healthy/carb modified    Brief Hospitalization Summary: Please see all hospital notes, images, labs for full details of the hospitalization. Admission provider HPI:  83 y.o. male with medical history significant for hypertension, type 2 diabetes mellitus, atrial fibrillation on Eliquis , and history of CVA who presents with speech difficulty.   Patient was reportedly in his usual state when he went to bed last night at 10 PM but apparently fell out of bed early this morning and was noted to have difficulty speaking.  Patient denies headache, focal numbness or weakness, and does not perceive any difficulty with his speech at time of admission.  He does not remember having trouble speaking earlier either.   ED Course: Upon arrival to the ED, patient is found to be afebrile and saturating well on room air with normal HR and stable BP.  There is no acute finding on head CT and no large vessel occlusion on CTA of the head and neck.   Patient was evaluated by teleneurologist in the emergency department and it was recommended that he be admitted for stroke workup.     He passed a stroke swallow screen in the ED.   Question of TIA with Aphasia - RESOLVED  - No acute findings on CT or MRI brain;  no emergent large vessel occlusion on CTA head & neck   - stable cardiac monitoring and neuro checks, he was started on aspirin  81 mg by neurologist recommendation but pt concerned  about additional risk of bleeding and did not want to continue, he says he will discuss further with his PCP, he was agreeable to continuing apixaban .  - Pt was seen by SLP and he was evaluated for speech/language but he seems to be back to his baseline now   Controlled Type 2 DM  - A1c 7.6% which could be acceptable given his advanced age, co-morbidities and fall risk on anticoagulation - Check CBGs, use basal insulin  and short-acting correctional insulin     CBG (last 3)  Recent Labs (last 2 labs)       Recent Labs    11/29/23 0556 11/29/23 0714 11/29/23 1125  GLUCAP 129* 134* 150*       Atrial fibrillation  - resumed apixaban  with negative CT and MRI brain, no hemorrhage seen    Hypertension  - with negative CT and MRI resumed home BP lowering medications   Gait instability - PT recommending SNF rehab placement - [facility] to [facility] -- Pt will discharge to SNF    Discharge Diagnoses:  Principal Problem:   Aphasia Active Problems:   DM type 2 (diabetes mellitus, type 2) (HCC)   PAF (paroxysmal atrial fibrillation) (HCC)   Essential hypertension   TIA (transient ischemic attack)   Discharge Instructions:  Allergies as of 11/30/2023   No Known Allergies      Medication List     STOP taking these medications    diltiazem  120 MG 24 hr capsule Commonly known as: Cardizem  CD  insulin  lispro 100 UNIT/ML KwikPen Commonly known as: HUMALOG   simvastatin 80 MG tablet Commonly known as: ZOCOR Replaced by: atorvastatin  40 MG tablet       TAKE these medications    apixaban  5 MG Tabs tablet Commonly known as: ELIQUIS  Take 1 tablet (5 mg total) by mouth 2 (two) times daily.   atorvastatin  40 MG tablet Commonly known as: LIPITOR Take 1 tablet (40 mg total) by mouth every evening. Replaces: simvastatin 80 MG tablet   carvedilol  25 MG tablet Commonly known as: COREG  Take 25 mg by mouth 2 (two) times daily with a meal.   escitalopram  10 MG tablet Commonly known  as: LEXAPRO  Take 10 mg by mouth daily.   Fenofibric Acid  135 MG Cpdr Take 135 mg by mouth daily.   FreeStyle Libre 3 Plus Sensor Misc Change sensor every 15 days.   hydrocortisone  2.5 % rectal cream Commonly known as: ANUSOL -HC Apply 1 Application topically 4 (four) times daily as needed for hemorrhoids or anal itching.   Lantus  SoloStar 100 UNIT/ML Solostar Pen Generic drug: insulin  glargine Inject 7 Units into the skin daily. What changed: See the new instructions.   linagliptin  5 MG Tabs tablet Commonly known as: Tradjenta  Take 1 tablet (5 mg total) by mouth daily.   losartan -hydrochlorothiazide  100-25 MG tablet Commonly known as: HYZAAR Take 1 tablet by mouth daily.   mirabegron  ER 50 MG Tb24 tablet Commonly known as: MYRBETRIQ  Take 50 mg by mouth daily.   OneTouch Verio test strip Generic drug: glucose blood SMARTSIG:1 Strip(s) Via Meter 5 Times Daily        Contact information for follow-up providers     Nichole Senior, MD. Schedule an appointment as soon as possible for a visit in 1 week(s).   Specialty: Endocrinology Why: Hospital Follow Up Contact information: [address] [place] [address] [phone]              Contact information for after-discharge care     Destination     [facility] .   Service: Skilled Nursing Contact information: [address] [place]  [address] [phone]                    No Known Allergies Allergies as of 11/30/2023   No Known Allergies      Medication List     STOP taking these medications    diltiazem  120 MG 24 hr capsule Commonly known as: Cardizem  CD   insulin  lispro 100 UNIT/ML KwikPen Commonly known as: HUMALOG   simvastatin 80 MG tablet Commonly known as: ZOCOR Replaced by: atorvastatin  40 MG tablet       TAKE these medications    apixaban  5 MG Tabs tablet Commonly known as: ELIQUIS  Take 1 tablet (5 mg total) by mouth 2 (two) times  daily.   atorvastatin  40 MG tablet Commonly known as: LIPITOR Take 1 tablet (40 mg total) by mouth every evening. Replaces: simvastatin 80 MG tablet   carvedilol  25 MG tablet Commonly known as: COREG  Take 25 mg by mouth 2 (two) times daily with a meal.   escitalopram  10 MG tablet Commonly known as: LEXAPRO  Take 10 mg by mouth daily.   Fenofibric Acid  135 MG Cpdr Take 135 mg by mouth daily.   FreeStyle Libre 3 Plus Sensor Misc Change sensor every 15 days.   hydrocortisone  2.5 % rectal cream Commonly known as: ANUSOL -HC Apply 1 Application topically 4 (four) times daily as needed for hemorrhoids or  anal itching.   Lantus  SoloStar 100 UNIT/ML Solostar Pen Generic drug: insulin  glargine Inject 7 Units into the skin daily. What changed: See the new instructions.   linagliptin  5 MG Tabs tablet Commonly known as: Tradjenta  Take 1 tablet (5 mg total) by mouth daily.   losartan -hydrochlorothiazide  100-25 MG tablet Commonly known as: HYZAAR Take 1 tablet by mouth daily.   mirabegron  ER 50 MG Tb24 tablet Commonly known as: MYRBETRIQ  Take 50 mg by mouth daily.   OneTouch Verio test strip Generic drug: glucose blood SMARTSIG:1 Strip(s) Via Meter 5 Times Daily        Procedures/Studies: ECHOCARDIOGRAM COMPLETE Result Date: 11/29/2023    ECHOCARDIOGRAM REPORT   Patient Name:   [name] Date of Exam: 11/29/2023 Medical Rec #:  [id number]    Height:       68.0 in Accession #:    [id number]   Weight:       181.9 lb Date of Birth:  June 13, 1940    BSA:          1.963 m Patient Age:    83 years     BP:           142/66 mmHg Patient Gender: M            HR:           73 bpm. Exam Location:  [facility] Procedure: 2D Echo, Cardiac Doppler, Color Doppler and Saline Contrast Bubble            Study (Both Spectral and Color Flow Doppler were utilized during            procedure). Indications:    Stroke l63.9  History:        Patient has prior history of Echocardiogram examinations, most                  recent 03/14/2023. Arrythmias:Atrial Fibrillation; Risk                 Factors:Hypertension, Diabetes and Sleep Apnea.  Sonographer:    Aida Pizza RCS Referring Phys: [id number] TIMOTHY S OPYD IMPRESSIONS  1. Left ventricular ejection fraction, by estimation, is 60 to 65%. The left ventricle has normal function. The left ventricle has no regional wall motion abnormalities. There is mild left ventricular hypertrophy. Left ventricular diastolic function could not be evaluated.  2. Right ventricular systolic function is normal. The right ventricular size is normal. There is mildly elevated pulmonary artery systolic pressure. The estimated right ventricular systolic pressure is 43.5 mmHg.  3. Left atrial size was severely dilated.  4. Right atrial size was severely dilated.  5. The mitral valve is abnormal. Mild mitral valve regurgitation. No evidence of mitral stenosis. Moderate mitral annular calcification.  6. The tricuspid valve is abnormal. Tricuspid valve regurgitation is moderate to severe.  7. The aortic valve is calcified. There is severe calcifcation of the aortic valve. There is moderate thickening of the aortic valve. Aortic valve regurgitation is moderate. Mild to moderate aortic valve stenosis. Aortic regurgitation PHT measures 692 msec. Aortic valve area, by VTI measures 1.21 cm. Aortic valve mean gradient measures 10.5 mmHg. Aortic valve Vmax measures 2.33 m/s. DVI is 0.31.  8. The inferior vena cava is dilated in size with >50% respiratory variability, suggesting right atrial pressure of 8 mmHg.  9. Agitated saline contrast bubble study was negative, with no evidence of any interatrial shunt. FINDINGS  Left Ventricle: Left ventricular ejection fraction, by estimation, is 60 to  65%. The left ventricle has normal function. The left ventricle has no regional wall motion abnormalities. Strain was performed and the global longitudinal strain is indeterminate. The left ventricular internal  cavity size was normal in size. There is mild left ventricular hypertrophy. Left ventricular diastolic function could not be evaluated due to atrial fibrillation. Left ventricular diastolic function could not be evaluated. Right Ventricle: The right ventricular size is normal. No increase in right ventricular wall thickness. Right ventricular systolic function is normal. There is mildly elevated pulmonary artery systolic pressure. The tricuspid regurgitant velocity is 2.98  m/s, and with an assumed right atrial pressure of 8 mmHg, the estimated right ventricular systolic pressure is 43.5 mmHg. Left Atrium: Left atrial size was severely dilated. Right Atrium: Right atrial size was severely dilated. Pericardium: There is no evidence of pericardial effusion. Mitral Valve: The mitral valve is abnormal. Moderate mitral annular calcification. Mild mitral valve regurgitation. No evidence of mitral valve stenosis. Tricuspid Valve: The tricuspid valve is abnormal. Tricuspid valve regurgitation is moderate to severe. No evidence of tricuspid stenosis. Aortic Valve: The aortic valve is calcified. There is severe calcifcation of the aortic valve. There is moderate thickening of the aortic valve. Aortic valve regurgitation is moderate. Aortic regurgitation PHT measures 692 msec. Mild to moderate aortic stenosis is present. Aortic valve mean gradient measures 10.5 mmHg. Aortic valve peak gradient measures 21.7 mmHg. Aortic valve area, by VTI measures 1.21 cm. Pulmonic Valve: The pulmonic valve was thickened with good excursion. Pulmonic valve regurgitation is trivial. No evidence of pulmonic stenosis. Aorta: The aortic root is normal in size and structure. Venous: The inferior vena cava is dilated in size with greater than 50% respiratory variability, suggesting right atrial pressure of 8 mmHg. IAS/Shunts: No atrial level shunt detected by color flow Doppler. Agitated saline contrast was given intravenously to evaluate for  intracardiac shunting. Agitated saline contrast bubble study was negative, with no evidence of any interatrial shunt. Additional Comments: 3D was performed not requiring image post processing on an independent workstation and was indeterminate.  LEFT VENTRICLE PLAX 2D LVIDd:         4.40 cm LVIDs:         2.50 cm LV PW:         1.10 cm LV IVS:        1.20 cm LVOT diam:     2.20 cm LV SV:         55 LV SV Index:   28 LVOT Area:     3.80 cm  RIGHT VENTRICLE TAPSE (M-mode): 2.0 cm LEFT ATRIUM             Index        RIGHT ATRIUM           Index LA diam:        5.00 cm 2.55 cm/m   RA Area:     27.20 cm LA Vol (A2C):   85.3 ml 43.46 ml/m  RA Volume:   80.80 ml  41.16 ml/m LA Vol (A4C):   95.0 ml 48.40 ml/m LA Biplane Vol: 92.9 ml 47.33 ml/m  AORTIC VALVE AV Area (Vmax):    1.18 cm AV Area (Vmean):   1.24 cm AV Area (VTI):     1.21 cm AV Vmax:           233.00 cm/s AV Vmean:          141.500 cm/s AV VTI:  0.457 m AV Peak Grad:      21.7 mmHg AV Mean Grad:      10.5 mmHg LVOT Vmax:         72.40 cm/s LVOT Vmean:        46.000 cm/s LVOT VTI:          0.146 m LVOT/AV VTI ratio: 0.32 AI PHT:            692 msec  AORTA Ao Root diam: 3.70 cm MITRAL VALVE               TRICUSPID VALVE MV Area (PHT): 3.12 cm    TR Peak grad:   35.5 mmHg MV Decel Time: 243 msec    TR Vmax:        298.00 cm/s MV E velocity: 73.30 cm/s                            SHUNTS                            Systemic VTI:  0.15 m                            Systemic Diam: 2.20 cm Vishnu Priya Mallipeddi Electronically signed by Diannah Late Mallipeddi Signature Date/Time: 11/29/2023/3:55:07 PM    Final    MR BRAIN WO CONTRAST Result Date: 11/29/2023 EXAM: MR Brain without Intravenous Contrast. CLINICAL HISTORY: 83 year old male with type 2 diabetes, persistent atrial fibrillation, and hypertension, presenting with acute neuro deficit and suspected stroke. Last seen normal at 10 PM, found on the floor confused. Symptoms improving upon  arrival. TECHNIQUE: Magnetic resonance images of the brain without intravenous contrast in multiple planes. CONTRAST: Without; COMPARISON: MRI brain 06/14/2022, CTA head and neck 11/29/2023 FINDINGS: BRAIN: No evidence of acute infarct within the limits of patient motion artifact. 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 Background of severe chronic small vessel disease. Tortuosity of the vertebral basilar system with diminished flow voids on T2-weighted imaging likely due to in-plane flow. No intracranial mass or hemorrhage. No midline shift or extra-axial fluid collection. No cerebellar tonsillar ectopia. VENTRICLES: No hydrocephalus. ORBITS: The orbits are normal. SINUSES AND MASTOIDS: The sinuses and mastoid air cells are clear. BONES: No acute fracture or focal osseous lesion. IMPRESSION: 1. No evidence of acute infarct within limits of motion artifact. 2. 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 3. Severe chronic small vessel disease. Electronically signed by: Ryan Chess MD 11/29/2023 11:28 AM EDT RP Workstation: [id number]   CT ANGIO HEAD NECK W WO CM W PERF (CODE STROKE) Result Date: 11/29/2023 CLINICAL DATA:  83 year old male neurologic deficit, found down fallen out of bed. Slurred speech and weakness. EXAM: CT ANGIOGRAPHY HEAD AND NECK CT PERFUSION BRAIN TECHNIQUE: Multidetector CT imaging of the head and neck was performed using the standard protocol during bolus administration of intravenous contrast. Multiplanar CT image reconstructions and MIPs were obtained to evaluate the vascular anatomy. Carotid stenosis measurements (when applicable) are obtained utilizing NASCET criteria, using the distal internal carotid diameter as the denominator. Multiphase CT imaging of the brain was performed following IV bolus contrast injection. Subsequent parametric perfusion maps were  calculated using RAPID software. RADIATION DOSE REDUCTION: This exam was performed according to the  departmental dose-optimization program which includes automated exposure control, adjustment of the mA and/or kV according to patient size and/or use of iterative reconstruction technique. CONTRAST:  OMNIPAQUE  IOHEXOL  350 MG/ML SOLN COMPARISON:  Plain head CT 0318 hours today.  Brain MRI 06/14/2022. FINDINGS: CT Brain Perfusion Findings: ASPECTS: 10 CBF (<30%) Volume: 0mL. There is a small area of less stringent CBF, but not CBV, abnormality in the anterior left MCA territory cortex. Perfusion (Tmax>6.0s) volume: 82mL, but widespread in the bilateral cerebral white matter and likely artifactual. Questionable more pronounced oligemia anterior left MCA territory cortex. Mismatch Volume: Unclear due to suspected T-max artifact. Infarction Location:No infarct core detected. CTA NECK Skeleton: Pronounced Diffuse idiopathic skeletal hyperostosis (DISH). Associated widespread cervical and visible upper thoracic interbody ankylosis from bulky flowing endplate osteophytes. No acute osseous abnormality identified. Upper chest: Negative. Other neck: Nonvascular neck soft tissue spaces are within normal limits. Aortic arch: Calcified aortic atherosclerosis. Tortuous arch. Three vessel arch. Right carotid system: Brachiocephalic artery and right CCA origin plaque without stenosis. Tortuous right CCA with widespread but mild calcified plaque. No stenosis to the bifurcation. Moderate calcified plaque at the bifurcation. Tortuous right ICA distal to the bulb. No stenosis to the skull base. Left carotid system: Similar tortuosity and widespread mild calcified plaque. Moderate calcified plaque at the bifurcation, left ICA origin and bulb. No stenosis results. Vertebral arteries: Proximal right subclavian artery atherosclerosis without significant stenosis. Heavily calcified right vertebral artery origin and proximal V1 segment  with severe stenosis on series 7, image 179. The vessel remains patent. No additional plaque or stenosis to the skull base. Proximal left subclavian artery soft and calcified atherosclerosis without stenosis. Calcified plaque at the left vertebral artery origin resulting in moderate to severe stenosis series 7, image 177. Codominant left vertebral artery remains patent with mild additional V3 segment calcified plaque, no stenosis to the skull base. CTA HEAD Posterior circulation: Tortuous distal vertebral arteries and vertebrobasilar junction with abundant calcified plaque, especially in the left V4 segment. Mild-to-moderate left V4 stenosis. Patent PICA origins. No significant right V4 stenosis. Mild vertebrobasilar junction stenosis. Tortuous and heavily calcified basilar artery (series 6, image 124) with no significant basilar stenosis. Patent SCA and PCA origins with calcified plaque. No high-grade origin stenosis. Posterior communicating arteries are diminutive or absent. Tandem severe stenoses of the left PCA P1 and P2 segments (series 11, image 23). Similar severe stenosis at the right PCA P1/P2 junction. Anterior circulation: Both ICA siphons are patent and tortuous. Mild siphon plaque in the petrous segment. Widespread and moderate calcified plaque beginning in the cavernous segment. Moderate distal supraclinoid left ICA stenosis (series 8, image 112). Similar right siphon plaque and moderate distal right ICA supraclinoid stenosis (series 6, image 109). Patent carotid termini, MCA and ACA origins. No high-grade origin stenosis. Diminutive or absent anterior communicating artery. Bilateral ACA branches are patent with mild irregularity. Left MCA M1 segment and bifurcation are patent. Right MCA mild proximal right M1 stenosis (series 2, image 20). Right M1 and right MCA bifurcation remain patent. Mild to moderate bilateral MCA M2 and distal branch irregularity. No complete branch occlusion is identified.  Venous sinuses: Early contrast timing, not well evaluated. Anatomic variants: None. Review of the MIP images confirms the above findings IMPRESSION: 1. CTA is negative for large vessel occlusion, but Positive for Extensive Atherosclerosis throughout the head and neck. 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 - Moderate stenosis both distal supraclinoid ICAs. - Moderate to Severe  stenosis bilateral PCA P1 and P2 segments. 2. CT Perfusion affected by artifact. Questionable oligemia in the anterior Left MCA division. 3.  Aortic Atherosclerosis (ICD10-I70.0). 4. 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 Electronically Signed   By: VEAR Hurst M.D.   On: 11/29/2023 04:11   CT HEAD CODE STROKE WO CONTRAST` Result Date: 11/29/2023 CLINICAL DATA:  Code stroke. Initial evaluation for acute neuro deficit, slurred speech, altered mental status. EXAM: CT HEAD WITHOUT CONTRAST TECHNIQUE: Contiguous axial images were obtained from the base of the skull through the vertex without intravenous contrast. RADIATION DOSE REDUCTION: This exam was performed according to the departmental dose-optimization program which includes automated exposure control, adjustment of the mA and/or kV according to patient size and/or use of iterative reconstruction technique. COMPARISON:  Prior study from 06/14/2022 FINDINGS: Brain: Generalized age-related cerebral atrophy. Patchy and confluent hypodensity involving the supratentorial cerebral white matter, consistent with chronic small vessel ischemic disease, advanced in nature. Multiple scattered remote infarcts involving the left frontal lobe, left occipital lobe, and right basal ganglia/thalamus. No acute intracranial hemorrhage. No acute large vessel territory infarct. No mass lesion or midline shift. Mild ventricular prominence related global parenchymal volume loss of hydrocephalus. No  convincing extra-axial fluid collection. Vascular: No abnormal hyperdense vessel. Extensive calcified atherosclerosis present at the skull base. Skull: Scalp soft tissues demonstrate no acute finding. Appear intact. Sinuses/Orbits: Globes orbital soft tissues within normal limits. Mild mucosal thickening present about the maxillary sinuses. Paranasal sinuses are otherwise clear. Trace right mastoid effusion noted, of doubtful significance. Other: None. ASPECTS [facility] Stroke Program Early CT Score) - Ganglionic level infarction (caudate, lentiform nuclei, internal capsule, insula, M1-M3 cortex): 7 - Supraganglionic infarction (M4-M6 cortex): 3 Total score (0-10 with 10 being normal): 10 IMPRESSION: 1. No acute intracranial abnormality. 2. Aspects is 10. 3. 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 Results were called by telephone at the time of interpretation on 11/29/2023 at 3:33 am to provider VICENTA ABLE , who verbally acknowledged these results. Electronically Signed   By: Morene Hoard M.D.   On: 11/29/2023 03:33     Subjective: Pt says he feels well, he says he is worried about adding aspirin  to his apixaban  regimen about bleeding, agreeable to going to SNF for rehab.   Discharge Exam: Vitals:   11/29/23 1959 11/30/23 0535  BP: (!) 143/93 130/73  Pulse: 60 61  Resp: 18 18  Temp: 97.9 F (36.6 C) 98.3 F (36.8 C)  SpO2: 98% 98%   Vitals:   11/29/23 1816 11/29/23 1944 11/29/23 1959 11/30/23 0535  BP: 107/70 120/61 (!) 143/93 130/73  Pulse: 71 73 60 61  Resp: 20 20 18 18   Temp: 98.3 F (36.8 C) (!) 97.5 F (36.4 C) 97.9 F (36.6 C) 98.3 F (36.8 C)  TempSrc: Oral Oral Oral   SpO2: 96% 98% 98% 98%  Weight:        General: Pt is alert, awake, not in acute distress Cardiovascular: RRR, S1/S2 +, no rubs, no gallops Respiratory: CTA bilaterally, no wheezing, no rhonchi Abdominal: Soft, NT, ND, bowel sounds  + Extremities: no edema, no cyanosis Neuro: nonfocal exam.    The results of significant diagnostics from this hospitalization (including imaging, microbiology, ancillary and laboratory) are listed below for reference.     Microbiology: No results found for this or any previous visit (from the past 240 hours).   Labs: BNP (last 3 results) No results  for input(s): BNP in the last 8760 hours. Basic Metabolic Panel: Recent Labs  Lab 11/29/23 0307 11/30/23 0336  NA 139 140  K 3.9 3.5  CL 104 107  CO2 25 26  GLUCOSE 135* 144*  BUN 25* 19  CREATININE 1.19 1.00  CALCIUM  9.3 8.9   Liver Function Tests: Recent Labs  Lab 11/29/23 0307  AST 26  ALT 15  ALKPHOS 46  BILITOT 1.3*  PROT 6.3*  ALBUMIN 3.7   No results for input(s): LIPASE, AMYLASE in the last 168 hours. No results for input(s): AMMONIA in the last 168 hours. CBC: Recent Labs  Lab 11/29/23 0307 11/30/23 0336  WBC 5.6 4.9  NEUTROABS 3.5  --   HGB 14.1 13.2  HCT 42.4 39.3  MCV 94.0 94.5  PLT 156 128*   Cardiac Enzymes: No results for input(s): CKTOTAL, CKMB, CKMBINDEX, TROPONINI in the last 168 hours. BNP: Invalid input(s): POCBNP CBG: Recent Labs  Lab 11/29/23 0714 11/29/23 1125 11/29/23 1614 11/29/23 2122 11/30/23 0829  GLUCAP 134* 150* 197* 162* 133*   D-Dimer No results for input(s): DDIMER in the last 72 hours. Hgb A1c Recent Labs    11/29/23 0307  HGBA1C 7.6*   Lipid Profile Recent Labs    11/29/23 0307  CHOL 128  HDL 44  LDLCALC 74  TRIG 52  CHOLHDL 2.9   Thyroid  function studies No results for input(s): TSH, T4TOTAL, T3FREE, THYROIDAB in the last 72 hours.  Invalid input(s): FREET3 Anemia work up No results for input(s): VITAMINB12, FOLATE, FERRITIN, TIBC, IRON, RETICCTPCT in the last 72 hours. Urinalysis    Component Value Date/Time   COLORURINE YELLOW 08/17/2017 2255   APPEARANCEUR CLEAR 08/17/2017 2255   LABSPEC 1.020  08/17/2017 2255   PHURINE 6.0 08/17/2017 2255   GLUCOSEU 50 (A) 08/17/2017 2255   HGBUR SMALL (A) 08/17/2017 2255   BILIRUBINUR NEGATIVE 08/17/2017 2255   KETONESUR NEGATIVE 08/17/2017 2255   PROTEINUR 30 (A) 08/17/2017 2255   UROBILINOGEN 0.2 03/13/2014 2240   NITRITE NEGATIVE 08/17/2017 2255   LEUKOCYTESUR NEGATIVE 08/17/2017 2255   Sepsis Labs Recent Labs  Lab 11/29/23 0307 11/30/23 0336  WBC 5.6 4.9   Microbiology No results found for this or any previous visit (from the past 240 hours).  Time coordinating discharge: 32 mins   SIGNED:  Afton Louder, MD  [facility] 11/30/2023, 11:14 AM How to contact the [facility] Attending or Consulting provider 7A - 7P or covering provider during after hours 7P -7A, for this patient?  Check the care team in [facility] and look for a) attending/consulting TRH provider listed and b) the TRH team listed Log into [email or web address] and use [facility] universal password to access. If you do not have the password, please contact the hospital operator. Locate the TRH provider you are looking for under [facility] and page to a number that you can be directly reached. If you still have difficulty reaching the provider, please page the [facility] (Director on Call) for the Hospitalists listed on amion for assistance.

## 2023-11-30 NOTE — TOC Transition Note (Signed)
 Transition of Care [facility]) - Discharge Note   Patient Details  Name: [name] MRN: [id number] Date of Birth: 04-21-1940  Transition of Care [facility]) CM/SW Contact:  Nena LITTIE Coffee, RN Phone Number: 11/30/2023, 12:32 PM   Clinical Narrative:    Shara approved. PNC notified. Pt will go to room 102-P, report to [phone] or [phone]. Staff updated, Pt and daughter updated. Staff to transport to [facility].    Barriers to Discharge: Insurance Authorization   Patient Goals and CMS Choice Patient states their goals for this hospitalization and ongoing recovery are:: short term rehab   Choice offered to / list presented to : Patient, Adult Children  ownership interest in [facility].provided to:: Patient    Discharge Placement                       Discharge Plan and Services Additional resources added to the After Visit Summary for   In-house Referral: Clinical Social Work   Post Acute Care Choice: Skilled Nursing Facility                               Social Drivers of Health (SDOH) Interventions SDOH Screenings   Food Insecurity: No Food Insecurity (11/29/2023)  Housing: Low Risk  (11/29/2023)  Transportation Needs: No Transportation Needs (11/29/2023)  Utilities: Not At Risk (11/29/2023)  Social Connections: Socially Integrated (11/29/2023)  Tobacco Use: Low Risk  (11/29/2023)     Readmission Risk Interventions     No data to display

## 2023-12-03 ENCOUNTER — Encounter: Payer: Self-pay | Admitting: Adult Health

## 2023-12-03 ENCOUNTER — Non-Acute Institutional Stay (SKILLED_NURSING_FACILITY): Payer: Self-pay | Admitting: Adult Health

## 2023-12-03 DIAGNOSIS — I700 Atherosclerosis of aorta: Secondary | ICD-10-CM | POA: Diagnosis not present

## 2023-12-03 DIAGNOSIS — E785 Hyperlipidemia, unspecified: Secondary | ICD-10-CM

## 2023-12-03 DIAGNOSIS — F339 Major depressive disorder, recurrent, unspecified: Secondary | ICD-10-CM | POA: Insufficient documentation

## 2023-12-03 DIAGNOSIS — G459 Transient cerebral ischemic attack, unspecified: Secondary | ICD-10-CM | POA: Diagnosis not present

## 2023-12-03 DIAGNOSIS — E1159 Type 2 diabetes mellitus with other circulatory complications: Secondary | ICD-10-CM | POA: Diagnosis not present

## 2023-12-03 DIAGNOSIS — F0390 Unspecified dementia without behavioral disturbance: Secondary | ICD-10-CM | POA: Insufficient documentation

## 2023-12-03 DIAGNOSIS — E1169 Type 2 diabetes mellitus with other specified complication: Secondary | ICD-10-CM | POA: Insufficient documentation

## 2023-12-03 DIAGNOSIS — N3281 Overactive bladder: Secondary | ICD-10-CM | POA: Insufficient documentation

## 2023-12-03 DIAGNOSIS — I480 Paroxysmal atrial fibrillation: Secondary | ICD-10-CM | POA: Diagnosis not present

## 2023-12-03 DIAGNOSIS — I152 Hypertension secondary to endocrine disorders: Secondary | ICD-10-CM

## 2023-12-03 DIAGNOSIS — E1165 Type 2 diabetes mellitus with hyperglycemia: Secondary | ICD-10-CM | POA: Diagnosis not present

## 2023-12-03 DIAGNOSIS — Z794 Long term (current) use of insulin: Secondary | ICD-10-CM

## 2023-12-03 NOTE — Progress Notes (Signed)
 Location:  [facility] Room Number: 102 Place of Service:  SNF (31)   CODE STATUS: DNR  No Known Allergies  Chief Complaint  Patient presents with   Hospitalization Follow-up    HPI:  He is a 83 year old man who has been hospitalized from 11-29-23 through 11-30-23. His past medical history includes: atrial fibrillation; cva; diabetes mellitus; depression; gerd hyperlipidemia. He presented to the ED with difficulty with speech. He was treated for TIA with his scans did not demonstrate acute findings. He is here for short term rehab with his goal to return back home. He will continue to be followed for his chronic illnesses including:   PAF (Paroxsymal atrial fibrillation) Type 2 diabetes mellitus with hyperglycemia with long term current use of insulin :   Hyperlipidemia associated with type 2 diabetes mellitus  Past Medical History:  Diagnosis Date   Atrial fibrillation (HCC)    Cholelithiasis 10/2011   Per CT. No cholecystitis radiographically.   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   Shortness of breath    exertion   Sleep apnea    cpap   Stroke [facility]) 2010/2013   Right Brain stroke    Past Surgical History:  Procedure Laterality Date   CARDIOVERSION  07/12/2011   Procedure: CARDIOVERSION;  Surgeon: Vinie KYM Maxcy, MD;  Location: [facility] ENDOSCOPY;  Service: Cardiovascular;  Laterality: N/A;   CAROTID DOPPLER STUDY  01/31/2012   no significant extracranial carotid artery stenosis   CATARACT EXTRACTION W/PHACO Left 01/30/2019   Procedure: CATARACT EXTRACTION PHACO AND INTRAOCULAR LENS PLACEMENT (IOC) (CDE: 4.76);  Surgeon: Harrie Agent, MD;  Location: [facility] ORS;  Service: Ophthalmology;  Laterality: Left;    COLONOSCOPY     DOPPLER ECHOCARDIOGRAPHY  01/31/2012   EF 55-60%, moderately calcified annulus of the mitral valve, left atrium demonstrated mild-moderately dilated   KNEE SURGERY     TEE WITHOUT CARDIOVERSION  07/12/2011   Procedure: TRANSESOPHAGEAL ECHOCARDIOGRAM (TEE);  Surgeon: Vinie KYM Maxcy, MD;  Location: [facility] ENDOSCOPY;  Service: Cardiovascular;  Laterality: N/A;  to be done at 1330   TONSILLECTOMY      Social History   Socioeconomic History   Marital status: Married    Spouse name: Dorothy    Number of children: 2   Years of education: 12+   Highest education level: Not on file  Occupational History    Employer: RETIRED  Tobacco Use   Smoking status: Never   Smokeless tobacco: Never  Vaping Use   Vaping status: Never Used  Substance and Sexual Activity   Alcohol  use: Yes    Alcohol /week: 2.0 standard drinks of alcohol     Types: 2 Glasses of wine per week    Comment: occasionally    Drug use: No   Sexual activity: Never  Other Topics Concern   Not on file  Social History Narrative   Patient lives at home wife Naomie. .    Patient has 2 children.    Patient is right handed.    Patient is retired.    Patient has some college.          Social Drivers of Corporate investment banker Strain: Not on [employer]  Insecurity: No Food Insecurity (11/29/2023)   Hunger Vital Sign    Worried About Running Out of Food in the Last Year: Never true    Ran Out of Food in the Last Year: Never true  Transportation Needs: No Transportation Needs (11/29/2023)   PRAPARE - Administrator, Civil Service (Medical): No    Lack of Transportation (Non-Medical): No  Physical Activity: Not on file  Stress: Not on file  Social Connections: Socially Integrated (11/29/2023)   Social Connection and Isolation Panel    Frequency of Communication with Friends and Family: Twice a week    Frequency of Social Gatherings with Friends and Family: Twice a week    Attends Religious  Services: 1 to 4 times per year    Active Member of [employer] or Organizations: No    Attends Engineer, structural: 1 to 4 times per year    Marital Status: Married  Catering manager Violence: Not At Risk (11/29/2023)   Humiliation, Afraid, Rape, and Kick questionnaire    Fear of Current or Ex-Partner: No    Emotionally Abused: No    Physically Abused: No    Sexually Abused: No   Family History  Problem Relation Age of Onset   Diabetes Mother    Diabetes Son    Pancreatic cancer Other        GRANDFATHER   Colon cancer Other        INTESTINAL, GRANDMOTHER      VITAL SIGNS BP (!) 161/68   Pulse 87   Temp 97.7 F (36.5 C)   Resp 20   Ht 5' 4 (1.626 m)   Wt 179 lb 3.2 oz (81.3 kg)   SpO2 98%   BMI 30.76 kg/m   Outpatient Encounter Medications as of 12/03/2023  Medication Sig Note   apixaban  (ELIQUIS ) 5 MG TABS tablet Take 1 tablet (5 mg total) by mouth 2 (two) times daily. 11/29/2023: Pt. Remembers taking this medication 11/28/2023 in the morning but does not know the time   atorvastatin  (LIPITOR) 40 MG tablet Take 1 tablet (40 mg total) by mouth every evening.    carvedilol  (COREG ) 25 MG tablet Take 25 mg by mouth 2 (two) times daily with a meal.    Choline Fenofibrate  (FENOFIBRIC ACID ) 135 MG CPDR Take 135 mg by mouth daily.     Continuous Glucose Sensor (FREESTYLE LIBRE 3 PLUS SENSOR) MISC Change sensor every 15 days.    escitalopram  (LEXAPRO ) 10 MG tablet Take 10 mg by mouth daily.    hydrocortisone  (ANUSOL -HC) 2.5 % rectal cream Apply 1 Application topically 4 (four) times daily as needed for hemorrhoids or anal itching.    LANTUS  SOLOSTAR 100 UNIT/ML Solostar Pen Inject 7 Units into the skin daily.    linagliptin  (TRADJENTA ) 5 MG TABS tablet Take 1 tablet (5 mg total) by mouth daily.    losartan -hydrochlorothiazide  (HYZAAR) 100-25 MG tablet Take 1 tablet by mouth daily.    mirabegron  ER (MYRBETRIQ ) 50 MG TB24 tablet Take 50 mg by mouth daily.    ONETOUCH VERIO test  strip SMARTSIG:1 Strip(s) Via Meter 5 Times Daily    No facility-administered encounter medications on file as of 12/03/2023.     SIGNIFICANT DIAGNOSTIC EXAMS  LABS   11-29-23: wbc 5.6; hgb 14.1; hct 42.4; mcv 94.0 plt 156; glucose 135; bun 25; creat 1.19; k+ 3.9; na++ 139; ca 9.3 gfr >60; protein 6.3; albumin 3.7; total bili 1.3; chol 128; ldl 74; trig 52; hdl 44; hgb  A1c 7.6   Review of Systems  Constitutional:  Negative for malaise/fatigue.  Respiratory:  Negative for cough and shortness of breath.   Cardiovascular:  Negative for chest pain, palpitations and leg swelling.  Gastrointestinal:  Negative for abdominal pain, constipation and heartburn.  Musculoskeletal:  Negative for back pain, joint pain and myalgias.  Skin: Negative.   Neurological:  Negative for dizziness.  Psychiatric/Behavioral:  The patient is not nervous/anxious.      Physical Exam Constitutional:      General: He is not in acute distress.    Appearance: He is well-developed. He is not diaphoretic.  Neck:     Thyroid : No thyromegaly.  Cardiovascular:     Rate and Rhythm: Normal rate. Rhythm irregular.     Pulses: Normal pulses.     Heart sounds: Normal heart sounds.  Pulmonary:     Effort: Pulmonary effort is normal. No respiratory distress.     Breath sounds: Normal breath sounds.  Abdominal:     General: Bowel sounds are normal. There is no distension.     Palpations: Abdomen is soft.     Tenderness: There is no abdominal tenderness.  Musculoskeletal:        General: Normal range of motion.     Cervical back: Neck supple.     Right lower leg: No edema.     Left lower leg: No edema.  Lymphadenopathy:     Cervical: No cervical adenopathy.  Skin:    General: Skin is warm and dry.  Neurological:     Mental Status: He is alert. Mental status is at baseline.  Psychiatric:        Mood and Affect: Mood normal.       ASSESSMENT/ PLAN:  TODAY  TIA (transient ischemic attack)/history of cva:  11-30-23: MRI: No evidence of acute infarct within limits of motion artifact.2. 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 3. Severe chronic small vessel disease.  2. PAF (Paroxsymal atrial fibrillation) will continue coreg  25 mg twice daily for rate control; eliquis  5 mg twice daily   3. Type 2 diabetes mellitus with hyperglycemia with long term current use of insulin : hgb A1c 7.6; will continue tradjenta  5 mg daily; lantus  7 units nightly   4. Hyperlipidemia associated with type 2 diabetes mellitus: ldl 74 will continue lipitor 40 mg daily; fenofibrate  135 mg daily   5. Hypertension associated with type 2 diabetes mellitus: b/p 161/68: will continue coreg  25 mg twice daily and hyzaar 100/25 mg daily  6. Major depression recurrent chronic: will continue lexapro  10 mg daily  7. OAB (overactive bladder) will continue myrbetriq  50 mg daily   8. Aortic atherosclerosis (ct 03-13-14) is on statin and asa  9. Major neurocognitive disorder: Severe chronic small vessel disease.   Barnie Seip NP [facility]   call [phone]

## 2023-12-04 ENCOUNTER — Non-Acute Institutional Stay (SKILLED_NURSING_FACILITY): Payer: Self-pay | Admitting: Internal Medicine

## 2023-12-04 ENCOUNTER — Encounter: Payer: Self-pay | Admitting: Internal Medicine

## 2023-12-04 DIAGNOSIS — G459 Transient cerebral ischemic attack, unspecified: Secondary | ICD-10-CM | POA: Diagnosis not present

## 2023-12-04 DIAGNOSIS — E782 Mixed hyperlipidemia: Secondary | ICD-10-CM

## 2023-12-04 DIAGNOSIS — F0390 Unspecified dementia without behavioral disturbance: Secondary | ICD-10-CM | POA: Diagnosis not present

## 2023-12-04 DIAGNOSIS — I4819 Other persistent atrial fibrillation: Secondary | ICD-10-CM

## 2023-12-04 DIAGNOSIS — E1351 Other specified diabetes mellitus with diabetic peripheral angiopathy without gangrene: Secondary | ICD-10-CM | POA: Insufficient documentation

## 2023-12-04 NOTE — Progress Notes (Signed)
 [name]   NURSING HOME LOCATION:  [facility] ROOM NUMBER:  102  CODE STATUS:  DNR  PCP:  Garnette Ore MD  This is a comprehensive admission note to this SNFperformed on this date less than 30 days from date of admission. Included are preadmission medical/surgical history; reconciled medication list; family history; social history and comprehensive review of systems.  Corrections and additions to the records were documented. Comprehensive physical exam was also performed. Additionally a clinical summary was entered for each active diagnosis pertinent to this admission in the Problem List to enhance continuity of care.  HPI: He was hospitalized 8/29-8/30/2025 having fallen out of bed early in the morning of admission with associated dysarthria.  In the ED CTA  imaging revealed no acute findings.  Teleneurologist consulted and recommended admission for stroke workup.  Stroke swallow screen was completed successfully in the ED.  EKG revealed atrial fibrillation with well-controlled rate. MRI also revealed no acute findings.   Although CNS imaging revealed no acute change there were significant findings.  CTA was negative for large vessel occlusion but positive for extensive atherosclerosis throughout the head and neck with notable stenoses.  There was severe right, moderate to severe left vertebral artery origin stenosis; moderate stenosis of both distal supraclinoid ICAs; and moderate-severe stenosis bilateral PCA P1 and P2 segments.  MRI revealed unchanged old infarcts in the left frontal lobe, left superior occipital lobe and right corona radiata.  There was a background of severe chronic small vessel disease present. Neurology initiated 81 mg of aspirin  prophylaxis.  The patient did not wish to take aspirin  but was agreeable to continuing apixaban .  SLP evaluated him and felt that he was back in his baseline. Glucoses ranged from 129 up to 150; A1c was 7.6%.  This was felt to be  adequate control based on advanced age and comorbidities. Mild prerenal azotemia was initially present with a BUN of 25; final value was 19.  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  Albumin was normal at 3.7 but total protein was decreased to 6.3.  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  LDL was slightly above goal at 74.  He is on 40 mg of atorvastatin . PT/OT recommended SNF placement for rehab.  Past medical and surgical history: Includes diabetes with vascular complications; AF; essential hypertension; history of cholelithiasis; history of CVAs; history of depression; GERD; history of sleep apnea; and dyslipidemia. Surgeries and procedures include history of cardioversion; colonoscopy; and knee surgery.  Family history: reviewed, non contributory due to advanced age.  Social history: Occasional alcohol  intake reported.  Non-smoker.   Review of systems: Clinical neurocognitive deficits made validity of responses questionable , compromising ROS completion.  He can provide no history.  When asked the reason for his hospitalization he stated fall.  He denied any other findings.  His only complaint is a little sleepy.  Physical exam:  Pertinent or positive findings: He was initially asleep in the wheelchair with his head slumped forward.  Despite the history of OSA; he exhibited only hypopnea without snoring or apnea.  He was difficult to arouse and remained very lethargic even after awake.  His speech pattern was gravelly and dysarthric.  For most part it was indiscernible.  Hair is thinner over the crown.  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  Bilateral ptosis is present.  Initially he could not follow commands such as a request to  open his mouth or exam.  Breath sounds are decreased.  Heart sounds are distant and rhythm irregular.  Abdomen slightly protuberant.  Pedal pulses are decreased.  There is trace edema at  the sock line.  General appearance: Adequately nourished; no acute distress, increased work of breathing is present.   Lymphatic: No lymphadenopathy about the head, neck, axilla. Eyes: No conjunctival inflammation or lid edema is present. There is no scleral icterus. Ears:  External ear exam shows no significant lesions or deformities.   Nose:  External nasal examination shows no deformity or inflammation. Nasal mucosa are pink and moist without lesions, exudates Oral exam: Lips and gums are healthy appearing. Neck:  No thyromegaly, masses, tenderness noted.    Heart:  No gallop, murmur, click, rub.  Lungs: without wheezes, rhonchi, rales, rubs. Abdomen: Bowel sounds are normal.  Abdomen is soft and nontender with no organomegaly, hernias, masses. GU: Deferred  Extremities:  No cyanosis, clubbing. Neurologic exam:  Balance, Rhomberg, finger to nose testing could not be completed due to clinical state Skin: Warm & dry w/o tenting. No visible significant lesions or rash.  See clinical summary under each active problem in the Problem List with associated updated therapeutic plan

## 2023-12-04 NOTE — Assessment & Plan Note (Signed)
 PT/OT at SNF as tolerated.  He will discuss addition of low-dose aspirin  to apixaban  with his PCP, Dr. Nichole.

## 2023-12-04 NOTE — Patient Instructions (Signed)
 See assessment and plan under each diagnosis in the problem list and acutely for this visit

## 2023-12-04 NOTE — Assessment & Plan Note (Signed)
 Confirmatory MMSE will be completed at the SNF with SLUMS testing.

## 2023-12-04 NOTE — Assessment & Plan Note (Addendum)
 8/29 - 11/30/2023 glucoses while hospitalized with TIA presentation ranged from 129 up to 150.  A1c 7.6% BP adequate control in view of his advanced age and extensive comorbidities.  His PCP is an Actor.

## 2023-12-04 NOTE — Assessment & Plan Note (Signed)
 11/29/2023 EKG revealed atrial fibrillation with well-controlled ventricular rate.  Continue apixaban .

## 2023-12-04 NOTE — Assessment & Plan Note (Signed)
 Current LDL is 74 on 40 mg of atorvastatin .  Consideration could be given to using high-dose rosuvastatin  to obtain a goal of less than 70 and ideally less than 50.

## 2023-12-06 ENCOUNTER — Non-Acute Institutional Stay (SKILLED_NURSING_FACILITY): Payer: Self-pay | Admitting: Adult Health

## 2023-12-06 ENCOUNTER — Encounter: Payer: Self-pay | Admitting: Adult Health

## 2023-12-06 DIAGNOSIS — E1165 Type 2 diabetes mellitus with hyperglycemia: Secondary | ICD-10-CM

## 2023-12-06 DIAGNOSIS — Z794 Long term (current) use of insulin: Secondary | ICD-10-CM | POA: Diagnosis not present

## 2023-12-06 NOTE — Progress Notes (Signed)
 Location:  [facility] Room Number: 103 Place of Service:  SNF (31)   CODE STATUS: DNR  No Known Allergies  Chief Complaint  Patient presents with   Diabetes    HPI:  Her cbg readings remain elevated in the 200-300 range. He is taking lantus  7 units twice daily and tradjenta  5 mg daily. There are no reports of excessive hunger thirst or urination.   Past Medical History:  Diagnosis Date   Atrial fibrillation (HCC)    Cholelithiasis 10/2011   Per CT. No cholecystitis radiographically.   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   Shortness of breath    exertion   Sleep apnea    cpap   Stroke [facility]) 2010/2013   Right Brain stroke    Past Surgical History:  Procedure Laterality Date   CARDIOVERSION  07/12/2011   Procedure: CARDIOVERSION;  Surgeon: Vinie KYM Maxcy, MD;  Location: [facility] ENDOSCOPY;  Service: Cardiovascular;  Laterality: N/A;   CAROTID DOPPLER STUDY  01/31/2012   no significant extracranial carotid artery stenosis   CATARACT EXTRACTION W/PHACO Left 01/30/2019   Procedure: CATARACT EXTRACTION PHACO AND INTRAOCULAR LENS PLACEMENT (IOC) (CDE: 4.76);  Surgeon: Harrie Agent, MD;  Location: [facility] ORS;  Service: Ophthalmology;  Laterality: Left;   COLONOSCOPY     DOPPLER ECHOCARDIOGRAPHY  01/31/2012   EF 55-60%, moderately calcified annulus of the mitral valve, left atrium demonstrated mild-moderately dilated   KNEE SURGERY     TEE WITHOUT CARDIOVERSION  07/12/2011   Procedure: TRANSESOPHAGEAL ECHOCARDIOGRAM (TEE);  Surgeon: Vinie KYM Maxcy, MD;  Location: [facility] ENDOSCOPY;  Service: Cardiovascular;  Laterality: N/A;  to be done at 1330   TONSILLECTOMY      Social History   Socioeconomic History    Marital status: Married    Spouse name: Dorothy    Number of children: 2   Years of education: 12+   Highest education level: Not on file  Occupational History    Employer: RETIRED  Tobacco Use   Smoking status: Never   Smokeless tobacco: Never  Vaping Use   Vaping status: Never Used  Substance and Sexual Activity   Alcohol  use: Yes    Alcohol /week: 2.0 standard drinks of alcohol     Types: 2 Glasses of wine per week    Comment: occasionally    Drug use: No   Sexual activity: Never  Other Topics Concern   Not on file  Social History Narrative   Patient lives at home wife Naomie. .    Patient has 2 children.    Patient is right handed.    Patient is retired.    Patient has some college.          Social Drivers of Corporate investment banker Strain: Not on file  Food Insecurity: No Food Insecurity (11/29/2023)   Hunger Vital Sign    Worried About Running Out of Food in the Last Year: Never true    Ran Out of Food in the Last Year: Never true  Transportation Needs: No Transportation Needs (11/29/2023)   PRAPARE - Administrator, Civil Service (Medical): No    Lack of Transportation (Non-Medical): No  Physical Activity: Not on  file  Stress: Not on file  Social Connections: Socially Integrated (11/29/2023)   Social Connection and Isolation Panel    Frequency of Communication with Friends and Family: Twice a week    Frequency of Social Gatherings with Friends and Family: Twice a week    Attends Religious Services: 1 to 4 times per year    Active Member of [employer] or Organizations: No    Attends Engineer, structural: 1 to 4 times per year    Marital Status: Married  Catering manager Violence: Not At Risk (11/29/2023)   Humiliation, Afraid, Rape, and Kick questionnaire    Fear of Current or Ex-Partner: No    Emotionally Abused: No    Physically Abused: No    Sexually Abused: No   Family History  Problem Relation Age of Onset   Diabetes Mother     Diabetes Son    Pancreatic cancer Other        GRANDFATHER   Colon cancer Other        INTESTINAL, GRANDMOTHER      VITAL SIGNS BP (!) 100/53   Pulse 64   Temp 97.7 F (36.5 C)   Resp 20   Ht 5' 4 (1.626 m)   Wt 177 lb 12.8 oz (80.6 kg)   SpO2 98%   BMI 30.52 kg/m   Outpatient Encounter Medications as of 12/06/2023  Medication Sig Note   apixaban  (ELIQUIS ) 5 MG TABS tablet Take 1 tablet (5 mg total) by mouth 2 (two) times daily. 11/29/2023: Pt. Remembers taking this medication 11/28/2023 in the morning but does not know the time   atorvastatin  (LIPITOR) 40 MG tablet Take 1 tablet (40 mg total) by mouth every evening.    carvedilol  (COREG ) 25 MG tablet Take 25 mg by mouth 2 (two) times daily with a meal.    Choline Fenofibrate  (FENOFIBRIC ACID ) 135 MG CPDR Take 135 mg by mouth daily.     Continuous Glucose Sensor (FREESTYLE LIBRE 3 PLUS SENSOR) MISC Change sensor every 15 days.    escitalopram  (LEXAPRO ) 10 MG tablet Take 10 mg by mouth daily.    hydrocortisone  (ANUSOL -HC) 2.5 % rectal cream Apply 1 Application topically 4 (four) times daily as needed for hemorrhoids or anal itching.    Insulin  Glargine Solostar (LANTUS ) 100 UNIT/ML Solostar Pen Inject 12 Units into the skin 2 (two) times daily. Special Instructions: rotate sites - DO NOT HOLD WITHOUT PROVIDER NOTIFICATION  08:00 AM, 08:00 PM    Insulin  lispro (HUMALOG  JUNIOR KWIKPEN) 100 UNIT/ML Inject 10 Units into the skin as directed.  08:00 AM, 12:00 PM, 06:00 PM    linagliptin  (TRADJENTA ) 5 MG TABS tablet Take 1 tablet (5 mg total) by mouth daily.    losartan -hydrochlorothiazide  (HYZAAR) 100-25 MG tablet Take 1 tablet by mouth daily.    mirabegron  ER (MYRBETRIQ ) 50 MG TB24 tablet Take 50 mg by mouth daily.    ONETOUCH VERIO test strip SMARTSIG:1 Strip(s) Via Meter 5 Times Daily    LANTUS  SOLOSTAR 100 UNIT/ML Solostar Pen Inject 7 Units into the skin daily. (Patient not taking: Reported on 12/10/2023)    No  facility-administered encounter medications on file as of 12/06/2023.     SIGNIFICANT DIAGNOSTIC EXAMS  LABS   11-29-23: wbc 5.6; hgb 14.1; hct 42.4; mcv 94.0 plt 156; glucose 135; bun 25; creat 1.19; k+ 3.9; na++ 139; ca 9.3 gfr >60; protein 6.3; albumin 3.7; total bili 1.3; chol 128; ldl 74; trig 52; hdl 44; hgb A1c 7.6  Review of Systems  Constitutional:  Negative for malaise/fatigue.  Respiratory:  Negative for cough and shortness of breath.   Cardiovascular:  Negative for chest pain, palpitations and leg swelling.  Gastrointestinal:  Negative for abdominal pain, constipation and heartburn.  Musculoskeletal:  Negative for back pain, joint pain and myalgias.  Skin: Negative.   Neurological:  Negative for dizziness.  Psychiatric/Behavioral:  The patient is not nervous/anxious.     Physical Exam Constitutional:      General: He is not in acute distress.    Appearance: He is well-developed. He is not diaphoretic.  Neck:     Thyroid : No thyromegaly.  Cardiovascular:     Rate and Rhythm: Normal rate and regular rhythm.     Heart sounds: Normal heart sounds.  Pulmonary:     Effort: Pulmonary effort is normal. No respiratory distress.     Breath sounds: Normal breath sounds.  Abdominal:     General: Bowel sounds are normal. There is no distension.     Palpations: Abdomen is soft.     Tenderness: There is no abdominal tenderness.  Musculoskeletal:        General: Normal range of motion.     Cervical back: Neck supple.     Right lower leg: No edema.     Left lower leg: No edema.  Lymphadenopathy:     Cervical: No cervical adenopathy.  Skin:    General: Skin is warm and dry.  Neurological:     Mental Status: He is alert. Mental status is at baseline.  Psychiatric:        Mood and Affect: Mood normal.      ASSESSMENT/ PLAN:  TODAY  Type 2 diabetes mellitus with hyperglycemia with long term current use of insulin : hgb A1c is 7.6 will continue tradjenta  5 mg daily; will  increase lantus  to 10 units twice daily and will begin humalog  8 units with meals. And will monitor his response    Barnie Seip NP [facility] Adult Medicine   call [phone]

## 2023-12-10 ENCOUNTER — Encounter: Payer: Self-pay | Admitting: Adult Health

## 2023-12-10 ENCOUNTER — Non-Acute Institutional Stay (SKILLED_NURSING_FACILITY): Payer: Self-pay | Admitting: Adult Health

## 2023-12-10 DIAGNOSIS — E1351 Other specified diabetes mellitus with diabetic peripheral angiopathy without gangrene: Secondary | ICD-10-CM | POA: Diagnosis not present

## 2023-12-10 NOTE — Progress Notes (Unsigned)
 Location:  [facility] Room Number: 102 P Place of Service:  SNF (31)   CODE STATUS: DNR  No Known Allergies  Chief Complaint  Patient presents with   Diabetes    Diabetic follow-up     HPI:  He is presently taking lantus  10 units twice daily and humalog  8 units with meals and tradjenta  5 mg daily. His cbg readings are over 200. There are no reports of excessive hunger thirst or urination.   Past Medical History:  Diagnosis Date   Atrial fibrillation (HCC)    Cholelithiasis 10/2011   Per CT. No cholecystitis radiographically.   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   Shortness of breath    exertion   Sleep apnea    cpap   Stroke [facility]) 2010/2013   Right Brain stroke    Past Surgical History:  Procedure Laterality Date   CARDIOVERSION  07/12/2011   Procedure: CARDIOVERSION;  Surgeon: Vinie KYM Maxcy, MD;  Location: [facility] ENDOSCOPY;  Service: Cardiovascular;  Laterality: N/A;   CAROTID DOPPLER STUDY  01/31/2012   no significant extracranial carotid artery stenosis   CATARACT EXTRACTION W/PHACO Left 01/30/2019   Procedure: CATARACT EXTRACTION PHACO AND INTRAOCULAR LENS PLACEMENT (IOC) (CDE: 4.76);  Surgeon: Harrie Agent, MD;  Location: [facility] ORS;  Service: Ophthalmology;  Laterality: Left;   COLONOSCOPY     DOPPLER ECHOCARDIOGRAPHY  01/31/2012   EF 55-60%, moderately calcified annulus of the mitral valve, left atrium demonstrated mild-moderately dilated   KNEE SURGERY     TEE WITHOUT CARDIOVERSION  07/12/2011   Procedure: TRANSESOPHAGEAL ECHOCARDIOGRAM (TEE);  Surgeon: Vinie KYM Maxcy, MD;  Location: [facility] ENDOSCOPY;  Service: Cardiovascular;  Laterality: N/A;  to be done at 1330   TONSILLECTOMY       Social History   Socioeconomic History   Marital status: Married    Spouse name: Dorothy    Number of children: 2   Years of education: 12+   Highest education level: Not on file  Occupational History    Employer: RETIRED  Tobacco Use   Smoking status: Never   Smokeless tobacco: Never  Vaping Use   Vaping status: Never Used  Substance and Sexual Activity   Alcohol  use: Yes    Alcohol /week: 2.0 standard drinks of alcohol     Types: 2 Glasses of wine per week    Comment: occasionally    Drug use: No   Sexual activity: Never  Other Topics Concern   Not on file  Social History Narrative   Patient lives at home wife Naomie. .    Patient has 2 children.    Patient is right handed.    Patient is retired.    Patient has some college.          Social Drivers of Corporate investment banker Strain: Not on file  Food Insecurity: No Food Insecurity (11/29/2023)   Hunger Vital Sign    Worried About Running Out of Food in the Last Year: Never true    Ran Out of Food in the Last Year: Never true  Transportation Needs: No Transportation Needs (11/29/2023)   PRAPARE - Administrator, Civil Service (Medical): No  Lack of Transportation (Non-Medical): No  Physical Activity: Not on file  Stress: Not on file  Social Connections: Socially Integrated (11/29/2023)   Social Connection and Isolation Panel    Frequency of Communication with Friends and Family: Twice a week    Frequency of Social Gatherings with Friends and Family: Twice a week    Attends Religious Services: 1 to 4 times per year    Active Member of [employer] or Organizations: No    Attends Engineer, structural: 1 to 4 times per year    Marital Status: Married  Catering manager Violence: Not At Risk (11/29/2023)   Humiliation, Afraid, Rape, and Kick questionnaire    Fear of Current or Ex-Partner: No    Emotionally Abused: No    Physically Abused: No    Sexually Abused: No   Family History  Problem  Relation Age of Onset   Diabetes Mother    Diabetes Son    Pancreatic cancer Other        GRANDFATHER   Colon cancer Other        INTESTINAL, GRANDMOTHER      VITAL SIGNS BP 114/78   Pulse 78   Temp 97.6 F (36.4 C)   Ht 5' 4 (1.626 m)   Wt 177 lb 12.8 oz (80.6 kg)   SpO2 100%   BMI 30.52 kg/m   Outpatient Encounter Medications as of 12/10/2023  Medication Sig Note   apixaban  (ELIQUIS ) 5 MG TABS tablet Take 1 tablet (5 mg total) by mouth 2 (two) times daily. 11/29/2023: Pt. Remembers taking this medication 11/28/2023 in the morning but does not know the time   atorvastatin  (LIPITOR) 40 MG tablet Take 1 tablet (40 mg total) by mouth every evening.    carvedilol  (COREG ) 25 MG tablet Take 25 mg by mouth 2 (two) times daily with a meal.    Choline Fenofibrate  (FENOFIBRIC ACID ) 135 MG CPDR Take 135 mg by mouth daily.     escitalopram  (LEXAPRO ) 10 MG tablet Take 10 mg by mouth daily.    hydrocortisone  (ANUSOL -HC) 2.5 % rectal cream Apply 1 Application topically 4 (four) times daily as needed for hemorrhoids or anal itching.    Insulin  Glargine Solostar (LANTUS ) 100 UNIT/ML Solostar Pen Inject 12 Units into the skin 2 (two) times daily. Special Instructions: rotate sites - DO NOT HOLD WITHOUT PROVIDER NOTIFICATION  08:00 AM, 08:00 PM    Insulin  lispro (HUMALOG  JUNIOR KWIKPEN) 100 UNIT/ML Inject 10 Units into the skin as directed.  08:00 AM, 12:00 PM, 06:00 PM    linagliptin  (TRADJENTA ) 5 MG TABS tablet Take 1 tablet (5 mg total) by mouth daily.    losartan -hydrochlorothiazide  (HYZAAR) 100-25 MG tablet Take 1 tablet by mouth daily.    mirabegron  ER (MYRBETRIQ ) 50 MG TB24 tablet Take 50 mg by mouth daily.    Continuous Glucose Sensor (FREESTYLE LIBRE 3 PLUS SENSOR) MISC Change sensor every 15 days.    LANTUS  SOLOSTAR 100 UNIT/ML Solostar Pen Inject 7 Units into the skin daily. (Patient not taking: Reported on 12/10/2023)    ONETOUCH VERIO test strip SMARTSIG:1 Strip(s) Via Meter 5 Times  Daily    No facility-administered encounter medications on file as of 12/10/2023.     SIGNIFICANT DIAGNOSTIC EXAMS  LABS   11-29-23: wbc 5.6; hgb 14.1; hct 42.4; mcv 94.0 plt 156; glucose 135; bun 25; creat 1.19; k+ 3.9; na++ 139; ca 9.3 gfr >60; protein 6.3; albumin 3.7; total bili 1.3; chol 128; ldl 74; trig 52; hdl  44; hgb A1c 7.6   Review of Systems  Constitutional:  Negative for malaise/fatigue.  Respiratory:  Negative for cough and shortness of breath.   Cardiovascular:  Negative for chest pain, palpitations and leg swelling.  Gastrointestinal:  Negative for abdominal pain, constipation and heartburn.  Musculoskeletal:  Negative for back pain, joint pain and myalgias.  Skin: Negative.   Neurological:  Negative for dizziness.  Psychiatric/Behavioral:  The patient is not nervous/anxious.    Physical Exam Constitutional:      General: He is not in acute distress.    Appearance: He is well-developed. He is not diaphoretic.  Neck:     Thyroid : No thyromegaly.  Cardiovascular:     Rate and Rhythm: Normal rate and regular rhythm.     Heart sounds: Normal heart sounds.  Pulmonary:     Effort: Pulmonary effort is normal. No respiratory distress.     Breath sounds: Normal breath sounds.  Abdominal:     General: Bowel sounds are normal. There is no distension.     Palpations: Abdomen is soft.     Tenderness: There is no abdominal tenderness.  Musculoskeletal:        General: Normal range of motion.     Cervical back: Neck supple.     Right lower leg: No edema.     Left lower leg: No edema.  Lymphadenopathy:     Cervical: No cervical adenopathy.  Skin:    General: Skin is warm and dry.  Neurological:     Mental Status: He is alert. Mental status is at baseline.  Psychiatric:        Mood and Affect: Mood normal.     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 Will continue tradjenta  5 mg daily   Barnie Seip NP [facility] Adult [facility]  call [phone]

## 2023-12-13 ENCOUNTER — Encounter: Payer: Self-pay | Admitting: Adult Health

## 2023-12-13 ENCOUNTER — Non-Acute Institutional Stay (SKILLED_NURSING_FACILITY): Payer: Self-pay | Admitting: Adult Health

## 2023-12-13 ENCOUNTER — Other Ambulatory Visit: Payer: Self-pay | Admitting: Adult Health

## 2023-12-13 DIAGNOSIS — F0390 Unspecified dementia without behavioral disturbance: Secondary | ICD-10-CM | POA: Diagnosis not present

## 2023-12-13 DIAGNOSIS — I480 Paroxysmal atrial fibrillation: Secondary | ICD-10-CM | POA: Diagnosis not present

## 2023-12-13 DIAGNOSIS — E1159 Type 2 diabetes mellitus with other circulatory complications: Secondary | ICD-10-CM

## 2023-12-13 DIAGNOSIS — G459 Transient cerebral ischemic attack, unspecified: Secondary | ICD-10-CM

## 2023-12-13 DIAGNOSIS — I152 Hypertension secondary to endocrine disorders: Secondary | ICD-10-CM

## 2023-12-13 MED ORDER — LOSARTAN POTASSIUM-HCTZ 100-25 MG PO TABS: 1.0000 | ORAL_TABLET | Freq: Every day | ORAL | 0 refills | Status: DC

## 2023-12-13 MED ORDER — HUMALOG JUNIOR KWIKPEN 100 UNIT/ML ~~LOC~~ SOPN: 10.0000 [IU] | PEN_INJECTOR | Freq: Three times a day (TID) | SUBCUTANEOUS | 0 refills | Status: DC

## 2023-12-13 MED ORDER — FENOFIBRIC ACID 135 MG PO CPDR: 135.0000 mg | DELAYED_RELEASE_CAPSULE | Freq: Every day | ORAL | 0 refills | Status: DC

## 2023-12-13 MED ORDER — APIXABAN 5 MG PO TABS: 5.0000 mg | ORAL_TABLET | Freq: Two times a day (BID) | ORAL | 0 refills | Status: DC

## 2023-12-13 MED ORDER — MIRABEGRON ER 50 MG PO TB24: 50.0000 mg | ORAL_TABLET | Freq: Every day | ORAL | 0 refills | Status: DC

## 2023-12-13 MED ORDER — LINAGLIPTIN 5 MG PO TABS: 5.0000 mg | ORAL_TABLET | Freq: Every day | ORAL | 0 refills | Status: DC

## 2023-12-13 MED ORDER — ATORVASTATIN CALCIUM 40 MG PO TABS: 40.0000 mg | ORAL_TABLET | Freq: Every evening | ORAL | 0 refills | Status: DC

## 2023-12-13 MED ORDER — CARVEDILOL 25 MG PO TABS: 25.0000 mg | ORAL_TABLET | Freq: Two times a day (BID) | ORAL | 0 refills | Status: DC

## 2023-12-13 MED ORDER — INSULIN GLARGINE SOLOSTAR 100 UNIT/ML ~~LOC~~ SOPN: 12.0000 [IU] | PEN_INJECTOR | Freq: Two times a day (BID) | SUBCUTANEOUS | 0 refills | Status: DC

## 2023-12-13 MED ORDER — ESCITALOPRAM OXALATE 10 MG PO TABS: 10.0000 mg | ORAL_TABLET | Freq: Every day | ORAL | 0 refills | Status: DC

## 2023-12-13 NOTE — Progress Notes (Signed)
 Location:  [facility] Room Number: 102 P Place of Service:  SNF (31)   CODE STATUS: DNR  No Known Allergies  Chief Complaint  Patient presents with   Discharge Note    HPI:  He is being discharged to home with home health for pt/ot. He will need a wheelchair. He will need his prescriptions written and will need to follow up with his medical provider. He had been hospitalized for TIA he has increased generalized weakness. He was admitted to this facility for short term rehab: ambulate 150 feet with rolling walker with supervision; upper body min assist; lower body mod assist; transfers at supervision; brp contact guard; steps min assist; SLUMS 3/30.   Past Medical History:  Diagnosis Date   Atrial fibrillation (HCC)    Cholelithiasis 10/2011   Per CT. No cholecystitis radiographically.   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   Shortness of breath    exertion   Sleep apnea    cpap   Stroke [facility]) 2010/2013   Right Brain stroke    Past Surgical History:  Procedure Laterality Date   CARDIOVERSION  07/12/2011   Procedure: CARDIOVERSION;  Surgeon: Vinie KYM Maxcy, MD;  Location: [facility] ENDOSCOPY;  Service: Cardiovascular;  Laterality: N/A;   CAROTID DOPPLER STUDY  01/31/2012   no significant extracranial carotid artery stenosis   CATARACT EXTRACTION W/PHACO Left 01/30/2019   Procedure: CATARACT EXTRACTION PHACO AND INTRAOCULAR LENS PLACEMENT (IOC) (CDE: 4.76);  Surgeon: Harrie Agent, MD;  Location: [facility] ORS;  Service: Ophthalmology;  Laterality: Left;   COLONOSCOPY     DOPPLER ECHOCARDIOGRAPHY  01/31/2012   EF 55-60%, moderately calcified annulus of the mitral valve, left atrium demonstrated mild-moderately  dilated   KNEE SURGERY     TEE WITHOUT CARDIOVERSION  07/12/2011   Procedure: TRANSESOPHAGEAL ECHOCARDIOGRAM (TEE);  Surgeon: Vinie KYM Maxcy, MD;  Location: [facility] ENDOSCOPY;  Service: Cardiovascular;  Laterality: N/A;  to be done at 1330   TONSILLECTOMY      Social History   Socioeconomic History   Marital status: Married    Spouse name: Dorothy    Number of children: 2   Years of education: 12+   Highest education level: Not on file  Occupational History    Employer: RETIRED  Tobacco Use   Smoking status: Never   Smokeless tobacco: Never  Vaping Use   Vaping status: Never Used  Substance and Sexual Activity   Alcohol  use: Yes    Alcohol /week: 2.0 standard drinks of alcohol     Types: 2 Glasses of wine per week    Comment: occasionally    Drug use: No   Sexual activity: Never  Other Topics Concern   Not on file  Social History Narrative   Patient lives at home wife Naomie. .    Patient has 2 children.    Patient is right handed.    Patient is retired.    Patient has some college.          Social Drivers of Corporate investment banker Strain: Not on file  Food Insecurity: No Food Insecurity (11/29/2023)   Hunger Vital Sign    Worried About Running Out of Food in the  Last Year: Never true    Ran Out of Food in the Last Year: Never true  Transportation Needs: No Transportation Needs (11/29/2023)   PRAPARE - Administrator, Civil Service (Medical): No    Lack of Transportation (Non-Medical): No  Physical Activity: Not on file  Stress: Not on file  Social Connections: Socially Integrated (11/29/2023)   Social Connection and Isolation Panel    Frequency of Communication with Friends and Family: Twice a week    Frequency of Social Gatherings with Friends and Family: Twice a week    Attends Religious Services: 1 to 4 times per year    Active Member of [employer] or Organizations: No    Attends Engineer, structural: 1 to 4 times per year    Marital Status:  Married  Catering manager Violence: Not At Risk (11/29/2023)   Humiliation, Afraid, Rape, and Kick questionnaire    Fear of Current or Ex-Partner: No    Emotionally Abused: No    Physically Abused: No    Sexually Abused: No   Family History  Problem Relation Age of Onset   Diabetes Mother    Diabetes Son    Pancreatic cancer Other        GRANDFATHER   Colon cancer Other        INTESTINAL, GRANDMOTHER      VITAL SIGNS BP 114/78   Pulse 78   Temp 97.6 F (36.4 C)   Resp 16   Ht 5' 4 (1.626 m)   Wt 177 lb 12.8 oz (80.6 kg)   SpO2 100%   BMI 30.52 kg/m   Outpatient Encounter Medications as of 12/13/2023  Medication Sig Note   apixaban  (ELIQUIS ) 5 MG TABS tablet Take 1 tablet (5 mg total) by mouth 2 (two) times daily. 11/29/2023: Pt. Remembers taking this medication 11/28/2023 in the morning but does not know the time   atorvastatin  (LIPITOR) 40 MG tablet Take 1 tablet (40 mg total) by mouth every evening.    carvedilol  (COREG ) 25 MG tablet Take 25 mg by mouth 2 (two) times daily with a meal.    Choline Fenofibrate  (FENOFIBRIC ACID ) 135 MG CPDR Take 135 mg by mouth daily.     Continuous Glucose Sensor (FREESTYLE LIBRE 3 PLUS SENSOR) MISC Change sensor every 15 days.    escitalopram  (LEXAPRO ) 10 MG tablet Take 10 mg by mouth daily.    hydrocortisone  (ANUSOL -HC) 2.5 % rectal cream Apply 1 Application topically 4 (four) times daily as needed for hemorrhoids or anal itching.    Insulin  Glargine Solostar (LANTUS ) 100 UNIT/ML Solostar Pen Inject 12 Units into the skin 2 (two) times daily. Special Instructions: rotate sites - DO NOT HOLD WITHOUT PROVIDER NOTIFICATION  08:00 AM, 08:00 PM    Insulin  lispro (HUMALOG  JUNIOR KWIKPEN) 100 UNIT/ML Inject 10 Units into the skin as directed.  08:00 AM, 12:00 PM, 06:00 PM    linagliptin  (TRADJENTA ) 5 MG TABS tablet Take 1 tablet (5 mg total) by mouth daily.    losartan -hydrochlorothiazide  (HYZAAR) 100-25 MG tablet Take 1 tablet by mouth daily.     mirabegron  ER (MYRBETRIQ ) 50 MG TB24 tablet Take 50 mg by mouth daily.    ONETOUCH VERIO test strip SMARTSIG:1 Strip(s) Via Meter 5 Times Daily    LANTUS  SOLOSTAR 100 UNIT/ML Solostar Pen Inject 7 Units into the skin daily. (Patient not taking: Reported on 12/13/2023)    No facility-administered encounter medications on file as of 12/13/2023.     SIGNIFICANT DIAGNOSTIC  EXAMS  LABS   11-29-23: wbc 5.6; hgb 14.1; hct 42.4; mcv 94.0 plt 156; glucose 135; bun 25; creat 1.19; k+ 3.9; na++ 139; ca 9.3 gfr >60; protein 6.3; albumin 3.7; total bili 1.3; chol 128; ldl 74; trig 52; hdl 44; hgb A1c 7.6   Review of Systems  Constitutional:  Negative for malaise/fatigue.  Respiratory:  Negative for cough and shortness of breath.   Cardiovascular:  Negative for chest pain, palpitations and leg swelling.  Gastrointestinal:  Negative for abdominal pain, constipation and heartburn.  Musculoskeletal:  Negative for back pain, joint pain and myalgias.  Skin: Negative.   Neurological:  Negative for dizziness.  Psychiatric/Behavioral:  The patient is not nervous/anxious.      Physical Exam Constitutional:      General: He is not in acute distress.    Appearance: He is well-developed. He is not diaphoretic.  Neck:     Thyroid : No thyromegaly.  Cardiovascular:     Rate and Rhythm: Normal rate and regular rhythm.     Heart sounds: Normal heart sounds.  Pulmonary:     Effort: Pulmonary effort is normal. No respiratory distress.     Breath sounds: Normal breath sounds.  Abdominal:     General: Bowel sounds are normal. There is no distension.     Palpations: Abdomen is soft.     Tenderness: There is no abdominal tenderness.  Musculoskeletal:        General: Normal range of motion.     Cervical back: Neck supple.     Right lower leg: No edema.     Left lower leg: No edema.  Lymphadenopathy:     Cervical: No cervical adenopathy.  Skin:    General: Skin is warm and dry.  Neurological:      Mental Status: He is alert. Mental status is at baseline.  Psychiatric:        Mood and Affect: Mood normal.      ASSESSMENT/ PLAN:   Patient is being discharged with the following home health services:  pt/ot to evaluate and treat as indicated for gait balance strength adl training   Patient is being discharged with the following durable medical equipment:  wheelchair   Patient has been advised to f/u with their PCP in 1-2 weeks to for a transitions of care visit.  Social services at their facility was responsible for arranging this appointment.  Pt was provided with adequate prescriptions of noncontrolled medications to reach the scheduled appointment .  For controlled substances, a limited supply was provided as appropriate for the individual patient.  If the pt normally receives these medications from a pain clinic or has a contract with another physician, these medications should be received from that clinic or physician only).    A 30 day supply of his prescription medications have been sent to [facility]  Time spent with patient: 40 minutes: home health; medications; dme.   Barnie Seip NP [facility]   call [phone]

## 2023-12-17 ENCOUNTER — Emergency Department (HOSPITAL_COMMUNITY)

## 2023-12-17 ENCOUNTER — Inpatient Hospital Stay (HOSPITAL_COMMUNITY)
Admission: EM | Admit: 2023-12-17 | Discharge: 2023-12-20 | DRG: 683 | Disposition: A | Discharge status: Skilled Nursing Facility | Attending: Internal Medicine | Admitting: Internal Medicine

## 2023-12-17 ENCOUNTER — Encounter (HOSPITAL_COMMUNITY): Payer: Self-pay

## 2023-12-17 ENCOUNTER — Other Ambulatory Visit: Payer: Self-pay

## 2023-12-17 ENCOUNTER — Inpatient Hospital Stay (HOSPITAL_COMMUNITY)

## 2023-12-17 DIAGNOSIS — E669 Obesity, unspecified: Secondary | ICD-10-CM | POA: Diagnosis present

## 2023-12-17 DIAGNOSIS — Z833 Family history of diabetes mellitus: Secondary | ICD-10-CM | POA: Diagnosis not present

## 2023-12-17 DIAGNOSIS — Z800 Family history of malignant neoplasm of digestive organs: Secondary | ICD-10-CM

## 2023-12-17 DIAGNOSIS — G319 Degenerative disease of nervous system, unspecified: Secondary | ICD-10-CM | POA: Diagnosis not present

## 2023-12-17 DIAGNOSIS — Z7901 Long term (current) use of anticoagulants: Secondary | ICD-10-CM | POA: Diagnosis not present

## 2023-12-17 DIAGNOSIS — W06XXXA Fall from bed, initial encounter: Secondary | ICD-10-CM | POA: Diagnosis present

## 2023-12-17 DIAGNOSIS — I951 Orthostatic hypotension: Secondary | ICD-10-CM | POA: Diagnosis not present

## 2023-12-17 DIAGNOSIS — N390 Urinary tract infection, site not specified: Secondary | ICD-10-CM | POA: Diagnosis not present

## 2023-12-17 DIAGNOSIS — I700 Atherosclerosis of aorta: Secondary | ICD-10-CM | POA: Diagnosis not present

## 2023-12-17 DIAGNOSIS — M545 Low back pain, unspecified: Secondary | ICD-10-CM | POA: Diagnosis not present

## 2023-12-17 DIAGNOSIS — I350 Nonrheumatic aortic (valve) stenosis: Secondary | ICD-10-CM | POA: Diagnosis present

## 2023-12-17 DIAGNOSIS — M75101 Unspecified rotator cuff tear or rupture of right shoulder, not specified as traumatic: Secondary | ICD-10-CM | POA: Diagnosis present

## 2023-12-17 DIAGNOSIS — R9089 Other abnormal findings on diagnostic imaging of central nervous system: Secondary | ICD-10-CM | POA: Diagnosis not present

## 2023-12-17 DIAGNOSIS — G4733 Obstructive sleep apnea (adult) (pediatric): Secondary | ICD-10-CM | POA: Diagnosis present

## 2023-12-17 DIAGNOSIS — Z79899 Other long term (current) drug therapy: Secondary | ICD-10-CM | POA: Diagnosis not present

## 2023-12-17 DIAGNOSIS — I69354 Hemiplegia and hemiparesis following cerebral infarction affecting left non-dominant side: Secondary | ICD-10-CM | POA: Diagnosis not present

## 2023-12-17 DIAGNOSIS — E119 Type 2 diabetes mellitus without complications: Secondary | ICD-10-CM

## 2023-12-17 DIAGNOSIS — I77810 Thoracic aortic ectasia: Secondary | ICD-10-CM | POA: Diagnosis present

## 2023-12-17 DIAGNOSIS — N179 Acute kidney failure, unspecified: Principal | ICD-10-CM | POA: Diagnosis present

## 2023-12-17 DIAGNOSIS — I6782 Cerebral ischemia: Secondary | ICD-10-CM | POA: Diagnosis not present

## 2023-12-17 DIAGNOSIS — I4819 Other persistent atrial fibrillation: Secondary | ICD-10-CM | POA: Diagnosis not present

## 2023-12-17 DIAGNOSIS — E860 Dehydration: Secondary | ICD-10-CM | POA: Diagnosis not present

## 2023-12-17 DIAGNOSIS — M19011 Primary osteoarthritis, right shoulder: Secondary | ICD-10-CM | POA: Diagnosis not present

## 2023-12-17 DIAGNOSIS — K219 Gastro-esophageal reflux disease without esophagitis: Secondary | ICD-10-CM | POA: Diagnosis present

## 2023-12-17 DIAGNOSIS — I69322 Dysarthria following cerebral infarction: Secondary | ICD-10-CM

## 2023-12-17 DIAGNOSIS — I152 Hypertension secondary to endocrine disorders: Secondary | ICD-10-CM | POA: Diagnosis not present

## 2023-12-17 DIAGNOSIS — F0390 Unspecified dementia without behavioral disturbance: Secondary | ICD-10-CM | POA: Diagnosis present

## 2023-12-17 DIAGNOSIS — Z043 Encounter for examination and observation following other accident: Secondary | ICD-10-CM | POA: Diagnosis not present

## 2023-12-17 DIAGNOSIS — Y92003 Bedroom of unspecified non-institutional (private) residence as the place of occurrence of the external cause: Secondary | ICD-10-CM | POA: Diagnosis not present

## 2023-12-17 DIAGNOSIS — Z8673 Personal history of transient ischemic attack (TIA), and cerebral infarction without residual deficits: Secondary | ICD-10-CM

## 2023-12-17 DIAGNOSIS — E1159 Type 2 diabetes mellitus with other circulatory complications: Secondary | ICD-10-CM | POA: Diagnosis present

## 2023-12-17 DIAGNOSIS — G4730 Sleep apnea, unspecified: Secondary | ICD-10-CM | POA: Diagnosis not present

## 2023-12-17 DIAGNOSIS — Z6830 Body mass index (BMI) 30.0-30.9, adult: Secondary | ICD-10-CM

## 2023-12-17 DIAGNOSIS — B961 Klebsiella pneumoniae [K. pneumoniae] as the cause of diseases classified elsewhere: Secondary | ICD-10-CM | POA: Diagnosis present

## 2023-12-17 DIAGNOSIS — F32A Depression, unspecified: Secondary | ICD-10-CM | POA: Diagnosis present

## 2023-12-17 DIAGNOSIS — Z794 Long term (current) use of insulin: Secondary | ICD-10-CM

## 2023-12-17 DIAGNOSIS — E785 Hyperlipidemia, unspecified: Secondary | ICD-10-CM | POA: Diagnosis not present

## 2023-12-17 DIAGNOSIS — F339 Major depressive disorder, recurrent, unspecified: Secondary | ICD-10-CM | POA: Diagnosis present

## 2023-12-17 DIAGNOSIS — N281 Cyst of kidney, acquired: Secondary | ICD-10-CM | POA: Diagnosis not present

## 2023-12-17 DIAGNOSIS — W19XXXA Unspecified fall, initial encounter: Secondary | ICD-10-CM | POA: Diagnosis not present

## 2023-12-17 DIAGNOSIS — T796XXA Traumatic ischemia of muscle, initial encounter: Secondary | ICD-10-CM

## 2023-12-17 DIAGNOSIS — T796XXS Traumatic ischemia of muscle, sequela: Secondary | ICD-10-CM | POA: Diagnosis not present

## 2023-12-17 DIAGNOSIS — R079 Chest pain, unspecified: Secondary | ICD-10-CM | POA: Diagnosis not present

## 2023-12-17 DIAGNOSIS — K8020 Calculus of gallbladder without cholecystitis without obstruction: Secondary | ICD-10-CM | POA: Diagnosis not present

## 2023-12-17 DIAGNOSIS — M6282 Rhabdomyolysis: Secondary | ICD-10-CM | POA: Diagnosis present

## 2023-12-17 DIAGNOSIS — T796XXD Traumatic ischemia of muscle, subsequent encounter: Secondary | ICD-10-CM | POA: Diagnosis not present

## 2023-12-17 DIAGNOSIS — M549 Dorsalgia, unspecified: Secondary | ICD-10-CM | POA: Diagnosis not present

## 2023-12-17 DIAGNOSIS — M25511 Pain in right shoulder: Secondary | ICD-10-CM | POA: Diagnosis not present

## 2023-12-17 DIAGNOSIS — M25711 Osteophyte, right shoulder: Secondary | ICD-10-CM | POA: Diagnosis not present

## 2023-12-17 DIAGNOSIS — Z7984 Long term (current) use of oral hypoglycemic drugs: Secondary | ICD-10-CM

## 2023-12-17 DIAGNOSIS — I480 Paroxysmal atrial fibrillation: Secondary | ICD-10-CM | POA: Diagnosis not present

## 2023-12-17 DIAGNOSIS — I10 Essential (primary) hypertension: Secondary | ICD-10-CM | POA: Diagnosis present

## 2023-12-17 LAB — COMPREHENSIVE METABOLIC PANEL WITH GFR
ALT: 37 U/L (ref 0–44)
AST: 88 U/L — ABNORMAL HIGH (ref 15–41)
Albumin: 3.3 g/dL — ABNORMAL LOW (ref 3.5–5.0)
Alkaline Phosphatase: 43 U/L (ref 38–126)
Anion gap: 12 (ref 5–15)
BUN: 71 mg/dL — ABNORMAL HIGH (ref 8–23)
CO2: 25 mmol/L (ref 22–32)
Calcium: 8.7 mg/dL — ABNORMAL LOW (ref 8.9–10.3)
Chloride: 103 mmol/L (ref 98–111)
Creatinine, Ser: 2.81 mg/dL — ABNORMAL HIGH (ref 0.61–1.24)
GFR, Estimated: 22 mL/min — ABNORMAL LOW (ref >60)
Glucose, Bld: 145 mg/dL — ABNORMAL HIGH (ref 70–99)
Potassium: 3.7 mmol/L (ref 3.5–5.1)
Sodium: 140 mmol/L (ref 135–145)
Total Bilirubin: 2 mg/dL — ABNORMAL HIGH (ref 0.0–1.2)
Total Protein: 5.8 g/dL — ABNORMAL LOW (ref 6.5–8.1)

## 2023-12-17 LAB — CBC WITH DIFFERENTIAL/PLATELET
Abs Immature Granulocytes: 0.04 K/uL (ref 0.00–0.07)
Basophils Absolute: 0 K/uL (ref 0.0–0.1)
Basophils Relative: 0 %
Eosinophils Absolute: 0.1 K/uL (ref 0.0–0.5)
Eosinophils Relative: 1 %
HCT: 41 % (ref 39.0–52.0)
Hemoglobin: 13.7 g/dL (ref 13.0–17.0)
Immature Granulocytes: 1 %
Lymphocytes Relative: 16 %
Lymphs Abs: 1.3 K/uL (ref 0.7–4.0)
MCH: 31.9 pg (ref 26.0–34.0)
MCHC: 33.4 g/dL (ref 30.0–36.0)
MCV: 95.6 fL (ref 80.0–100.0)
Monocytes Absolute: 1 K/uL (ref 0.1–1.0)
Monocytes Relative: 12 %
Neutro Abs: 5.8 K/uL (ref 1.7–7.7)
Neutrophils Relative %: 70 %
Platelets: 152 K/uL (ref 150–400)
RBC: 4.29 MIL/uL (ref 4.22–5.81)
RDW: 12.9 % (ref 11.5–15.5)
WBC: 8.1 K/uL (ref 4.0–10.5)
nRBC: 0 % (ref 0.0–0.2)

## 2023-12-17 LAB — URINALYSIS, ROUTINE W REFLEX MICROSCOPIC
Bacteria, UA: NONE SEEN
Bilirubin Urine: NEGATIVE
Glucose, UA: NEGATIVE mg/dL
Ketones, ur: NEGATIVE mg/dL
Nitrite: NEGATIVE
Protein, ur: NEGATIVE mg/dL
Specific Gravity, Urine: 1.018 (ref 1.005–1.030)
pH: 5 (ref 5.0–8.0)

## 2023-12-17 LAB — TROPONIN I (HIGH SENSITIVITY)
Troponin I (High Sensitivity): 29 ng/L — ABNORMAL HIGH (ref <18)
Troponin I (High Sensitivity): 23 ng/L — ABNORMAL HIGH (ref <18)

## 2023-12-17 LAB — LACTIC ACID, PLASMA
Lactic Acid, Venous: 1.7 mmol/L (ref 0.5–1.9)
Lactic Acid, Venous: 1.3 mmol/L (ref 0.5–1.9)

## 2023-12-17 LAB — CBG MONITORING, ED
Glucose-Capillary: 108 mg/dL — ABNORMAL HIGH (ref 70–99)
Glucose-Capillary: 93 mg/dL (ref 70–99)

## 2023-12-17 LAB — GLUCOSE, CAPILLARY: Glucose-Capillary: 71 mg/dL (ref 70–99)

## 2023-12-17 MED ORDER — CARVEDILOL 12.5 MG PO TABS: 25.0000 mg | ORAL_TABLET | Freq: Two times a day (BID) | ORAL | Status: DC

## 2023-12-17 MED ORDER — FENTANYL CITRATE (PF) 100 MCG/2ML IJ SOLN: 12.5000 ug | INTRAMUSCULAR | Status: DC | PRN

## 2023-12-17 MED ORDER — ATORVASTATIN CALCIUM 40 MG PO TABS
40.0000 mg | ORAL_TABLET | Freq: Every evening | ORAL | Status: DC
Start: 2023-12-17 — End: 2023-12-19
  Administered 2023-12-17 – 2023-12-18 (×2): 40 mg via ORAL
  Filled 2023-12-17 (×2): qty 1

## 2023-12-17 MED ORDER — MIRABEGRON ER 25 MG PO TB24
50.0000 mg | ORAL_TABLET | Freq: Every day | ORAL | Status: DC
  Administered 2023-12-17 – 2023-12-20 (×4): 50 mg via ORAL
  Filled 2023-12-17 (×4): qty 2

## 2023-12-17 MED ORDER — SODIUM CHLORIDE 0.9 % IV BOLUS
1000.0000 mL | Freq: Once | INTRAVENOUS | Status: AC
  Administered 2023-12-17: 1000 mL via INTRAVENOUS

## 2023-12-17 MED ORDER — LINAGLIPTIN 5 MG PO TABS
5.0000 mg | ORAL_TABLET | Freq: Every day | ORAL | Status: DC
Start: 2023-12-18 — End: 2023-12-18
  Administered 2023-12-18: 5 mg via ORAL
  Filled 2023-12-17: qty 1

## 2023-12-17 MED ORDER — LACTATED RINGERS IV SOLN: INTRAVENOUS | Status: DC

## 2023-12-17 MED ORDER — ESCITALOPRAM OXALATE 10 MG PO TABS
10.0000 mg | ORAL_TABLET | Freq: Every day | ORAL | Status: DC
  Administered 2023-12-17 – 2023-12-20 (×4): 10 mg via ORAL
  Filled 2023-12-17 (×4): qty 1

## 2023-12-17 MED ORDER — ONDANSETRON HCL 4 MG PO TABS: 4.0000 mg | ORAL_TABLET | Freq: Four times a day (QID) | ORAL | Status: DC | PRN

## 2023-12-17 MED ORDER — TRAZODONE HCL 50 MG PO TABS
25.0000 mg | ORAL_TABLET | Freq: Every evening | ORAL | Status: DC | PRN
  Administered 2023-12-18 – 2023-12-19 (×2): 25 mg via ORAL
  Filled 2023-12-17 (×2): qty 1

## 2023-12-17 MED ORDER — ACETAMINOPHEN 650 MG RE SUPP: 650.0000 mg | Freq: Four times a day (QID) | RECTAL | Status: DC | PRN

## 2023-12-17 MED ORDER — BISACODYL 5 MG PO TBEC: 5.0000 mg | DELAYED_RELEASE_TABLET | Freq: Every day | ORAL | Status: DC | PRN

## 2023-12-17 MED ORDER — APIXABAN 5 MG PO TABS: 5.0000 mg | ORAL_TABLET | Freq: Two times a day (BID) | ORAL | Status: DC

## 2023-12-17 MED ORDER — ENOXAPARIN SODIUM 30 MG/0.3ML IJ SOSY: 30.0000 mg | PREFILLED_SYRINGE | INTRAMUSCULAR | Status: DC

## 2023-12-17 MED ORDER — OXYCODONE HCL 5 MG PO TABS: 5.0000 mg | ORAL_TABLET | Freq: Four times a day (QID) | ORAL | Status: DC | PRN

## 2023-12-17 MED ORDER — APIXABAN 2.5 MG PO TABS
2.5000 mg | ORAL_TABLET | Freq: Two times a day (BID) | ORAL | Status: DC
  Administered 2023-12-17 – 2023-12-20 (×7): 2.5 mg via ORAL
  Filled 2023-12-17 (×7): qty 1

## 2023-12-17 MED ORDER — CARVEDILOL 3.125 MG PO TABS
6.2500 mg | ORAL_TABLET | Freq: Two times a day (BID) | ORAL | Status: DC
Start: 2023-12-17 — End: 2023-12-18
  Administered 2023-12-17 – 2023-12-18 (×2): 6.25 mg via ORAL
  Filled 2023-12-17 (×2): qty 2

## 2023-12-17 MED ORDER — INSULIN ASPART 100 UNIT/ML IJ SOLN
0.0000 [IU] | Freq: Three times a day (TID) | INTRAMUSCULAR | Status: DC
  Administered 2023-12-18: 3 [IU] via SUBCUTANEOUS
  Administered 2023-12-18 – 2023-12-19 (×3): 2 [IU] via SUBCUTANEOUS
  Administered 2023-12-19 – 2023-12-20 (×2): 3 [IU] via SUBCUTANEOUS
  Administered 2023-12-20: 2 [IU] via SUBCUTANEOUS

## 2023-12-17 MED ORDER — ONDANSETRON HCL 4 MG/2ML IJ SOLN: 4.0000 mg | Freq: Four times a day (QID) | INTRAMUSCULAR | Status: DC | PRN

## 2023-12-17 MED ORDER — ACETAMINOPHEN 325 MG PO TABS: 650.0000 mg | ORAL_TABLET | Freq: Four times a day (QID) | ORAL | Status: DC | PRN

## 2023-12-17 NOTE — ED Provider Notes (Signed)
 [place] EMERGENCY DEPARTMENT AT [facility] Provider Note   CSN: [id number] Arrival date & time: 12/17/23  9365     Patient presents with: No chief complaint on file.   [name] is a 83 y.o. male.   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  Pt fell out of bed this morning.  He does not remember exactly what happened, but fell.  He is a poor historian.  He complained of some low back pain earlier, but that's gone now.  He said he feels well now.  However, BP is in the upper 70s and 80s.  Pt said he did take his meds this am.       Prior to Admission medications   Medication Sig Start Date End Date Taking? Authorizing Provider  apixaban  (ELIQUIS ) 5 MG TABS tablet Take 1 tablet (5 mg total) by mouth 2 (two) times daily. 12/13/23   Landy Barnie RAMAN, NP  atorvastatin  (LIPITOR) 40 MG tablet Take 1 tablet (40 mg total) by mouth every evening. 12/13/23   Landy Barnie RAMAN, NP  carvedilol  (COREG ) 25 MG tablet Take 1 tablet (25 mg total) by mouth 2 (two) times daily with a meal. 12/13/23   Landy Barnie RAMAN, NP  Choline Fenofibrate  (FENOFIBRIC ACID ) 135 MG CPDR Take 135 mg by mouth daily. 12/13/23   Landy Barnie RAMAN, NP  Continuous Glucose Sensor (FREESTYLE LIBRE 3 PLUS SENSOR) MISC Change sensor every 15 days. 08/31/23   Geroldine Berg, MD  escitalopram  (LEXAPRO ) 10 MG tablet Take 1 tablet (10 mg total) by mouth daily. 12/13/23   Landy Barnie RAMAN, NP  hydrocortisone  (ANUSOL -HC) 2.5 % rectal cream Apply 1 Application topically 4 (four) times daily as needed for hemorrhoids or anal itching. 11/30/23   Vicci Afton CROME, MD  Insulin  Glargine Solostar (LANTUS ) 100 UNIT/ML Solostar Pen Inject 12 Units into the skin 2 (two) times daily. 12/13/23   Landy Barnie RAMAN, NP  Insulin  lispro (HUMALOG  JUNIOR KWIKPEN) 100 UNIT/ML Inject 10 Units into the skin 3 (three) times daily. With meals  12/13/23   Landy Barnie RAMAN, NP  linagliptin  (TRADJENTA ) 5 MG TABS tablet Take 1 tablet (5 mg total) by mouth daily. 12/13/23   Landy Barnie RAMAN, NP  losartan -hydrochlorothiazide  (HYZAAR) 100-25 MG tablet Take 1 tablet by mouth daily. 12/13/23   Landy Barnie RAMAN, NP  mirabegron  ER (MYRBETRIQ ) 50 MG TB24 tablet Take 1 tablet (50 mg total) by mouth daily. 12/13/23   Landy Barnie RAMAN, NP  AISHA VERIO test strip SMARTSIG:1 Strip(s) Via Meter 5 Times Daily 01/15/19   [provider]    Allergies: Patient has no known allergies.    Review of Systems  Musculoskeletal:  Positive for back pain.  All other systems reviewed and are negative.   Updated Vital Signs BP 105/65   Pulse 76   Temp 98.1 F (36.7 C) (Oral)   Resp 15   SpO2 97%   Physical Exam Vitals and nursing note reviewed.  Constitutional:      Appearance: Normal appearance.  HENT:     Head: Normocephalic and atraumatic.     Right Ear: External ear normal.     Left Ear: External ear normal.     Nose: Nose normal.     Mouth/Throat:     Mouth: Mucous membranes are moist.     Pharynx: Oropharynx is clear.  Eyes:  Extraocular Movements: Extraocular movements intact.     Conjunctiva/sclera: Conjunctivae normal.     Pupils: Pupils are equal, round, and reactive to light.  Cardiovascular:     Rate and Rhythm: Normal rate. Rhythm irregular.     Pulses: Normal pulses.     Heart sounds: Normal heart sounds.  Pulmonary:     Effort: Pulmonary effort is normal.     Breath sounds: Normal breath sounds.  Abdominal:     General: Abdomen is flat. Bowel sounds are normal.     Palpations: Abdomen is soft.  Musculoskeletal:     Cervical back: Normal range of motion and neck supple.  Neurological:     Mental Status: He is alert.     (all labs ordered are listed, but only abnormal results are displayed) Labs Reviewed  COMPREHENSIVE METABOLIC PANEL WITH GFR - Abnormal; Notable for the following components:      Result  Value   Glucose, Bld 145 (*)    BUN 71 (*)    Creatinine, Ser 2.81 (*)    Calcium  8.7 (*)    Total Protein 5.8 (*)    Albumin 3.3 (*)    AST 88 (*)    Total Bilirubin 2.0 (*)    GFR, Estimated 22 (*)    All other components within normal limits  URINALYSIS, ROUTINE W REFLEX MICROSCOPIC - Abnormal; Notable for the following components:   Color, Urine AMBER (*)    APPearance HAZY (*)    Hgb urine dipstick SMALL (*)    Leukocytes,Ua TRACE (*)    All other components within normal limits  TROPONIN I (HIGH SENSITIVITY) - Abnormal; Notable for the following components:   Troponin I (High Sensitivity) 29 (*)    All other components within normal limits  TROPONIN I (HIGH SENSITIVITY) - Abnormal; Notable for the following components:   Troponin I (High Sensitivity) 23 (*)    All other components within normal limits  CULTURE, BLOOD (ROUTINE X 2)  CULTURE, BLOOD (ROUTINE X 2)  CBC WITH DIFFERENTIAL/PLATELET  LACTIC ACID, PLASMA  LACTIC ACID, PLASMA    EKG: EKG Interpretation Date/Time:  Tuesday December 17 2023 07:22:53 EDT Ventricular Rate:  87 PR Interval:    QRS Duration:  143 QT Interval:  429 QTC Calculation: 517 R Axis:   34  Text Interpretation: Atrial fibrillation Nonspecific intraventricular conduction delay Minimal ST depression, inferior leads No significant change since last tracing Confirmed by Dean Clarity [phone]) on 12/17/2023 7:35:41 AM  Radiology: CT CHEST ABDOMEN PELVIS WO CONTRAST Result Date: 12/17/2023 CLINICAL DATA:  Fall from bed.  Mid to lower back pain. EXAM: CT CHEST, ABDOMEN AND PELVIS WITHOUT CONTRAST TECHNIQUE: Multidetector CT imaging of the chest, abdomen and pelvis was performed following the standard protocol without IV contrast. RADIATION DOSE REDUCTION: This exam was performed according to the departmental dose-optimization program which includes automated exposure control, adjustment of the mA and/or kV according to patient size and/or use of  iterative reconstruction technique. COMPARISON:  CT chest abdomen and pelvis dated 03/14/2014. FINDINGS: CT CHEST FINDINGS Cardiovascular: Cardiomegaly. No pleural effusion. Multivessel coronary artery calcifications. Aortic valve calcifications. Thoracic aorta is normal in caliber with atherosclerotic calcification. Mediastinum/Nodes: No enlarged mediastinal or axillary lymph nodes. Trachea and esophagus demonstrate no significant findings. Lungs/Pleura: Motion degradation slightly limits the evaluation. Dependent changes at the posterior lung bases. Regions of scarring/linear atelectasis in the right upper and middle lobes and the bilateral lung bases. Mild bronchial wall thickening. No pleural effusion or pneumothorax. Musculoskeletal: No  acute fracture. Remote healed fracture of the right posterior eighth rib. CT ABDOMEN PELVIS FINDINGS Hepatobiliary: No evidence of hepatic injury. No suspicious focal hepatic lesion identified within the limits of an unenhanced exam. Cholelithiasis. No gallbladder wall thickening or pericholecystic fluid. No biliary dilatation. Pancreas: Unremarkable. Spleen: Unremarkable. Adrenals/Urinary Tract: Adrenal glands are unremarkable. Left renal cyst, for which no follow-up imaging is recommended. No urolithiasis or hydronephrosis. Bladder is unremarkable. Stomach/Bowel: Stomach and small bowel are grossly unremarkable. No obstruction or inflammatory changes. Moderate volume of stool in the rectum. Vascular/Lymphatic: Atherosclerotic calcification of the abdominal aorta and its major branches. Nonaneurysmal abdominal aorta. No enlarged abdominopelvic lymph nodes. Reproductive: Prostate is enlarged measuring up to 7 cm in diameter. Other: No abdominopelvic ascites.  No intraperitoneal free air. Musculoskeletal: No acute osseous abnormality. 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 Multilevel degenerative changes of the thoracolumbar spine.  IMPRESSION: 1. No acute traumatic findings in the chest, abdomen, or pelvis. 2. Cholelithiasis. 3. Prostatomegaly. 4. Additional nonacute findings, as described above. 5.  Aortic Atherosclerosis (ICD10-I70.0). Electronically Signed   By: Harrietta Sherry M.D.   On: 12/17/2023 09:39   CT L-SPINE NO CHARGE Result Date: 12/17/2023 CLINICAL DATA:  Mid to low back pain after falling from bed. EXAM: CT Lumbar Spine without contrast TECHNIQUE: Technique: Multiplanar CT images of the lumbar spine were reconstructed from contemporary CT of the Abdomen and Pelvis. RADIATION DOSE REDUCTION: This exam was performed according to the departmental dose-optimization program which includes automated exposure control, adjustment of the mA and/or kV according to patient size and/or use of iterative reconstruction technique. CONTRAST:  No additional COMPARISON:  CT of the chest, abdomen and pelvis 03/14/2014. FINDINGS: Technical note: Suboptimal image quality, in part secondary to motion artifact. Segmentation: There are 5 lumbar type vertebral bodies. Alignment: Chronic grade 1 anterolisthesis at L5-S1 secondary to chronic bilateral L5 pars defects. Mild convex left scoliosis and straightening. Vertebrae: No evidence of acute fracture or traumatic subluxation. There is multilevel spondylosis with partially bridging endplate osteophytes. Partially bridging osteophytes are present at both sacroiliac joints. Paraspinal and other soft tissues: No acute paraspinal findings are identified. There is diffuse aortic and branch vessel atherosclerosis. Intra-details are dictated separately. Disc levels: Multilevel spondylosis with endplate osteophytes and facet hypertrophy. There is mild spinal stenosis and mild asymmetric narrowing of the right lateral recess and right foramen at L3-4. No significant spinal stenosis or foraminal narrowing at L4-5. At L5-S1, there is moderate chronic foraminal narrowing bilaterally due to the L5 pars defects  and associated anterolisthesis. IMPRESSION: 1. No evidence of acute lumbar spine fracture, traumatic subluxation or static signs of instability. 2. 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 3. 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 4. Mild spinal stenosis and asymmetric narrowing of the right lateral recess and right foramen at L3-4. 5.  Aortic Atherosclerosis (ICD10-I70.0). Electronically Signed   By: Elsie Perone M.D.   On: 12/17/2023 09:36   CT Head Wo Contrast Result Date: 12/17/2023 CLINICAL DATA:  Patient fell out of bed.  Back pain EXAM: CT HEAD WITHOUT CONTRAST CT CERVICAL SPINE WITHOUT CONTRAST TECHNIQUE: Multidetector CT imaging of the head and cervical spine was performed following the standard protocol without intravenous contrast. Multiplanar CT image reconstructions of the cervical spine were also generated. RADIATION DOSE REDUCTION: This exam was performed according to the departmental dose-optimization program which includes automated exposure  control, adjustment of the mA and/or kV according to patient size and/or use of iterative reconstruction technique. COMPARISON:  Head CT 11/29/2023.  Cervical spine CT 08/08/2016 FINDINGS: CT HEAD FINDINGS Brain: There is no evidence for acute hemorrhage, hydrocephalus, mass lesion, or abnormal extra-axial fluid collection. No definite CT evidence for acute infarction. Diffuse loss of parenchymal volume is consistent with atrophy. Patchy low attenuation in the deep hemispheric and periventricular white matter is nonspecific, but likely reflects chronic microvascular ischemic demyelination. Old infarcts are seen in the inferior and posterior left frontal region and left occipital lobe chronic post ischemic encephalomalacia is again noted in the right basal ganglia. Vascular: No hyperdense vessel or unexpected  calcification. Skull: No evidence for fracture. No worrisome lytic or sclerotic lesion. Sinuses/Orbits: Chronic mucosal disease noted in the maxillary sinuses. Visualized portions of the globes and intraorbital fat are unremarkable. Other: None. CT CERVICAL SPINE FINDINGS Alignment: Normal. Skull base and vertebrae: No acute fracture. No primary bone lesion or focal pathologic process. Soft tissues and spinal canal: No prevertebral fluid or swelling. No visible canal hematoma. Disc levels: Diffuse loss of intervertebral disc height noted in the cervical spine from C2-3 to C6-7 with associated endplate spurring. Facets are well aligned bilaterally. Degenerative changes are noted at the C1-2 articulation. Upper chest: Unremarkable. Other: None. IMPRESSION: 1. No acute intracranial abnormality. 2. Atrophy with chronic small vessel ischemic disease and old infarcts as above. 3. Degenerative changes in the cervical spine without fracture. Electronically Signed   By: Camellia Candle M.D.   On: 12/17/2023 09:20   CT Cervical Spine Wo Contrast Result Date: 12/17/2023 CLINICAL DATA:  Patient fell out of bed.  Back pain EXAM: CT HEAD WITHOUT CONTRAST CT CERVICAL SPINE WITHOUT CONTRAST TECHNIQUE: Multidetector CT imaging of the head and cervical spine was performed following the standard protocol without intravenous contrast. Multiplanar CT image reconstructions of the cervical spine were also generated. RADIATION DOSE REDUCTION: This exam was performed according to the departmental dose-optimization program which includes automated exposure control, adjustment of the mA and/or kV according to patient size and/or use of iterative reconstruction technique. COMPARISON:  Head CT 11/29/2023.  Cervical spine CT 08/08/2016 FINDINGS: CT HEAD FINDINGS Brain: There is no evidence for acute hemorrhage, hydrocephalus, mass lesion, or abnormal extra-axial fluid collection. No definite CT evidence for acute infarction. Diffuse loss of  parenchymal volume is consistent with atrophy. Patchy low attenuation in the deep hemispheric and periventricular white matter is nonspecific, but likely reflects chronic microvascular ischemic demyelination. Old infarcts are seen in the inferior and posterior left frontal region and left occipital lobe chronic post ischemic encephalomalacia is again noted in the right basal ganglia. Vascular: No hyperdense vessel or unexpected calcification. Skull: No evidence for fracture. No worrisome lytic or sclerotic lesion. Sinuses/Orbits: Chronic mucosal disease noted in the maxillary sinuses. Visualized portions of the globes and intraorbital fat are unremarkable. Other: None. CT CERVICAL SPINE FINDINGS Alignment: Normal. Skull base and vertebrae: No acute fracture. No primary bone lesion or focal pathologic process. Soft tissues and spinal canal: No prevertebral fluid or swelling. No visible canal hematoma. Disc levels: Diffuse loss of intervertebral disc height noted in the cervical spine from C2-3 to C6-7 with associated endplate spurring. Facets are well aligned bilaterally. Degenerative changes are noted at the C1-2 articulation. Upper chest: Unremarkable. Other: None. IMPRESSION: 1. No acute intracranial abnormality. 2. Atrophy with chronic small vessel ischemic disease and old infarcts as above. 3. Degenerative changes in the cervical spine without fracture. Electronically Signed  By: Camellia Candle M.D.   On: 12/17/2023 09:20   DG Pelvis Portable Result Date: 12/17/2023 CLINICAL DATA:  Fall off bed. EXAM: PORTABLE PELVIS 1-2 VIEWS COMPARISON:  None Available. FINDINGS: There is no evidence of pelvic fracture or diastasis. No pelvic bone lesions are seen. IMPRESSION: No acute abnormality seen. Electronically Signed   By: Lynwood Landy Raddle M.D.   On: 12/17/2023 08:15   DG Chest Portable 1 View Result Date: 12/17/2023 CLINICAL DATA:  Lower back pain after fall. EXAM: PORTABLE CHEST 1 VIEW COMPARISON:  Aug 17, 2017.  FINDINGS: Stable cardiomediastinal silhouette. Left lung is clear. Stable probable right basilar scarring is noted. No definite acute pulmonary abnormality is noted. Bony thorax is unremarkable. IMPRESSION: No active disease. Electronically Signed   By: Lynwood Landy Raddle M.D.   On: 12/17/2023 08:14     Procedures   Medications Ordered in the ED  sodium chloride  0.9 % bolus 1,000 mL (0 mLs Intravenous Stopped 12/17/23 0844)  sodium chloride  0.9 % bolus 1,000 mL (0 mLs Intravenous Stopped 12/17/23 1015)                                    Medical Decision Making Amount and/or Complexity of Data Reviewed Labs: ordered. Radiology: ordered.  Risk Decision regarding hospitalization.   This patient presents to the ED for concern of fall, this involves an extensive number of treatment options, and is a complaint that carries with it a high risk of complications and morbidity.  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  The pertinent results include:  cbc nl, cmp with bun 71 and cr 2.81 (bun 19 and cr 1 on 8/30); trop elevated at 29, but 2nd is 23; lactic nl at 1.7; urine neg for uti   Imaging Studies ordered:  I ordered imaging studies including pelvis, cxr, ct head/c-spine/chest/abd/pelvis  I independently visualized and interpreted imaging which showed  Pelvis: No acute abnormality seen.  CXR: No active disease.  CT head/c-spine:  No acute intracranial abnormality.  2. Atrophy with chronic small vessel ischemic disease and old  infarcts as above.  3. Degenerative  changes in the cervical spine without fracture.  CT chest/abd/pelvis: . No acute traumatic findings in the chest, abdomen, or pelvis.  2. Cholelithiasis.  3. Prostatomegaly.  4. Additional nonacute findings, as described above.  5.  Aortic Atherosclerosis (ICD10-I70.0).  CT lumbar: . No evidence of acute lumbar spine fracture, traumatic subluxation  or static signs of instability.  2. Multilevel spondylosis with partially bridging osteophytes  contributing to rigid spine physiology.  3. 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  4. Mild spinal stenosis and asymmetric narrowing of the right  lateral recess and right foramen at L3-4.  5.  Aortic Atherosclerosis (ICD10-I70.0).   I agree with the radiologist interpretation   Cardiac Monitoring:  The patient was maintained on a cardiac monitor.  I personally viewed and interpreted the cardiac monitored which showed an underlying rhythm of: af  Medicines ordered and prescription drug management:  I ordered medication including ivfs  for sx  Reevaluation of the patient after these medicines showed that the patient improved I have reviewed the patients home medicines and have made adjustments as needed   Test Considered:  ct   Critical Interventions:  ivfs   Consultations Obtained:  I requested consultation with the hospitalist (Dr. Vicci),  and discussed lab and imaging findings as well as pertinent plan -he will admit   Problem List / ED Course:  Aki with hypotension:  pt given IVFs.  BP has improved.  Hypotension is likely what caused the fall. Fall:  no internal traumatic injuries   Reevaluation:  After the interventions noted above, I reevaluated the patient and found that they have :improved   Social Determinants of Health:  Lives at home   Dispostion:  After consideration of the diagnostic results and the patients  response to treatment, I feel that the patent would benefit from admission.       Final diagnoses:  AKI (acute kidney injury) [facility])  Dehydration    ED Discharge Orders     None          Dean Clarity, MD 12/17/23 1052

## 2023-12-17 NOTE — H&P (Signed)
 History and Physical  [facility]  LORAN FLEET FMW:[id number] DOB: [birth year] DOA: 12/17/2023  PCP: Nichole Senior, MD  Patient coming from: Home by [facility]  Level of care: Telemetry  I have personally briefly reviewed patient's old medical records in [facility] Link  Chief Complaint: Fall at home   HPI: [name] is a 83 year old gentleman with atrial fibrillation on apixaban , history of CVA, type 2 diabetes mellitus, hypertension, GERD, hyperlipidemia, OSA on CPAP, who had recently been discharged from this hospital after being worked up for concern for TIA with aphasia that subsequently resolved.  He was discharged to a skilled nursing facility and after several days was discharged back to home.  He had only been home for couple of days.  Family said he had had issues with confusion and disorientation while he was at the nursing facility.  Unfortunately after being home he ended up falling off of his bed and complaining of mid to lower back pain but no loss of consciousness.  He was noted to be clinically dehydrated with dry mucous membranes and his lab work revealed an acute kidney injury with a creatinine of 2.81 where he had a creatinine of 1.00 on 11/30/2023.  He also arrived hypotensive with a blood pressure of 79/53 and MAP of 62.  He was hydrated with IV fluids via bolus in the ED with some improvement in blood pressures.  Admission was requested for further management.   Past Medical History:  Diagnosis Date   Atrial fibrillation (HCC)    Cholelithiasis 10/2011   Per CT. No cholecystitis radiographically.   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   Shortness of breath    exertion   Sleep apnea    cpap   Stroke [facility]) 2010/2013    Right Brain stroke    Past Surgical History:  Procedure Laterality Date   CARDIOVERSION  07/12/2011   Procedure: CARDIOVERSION;  Surgeon: Vinie KYM Maxcy, MD;  Location: [facility] ENDOSCOPY;  Service: Cardiovascular;  Laterality: N/A;   CAROTID DOPPLER STUDY  01/31/2012   no significant extracranial carotid artery stenosis   CATARACT EXTRACTION W/PHACO Left 01/30/2019   Procedure: CATARACT EXTRACTION PHACO AND INTRAOCULAR LENS PLACEMENT (IOC) (CDE: 4.76);  Surgeon: Harrie Agent, MD;  Location: [facility] ORS;  Service: Ophthalmology;  Laterality: Left;   COLONOSCOPY     DOPPLER ECHOCARDIOGRAPHY  01/31/2012   EF 55-60%, moderately calcified annulus of the mitral valve, left atrium demonstrated mild-moderately dilated   KNEE SURGERY     TEE WITHOUT CARDIOVERSION  07/12/2011   Procedure: TRANSESOPHAGEAL ECHOCARDIOGRAM (TEE);  Surgeon: Vinie KYM Maxcy, MD;  Location: [facility] ENDOSCOPY;  Service: Cardiovascular;  Laterality: N/A;  to be done at 1330   TONSILLECTOMY       reports that he has never smoked. He has never used smokeless tobacco. He reports current alcohol  use of about 2.0 standard drinks of alcohol  per week. He reports that he does not use drugs.  No Known Allergies  Family History  Problem Relation Age of Onset   Diabetes Mother    Diabetes Son    Pancreatic cancer Other        GRANDFATHER   Colon cancer Other  INTESTINAL, GRANDMOTHER    Prior to Admission medications   Medication Sig Start Date End Date Taking? Authorizing Provider  apixaban  (ELIQUIS ) 5 MG TABS tablet Take 1 tablet (5 mg total) by mouth 2 (two) times daily. 12/13/23   Landy Barnie RAMAN, NP  atorvastatin  (LIPITOR) 40 MG tablet Take 1 tablet (40 mg total) by mouth every evening. 12/13/23   Landy Barnie RAMAN, NP  carvedilol  (COREG ) 25 MG tablet Take 1 tablet (25 mg total) by mouth 2 (two) times daily with a meal. 12/13/23   Landy Barnie RAMAN, NP  Choline Fenofibrate  (FENOFIBRIC ACID ) 135 MG CPDR Take 135 mg by mouth  daily. 12/13/23   Landy Barnie RAMAN, NP  Continuous Glucose Sensor (FREESTYLE LIBRE 3 PLUS SENSOR) MISC Change sensor every 15 days. Patient not taking: Reported on 12/17/2023 08/31/23   Geroldine Berg, MD  escitalopram  (LEXAPRO ) 10 MG tablet Take 1 tablet (10 mg total) by mouth daily. 12/13/23   Landy Barnie RAMAN, NP  hydrocortisone  (ANUSOL -HC) 2.5 % rectal cream Apply 1 Application topically 4 (four) times daily as needed for hemorrhoids or anal itching. 11/30/23   Vicci Afton CROME, MD  Insulin  Glargine Solostar (LANTUS ) 100 UNIT/ML Solostar Pen Inject 12 Units into the skin 2 (two) times daily. 12/13/23   Landy Barnie RAMAN, NP  Insulin  lispro (HUMALOG  JUNIOR KWIKPEN) 100 UNIT/ML Inject 10 Units into the skin 3 (three) times daily. With meals 12/13/23   Landy Barnie RAMAN, NP  linagliptin  (TRADJENTA ) 5 MG TABS tablet Take 1 tablet (5 mg total) by mouth daily. 12/13/23   Landy Barnie RAMAN, NP  losartan -hydrochlorothiazide  (HYZAAR) 100-25 MG tablet Take 1 tablet by mouth daily. 12/13/23   Landy Barnie RAMAN, NP  mirabegron  ER (MYRBETRIQ ) 50 MG TB24 tablet Take 1 tablet (50 mg total) by mouth daily. 12/13/23   Landy Barnie RAMAN, NP  AISHA VERIO test strip SMARTSIG:1 Strip(s) Via Meter 5 Times Daily 01/15/19   [provider]    Physical Exam: Vitals:   12/17/23 0926 12/17/23 0930 12/17/23 1000 12/17/23 1030  BP: 101/63 (!) 103/54 97/73 105/65  Pulse: 70 69 69 76  Resp:   (!) 26 15  Temp:      TempSrc:      SpO2: 97% 97% (!) 87% 97%   Constitutional: pt somnolent but arousable but with very dry mucus membranes. NAD, calm, comfortable Eyes: PERRL, lids and conjunctivae normal ENMT: Mucous membranes are very dry.  Posterior pharynx clear of any exudate or lesions.  Neck: normal, supple, no masses, no thyromegaly Respiratory: clear to auscultation bilaterally, no wheezing, no crackles. Normal respiratory effort. No accessory muscle use.  Cardiovascular: normal s1, s2 sounds, no murmurs / rubs /  gallops. No extremity edema. 2+ pedal pulses. No carotid bruits.  Abdomen: no tenderness, no masses palpated. No hepatosplenomegaly. Bowel sounds positive.  Musculoskeletal: no clubbing / cyanosis. No joint deformity upper and lower extremities. Good ROM, no contractures. Normal muscle tone.  Skin: age related changes, no gross lesions, ulcers. No induration Neurologic: CN 2-12 grossly intact. Sensation intact, DTR normal. Strength 5/5 in all 4.  Psychiatric: UTD judgment and insight. Somnolent but arousable. Normal mood.   Labs on Admission: I have personally reviewed following labs and imaging studies  CBC: Recent Labs  Lab 12/17/23 0655  WBC 8.1  NEUTROABS 5.8  HGB 13.7  HCT 41.0  MCV 95.6  PLT 152   Basic Metabolic Panel: Recent Labs  Lab 12/17/23 0655  NA 140  K 3.7  CL 103  CO2 25  GLUCOSE 145*  BUN 71*  CREATININE 2.81*  CALCIUM  8.7*   GFR: Estimated Creatinine Clearance: 19.4 mL/min (A) (by C-G formula based on SCr of 2.81 mg/dL (H)). Liver Function Tests: Recent Labs  Lab 12/17/23 0655  AST 88*  ALT 37  ALKPHOS 43  BILITOT 2.0*  PROT 5.8*  ALBUMIN 3.3*   No results for input(s): LIPASE, AMYLASE in the last 168 hours. No results for input(s): AMMONIA in the last 168 hours. Coagulation Profile: No results for input(s): INR, PROTIME in the last 168 hours. Cardiac Enzymes: No results for input(s): CKTOTAL, CKMB, CKMBINDEX, TROPONINI in the last 168 hours. BNP (last 3 results) No results for input(s): PROBNP in the last 8760 hours. HbA1C: No results for input(s): HGBA1C in the last 72 hours. CBG: No results for input(s): GLUCAP in the last 168 hours. Lipid Profile: No results for input(s): CHOL, HDL, LDLCALC, TRIG, CHOLHDL, LDLDIRECT in the last 72 hours. Thyroid  Function Tests: No results for input(s): TSH, T4TOTAL, FREET4, T3FREE, THYROIDAB in the last 72 hours. Anemia Panel: No results for input(s):  VITAMINB12, FOLATE, FERRITIN, TIBC, IRON, RETICCTPCT in the last 72 hours. Urine analysis:    Component Value Date/Time   COLORURINE AMBER (A) 12/17/2023 1020   APPEARANCEUR HAZY (A) 12/17/2023 1020   LABSPEC 1.018 12/17/2023 1020   PHURINE 5.0 12/17/2023 1020   GLUCOSEU NEGATIVE 12/17/2023 1020   HGBUR SMALL (A) 12/17/2023 1020   BILIRUBINUR NEGATIVE 12/17/2023 1020   KETONESUR NEGATIVE 12/17/2023 1020   PROTEINUR NEGATIVE 12/17/2023 1020   UROBILINOGEN 0.2 03/13/2014 2240   NITRITE NEGATIVE 12/17/2023 1020   LEUKOCYTESUR TRACE (A) 12/17/2023 1020    Radiological Exams on Admission: CT CHEST ABDOMEN PELVIS WO CONTRAST Result Date: 12/17/2023 CLINICAL DATA:  Fall from bed.  Mid to lower back pain. EXAM: CT CHEST, ABDOMEN AND PELVIS WITHOUT CONTRAST TECHNIQUE: Multidetector CT imaging of the chest, abdomen and pelvis was performed following the standard protocol without IV contrast. RADIATION DOSE REDUCTION: This exam was performed according to the departmental dose-optimization program which includes automated exposure control, adjustment of the mA and/or kV according to patient size and/or use of iterative reconstruction technique. COMPARISON:  CT chest abdomen and pelvis dated 03/14/2014. FINDINGS: CT CHEST FINDINGS Cardiovascular: Cardiomegaly. No pleural effusion. Multivessel coronary artery calcifications. Aortic valve calcifications. Thoracic aorta is normal in caliber with atherosclerotic calcification. Mediastinum/Nodes: No enlarged mediastinal or axillary lymph nodes. Trachea and esophagus demonstrate no significant findings. Lungs/Pleura: Motion degradation slightly limits the evaluation. Dependent changes at the posterior lung bases. Regions of scarring/linear atelectasis in the right upper and middle lobes and the bilateral lung bases. Mild bronchial wall thickening. No pleural effusion or pneumothorax. Musculoskeletal: No acute fracture. Remote healed fracture of the  right posterior eighth rib. CT ABDOMEN PELVIS FINDINGS Hepatobiliary: No evidence of hepatic injury. No suspicious focal hepatic lesion identified within the limits of an unenhanced exam. Cholelithiasis. No gallbladder wall thickening or pericholecystic fluid. No biliary dilatation. Pancreas: Unremarkable. Spleen: Unremarkable. Adrenals/Urinary Tract: Adrenal glands are unremarkable. Left renal cyst, for which no follow-up imaging is recommended. No urolithiasis or hydronephrosis. Bladder is unremarkable. Stomach/Bowel: Stomach and small bowel are grossly unremarkable. No obstruction or inflammatory changes. Moderate volume of stool in the rectum. Vascular/Lymphatic: Atherosclerotic calcification of the abdominal aorta and its major branches. Nonaneurysmal abdominal aorta. No enlarged abdominopelvic lymph nodes. Reproductive: Prostate is enlarged measuring up to 7 cm in diameter. Other: No abdominopelvic ascites.  No intraperitoneal free air. Musculoskeletal: No acute osseous abnormality. Chronic  bilateral L5 pars defects with similar grade 1 anterolisthesis of L5 on S1. Multilevel degenerative changes of the thoracolumbar spine. IMPRESSION: 1. No acute traumatic findings in the chest, abdomen, or pelvis. 2. Cholelithiasis. 3. Prostatomegaly. 4. Additional nonacute findings, as described above. 5.  Aortic Atherosclerosis (ICD10-I70.0). Electronically Signed   By: Harrietta Sherry M.D.   On: 12/17/2023 09:39   CT L-SPINE NO CHARGE Result Date: 12/17/2023 CLINICAL DATA:  Mid to low back pain after falling from bed. EXAM: CT Lumbar Spine without contrast TECHNIQUE: Technique: Multiplanar CT images of the lumbar spine were reconstructed from contemporary CT of the Abdomen and Pelvis. RADIATION DOSE REDUCTION: This exam was performed according to the departmental dose-optimization program which includes automated exposure control, adjustment of the mA and/or kV according to patient size and/or use of iterative  reconstruction technique. CONTRAST:  No additional COMPARISON:  CT of the chest, abdomen and pelvis 03/14/2014. FINDINGS: Technical note: Suboptimal image quality, in part secondary to motion artifact. Segmentation: There are 5 lumbar type vertebral bodies. Alignment: Chronic grade 1 anterolisthesis at L5-S1 secondary to chronic bilateral L5 pars defects. Mild convex left scoliosis and straightening. Vertebrae: No evidence of acute fracture or traumatic subluxation. There is multilevel spondylosis with partially bridging endplate osteophytes. Partially bridging osteophytes are present at both sacroiliac joints. Paraspinal and other soft tissues: No acute paraspinal findings are identified. There is diffuse aortic and branch vessel atherosclerosis. Intra-details are dictated separately. Disc levels: Multilevel spondylosis with endplate osteophytes and facet hypertrophy. There is mild spinal stenosis and mild asymmetric narrowing of the right lateral recess and right foramen at L3-4. No significant spinal stenosis or foraminal narrowing at L4-5. At L5-S1, there is moderate chronic foraminal narrowing bilaterally due to the L5 pars defects and associated anterolisthesis. IMPRESSION: 1. No evidence of acute lumbar spine fracture, traumatic subluxation or static signs of instability. 2. 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 3. 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 4. Mild spinal stenosis and asymmetric narrowing of the right lateral recess and right foramen at L3-4. 5.  Aortic Atherosclerosis (ICD10-I70.0). Electronically Signed   By: Elsie Perone M.D.   On: 12/17/2023 09:36   CT Head Wo Contrast Result Date: 12/17/2023 CLINICAL DATA:  Patient fell out of bed.  Back pain EXAM: CT HEAD WITHOUT CONTRAST CT CERVICAL SPINE WITHOUT CONTRAST TECHNIQUE: Multidetector CT  imaging of the head and cervical spine was performed following the standard protocol without intravenous contrast. Multiplanar CT image reconstructions of the cervical spine were also generated. RADIATION DOSE REDUCTION: This exam was performed according to the departmental dose-optimization program which includes automated exposure control, adjustment of the mA and/or kV according to patient size and/or use of iterative reconstruction technique. COMPARISON:  Head CT 11/29/2023.  Cervical spine CT 08/08/2016 FINDINGS: CT HEAD FINDINGS Brain: There is no evidence for acute hemorrhage, hydrocephalus, mass lesion, or abnormal extra-axial fluid collection. No definite CT evidence for acute infarction. Diffuse loss of parenchymal volume is consistent with atrophy. Patchy low attenuation in the deep hemispheric and periventricular white matter is nonspecific, but likely reflects chronic microvascular ischemic demyelination. Old infarcts are seen in the inferior and posterior left frontal region and left occipital lobe chronic post ischemic encephalomalacia is again noted in the right basal ganglia. Vascular: No hyperdense vessel or unexpected calcification. Skull: No evidence for fracture. No worrisome lytic or sclerotic lesion. Sinuses/Orbits: Chronic mucosal disease noted in the maxillary  sinuses. Visualized portions of the globes and intraorbital fat are unremarkable. Other: None. CT CERVICAL SPINE FINDINGS Alignment: Normal. Skull base and vertebrae: No acute fracture. No primary bone lesion or focal pathologic process. Soft tissues and spinal canal: No prevertebral fluid or swelling. No visible canal hematoma. Disc levels: Diffuse loss of intervertebral disc height noted in the cervical spine from C2-3 to C6-7 with associated endplate spurring. Facets are well aligned bilaterally. Degenerative changes are noted at the C1-2 articulation. Upper chest: Unremarkable. Other: None. IMPRESSION: 1. No acute intracranial  abnormality. 2. Atrophy with chronic small vessel ischemic disease and old infarcts as above. 3. Degenerative changes in the cervical spine without fracture. Electronically Signed   By: Camellia Candle M.D.   On: 12/17/2023 09:20   CT Cervical Spine Wo Contrast Result Date: 12/17/2023 CLINICAL DATA:  Patient fell out of bed.  Back pain EXAM: CT HEAD WITHOUT CONTRAST CT CERVICAL SPINE WITHOUT CONTRAST TECHNIQUE: Multidetector CT imaging of the head and cervical spine was performed following the standard protocol without intravenous contrast. Multiplanar CT image reconstructions of the cervical spine were also generated. RADIATION DOSE REDUCTION: This exam was performed according to the departmental dose-optimization program which includes automated exposure control, adjustment of the mA and/or kV according to patient size and/or use of iterative reconstruction technique. COMPARISON:  Head CT 11/29/2023.  Cervical spine CT 08/08/2016 FINDINGS: CT HEAD FINDINGS Brain: There is no evidence for acute hemorrhage, hydrocephalus, mass lesion, or abnormal extra-axial fluid collection. No definite CT evidence for acute infarction. Diffuse loss of parenchymal volume is consistent with atrophy. Patchy low attenuation in the deep hemispheric and periventricular white matter is nonspecific, but likely reflects chronic microvascular ischemic demyelination. Old infarcts are seen in the inferior and posterior left frontal region and left occipital lobe chronic post ischemic encephalomalacia is again noted in the right basal ganglia. Vascular: No hyperdense vessel or unexpected calcification. Skull: No evidence for fracture. No worrisome lytic or sclerotic lesion. Sinuses/Orbits: Chronic mucosal disease noted in the maxillary sinuses. Visualized portions of the globes and intraorbital fat are unremarkable. Other: None. CT CERVICAL SPINE FINDINGS Alignment: Normal. Skull base and vertebrae: No acute fracture. No primary bone lesion  or focal pathologic process. Soft tissues and spinal canal: No prevertebral fluid or swelling. No visible canal hematoma. Disc levels: Diffuse loss of intervertebral disc height noted in the cervical spine from C2-3 to C6-7 with associated endplate spurring. Facets are well aligned bilaterally. Degenerative changes are noted at the C1-2 articulation. Upper chest: Unremarkable. Other: None. IMPRESSION: 1. No acute intracranial abnormality. 2. Atrophy with chronic small vessel ischemic disease and old infarcts as above. 3. Degenerative changes in the cervical spine without fracture. Electronically Signed   By: Camellia Candle M.D.   On: 12/17/2023 09:20   DG Pelvis Portable Result Date: 12/17/2023 CLINICAL DATA:  Fall off bed. EXAM: PORTABLE PELVIS 1-2 VIEWS COMPARISON:  None Available. FINDINGS: There is no evidence of pelvic fracture or diastasis. No pelvic bone lesions are seen. IMPRESSION: No acute abnormality seen. Electronically Signed   By: Lynwood Landy Raddle M.D.   On: 12/17/2023 08:15   DG Chest Portable 1 View Result Date: 12/17/2023 CLINICAL DATA:  Lower back pain after fall. EXAM: PORTABLE CHEST 1 VIEW COMPARISON:  Aug 17, 2017. FINDINGS: Stable cardiomediastinal silhouette. Left lung is clear. Stable probable right basilar scarring is noted. No definite acute pulmonary abnormality is noted. Bony thorax is unremarkable. IMPRESSION: No active disease. Electronically Signed   By: Lynwood Landy  Jr M.D.   On: 12/17/2023 08:14   EKG: Independently reviewed. Atrial fibrillation  Assessment/Plan Principal Problem:   AKI (acute kidney injury) (HCC) Active Problems:   Persistent atrial fibrillation (HCC)   DM type 2 (diabetes mellitus, type 2) (HCC)   H/O: stroke   Dehydration   Current use of long term anticoagulation   Aortic stenosis   Essential hypertension   Ascending aorta dilatation (HCC)   Hypertension associated with type 2 diabetes mellitus (HCC)   Major depression, recurrent, chronic  (HCC)   Aortic atherosclerosis (HCC)   Major neurocognitive disorder (HCC)   Orthostatic hypotension   Orthostatic hypotension -- from dehydration -- improving with IV fluid -- management with IV fluid  AKI -- prerenal as he presents very dehydrated -- check renal US  to rule out obstruction -- hydrate as above -- follow BMP  -- renally dose medicines -- temporarily hold home ARB therapy and HCT  Dehydration -- had poor oral intake while at SNF per family reporting -- IV fluid hydration and monitor Intake/Output  Persistent atrial fibrillation  -- he is managed with apixaban  for full anticoagulation  -- resume carvedilol  for heart rate control   Type 2 DM insulin  requiring -- A1c is 7.6% and this may be within goals set with his provider given age and co-morbidities -- goal to keep CBG<180 and avoid hypoglycemia -- he is on glargine and linaglipitin -- monitor CBG 5x/day  Essential hypertension  -- reducing doses of home BP meds temporarily due to presentation of hypotension   Hyperlipidemia -- resume home statin therapy    DVT prophylaxis: apixaban    Code Status: Full   Family Communication: wife/son at bedside  Disposition Plan:  TBD  Consults called:   Admission status: INP  Time spent: 57 mins Level of care: Telemetry Afton Louder MD [facility] How to contact the [facility] Attending or Consulting provider 7A - 7P or covering provider during after hours 7P -7A, for this patient?  Check the care team in [facility] and look for a) attending/consulting [facility] provider listed and b) the [facility] team listed Log into [email or web address] and use [place]'s universal password to access. If you do not have the password, please contact the hospital operator. Locate the [facility] provider you are looking for under [facility] and page to a number that you can be directly reached. If you still have difficulty reaching the provider, please page the [facility] (Director on Call) for the  Hospitalists listed on amion for assistance.   If 7PM-7AM, please contact night-coverage [email or web address] Password [facility]  12/17/2023, 12:15 PM

## 2023-12-17 NOTE — Progress Notes (Signed)
 Went in to talk to patient about using CPAP her at hospital. Patient stated he does not use one at home and is not interested in one here.  I told patient if he changes his mind to let his RN know and call us  and we would bring him one.

## 2023-12-17 NOTE — Progress Notes (Signed)
   12/17/23 2040  BiPAP/CPAP/SIPAP  BiPAP/CPAP/SIPAP Pt Type Adult  Reason BIPAP/CPAP not in use Non-compliant

## 2023-12-17 NOTE — Hospital Course (Signed)
 83 year old gentleman with atrial fibrillation on apixaban , history of CVA, type 2 diabetes mellitus, hypertension, GERD, hyperlipidemia, OSA on CPAP, who had recently been discharged from this hospital after being worked up for concern for TIA with aphasia that subsequently resolved.  He was discharged to a skilled nursing facility and after several days was discharged back to home.  He had only been home for couple of days.  Family said he had had issues with confusion and disorientation while he was at the nursing facility.  Unfortunately after being home he ended up falling off of his bed and complaining of mid to lower back pain but no loss of consciousness.  He was noted to be clinically dehydrated with dry mucous membranes and his lab work revealed an acute kidney injury with a creatinine of 2.81 where he had a creatinine of 1.00 on 11/30/2023.  He also arrived hypotensive with a blood pressure of 79/53 and MAP of 62.  He was hydrated with IV fluids via bolus in the ED with some improvement in blood pressures.  Admission was requested for further management. The patient was fluid resuscitated.  His BP improved.  His mental status also improved.  B12 and folic acid  were on the lower limit of normal.  Supplementation was started.  Renal ultrasound was negative for any hydronephrosis. Initial urinalysis showed some pyuria, 11-20 WBC.  Urine culture was not sent.  Repeat urine was collected and did grow Klebsiella.  He was started on cephalexin  after susceptibilities returned.  The patient's renal function gradually improved and returned to baseline.  CT of the chest abdomen and pelvis at the time of admission showed bibasilar atelectasis with bronchial wall thickening.  Otherwise there was no acute intra-abdominal or intrathoracic abnormalities.  X-ray of the pelvis was negative.  CT of the brain and CT of cervical spine were negative.

## 2023-12-17 NOTE — ED Triage Notes (Signed)
 Pt via [facility] c/o fall off of bed. C/o mid to lower back pain. No LOC.

## 2023-12-17 NOTE — ED Notes (Signed)
 Attempted to call spouse and wife to do screening questions unable to reach either by phone, pt attempted to answer questions

## 2023-12-18 ENCOUNTER — Inpatient Hospital Stay (HOSPITAL_COMMUNITY)

## 2023-12-18 DIAGNOSIS — F0390 Unspecified dementia without behavioral disturbance: Secondary | ICD-10-CM | POA: Diagnosis not present

## 2023-12-18 DIAGNOSIS — M25511 Pain in right shoulder: Secondary | ICD-10-CM | POA: Diagnosis not present

## 2023-12-18 DIAGNOSIS — N179 Acute kidney failure, unspecified: Secondary | ICD-10-CM | POA: Diagnosis not present

## 2023-12-18 DIAGNOSIS — M19011 Primary osteoarthritis, right shoulder: Secondary | ICD-10-CM | POA: Diagnosis not present

## 2023-12-18 DIAGNOSIS — M25711 Osteophyte, right shoulder: Secondary | ICD-10-CM | POA: Diagnosis not present

## 2023-12-18 DIAGNOSIS — I4819 Other persistent atrial fibrillation: Secondary | ICD-10-CM | POA: Diagnosis not present

## 2023-12-18 LAB — BASIC METABOLIC PANEL WITH GFR
Anion gap: 8 (ref 5–15)
BUN: 60 mg/dL — ABNORMAL HIGH (ref 8–23)
CO2: 24 mmol/L (ref 22–32)
Calcium: 8 mg/dL — ABNORMAL LOW (ref 8.9–10.3)
Chloride: 107 mmol/L (ref 98–111)
Creatinine, Ser: 1.53 mg/dL — ABNORMAL HIGH (ref 0.61–1.24)
GFR, Estimated: 45 mL/min — ABNORMAL LOW (ref >60)
Glucose, Bld: 78 mg/dL (ref 70–99)
Potassium: 3.4 mmol/L — ABNORMAL LOW (ref 3.5–5.1)
Sodium: 139 mmol/L (ref 135–145)

## 2023-12-18 LAB — URINALYSIS, COMPLETE (UACMP) WITH MICROSCOPIC
Bilirubin Urine: NEGATIVE
Glucose, UA: NEGATIVE mg/dL
Ketones, ur: NEGATIVE mg/dL
Nitrite: NEGATIVE
Protein, ur: NEGATIVE mg/dL
Specific Gravity, Urine: 1.023 (ref 1.005–1.030)
pH: 5 (ref 5.0–8.0)

## 2023-12-18 LAB — GLUCOSE, CAPILLARY
Glucose-Capillary: 67 mg/dL — ABNORMAL LOW (ref 70–99)
Glucose-Capillary: 71 mg/dL (ref 70–99)
Glucose-Capillary: 97 mg/dL (ref 70–99)
Glucose-Capillary: 172 mg/dL — ABNORMAL HIGH (ref 70–99)
Glucose-Capillary: 208 mg/dL — ABNORMAL HIGH (ref 70–99)
Glucose-Capillary: 143 mg/dL — ABNORMAL HIGH (ref 70–99)

## 2023-12-18 LAB — CBC
HCT: 37.9 % — ABNORMAL LOW (ref 39.0–52.0)
Hemoglobin: 12.6 g/dL — ABNORMAL LOW (ref 13.0–17.0)
MCH: 32.1 pg (ref 26.0–34.0)
MCHC: 33.2 g/dL (ref 30.0–36.0)
MCV: 96.4 fL (ref 80.0–100.0)
Platelets: 153 K/uL (ref 150–400)
RBC: 3.93 MIL/uL — ABNORMAL LOW (ref 4.22–5.81)
RDW: 12.9 % (ref 11.5–15.5)
WBC: 6.8 K/uL (ref 4.0–10.5)
nRBC: 0 % (ref 0.0–0.2)

## 2023-12-18 LAB — MAGNESIUM: Magnesium: 2 mg/dL (ref 1.7–2.4)

## 2023-12-18 MED ORDER — LACTATED RINGERS IV BOLUS
1000.0000 mL | Freq: Once | INTRAVENOUS | Status: AC
  Administered 2023-12-18: 1000 mL via INTRAVENOUS

## 2023-12-18 MED ORDER — FENTANYL CITRATE PF 50 MCG/ML IJ SOSY: 12.5000 ug | PREFILLED_SYRINGE | INTRAMUSCULAR | Status: DC | PRN

## 2023-12-18 MED ORDER — LACTATED RINGERS IV SOLN: INTRAVENOUS | Status: DC

## 2023-12-18 NOTE — Progress Notes (Signed)
   12/18/23 2300  BiPAP/CPAP/SIPAP  BiPAP/CPAP/SIPAP Pt Type Adult  Reason BIPAP/CPAP not in use Non-compliant

## 2023-12-18 NOTE — Plan of Care (Signed)
  Problem: Health Behavior/Discharge Planning: Goal: Ability to manage health-related needs will improve Outcome: Not Met (add Reason)   Problem: Activity: Goal: Risk for activity intolerance will decrease Outcome: Not Met (add Reason)   Problem: Nutrition: Goal: Adequate nutrition will be maintained Outcome: Progressing

## 2023-12-18 NOTE — Plan of Care (Signed)

## 2023-12-18 NOTE — NC FL2 (Signed)
 [place]  MEDICAID FL2 LEVEL OF CARE FORM     IDENTIFICATION  Patient Name: WILMORE HOLSOMBACK Birthdate: 1940/04/24 Sex: male Admission Date (Current Location): 12/17/2023  [facility] and [place] Number:  [employer] and Address:  [facility],  [address] [place] [address]      Provider Number: [id number]  Attending Physician Name and Address:  Evonnie Lenis, MD  Relative Name and Phone Number:  Percifield,Dorothy (Spouse)  [phone]    Current Level of Care: Hospital Recommended Level of Care: Skilled Nursing Facility Prior Approval Number:    Date Approved/Denied:   PASRR Number: [id number]  Discharge Plan: SNF    Current Diagnoses: Patient Active Problem List   Diagnosis Date Noted   AKI (acute kidney injury) (HCC) 12/17/2023   Orthostatic hypotension 12/17/2023   DM (diabetes mellitus), secondary, with peripheral vascular complications (HCC) 12/04/2023   Hyperlipidemia associated with type 2 diabetes mellitus (HCC) 12/03/2023   Hypertension associated with type 2 diabetes mellitus (HCC) 12/03/2023   Major depression, recurrent, chronic (HCC) 12/03/2023   OAB (overactive bladder) 12/03/2023   Aortic atherosclerosis (HCC) 12/03/2023   Major neurocognitive disorder (HCC) 12/03/2023   Aphasia 11/29/2023   TIA (transient ischemic attack) 11/29/2023   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   Essential hypertension 11/18/2017   Aortic stenosis 04/27/2014   PAF (paroxysmal atrial fibrillation) (HCC) 09/15/2013   Current use of long term anticoagulation 09/15/2013   Dehydration 10/26/2011   Persistent atrial fibrillation (HCC) 05/15/2011   DM type 2 (diabetes mellitus, type 2) (HCC) 05/15/2011   Hyperlipemia 05/15/2011   Obesity 05/15/2011   Benign hypertension 05/15/2011   H/O: stroke 05/15/2011    Orientation RESPIRATION BLADDER Height & Weight     Self, Situation, Place  Normal Continent Weight: 81.6 kg Height:  5' 4 (162.6 cm)   BEHAVIORAL SYMPTOMS/MOOD NEUROLOGICAL BOWEL NUTRITION STATUS      Continent Diet (See DC summary)  AMBULATORY STATUS COMMUNICATION OF NEEDS Skin   Extensive Assist Verbally Normal                       Personal Care Assistance Level of Assistance  Bathing, Feeding, Dressing Bathing Assistance: Maximum assistance   Dressing Assistance: Maximum assistance     Functional Limitations Info  Sight, Hearing Sight Info: Adequate Hearing Info: Adequate Speech Info: Adequate    SPECIAL CARE FACTORS FREQUENCY  PT (By licensed PT)     PT Frequency: 5 times a week OT Frequency: 5 times a week            Contractures Contractures Info: Not present    Additional Factors Info  Code Status, Allergies, Psychotropic Code Status Info: Full Allergies Info: NKDA Psychotropic Info: Lexapro          Current Medications (12/18/2023):  This is the current hospital active medication list Current Facility-Administered Medications  Medication Dose Route Frequency Provider Last Rate Last Admin   acetaminophen  (TYLENOL ) tablet 650 mg  650 mg Oral Q6H PRN Johnson, Clanford L, MD       Or   acetaminophen  (TYLENOL ) suppository 650 mg  650 mg Rectal Q6H PRN Johnson, Clanford L, MD       apixaban  (ELIQUIS ) tablet 2.5 mg  2.5 mg Oral BID Johnson, Clanford L, MD   2.5 mg at 12/18/23 0831   atorvastatin  (LIPITOR) tablet 40 mg  40 mg Oral QPM Johnson, Clanford L, MD   40 mg at 12/17/23 1814   bisacodyl  (DULCOLAX) EC tablet  5 mg  5 mg Oral Daily PRN Johnson, Clanford L, MD       escitalopram  (LEXAPRO ) tablet 10 mg  10 mg Oral Daily Johnson, Clanford L, MD   10 mg at 12/18/23 0831   fentaNYL  (SUBLIMAZE ) injection 12.5 mcg  12.5 mcg Intravenous Q2H PRN Tat, Alm, MD       insulin  aspart (novoLOG ) injection 0-9 Units  0-9 Units Subcutaneous TID WC Johnson, Clanford L, MD   2 Units at 12/18/23 1215   lactated ringers  infusion   Intravenous Continuous Tat, David, MD       mirabegron  ER (MYRBETRIQ )  tablet 50 mg  50 mg Oral Daily Johnson, Clanford L, MD   50 mg at 12/18/23 0831   ondansetron  (ZOFRAN ) tablet 4 mg  4 mg Oral Q6H PRN Johnson, Clanford L, MD       Or   ondansetron  (ZOFRAN ) injection 4 mg  4 mg Intravenous Q6H PRN Johnson, Clanford L, MD       oxyCODONE  (Oxy IR/ROXICODONE ) immediate release tablet 5 mg  5 mg Oral Q6H PRN Johnson, Clanford L, MD       traZODone  (DESYREL ) tablet 25 mg  25 mg Oral QHS PRN Johnson, Clanford L, MD         Discharge Medications: Please see discharge summary for a list of discharge medications.  Relevant Imaging Results:  Relevant Lab Results:   Additional Information SS# [id number]  Sharlyne Stabs, RN

## 2023-12-18 NOTE — Evaluation (Signed)
 Physical Therapy Evaluation Patient Details Name: [name] MRN: [id number] DOB: Mar 15, 1941 Today's Date: 12/18/2023  History of Present Illness  [name] is a 83 year old gentleman with atrial fibrillation on apixaban , history of CVA, type 2 diabetes mellitus, hypertension, GERD, hyperlipidemia, OSA on CPAP, who had recently been discharged from this hospital after being worked up for concern for TIA with aphasia that subsequently resolved.  He was discharged to a skilled nursing facility and after several days was discharged back to home.  He had only been home for couple of days.  Family said he had had issues with confusion and disorientation while he was at the nursing facility.  Unfortunately after being home he ended up falling off of his bed and complaining of mid to lower back pain but no loss of consciousness.  He was noted to be clinically dehydrated with dry mucous membranes and his lab work revealed an acute kidney injury with a creatinine of 2.81 where he had a creatinine of 1.00 on 11/30/2023.  He also arrived hypotensive with a blood pressure of 79/53 and MAP of 62.  He was hydrated with IV fluids via bolus in the ED with some improvement in blood pressures.  Admission was requested for further management.   Clinical Impression  Pt reported being agreeable to today's PT evaluation. He required significant multimodal cueing throughout today's assessment for step by step instructions. He required maxA for sit to supine transfers. However, modA was needed for sit to stand transfers and ambulation for safety. Pain and fatigue were his primary limiting factor with ambulating with the RW. Pt was left sitting in the chair with his wife, bed alarm within reach, and chair alarm set. Patient will benefit from continued skilled physical therapy in hospital and recommended venue below to increase strength, balance, endurance for safe ADLs and gait.       If plan is discharge home, recommend the  following: A lot of help with walking and/or transfers;A lot of help with bathing/dressing/bathroom;Assistance with cooking/housework;Assist for transportation;Supervision due to cognitive status;Help with stairs or ramp for entrance   Can travel by private vehicle   Yes    Equipment Recommendations None recommended by PT  Recommendations for Other Services       Functional Status Assessment Patient has had a recent decline in their functional status and demonstrates the ability to make significant improvements in function in a reasonable and predictable amount of time.     Precautions / Restrictions Precautions Precautions: Fall Recall of Precautions/Restrictions: Impaired Restrictions Weight Bearing Restrictions Per Provider Order: No      Mobility  Bed Mobility Overal bed mobility: Needs Assistance Bed Mobility: Supine to Sit     Supine to sit: Max assist     General bed mobility comments: slow and labored movement    Transfers Overall transfer level: Needs assistance Equipment used: Rolling walker (2 wheels) Transfers: Sit to/from Stand, Bed to chair/wheelchair/BSC Sit to Stand: Mod assist   Step pivot transfers: Mod assist       General transfer comment: slow and labored movement    Ambulation/Gait Ambulation/Gait assistance: Mod assist Gait Distance (Feet): 4 Feet Assistive device: Rolling walker (2 wheels) Gait Pattern/deviations: Decreased stride length, Trunk flexed Gait velocity: decreased     General Gait Details: Pt fatigued quickly with transfers and ambulation  Stairs            Wheelchair Mobility     Tilt Bed    Modified Rankin (Stroke Patients  Only)       Balance Overall balance assessment: Independent Sitting-balance support: Feet supported, No upper extremity supported Sitting balance-Leahy Scale: Fair Sitting balance - Comments: seated at EOB   Standing balance support: Bilateral upper extremity supported, During  functional activity, Reliant on assistive device for balance Standing balance-Leahy Scale: Poor Standing balance comment: using RW                             Pertinent Vitals/Pain Pain Assessment Pain Assessment: 0-10 Pain Score: 10-Worst pain ever Pain Location: low back and right shoulder Pain Descriptors / Indicators: Other (Comment) (hurts) Pain Intervention(s): Limited activity within patient's tolerance, Monitored during session, Repositioned    Home Living Family/patient expects to be discharged to:: Assisted living Living Arrangements: Spouse/significant other Available Help at Discharge: Family;Available 24 hours/day;Other (Comment) (and AFL) Type of Home: Other(Comment) (ALF)           Home Equipment: Rolling Walker (2 wheels);Cane - quad;BSC/3in1      Prior Function Prior Level of Function : Needs assist       Physical Assist : ADLs (physical)   ADLs (physical): IADLs Mobility Comments: pt's wife notes that he ambulated with a RW for community mobility ADLs Comments: reports independence with ADLs and staff at facility assist with  iADLs.     Extremity/Trunk Assessment        Lower Extremity Assessment Lower Extremity Assessment: Generalized weakness    Cervical / Trunk Assessment Cervical / Trunk Assessment: Kyphotic  Communication   Communication Communication: Impaired Factors Affecting Communication: Difficulty expressing self;Reduced clarity of speech    Cognition Arousal: Lethargic Behavior During Therapy: WFL for tasks assessed/performed   PT - Cognitive impairments: Difficult to assess Difficult to assess due to: Level of arousal                     PT - Cognition Comments: pt experienced difficulty following one step commands. Following commands: Impaired Following commands impaired: Follows one step commands inconsistently     Cueing Cueing Techniques: Verbal cues, Tactile cues, Gestural cues, Visual cues      General Comments      Exercises     Assessment/Plan    PT Assessment Patient needs continued PT services  PT Problem List Decreased strength;Decreased activity tolerance;Decreased balance;Decreased mobility;Decreased cognition;Pain       PT Treatment Interventions DME instruction;Balance training;Gait training;Functional mobility training;Therapeutic activities;Therapeutic exercise;Patient/family education    PT Goals (Current goals can be found in the Care Plan section)  Acute Rehab PT Goals Patient Stated Goal: return to his assistive living facility PT Goal Formulation: With patient/family Time For Goal Achievement: 12/25/23 Potential to Achieve Goals: Good    Frequency Min 3X/week     Co-evaluation     PT goals addressed during session: Mobility/safety with mobility;Balance         AM-PAC PT 6 Clicks Mobility  Outcome Measure Help needed turning from your back to your side while in a flat bed without using bedrails?: A Lot Help needed moving from lying on your back to sitting on the side of a flat bed without using bedrails?: A Lot Help needed moving to and from a bed to a chair (including a wheelchair)?: A Lot Help needed standing up from a chair using your arms (e.g., wheelchair or bedside chair)?: A Lot Help needed to walk in hospital room?: A Lot Help needed climbing 3-5 steps with a railing? :  Total 6 Click Score: 11    End of Session Equipment Utilized During Treatment: Gait belt Activity Tolerance: Patient limited by fatigue;Patient limited by pain Patient left: in chair;with call bell/phone within reach;with chair alarm set;with family/visitor present   PT Visit Diagnosis: History of falling (Z91.81);Muscle weakness (generalized) (M62.81);Unsteadiness on feet (R26.81)    Time: 8890-8861 PT Time Calculation (min) (ACUTE ONLY): 29 min   Charges:   PT Evaluation $PT Eval Moderate Complexity: 1 Mod PT Treatments $Therapeutic Activity: 8-22  mins PT General Charges $$ ACUTE PT VISIT: 1 Visit        Lacinda Fass, PT, DPT  12/18/2023, 11:57 AM

## 2023-12-18 NOTE — Progress Notes (Addendum)
 PROGRESS NOTE  [name] FMW:[id number] DOB: Dec 29, 1940 DOA: 12/17/2023 PCP: Nichole Senior, MD  Brief History:  83 year old gentleman with atrial fibrillation on apixaban , history of CVA, type 2 diabetes mellitus, hypertension, GERD, hyperlipidemia, OSA on CPAP, who had recently been discharged from this hospital after being worked up for concern for TIA with aphasia that subsequently resolved.  He was discharged to a skilled nursing facility and after several days was discharged back to home.  He had only been home for couple of days.  Family said he had had issues with confusion and disorientation while he was at the nursing facility.  Unfortunately after being home he ended up falling off of his bed and complaining of mid to lower back pain but no loss of consciousness.  He was noted to be clinically dehydrated with dry mucous membranes and his lab work revealed an acute kidney injury with a creatinine of 2.81 where he had a creatinine of 1.00 on 11/30/2023.  He also arrived hypotensive with a blood pressure of 79/53 and MAP of 62.  He was hydrated with IV fluids via bolus in the ED with some improvement in blood pressures.  Admission was requested for further management.   Assessment/Plan: Orthostatic hypotension -- improving with IV fluid -- 9/17--repeat bolus - continue IVF   AKI -- due to volume depletion - baseline creatinine 1.0-1.2 - presented with serum creatinine 2.81 -- check renal US --neg hydronephrosis -- continue IVF>>improving -- renally dose medicines -- temporarily hold home ARB therapy and HCT   Persistent atrial fibrillation  -- continue apixaban  -- change coreg  to metoprolol  for HR control   Type 2 DM insulin  requiring -- 11/29/23 A1c is 7.6%  -- goal to keep CBG<180 and avoid hypoglycemia -- he is on glargine and linaglipitin at home -- novolog  sliding scale   Essential hypertension  -- reducing doses of home BP meds temporarily due to  hypotension - hold losartan /HCTZ   Hyperlipidemia -- resume home statin therapy   History of CVA:  -Not on aspirin  due to Eliquis  use, continue simvastatin 80 mg nightly.   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 -outpt ortho referral      Family Communication:   wife at bedside 9/17  Consultants:  none  Code Status:  FULL   DVT Prophylaxis:  apixaban    Procedures: As Listed in Progress Note Above  Antibiotics: None     Subjective: Patient denies fevers, chills, headache, chest pain, dyspnea, nausea, vomiting, diarrhea, abdominal pain, dysuria, hematuria, hematochezia, and melena.   Objective: Vitals:   12/17/23 1954 12/17/23 2002 12/18/23 0500 12/18/23 1326  BP:  (!) 106/46 (!) 110/54 (!) 93/58  Pulse:  64 77 95  Resp:  16 17 19   Temp:  98.6 F (37 C) 98.2 F (36.8 C) 98.2 F (36.8 C)  TempSrc:  Axillary Oral   SpO2:  98% 98% 98%  Weight:  81.6 kg    Height: 5' 4 (1.626 m)       Intake/Output Summary (Last 24 hours) at 12/18/2023 1420 Last data filed at 12/18/2023 1300 Gross per 24 hour  Intake 1481.25 ml  Output 500 ml  Net 981.25 ml   Weight change:  Exam:  General:  Pt is alert, follows commands appropriately, not in acute distress HEENT: No icterus, No  thrush, No neck mass, [place]/AT Cardiovascular: RRR, S1/S2, no rubs, no gallops Respiratory: CTA bilaterally, no wheezing, no crackles, no rhonchi Abdomen: Soft/+BS, non tender, non distended, no guarding Extremities: No edema, No lymphangitis, No petechiae, No rashes, no synovitis   Data Reviewed: I have personally reviewed following labs and imaging studies Basic Metabolic Panel: Recent Labs  Lab 12/17/23 0655 12/18/23 0429  NA 140 139  K 3.7 3.4*  CL 103 107  CO2 25 24  GLUCOSE 145* 78  BUN 71* 60*   CREATININE 2.81* 1.53*  CALCIUM  8.7* 8.0*  MG  --  2.0   Liver Function Tests: Recent Labs  Lab 12/17/23 0655  AST 88*  ALT 37  ALKPHOS 43  BILITOT 2.0*  PROT 5.8*  ALBUMIN 3.3*   No results for input(s): LIPASE, AMYLASE in the last 168 hours. No results for input(s): AMMONIA in the last 168 hours. Coagulation Profile: No results for input(s): INR, PROTIME in the last 168 hours. CBC: Recent Labs  Lab 12/17/23 0655 12/18/23 0429  WBC 8.1 6.8  NEUTROABS 5.8  --   HGB 13.7 12.6*  HCT 41.0 37.9*  MCV 95.6 96.4  PLT 152 153   Cardiac Enzymes: No results for input(s): CKTOTAL, CKMB, CKMBINDEX, TROPONINI in the last 168 hours. BNP: Invalid input(s): POCBNP CBG: Recent Labs  Lab 12/17/23 2157 12/18/23 0345 12/18/23 0405 12/18/23 0719 12/18/23 1130  GLUCAP 71 67* 71 97 172*   HbA1C: No results for input(s): HGBA1C in the last 72 hours. Urine analysis:    Component Value Date/Time   COLORURINE AMBER (A) 12/17/2023 1020   APPEARANCEUR HAZY (A) 12/17/2023 1020   LABSPEC 1.018 12/17/2023 1020   PHURINE 5.0 12/17/2023 1020   GLUCOSEU NEGATIVE 12/17/2023 1020   HGBUR SMALL (A) 12/17/2023 1020   BILIRUBINUR NEGATIVE 12/17/2023 1020   KETONESUR NEGATIVE 12/17/2023 1020   PROTEINUR NEGATIVE 12/17/2023 1020   UROBILINOGEN 0.2 03/13/2014 2240   NITRITE NEGATIVE 12/17/2023 1020   LEUKOCYTESUR TRACE (A) 12/17/2023 1020   Sepsis Labs: @LABRCNTIP (procalcitonin:4,lacticidven:4) ) Recent Results (from the past 240 hours)  Culture, blood (routine x 2)     Status: None (Preliminary result)   Collection Time: 12/17/23  7:36 AM   Specimen: BLOOD  Result Value Ref Range Status   Specimen Description BLOOD BLOOD LEFT HAND  Final   Special Requests   Final    Blood Culture results may not be optimal due to an inadequate volume of blood received in culture bottles BOTTLES DRAWN AEROBIC ONLY   Culture   Final    NO GROWTH 1 DAY Performed at [facility], [address] [place], [place] [address]    Report Status PENDING  Incomplete  Culture, blood (routine x 2)     Status: None (Preliminary result)   Collection Time: 12/17/23  7:37 AM   Specimen: BLOOD  Result Value Ref Range Status   Specimen Description BLOOD BLOOD LEFT HAND  Final   Special Requests   Final    Blood Culture results may not be optimal due to an inadequate volume of blood received in culture bottles BOTTLES DRAWN AEROBIC ONLY   Culture   Final    NO GROWTH 1 DAY Performed at [facility], [address] [place], [place] [address]    Report Status PENDING  Incomplete     Scheduled Meds:  apixaban   2.5 mg Oral BID   atorvastatin   40 mg Oral QPM   carvedilol   6.25 mg Oral BID WC  escitalopram   10 mg Oral Daily   insulin  aspart  0-9 Units Subcutaneous TID WC   linagliptin   5 mg Oral Daily   mirabegron  ER  50 mg Oral Daily   Continuous Infusions:  Procedures/Studies: DG Shoulder Right Result Date: 12/18/2023 EXAM: 1 VIEW XRAY OF THE RIGHT SHOULDER 12/18/2023 10:50:00 AM COMPARISON: None available. CLINICAL HISTORY: Right shoulder pain after fall yesterday FINDINGS: BONES AND JOINTS: Normal alignment of acromioclavicular joint with moderate degenerative changes. Osteophytes of the humeral head and glenoid. High riding humeral head with loss of acromiohumeral distance, likely representing underlying rotator cuff tear. SOFT TISSUES: Vascular calcifications present. IMPRESSION: 1. High riding humeral head with loss of acromiohumeral distance, likely representing underlying rotator cuff tear. 2. Moderate degenerative changes in the acromioclavicular and glenohumeral joints. Electronically signed by: Donnice Mania MD 12/18/2023 12:32 PM EDT RP Workstation: [id number]   US  RENAL Result Date: 12/17/2023 CLINICAL DATA:  Acute kidney injury. EXAM: RENAL / URINARY TRACT ULTRASOUND COMPLETE COMPARISON:  None Available. FINDINGS: Right Kidney: Renal measurements: 11.8 cm x 6.4 cm  x 5.5 cm = volume: 217.2 mL. Echogenicity within normal limits. No mass or hydronephrosis visualized. Left Kidney: Renal measurements: 12.4 cm x 6.5 cm x 4.7 cm = volume: 236.9 mL. Echogenicity within normal limits. 2.3 cm x 2.1 cm x 2.3 cm and 2.0 cm x 1.8 cm x 1.8 cm simple cysts are seen within the left kidney. No hydronephrosis is visualized. Bladder: Appears normal for degree of bladder distention. Other: The prostate gland measures 4.8 cm x 0.4 cm x 5.2 cm (volume 57.1 mL). IMPRESSION: 1. Simple left renal cysts. 2. Enlarged prostate gland. Correlation with PSA levels is recommended. Electronically Signed   By: Suzen Dials M.D.   On: 12/17/2023 15:06   CT CHEST ABDOMEN PELVIS WO CONTRAST Result Date: 12/17/2023 CLINICAL DATA:  Fall from bed.  Mid to lower back pain. EXAM: CT CHEST, ABDOMEN AND PELVIS WITHOUT CONTRAST TECHNIQUE: Multidetector CT imaging of the chest, abdomen and pelvis was performed following the standard protocol without IV contrast. RADIATION DOSE REDUCTION: This exam was performed according to the departmental dose-optimization program which includes automated exposure control, adjustment of the mA and/or kV according to patient size and/or use of iterative reconstruction technique. COMPARISON:  CT chest abdomen and pelvis dated 03/14/2014. FINDINGS: CT CHEST FINDINGS Cardiovascular: Cardiomegaly. No pleural effusion. Multivessel coronary artery calcifications. Aortic valve calcifications. Thoracic aorta is normal in caliber with atherosclerotic calcification. Mediastinum/Nodes: No enlarged mediastinal or axillary lymph nodes. Trachea and esophagus demonstrate no significant findings. Lungs/Pleura: Motion degradation slightly limits the evaluation. Dependent changes at the posterior lung bases. Regions of scarring/linear atelectasis in the right upper and middle lobes and the bilateral lung bases. Mild bronchial wall thickening. No pleural effusion or pneumothorax. Musculoskeletal:  No acute fracture. Remote healed fracture of the right posterior eighth rib. CT ABDOMEN PELVIS FINDINGS Hepatobiliary: No evidence of hepatic injury. No suspicious focal hepatic lesion identified within the limits of an unenhanced exam. Cholelithiasis. No gallbladder wall thickening or pericholecystic fluid. No biliary dilatation. Pancreas: Unremarkable. Spleen: Unremarkable. Adrenals/Urinary Tract: Adrenal glands are unremarkable. Left renal cyst, for which no follow-up imaging is recommended. No urolithiasis or hydronephrosis. Bladder is unremarkable. Stomach/Bowel: Stomach and small bowel are grossly unremarkable. No obstruction or inflammatory changes. Moderate volume of stool in the rectum. Vascular/Lymphatic: Atherosclerotic calcification of the abdominal aorta and its major branches. Nonaneurysmal abdominal aorta. No enlarged abdominopelvic lymph nodes. Reproductive: Prostate is enlarged measuring up to 7 cm in diameter. Other: No  abdominopelvic ascites.  No intraperitoneal free air. Musculoskeletal: No acute osseous abnormality. 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 Multilevel degenerative changes of the thoracolumbar spine. IMPRESSION: 1. No acute traumatic findings in the chest, abdomen, or pelvis. 2. Cholelithiasis. 3. Prostatomegaly. 4. Additional nonacute findings, as described above. 5.  Aortic Atherosclerosis (ICD10-I70.0). Electronically Signed   By: Harrietta Sherry M.D.   On: 12/17/2023 09:39   CT L-SPINE NO CHARGE Result Date: 12/17/2023 CLINICAL DATA:  Mid to low back pain after falling from bed. EXAM: CT Lumbar Spine without contrast TECHNIQUE: Technique: Multiplanar CT images of the lumbar spine were reconstructed from contemporary CT of the Abdomen and Pelvis. RADIATION DOSE REDUCTION: This exam was performed according to the departmental dose-optimization program which includes automated exposure control, adjustment of the mA and/or kV  according to patient size and/or use of iterative reconstruction technique. CONTRAST:  No additional COMPARISON:  CT of the chest, abdomen and pelvis 03/14/2014. FINDINGS: Technical note: Suboptimal image quality, in part secondary to motion artifact. Segmentation: There are 5 lumbar type vertebral bodies. Alignment: Chronic grade 1 anterolisthesis at L5-S1 secondary to chronic bilateral L5 pars defects. Mild convex left scoliosis and straightening. Vertebrae: No evidence of acute fracture or traumatic subluxation. There is multilevel spondylosis with partially bridging endplate osteophytes. Partially bridging osteophytes are present at both sacroiliac joints. Paraspinal and other soft tissues: No acute paraspinal findings are identified. There is diffuse aortic and branch vessel atherosclerosis. Intra-details are dictated separately. Disc levels: Multilevel spondylosis with endplate osteophytes and facet hypertrophy. There is mild spinal stenosis and mild asymmetric narrowing of the right lateral recess and right foramen at L3-4. No significant spinal stenosis or foraminal narrowing at L4-5. At L5-S1, there is moderate chronic foraminal narrowing bilaterally due to the L5 pars defects and associated anterolisthesis. IMPRESSION: 1. No evidence of acute lumbar spine fracture, traumatic subluxation or static signs of instability. 2. 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 3. 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 4. Mild spinal stenosis and asymmetric narrowing of the right lateral recess and right foramen at L3-4. 5.  Aortic Atherosclerosis (ICD10-I70.0). Electronically Signed   By: Elsie Perone M.D.   On: 12/17/2023 09:36   CT Head Wo Contrast Result Date: 12/17/2023 CLINICAL DATA:  Patient fell out of bed.  Back pain EXAM: CT HEAD WITHOUT CONTRAST CT CERVICAL SPINE  WITHOUT CONTRAST TECHNIQUE: Multidetector CT imaging of the head and cervical spine was performed following the standard protocol without intravenous contrast. Multiplanar CT image reconstructions of the cervical spine were also generated. RADIATION DOSE REDUCTION: This exam was performed according to the departmental dose-optimization program which includes automated exposure control, adjustment of the mA and/or kV according to patient size and/or use of iterative reconstruction technique. COMPARISON:  Head CT 11/29/2023.  Cervical spine CT 08/08/2016 FINDINGS: CT HEAD FINDINGS Brain: There is no evidence for acute hemorrhage, hydrocephalus, mass lesion, or abnormal extra-axial fluid collection. No definite CT evidence for acute infarction. Diffuse loss of parenchymal volume is consistent with atrophy. Patchy low attenuation in the deep hemispheric and periventricular white matter is nonspecific, but likely reflects chronic microvascular ischemic demyelination. Old infarcts are seen in the inferior and posterior left frontal region and left occipital lobe chronic post ischemic encephalomalacia is again noted in the right basal ganglia. Vascular: No hyperdense vessel or unexpected calcification. Skull: No evidence for fracture. No  worrisome lytic or sclerotic lesion. Sinuses/Orbits: Chronic mucosal disease noted in the maxillary sinuses. Visualized portions of the globes and intraorbital fat are unremarkable. Other: None. CT CERVICAL SPINE FINDINGS Alignment: Normal. Skull base and vertebrae: No acute fracture. No primary bone lesion or focal pathologic process. Soft tissues and spinal canal: No prevertebral fluid or swelling. No visible canal hematoma. Disc levels: Diffuse loss of intervertebral disc height noted in the cervical spine from C2-3 to C6-7 with associated endplate spurring. Facets are well aligned bilaterally. Degenerative changes are noted at the C1-2 articulation. Upper chest: Unremarkable. Other:  None. IMPRESSION: 1. No acute intracranial abnormality. 2. Atrophy with chronic small vessel ischemic disease and old infarcts as above. 3. Degenerative changes in the cervical spine without fracture. Electronically Signed   By: Camellia Candle M.D.   On: 12/17/2023 09:20   CT Cervical Spine Wo Contrast Result Date: 12/17/2023 CLINICAL DATA:  Patient fell out of bed.  Back pain EXAM: CT HEAD WITHOUT CONTRAST CT CERVICAL SPINE WITHOUT CONTRAST TECHNIQUE: Multidetector CT imaging of the head and cervical spine was performed following the standard protocol without intravenous contrast. Multiplanar CT image reconstructions of the cervical spine were also generated. RADIATION DOSE REDUCTION: This exam was performed according to the departmental dose-optimization program which includes automated exposure control, adjustment of the mA and/or kV according to patient size and/or use of iterative reconstruction technique. COMPARISON:  Head CT 11/29/2023.  Cervical spine CT 08/08/2016 FINDINGS: CT HEAD FINDINGS Brain: There is no evidence for acute hemorrhage, hydrocephalus, mass lesion, or abnormal extra-axial fluid collection. No definite CT evidence for acute infarction. Diffuse loss of parenchymal volume is consistent with atrophy. Patchy low attenuation in the deep hemispheric and periventricular white matter is nonspecific, but likely reflects chronic microvascular ischemic demyelination. Old infarcts are seen in the inferior and posterior left frontal region and left occipital lobe chronic post ischemic encephalomalacia is again noted in the right basal ganglia. Vascular: No hyperdense vessel or unexpected calcification. Skull: No evidence for fracture. No worrisome lytic or sclerotic lesion. Sinuses/Orbits: Chronic mucosal disease noted in the maxillary sinuses. Visualized portions of the globes and intraorbital fat are unremarkable. Other: None. CT CERVICAL SPINE FINDINGS Alignment: Normal. Skull base and vertebrae:  No acute fracture. No primary bone lesion or focal pathologic process. Soft tissues and spinal canal: No prevertebral fluid or swelling. No visible canal hematoma. Disc levels: Diffuse loss of intervertebral disc height noted in the cervical spine from C2-3 to C6-7 with associated endplate spurring. Facets are well aligned bilaterally. Degenerative changes are noted at the C1-2 articulation. Upper chest: Unremarkable. Other: None. IMPRESSION: 1. No acute intracranial abnormality. 2. Atrophy with chronic small vessel ischemic disease and old infarcts as above. 3. Degenerative changes in the cervical spine without fracture. Electronically Signed   By: Camellia Candle M.D.   On: 12/17/2023 09:20   DG Pelvis Portable Result Date: 12/17/2023 CLINICAL DATA:  Fall off bed. EXAM: PORTABLE PELVIS 1-2 VIEWS COMPARISON:  None Available. FINDINGS: There is no evidence of pelvic fracture or diastasis. No pelvic bone lesions are seen. IMPRESSION: No acute abnormality seen. Electronically Signed   By: Lynwood Landy Raddle M.D.   On: 12/17/2023 08:15   DG Chest Portable 1 View Result Date: 12/17/2023 CLINICAL DATA:  Lower back pain after fall. EXAM: PORTABLE CHEST 1 VIEW COMPARISON:  Aug 17, 2017. FINDINGS: Stable cardiomediastinal silhouette. Left lung is clear. Stable probable right basilar scarring is noted. No definite acute pulmonary abnormality is noted. Bony thorax is  unremarkable. IMPRESSION: No active disease. Electronically Signed   By: Lynwood Landy Raddle M.D.   On: 12/17/2023 08:14   ECHOCARDIOGRAM COMPLETE Result Date: 11/29/2023    ECHOCARDIOGRAM REPORT   Patient Name:   [name] Date of Exam: 11/29/2023 Medical Rec #:  [id number]    Height:       68.0 in Accession #:    [id number]   Weight:       181.9 lb Date of Birth:  July 07, 1940    BSA:          1.963 m Patient Age:    83 years     BP:           142/66 mmHg Patient Gender: M            HR:           73 bpm. Exam Location:  [facility] Procedure: 2D Echo, Cardiac  Doppler, Color Doppler and Saline Contrast Bubble            Study (Both Spectral and Color Flow Doppler were utilized during            procedure). Indications:    Stroke l63.9  History:        Patient has prior history of Echocardiogram examinations, most                 recent 03/14/2023. Arrythmias:Atrial Fibrillation; Risk                 Factors:Hypertension, Diabetes and Sleep Apnea.  Sonographer:    Aida Pizza RCS Referring Phys: [id number] TIMOTHY S OPYD IMPRESSIONS  1. Left ventricular ejection fraction, by estimation, is 60 to 65%. The left ventricle has normal function. The left ventricle has no regional wall motion abnormalities. There is mild left ventricular hypertrophy. Left ventricular diastolic function could not be evaluated.  2. Right ventricular systolic function is normal. The right ventricular size is normal. There is mildly elevated pulmonary artery systolic pressure. The estimated right ventricular systolic pressure is 43.5 mmHg.  3. Left atrial size was severely dilated.  4. Right atrial size was severely dilated.  5. The mitral valve is abnormal. Mild mitral valve regurgitation. No evidence of mitral stenosis. Moderate mitral annular calcification.  6. The tricuspid valve is abnormal. Tricuspid valve regurgitation is moderate to severe.  7. The aortic valve is calcified. There is severe calcifcation of the aortic valve. There is moderate thickening of the aortic valve. Aortic valve regurgitation is moderate. Mild to moderate aortic valve stenosis. Aortic regurgitation PHT measures 692 msec. Aortic valve area, by VTI measures 1.21 cm. Aortic valve mean gradient measures 10.5 mmHg. Aortic valve Vmax measures 2.33 m/s. DVI is 0.31.  8. The inferior vena cava is dilated in size with >50% respiratory variability, suggesting right atrial pressure of 8 mmHg.  9. Agitated saline contrast bubble study was negative, with no evidence of any interatrial shunt. FINDINGS  Left Ventricle: Left  ventricular ejection fraction, by estimation, is 60 to 65%. The left ventricle has normal function. The left ventricle has no regional wall motion abnormalities. Strain was performed and the global longitudinal strain is indeterminate. The left ventricular internal cavity size was normal in size. There is mild left ventricular hypertrophy. Left ventricular diastolic function could not be evaluated due to atrial fibrillation. Left ventricular diastolic function could not be evaluated. Right Ventricle: The right ventricular size is normal. No increase in right ventricular wall thickness. Right ventricular systolic function is normal.  There is mildly elevated pulmonary artery systolic pressure. The tricuspid regurgitant velocity is 2.98  m/s, and with an assumed right atrial pressure of 8 mmHg, the estimated right ventricular systolic pressure is 43.5 mmHg. Left Atrium: Left atrial size was severely dilated. Right Atrium: Right atrial size was severely dilated. Pericardium: There is no evidence of pericardial effusion. Mitral Valve: The mitral valve is abnormal. Moderate mitral annular calcification. Mild mitral valve regurgitation. No evidence of mitral valve stenosis. Tricuspid Valve: The tricuspid valve is abnormal. Tricuspid valve regurgitation is moderate to severe. No evidence of tricuspid stenosis. Aortic Valve: The aortic valve is calcified. There is severe calcifcation of the aortic valve. There is moderate thickening of the aortic valve. Aortic valve regurgitation is moderate. Aortic regurgitation PHT measures 692 msec. Mild to moderate aortic stenosis is present. Aortic valve mean gradient measures 10.5 mmHg. Aortic valve peak gradient measures 21.7 mmHg. Aortic valve area, by VTI measures 1.21 cm. Pulmonic Valve: The pulmonic valve was thickened with good excursion. Pulmonic valve regurgitation is trivial. No evidence of pulmonic stenosis. Aorta: The aortic root is normal in size and structure. Venous: The  inferior vena cava is dilated in size with greater than 50% respiratory variability, suggesting right atrial pressure of 8 mmHg. IAS/Shunts: No atrial level shunt detected by color flow Doppler. Agitated saline contrast was given intravenously to evaluate for intracardiac shunting. Agitated saline contrast bubble study was negative, with no evidence of any interatrial shunt. Additional Comments: 3D was performed not requiring image post processing on an independent workstation and was indeterminate.  LEFT VENTRICLE PLAX 2D LVIDd:         4.40 cm LVIDs:         2.50 cm LV PW:         1.10 cm LV IVS:        1.20 cm LVOT diam:     2.20 cm LV SV:         55 LV SV Index:   28 LVOT Area:     3.80 cm  RIGHT VENTRICLE TAPSE (M-mode): 2.0 cm LEFT ATRIUM             Index        RIGHT ATRIUM           Index LA diam:        5.00 cm 2.55 cm/m   RA Area:     27.20 cm LA Vol (A2C):   85.3 ml 43.46 ml/m  RA Volume:   80.80 ml  41.16 ml/m LA Vol (A4C):   95.0 ml 48.40 ml/m LA Biplane Vol: 92.9 ml 47.33 ml/m  AORTIC VALVE AV Area (Vmax):    1.18 cm AV Area (Vmean):   1.24 cm AV Area (VTI):     1.21 cm AV Vmax:           233.00 cm/s AV Vmean:          141.500 cm/s AV VTI:            0.457 m AV Peak Grad:      21.7 mmHg AV Mean Grad:      10.5 mmHg LVOT Vmax:         72.40 cm/s LVOT Vmean:        46.000 cm/s LVOT VTI:          0.146 m LVOT/AV VTI ratio: 0.32 AI PHT:            692 msec  AORTA Ao Root diam: 3.70  cm MITRAL VALVE               TRICUSPID VALVE MV Area (PHT): 3.12 cm    TR Peak grad:   35.5 mmHg MV Decel Time: 243 msec    TR Vmax:        298.00 cm/s MV E velocity: 73.30 cm/s                            SHUNTS                            Systemic VTI:  0.15 m                            Systemic Diam: 2.20 cm Vishnu Priya Mallipeddi Electronically signed by Diannah Late Mallipeddi Signature Date/Time: 11/29/2023/3:55:07 PM    Final    MR BRAIN WO CONTRAST Result Date: 11/29/2023 EXAM: MR Brain without Intravenous  Contrast. CLINICAL HISTORY: 83 year old male with type 2 diabetes, persistent atrial fibrillation, and hypertension, presenting with acute neuro deficit and suspected stroke. Last seen normal at 10 PM, found on the floor confused. Symptoms improving upon arrival. TECHNIQUE: Magnetic resonance images of the brain without intravenous contrast in multiple planes. CONTRAST: Without; COMPARISON: MRI brain 06/14/2022, CTA head and neck 11/29/2023 FINDINGS: BRAIN: No evidence of acute infarct within the limits of patient motion artifact. 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 Background of severe chronic small vessel disease. Tortuosity of the vertebral basilar system with diminished flow voids on T2-weighted imaging likely due to in-plane flow. No intracranial mass or hemorrhage. No midline shift or extra-axial fluid collection. No cerebellar tonsillar ectopia. VENTRICLES: No hydrocephalus. ORBITS: The orbits are normal. SINUSES AND MASTOIDS: The sinuses and mastoid air cells are clear. BONES: No acute fracture or focal osseous lesion. IMPRESSION: 1. No evidence of acute infarct within limits of motion artifact. 2. 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 3. Severe chronic small vessel disease. Electronically signed by: Ryan Chess MD 11/29/2023 11:28 AM EDT RP Workstation: [id number]   CT ANGIO HEAD NECK W WO CM W PERF (CODE STROKE) Result Date: 11/29/2023 CLINICAL DATA:  83 year old male neurologic deficit, found down fallen out of bed. Slurred speech and weakness. EXAM: CT ANGIOGRAPHY HEAD AND NECK CT PERFUSION BRAIN TECHNIQUE: Multidetector CT imaging of the head and neck was performed using the standard protocol during bolus administration of intravenous contrast. Multiplanar CT image reconstructions and MIPs were obtained to evaluate the vascular anatomy. Carotid stenosis measurements  (when applicable) are obtained utilizing NASCET criteria, using the distal internal carotid diameter as the denominator. Multiphase CT imaging of the brain was performed following IV bolus contrast injection. Subsequent parametric perfusion maps were calculated using RAPID software. RADIATION DOSE REDUCTION: This exam was performed according to the departmental dose-optimization program which includes automated exposure control, adjustment of the mA and/or kV according to patient size and/or use of iterative reconstruction technique. CONTRAST:  OMNIPAQUE  IOHEXOL  350 MG/ML SOLN COMPARISON:  Plain head CT 0318 hours today.  Brain MRI 06/14/2022. FINDINGS: CT Brain Perfusion Findings: ASPECTS: 10 CBF (<30%) Volume: 0mL. There is a small area of less stringent CBF, but not CBV, abnormality in the anterior left MCA territory cortex. Perfusion (Tmax>6.0s) volume: 82mL, but widespread in the  bilateral cerebral white matter and likely artifactual. Questionable more pronounced oligemia anterior left MCA territory cortex. Mismatch Volume: Unclear due to suspected T-max artifact. Infarction Location:No infarct core detected. CTA NECK Skeleton: Pronounced Diffuse idiopathic skeletal hyperostosis (DISH). Associated widespread cervical and visible upper thoracic interbody ankylosis from bulky flowing endplate osteophytes. No acute osseous abnormality identified. Upper chest: Negative. Other neck: Nonvascular neck soft tissue spaces are within normal limits. Aortic arch: Calcified aortic atherosclerosis. Tortuous arch. Three vessel arch. Right carotid system: Brachiocephalic artery and right CCA origin plaque without stenosis. Tortuous right CCA with widespread but mild calcified plaque. No stenosis to the bifurcation. Moderate calcified plaque at the bifurcation. Tortuous right ICA distal to the bulb. No stenosis to the skull base. Left carotid system: Similar tortuosity and widespread mild calcified plaque. Moderate  calcified plaque at the bifurcation, left ICA origin and bulb. No stenosis results. Vertebral arteries: Proximal right subclavian artery atherosclerosis without significant stenosis. Heavily calcified right vertebral artery origin and proximal V1 segment with severe stenosis on series 7, image 179. The vessel remains patent. No additional plaque or stenosis to the skull base. Proximal left subclavian artery soft and calcified atherosclerosis without stenosis. Calcified plaque at the left vertebral artery origin resulting in moderate to severe stenosis series 7, image 177. Codominant left vertebral artery remains patent with mild additional V3 segment calcified plaque, no stenosis to the skull base. CTA HEAD Posterior circulation: Tortuous distal vertebral arteries and vertebrobasilar junction with abundant calcified plaque, especially in the left V4 segment. Mild-to-moderate left V4 stenosis. Patent PICA origins. No significant right V4 stenosis. Mild vertebrobasilar junction stenosis. Tortuous and heavily calcified basilar artery (series 6, image 124) with no significant basilar stenosis. Patent SCA and PCA origins with calcified plaque. No high-grade origin stenosis. Posterior communicating arteries are diminutive or absent. Tandem severe stenoses of the left PCA P1 and P2 segments (series 11, image 23). Similar severe stenosis at the right PCA P1/P2 junction. Anterior circulation: Both ICA siphons are patent and tortuous. Mild siphon plaque in the petrous segment. Widespread and moderate calcified plaque beginning in the cavernous segment. Moderate distal supraclinoid left ICA stenosis (series 8, image 112). Similar right siphon plaque and moderate distal right ICA supraclinoid stenosis (series 6, image 109). Patent carotid termini, MCA and ACA origins. No high-grade origin stenosis. Diminutive or absent anterior communicating artery. Bilateral ACA branches are patent with mild irregularity. Left MCA M1 segment  and bifurcation are patent. Right MCA mild proximal right M1 stenosis (series 2, image 20). Right M1 and right MCA bifurcation remain patent. Mild to moderate bilateral MCA M2 and distal branch irregularity. No complete branch occlusion is identified. Venous sinuses: Early contrast timing, not well evaluated. Anatomic variants: None. Review of the MIP images confirms the above findings IMPRESSION: 1. CTA is negative for large vessel occlusion, but Positive for Extensive Atherosclerosis throughout the head and neck. 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 - Moderate stenosis both distal supraclinoid ICAs. - Moderate to Severe stenosis bilateral PCA P1 and P2 segments. 2. CT Perfusion affected by artifact. Questionable oligemia in the anterior Left MCA division. 3.  Aortic Atherosclerosis (ICD10-I70.0). 4. 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 Electronically Signed   By: VEAR Hurst M.D.   On: 11/29/2023 04:11   CT HEAD CODE STROKE WO CONTRAST` Result Date: 11/29/2023 CLINICAL DATA:  Code stroke. Initial evaluation for acute neuro deficit, slurred speech, altered mental status. EXAM: CT HEAD  WITHOUT CONTRAST TECHNIQUE: Contiguous axial images were obtained from the base of the skull through the vertex without intravenous contrast. RADIATION DOSE REDUCTION: This exam was performed according to the departmental dose-optimization program which includes automated exposure control, adjustment of the mA and/or kV according to patient size and/or use of iterative reconstruction technique. COMPARISON:  Prior study from 06/14/2022 FINDINGS: Brain: Generalized age-related cerebral atrophy. Patchy and confluent hypodensity involving the supratentorial cerebral white matter, consistent with chronic small vessel ischemic disease, advanced in nature. Multiple scattered remote infarcts involving the left frontal lobe,  left occipital lobe, and right basal ganglia/thalamus. No acute intracranial hemorrhage. No acute large vessel territory infarct. No mass lesion or midline shift. Mild ventricular prominence related global parenchymal volume loss of hydrocephalus. No convincing extra-axial fluid collection. Vascular: No abnormal hyperdense vessel. Extensive calcified atherosclerosis present at the skull base. Skull: Scalp soft tissues demonstrate no acute finding. Appear intact. Sinuses/Orbits: Globes orbital soft tissues within normal limits. Mild mucosal thickening present about the maxillary sinuses. Paranasal sinuses are otherwise clear. Trace right mastoid effusion noted, of doubtful significance. Other: None. ASPECTS [facility] Stroke Program Early CT Score) - Ganglionic level infarction (caudate, lentiform nuclei, internal capsule, insula, M1-M3 cortex): 7 - Supraganglionic infarction (M4-M6 cortex): 3 Total score (0-10 with 10 being normal): 10 IMPRESSION: 1. No acute intracranial abnormality. 2. Aspects is 10. 3. 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 Results were called by telephone at the time of interpretation on 11/29/2023 at 3:33 am to provider VICENTA ABLE , who verbally acknowledged these results. Electronically Signed   By: Morene Hoard M.D.   On: 11/29/2023 03:33    Alm Schneider, DO  [facility]  If 7PM-7AM, please contact night-coverage [email or web address] Password [email or web address] 12/18/2023, 2:20 PM   LOS: 1 day

## 2023-12-18 NOTE — Progress Notes (Signed)
 Mobility Specialist Progress Note:    12/18/23 1355  Mobility  Activity Pivoted/transferred from chair to bed  Level of Assistance Moderate assist, patient does 50-74%  Assistive Device Front wheel walker  Distance Ambulated (ft) 3 ft  Range of Motion/Exercises Active;All extremities  Activity Response Tolerated well  Mobility Referral Yes  Mobility visit 1 Mobility  Mobility Specialist Start Time (ACUTE ONLY) 1355  Mobility Specialist Stop Time (ACUTE ONLY) 1413  Mobility Specialist Time Calculation (min) (ACUTE ONLY) 18 min   Pt received in chair, NT requesting assistance transferring to bed. Required ModA to stand and transfer with RW. Tolerated well, required verbal cues during transfer. NT and wife in room. All needs met.  Pedro Whiters Mobility Specialist Please contact via Special educational needs teacher or  Rehab office at [phone]

## 2023-12-18 NOTE — Plan of Care (Signed)
  Problem: Acute Rehab PT Goals(only PT should resolve) Goal: Pt Will Go Supine/Side To Sit Outcome: Progressing Flowsheets (Taken 12/18/2023 1204) Pt will go Supine/Side to Sit: with moderate assist Goal: Pt Will Transfer Bed To Chair/Chair To Bed Outcome: Progressing Flowsheets (Taken 12/18/2023 1204) Pt will Transfer Bed to Chair/Chair to Bed: with min assist Goal: Pt Will Ambulate Outcome: Progressing Flowsheets (Taken 12/18/2023 1204) Pt will Ambulate:  15 feet  with rolling walker  with minimal assist   Lacinda Fass, PT, DPT

## 2023-12-18 NOTE — Progress Notes (Signed)
 Hypoglycemic Event  CBG: 67  Treatment: 8 oz juice/soda  Symptoms: None  Follow-up CBG: Time:0405 CBG Result:71  Possible Reasons for Event: Inadequate meal intake  Comments/MD notified:CN notified, pt asymptomatic, will monitor closely     [name]

## 2023-12-19 DIAGNOSIS — I77810 Thoracic aortic ectasia: Secondary | ICD-10-CM

## 2023-12-19 DIAGNOSIS — I951 Orthostatic hypotension: Secondary | ICD-10-CM | POA: Diagnosis not present

## 2023-12-19 DIAGNOSIS — T796XXD Traumatic ischemia of muscle, subsequent encounter: Secondary | ICD-10-CM

## 2023-12-19 DIAGNOSIS — T796XXA Traumatic ischemia of muscle, initial encounter: Secondary | ICD-10-CM

## 2023-12-19 DIAGNOSIS — N179 Acute kidney failure, unspecified: Secondary | ICD-10-CM | POA: Diagnosis not present

## 2023-12-19 LAB — CBC
HCT: 39.9 % (ref 39.0–52.0)
Hemoglobin: 12.9 g/dL — ABNORMAL LOW (ref 13.0–17.0)
MCH: 31 pg (ref 26.0–34.0)
MCHC: 32.3 g/dL (ref 30.0–36.0)
MCV: 95.9 fL (ref 80.0–100.0)
Platelets: 149 K/uL — ABNORMAL LOW (ref 150–400)
RBC: 4.16 MIL/uL — ABNORMAL LOW (ref 4.22–5.81)
RDW: 13.1 % (ref 11.5–15.5)
WBC: 7.2 K/uL (ref 4.0–10.5)
nRBC: 0 % (ref 0.0–0.2)

## 2023-12-19 LAB — FOLATE: Folate: 7.8 ng/mL (ref >5.9)

## 2023-12-19 LAB — T4, FREE: Free T4: 1.34 ng/dL — ABNORMAL HIGH (ref 0.61–1.12)

## 2023-12-19 LAB — GLUCOSE, CAPILLARY
Glucose-Capillary: 140 mg/dL — ABNORMAL HIGH (ref 70–99)
Glucose-Capillary: 156 mg/dL — ABNORMAL HIGH (ref 70–99)
Glucose-Capillary: 189 mg/dL — ABNORMAL HIGH (ref 70–99)
Glucose-Capillary: 224 mg/dL — ABNORMAL HIGH (ref 70–99)
Glucose-Capillary: 152 mg/dL — ABNORMAL HIGH (ref 70–99)

## 2023-12-19 LAB — BASIC METABOLIC PANEL WITH GFR
Anion gap: 9 (ref 5–15)
BUN: 49 mg/dL — ABNORMAL HIGH (ref 8–23)
CO2: 24 mmol/L (ref 22–32)
Calcium: 8.2 mg/dL — ABNORMAL LOW (ref 8.9–10.3)
Chloride: 106 mmol/L (ref 98–111)
Creatinine, Ser: 1.22 mg/dL (ref 0.61–1.24)
GFR, Estimated: 59 mL/min — ABNORMAL LOW (ref >60)
Glucose, Bld: 157 mg/dL — ABNORMAL HIGH (ref 70–99)
Potassium: 3.6 mmol/L (ref 3.5–5.1)
Sodium: 139 mmol/L (ref 135–145)

## 2023-12-19 LAB — CK: Total CK: 751 U/L — ABNORMAL HIGH (ref 49–397)

## 2023-12-19 LAB — VITAMIN B12: Vitamin B-12: 329 pg/mL (ref 180–914)

## 2023-12-19 LAB — TSH: TSH: 0.581 u[IU]/mL (ref 0.350–4.500)

## 2023-12-19 LAB — MAGNESIUM: Magnesium: 1.9 mg/dL (ref 1.7–2.4)

## 2023-12-19 MED ORDER — LACTATED RINGERS IV BOLUS
1000.0000 mL | Freq: Once | INTRAVENOUS | Status: AC
  Administered 2023-12-19: 1000 mL via INTRAVENOUS

## 2023-12-19 MED ORDER — FOLIC ACID 1 MG PO TABS
1.0000 mg | ORAL_TABLET | Freq: Every day | ORAL | Status: DC
  Administered 2023-12-19 – 2023-12-20 (×2): 1 mg via ORAL
  Filled 2023-12-19 (×2): qty 1

## 2023-12-19 MED ORDER — LACTATED RINGERS IV SOLN: INTRAVENOUS | Status: AC

## 2023-12-19 MED ORDER — METOPROLOL TARTRATE 25 MG PO TABS
12.5000 mg | ORAL_TABLET | Freq: Two times a day (BID) | ORAL | Status: DC
  Administered 2023-12-19 – 2023-12-20 (×3): 12.5 mg via ORAL
  Filled 2023-12-19 (×3): qty 1

## 2023-12-19 MED ORDER — VITAMIN B-12 100 MCG PO TABS
500.0000 ug | ORAL_TABLET | Freq: Every day | ORAL | Status: DC
  Administered 2023-12-19 – 2023-12-20 (×2): 500 ug via ORAL
  Filled 2023-12-19 (×2): qty 5

## 2023-12-19 NOTE — Plan of Care (Signed)
  Problem: Acute Rehab OT Goals (only OT should resolve) Goal: Pt. Will Perform Grooming Flowsheets (Taken 12/19/2023 1600) Pt Will Perform Grooming:  with modified independence  sitting Goal: Pt. Will Perform Lower Body Bathing Flowsheets (Taken 12/19/2023 1600) Pt Will Perform Lower Body Bathing:  with set-up  sitting/lateral leans Goal: Pt. Will Perform Lower Body Dressing Flowsheets (Taken 12/19/2023 1600) Pt Will Perform Lower Body Dressing:  with supervision  sitting/lateral leans Goal: Pt. Will Perform Toileting-Clothing Manipulation Flowsheets (Taken 12/19/2023 1600) Pt Will Perform Toileting - Clothing Manipulation and hygiene:  with set-up  with supervision  sitting/lateral leans Goal: Pt/Caregiver Will Perform Home Exercise Program Flowsheets (Taken 12/19/2023 1600) Pt/caregiver will Perform Home Exercise Program:  Increased strength  Right Upper extremity  Independently  Elonda Giuliano OT, MOT

## 2023-12-19 NOTE — Care Management Important Message (Signed)
 Important Message  Patient Details  Name: [name] MRN: [id number] Date of Birth: [birth year]   Important Message Given:  N/A - LOS <3 / Initial given by admissions     Duwaine LITTIE Ada 12/19/2023, 8:49 AM

## 2023-12-19 NOTE — Plan of Care (Signed)

## 2023-12-19 NOTE — Plan of Care (Signed)
  Problem: Education: Goal: Knowledge of General Education information will improve Description: Including pain rating scale, medication(s)/side effects and non-pharmacologic comfort measures Outcome: Not Progressing   Problem: Health Behavior/Discharge Planning: Goal: Ability to manage health-related needs will improve Outcome: Not Progressing   Problem: Clinical Measurements: Goal: Ability to maintain clinical measurements within normal limits will improve Outcome: Progressing Goal: Will remain free from infection Outcome: Progressing Goal: Diagnostic test results will improve Outcome: Progressing Goal: Respiratory complications will improve Outcome: Progressing Goal: Cardiovascular complication will be avoided Outcome: Progressing   Problem: Activity: Goal: Risk for activity intolerance will decrease Outcome: Not Progressing   Problem: Nutrition: Goal: Adequate nutrition will be maintained Outcome: Progressing   Problem: Coping: Goal: Level of anxiety will decrease Outcome: Progressing   Problem: Elimination: Goal: Will not experience complications related to bowel motility Outcome: Progressing Goal: Will not experience complications related to urinary retention Outcome: Progressing   Problem: Pain Managment: Goal: General experience of comfort will improve and/or be controlled Outcome: Progressing   Problem: Safety: Goal: Ability to remain free from injury will improve Outcome: Progressing   Problem: Skin Integrity: Goal: Risk for impaired skin integrity will decrease Outcome: Progressing   Problem: Education: Goal: Ability to describe self-care measures that may prevent or decrease complications (Diabetes Survival Skills Education) will improve Outcome: Not Progressing   Problem: Coping: Goal: Ability to adjust to condition or change in health will improve Outcome: Progressing   Problem: Health Behavior/Discharge Planning: Goal: Ability to identify and  utilize available resources and services will improve Outcome: Progressing Goal: Ability to manage health-related needs will improve Outcome: Not Progressing   Problem: Metabolic: Goal: Ability to maintain appropriate glucose levels will improve Outcome: Progressing   Problem: Nutritional: Goal: Maintenance of adequate nutrition will improve Outcome: Progressing Goal: Progress toward achieving an optimal weight will improve Outcome: Progressing   Problem: Skin Integrity: Goal: Risk for impaired skin integrity will decrease Outcome: Progressing   Problem: Tissue Perfusion: Goal: Adequacy of tissue perfusion will improve Outcome: Progressing

## 2023-12-19 NOTE — Evaluation (Signed)
 Occupational Therapy Evaluation Patient Details Name: [name] MRN: [id number] DOB: [birth year] Today's Date: 12/19/2023   History of Present Illness   [name] is a 83 year old gentleman with atrial fibrillation on apixaban , history of CVA, type 2 diabetes mellitus, hypertension, GERD, hyperlipidemia, OSA on CPAP, who had recently been discharged from this hospital after being worked up for concern for TIA with aphasia that subsequently resolved.  He was discharged to a skilled nursing facility and after several days was discharged back to home.  He had only been home for couple of days.  Family said he had had issues with confusion and disorientation while he was at the nursing facility.  Unfortunately after being home he ended up falling off of his bed and complaining of mid to lower back pain but no loss of consciousness.  He was noted to be clinically dehydrated with dry mucous membranes and his lab work revealed an acute kidney injury with a creatinine of 2.81 where he had a creatinine of 1.00 on 11/30/2023.  He also arrived hypotensive with a blood pressure of 79/53 and MAP of 62.  He was hydrated with IV fluids via bolus in the ED with some improvement in blood pressures.  Admission was requested for further management. (per MD)     Clinical Impressions Pt agreeable to OT evaluation. No family present to discuss history. Pt not fully oriented but pleasant. Pt required mod to max A for bed mobility. Min to mod A for trasnfer to chair with RW. R UE weak, possibly a baseline issue. Pt unable to don his sock while in bed today. Overall pt is needing assist for ADL's and mobility. Pt left in the chair with call bell within reach and chair alarm set. Pt will benefit from continued OT in the hospital to increase strength, balance, and endurance for safe ADL's.        If plan is discharge home, recommend the following:   A lot of help with walking and/or transfers;A lot of help with  bathing/dressing/bathroom;Assistance with cooking/housework;Direct supervision/assist for medications management;Assist for transportation;Help with stairs or ramp for entrance     Functional Status Assessment   Patient has had a recent decline in their functional status and demonstrates the ability to make significant improvements in function in a reasonable and predictable amount of time.     Equipment Recommendations   None recommended by OT            Precautions/Restrictions   Precautions Precautions: Fall Recall of Precautions/Restrictions: Impaired Restrictions Weight Bearing Restrictions Per Provider Order: No     Mobility Bed Mobility Overal bed mobility: Needs Assistance Bed Mobility: Supine to Sit     Supine to sit: Mod assist, Max assist, HOB elevated     General bed mobility comments: Labored; assist to sit upright and move B LE to EOB.    Transfers Overall transfer level: Needs assistance Equipment used: Rolling walker (2 wheels) Transfers: Sit to/from Stand, Bed to chair/wheelchair/BSC Sit to Stand: Min assist     Step pivot transfers: Min assist, Mod assist     General transfer comment: EOB to chair; slow and labored; quick to sit rather than attempt to ambulate further.      Balance Overall balance assessment: Needs assistance Sitting-balance support: No upper extremity supported, Feet supported Sitting balance-Leahy Scale: Fair Sitting balance - Comments: seated at EOB   Standing balance support: Bilateral upper extremity supported, During functional activity, Reliant on assistive device for balance Standing  balance-Leahy Scale: Poor Standing balance comment: poor using RW                           ADL either performed or assessed with clinical judgement   ADL Overall ADL's : Needs assistance/impaired     Grooming: Sitting;Contact guard assist;Set up   Upper Body Bathing: Set up;Sitting;Contact guard assist   Lower  Body Bathing: Maximal assistance;Sitting/lateral leans;Moderate assistance   Upper Body Dressing : Set up;Contact guard assist;Sitting   Lower Body Dressing: Maximal assistance;Bed level Lower Body Dressing Details (indicate cue type and reason): Pt unable to don sock at bed level today. Toilet Transfer: Minimal assistance;Moderate assistance;Rolling walker (2 wheels);Stand-pivot Statistician Details (indicate cue type and reason): EOB to chair with RW Toileting- Clothing Manipulation and Hygiene: Moderate assistance;Sitting/lateral lean       Functional mobility during ADLs: Minimal assistance;Moderate assistance;Rolling walker (2 wheels)       Vision Baseline Vision/History: 1 Wears glasses Ability to See in Adequate Light: 2 Moderately impaired Patient Visual Report: No change from baseline Additional Comments: Not formally tested today. Pt reported no change in vision.     Perception Perception: Not tested       Praxis Praxis: Not tested       Pertinent Vitals/Pain Pain Assessment Pain Assessment: Faces Faces Pain Scale: Hurts little more Pain Location: B LE with movement Pain Descriptors / Indicators: Discomfort, Grimacing Pain Intervention(s): Monitored during session, Repositioned, Limited activity within patient's tolerance     Extremity/Trunk Assessment Upper Extremity Assessment Upper Extremity Assessment: RUE deficits/detail RUE Deficits / Details: 3+/5 shoulder flexion. Generally weak.   Lower Extremity Assessment Lower Extremity Assessment: Defer to PT evaluation   Cervical / Trunk Assessment Cervical / Trunk Assessment: Kyphotic   Communication Communication Communication: Impaired Factors Affecting Communication: Difficulty expressing self;Other (comment) (slow pace of speech)   Cognition Arousal: Alert Behavior During Therapy: WFL for tasks assessed/performed Cognition: No family/caregiver present to determine baseline, Cognition impaired    Orientation impairments: Time         OT - Cognition Comments: Oriented to place, name, and somewhat to situation.                 Following commands: Intact       Cueing  General Comments   Cueing Techniques: Verbal cues;Tactile cues;Gestural cues;Visual cues                 Home Living Family/patient expects to be discharged to:: Other (Comment) (Independent living facility.) Living Arrangements: Spouse/significant other Available Help at Discharge: Family;Available 24 hours/day;Other (Comment)                         Home Equipment: Rolling Walker (2 wheels);Cane - quad;BSC/3in1   Additional Comments: No family present to discuss history or DME.  Lives With: Spouse    Prior Functioning/Environment Prior Level of Function : Needs assist;History of Falls (last six months);Patient poor historian/Family not available       Physical Assist : ADLs (physical)   ADLs (physical): IADLs Mobility Comments: pt's wife notes that he ambulated with a RW for community mobility (per chart; wife not present today) ADLs Comments: reports independence with ADLs and staff at facility assist with  iADLs. (per chart; pt is a poor historian today.)    OT Problem List: Decreased strength;Decreased activity tolerance;Impaired balance (sitting and/or standing);Impaired vision/perception;Decreased cognition;Decreased coordination;Decreased safety awareness   OT Treatment/Interventions: Self-care/ADL  training;Therapeutic exercise;Therapeutic activities;Patient/family education;Cognitive remediation/compensation;Visual/perceptual remediation/compensation      OT Goals(Current goals can be found in the care plan section)   Acute Rehab OT Goals Patient Stated Goal: improve function OT Goal Formulation: With patient Time For Goal Achievement: 01/02/24 Potential to Achieve Goals: Good   OT Frequency:  Min 3X/week                                   End  of Session Equipment Utilized During Treatment: Rolling walker (2 wheels);Gait belt Nurse Communication: Other (comment) (notified the pt was in the chair)  Activity Tolerance: Patient tolerated treatment well Patient left: in chair;with call bell/phone within reach;with chair alarm set  OT Visit Diagnosis: Unsteadiness on feet (R26.81);Other abnormalities of gait and mobility (R26.89);Muscle weakness (generalized) (M62.81);Other symptoms and signs involving cognitive function                Time: 8550-8491 OT Time Calculation (min): 19 min Charges:  OT General Charges $OT Visit: 1 Visit OT Evaluation $OT Eval Low Complexity: 1 Low  Zafir Schauer OT, MOT  Jayson Person 12/19/2023, 3:56 PM

## 2023-12-19 NOTE — Progress Notes (Signed)
 PROGRESS NOTE  [name] FMW:[id number] DOB: 1941-02-04 DOA: 12/17/2023 PCP: Nichole Senior, MD  Brief History:  83 year old gentleman with atrial fibrillation on apixaban , history of CVA, type 2 diabetes mellitus, hypertension, GERD, hyperlipidemia, OSA on CPAP, who had recently been discharged from this hospital after being worked up for concern for TIA with aphasia that subsequently resolved.  He was discharged to a skilled nursing facility and after several days was discharged back to home.  He had only been home for couple of days.  Family said he had had issues with confusion and disorientation while he was at the nursing facility.  Unfortunately after being home he ended up falling off of his bed and complaining of mid to lower back pain but no loss of consciousness.  He was noted to be clinically dehydrated with dry mucous membranes and his lab work revealed an acute kidney injury with a creatinine of 2.81 where he had a creatinine of 1.00 on 11/30/2023.  He also arrived hypotensive with a blood pressure of 79/53 and MAP of 62.  He was hydrated with IV fluids via bolus in the ED with some improvement in blood pressures.  Admission was requested for further management.   Assessment/Plan: Orthostatic hypotension -- improving with IV fluid -- 9/17--repeat bolus - continue IVF  Rhabdomyolysis -CK 751 -continue IVF another 24 hours   AKI -- due to volume depletion - baseline creatinine 1.0-1.2 - presented with serum creatinine 2.81 -- check renal US --neg hydronephrosis -- continue IVF>>improving -- temporarily hold home ARB therapy and HCT   Persistent atrial fibrillation  -- continue apixaban  -- change coreg  to metoprolol  for HR control   Type 2 DM insulin  requiring -- 11/29/23 A1c is 7.6%  -- goal to keep CBG<180 and avoid hypoglycemia -- he is on glargine and linaglipitin at home -- novolog  sliding scale   Essential hypertension  -- reducing doses of home BP meds  temporarily due to hypotension - hold losartan /HCTZ   Hyperlipidemia -- hold statin temporarily due to elevated CK   History of CVA:  -Not on aspirin  due to Eliquis  use, continue simvastatin 80 mg nightly.    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 -outpt ortho referral           Family Communication:   daughter at bedside 9/18   Consultants:  none   Code Status:  FULL    DVT Prophylaxis:  apixaban      Procedures: As Listed in Progress Note Above   Antibiotics: None            Subjective: Patient denies fevers, chills, headache, chest pain, dyspnea, nausea, vomiting, diarrhea, abdominal pain, dysuria, hematuria, hematochezia, and melena.   Objective: Vitals:   12/17/23 2002 12/18/23 0500 12/18/23 1326 12/19/23 0350  BP: (!) 106/46 (!) 110/54 (!) 93/58 119/75  Pulse: 64 77 95 96  Resp: 16 17 19 20   Temp: 98.6 F (37 C) 98.2 F (36.8 C) 98.2 F (36.8 C) 97.6 F (36.4 C)  TempSrc: Axillary Oral  Oral  SpO2: 98% 98% 98% 97%  Weight: 81.6 kg     Height:        Intake/Output Summary (Last 24 hours) at 12/19/2023 1127 Last data filed at 12/19/2023 0905 Gross per 24 hour  Intake 1991.97 ml  Output 200  ml  Net 1791.97 ml   Weight change:  Exam:  General:  Pt is alert, follows commands appropriately, not in acute distress HEENT: No icterus, No thrush, No neck mass, [place]/AT Cardiovascular: RRR, S1/S2, no rubs, no gallops Respiratory: CTA bilaterally, no wheezing, no crackles, no rhonchi Abdomen: Soft/+BS, non tender, non distended, no guarding Extremities: No edema, No lymphangitis, No petechiae, No rashes, no synovitis   Data Reviewed: I have personally reviewed following labs and imaging studies Basic Metabolic Panel: Recent Labs  Lab 12/17/23 0655 12/18/23 0429  12/19/23 0426  NA 140 139 139  K 3.7 3.4* 3.6  CL 103 107 106  CO2 25 24 24   GLUCOSE 145* 78 157*  BUN 71* 60* 49*  CREATININE 2.81* 1.53* 1.22  CALCIUM  8.7* 8.0* 8.2*  MG  --  2.0 1.9   Liver Function Tests: Recent Labs  Lab 12/17/23 0655  AST 88*  ALT 37  ALKPHOS 43  BILITOT 2.0*  PROT 5.8*  ALBUMIN 3.3*   No results for input(s): LIPASE, AMYLASE in the last 168 hours. No results for input(s): AMMONIA in the last 168 hours. Coagulation Profile: No results for input(s): INR, PROTIME in the last 168 hours. CBC: Recent Labs  Lab 12/17/23 0655 12/18/23 0429 12/19/23 0426  WBC 8.1 6.8 7.2  NEUTROABS 5.8  --   --   HGB 13.7 12.6* 12.9*  HCT 41.0 37.9* 39.9  MCV 95.6 96.4 95.9  PLT 152 153 149*   Cardiac Enzymes: Recent Labs  Lab 12/19/23 0426  CKTOTAL 751*   BNP: Invalid input(s): POCBNP CBG: Recent Labs  Lab 12/18/23 1130 12/18/23 1615 12/18/23 2213 12/19/23 0347 12/19/23 0717  GLUCAP 172* 208* 143* 140* 156*   HbA1C: No results for input(s): HGBA1C in the last 72 hours. Urine analysis:    Component Value Date/Time   COLORURINE AMBER (A) 12/18/2023 1455   APPEARANCEUR HAZY (A) 12/18/2023 1455   LABSPEC 1.023 12/18/2023 1455   PHURINE 5.0 12/18/2023 1455   GLUCOSEU NEGATIVE 12/18/2023 1455   HGBUR MODERATE (A) 12/18/2023 1455   BILIRUBINUR NEGATIVE 12/18/2023 1455   KETONESUR NEGATIVE 12/18/2023 1455   PROTEINUR NEGATIVE 12/18/2023 1455   UROBILINOGEN 0.2 03/13/2014 2240   NITRITE NEGATIVE 12/18/2023 1455   LEUKOCYTESUR SMALL (A) 12/18/2023 1455   Sepsis Labs: @LABRCNTIP (procalcitonin:4,lacticidven:4) ) Recent Results (from the past 240 hours)  Culture, blood (routine x 2)     Status: None (Preliminary result)   Collection Time: 12/17/23  7:36 AM   Specimen: BLOOD  Result Value Ref Range Status   Specimen Description BLOOD BLOOD LEFT HAND  Final   Special Requests   Final    Blood Culture results may not be optimal due to  an inadequate volume of blood received in culture bottles BOTTLES DRAWN AEROBIC ONLY   Culture   Final    NO GROWTH 2 DAYS Performed at [facility], [address], [place], [place] [address]    Report Status PENDING  Incomplete  Culture, blood (routine x 2)     Status: None (Preliminary result)   Collection Time: 12/17/23  7:37 AM   Specimen: BLOOD  Result Value Ref Range Status   Specimen Description BLOOD BLOOD LEFT HAND  Final   Special Requests   Final    Blood Culture results may not be optimal due to an inadequate volume of blood received in culture bottles BOTTLES DRAWN AEROBIC ONLY   Culture   Final    NO GROWTH 2 DAYS Performed at [facility]  [facility], [address] [place], [place] [address]    Report Status PENDING  Incomplete     Scheduled Meds:  apixaban   2.5 mg Oral BID   atorvastatin   40 mg Oral QPM   escitalopram   10 mg Oral Daily   folic acid   1 mg Oral Daily   insulin  aspart  0-9 Units Subcutaneous TID WC   mirabegron  ER  50 mg Oral Daily   vitamin B-12  500 mcg Oral Daily   Continuous Infusions:  Procedures/Studies: DG Shoulder Right Result Date: 12/18/2023 EXAM: 1 VIEW XRAY OF THE RIGHT SHOULDER 12/18/2023 10:50:00 AM COMPARISON: None available. CLINICAL HISTORY: Right shoulder pain after fall yesterday FINDINGS: BONES AND JOINTS: Normal alignment of acromioclavicular joint with moderate degenerative changes. Osteophytes of the humeral head and glenoid. High riding humeral head with loss of acromiohumeral distance, likely representing underlying rotator cuff tear. SOFT TISSUES: Vascular calcifications present. IMPRESSION: 1. High riding humeral head with loss of acromiohumeral distance, likely representing underlying rotator cuff tear. 2. Moderate degenerative changes in the acromioclavicular and glenohumeral joints. Electronically signed by: Donnice Mania MD 12/18/2023 12:32 PM EDT RP Workstation: [id number]   US  RENAL Result Date: 12/17/2023 CLINICAL DATA:  Acute  kidney injury. EXAM: RENAL / URINARY TRACT ULTRASOUND COMPLETE COMPARISON:  None Available. FINDINGS: Right Kidney: Renal measurements: 11.8 cm x 6.4 cm x 5.5 cm = volume: 217.2 mL. Echogenicity within normal limits. No mass or hydronephrosis visualized. Left Kidney: Renal measurements: 12.4 cm x 6.5 cm x 4.7 cm = volume: 236.9 mL. Echogenicity within normal limits. 2.3 cm x 2.1 cm x 2.3 cm and 2.0 cm x 1.8 cm x 1.8 cm simple cysts are seen within the left kidney. No hydronephrosis is visualized. Bladder: Appears normal for degree of bladder distention. Other: The prostate gland measures 4.8 cm x 0.4 cm x 5.2 cm (volume 57.1 mL). IMPRESSION: 1. Simple left renal cysts. 2. Enlarged prostate gland. Correlation with PSA levels is recommended. Electronically Signed   By: Suzen Dials M.D.   On: 12/17/2023 15:06   CT CHEST ABDOMEN PELVIS WO CONTRAST Result Date: 12/17/2023 CLINICAL DATA:  Fall from bed.  Mid to lower back pain. EXAM: CT CHEST, ABDOMEN AND PELVIS WITHOUT CONTRAST TECHNIQUE: Multidetector CT imaging of the chest, abdomen and pelvis was performed following the standard protocol without IV contrast. RADIATION DOSE REDUCTION: This exam was performed according to the departmental dose-optimization program which includes automated exposure control, adjustment of the mA and/or kV according to patient size and/or use of iterative reconstruction technique. COMPARISON:  CT chest abdomen and pelvis dated 03/14/2014. FINDINGS: CT CHEST FINDINGS Cardiovascular: Cardiomegaly. No pleural effusion. Multivessel coronary artery calcifications. Aortic valve calcifications. Thoracic aorta is normal in caliber with atherosclerotic calcification. Mediastinum/Nodes: No enlarged mediastinal or axillary lymph nodes. Trachea and esophagus demonstrate no significant findings. Lungs/Pleura: Motion degradation slightly limits the evaluation. Dependent changes at the posterior lung bases. Regions of scarring/linear atelectasis  in the right upper and middle lobes and the bilateral lung bases. Mild bronchial wall thickening. No pleural effusion or pneumothorax. Musculoskeletal: No acute fracture. Remote healed fracture of the right posterior eighth rib. CT ABDOMEN PELVIS FINDINGS Hepatobiliary: No evidence of hepatic injury. No suspicious focal hepatic lesion identified within the limits of an unenhanced exam. Cholelithiasis. No gallbladder wall thickening or pericholecystic fluid. No biliary dilatation. Pancreas: Unremarkable. Spleen: Unremarkable. Adrenals/Urinary Tract: Adrenal glands are unremarkable. Left renal cyst, for which no follow-up imaging is recommended. No urolithiasis or hydronephrosis. Bladder is unremarkable. Stomach/Bowel: Stomach and  small bowel are grossly unremarkable. No obstruction or inflammatory changes. Moderate volume of stool in the rectum. Vascular/Lymphatic: Atherosclerotic calcification of the abdominal aorta and its major branches. Nonaneurysmal abdominal aorta. No enlarged abdominopelvic lymph nodes. Reproductive: Prostate is enlarged measuring up to 7 cm in diameter. Other: No abdominopelvic ascites.  No intraperitoneal free air. Musculoskeletal: No acute osseous abnormality. 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 Multilevel degenerative changes of the thoracolumbar spine. IMPRESSION: 1. No acute traumatic findings in the chest, abdomen, or pelvis. 2. Cholelithiasis. 3. Prostatomegaly. 4. Additional nonacute findings, as described above. 5.  Aortic Atherosclerosis (ICD10-I70.0). Electronically Signed   By: Harrietta Sherry M.D.   On: 12/17/2023 09:39   CT L-SPINE NO CHARGE Result Date: 12/17/2023 CLINICAL DATA:  Mid to low back pain after falling from bed. EXAM: CT Lumbar Spine without contrast TECHNIQUE: Technique: Multiplanar CT images of the lumbar spine were reconstructed from contemporary CT of the Abdomen and Pelvis. RADIATION DOSE REDUCTION: This exam  was performed according to the departmental dose-optimization program which includes automated exposure control, adjustment of the mA and/or kV according to patient size and/or use of iterative reconstruction technique. CONTRAST:  No additional COMPARISON:  CT of the chest, abdomen and pelvis 03/14/2014. FINDINGS: Technical note: Suboptimal image quality, in part secondary to motion artifact. Segmentation: There are 5 lumbar type vertebral bodies. Alignment: Chronic grade 1 anterolisthesis at L5-S1 secondary to chronic bilateral L5 pars defects. Mild convex left scoliosis and straightening. Vertebrae: No evidence of acute fracture or traumatic subluxation. There is multilevel spondylosis with partially bridging endplate osteophytes. Partially bridging osteophytes are present at both sacroiliac joints. Paraspinal and other soft tissues: No acute paraspinal findings are identified. There is diffuse aortic and branch vessel atherosclerosis. Intra-details are dictated separately. Disc levels: Multilevel spondylosis with endplate osteophytes and facet hypertrophy. There is mild spinal stenosis and mild asymmetric narrowing of the right lateral recess and right foramen at L3-4. No significant spinal stenosis or foraminal narrowing at L4-5. At L5-S1, there is moderate chronic foraminal narrowing bilaterally due to the L5 pars defects and associated anterolisthesis. IMPRESSION: 1. No evidence of acute lumbar spine fracture, traumatic subluxation or static signs of instability. 2. 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 3. 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 4. Mild spinal stenosis and asymmetric narrowing of the right lateral recess and right foramen at L3-4. 5.  Aortic Atherosclerosis (ICD10-I70.0). Electronically Signed   By: Elsie Perone M.D.   On: 12/17/2023 09:36   CT  Head Wo Contrast Result Date: 12/17/2023 CLINICAL DATA:  Patient fell out of bed.  Back pain EXAM: CT HEAD WITHOUT CONTRAST CT CERVICAL SPINE WITHOUT CONTRAST TECHNIQUE: Multidetector CT imaging of the head and cervical spine was performed following the standard protocol without intravenous contrast. Multiplanar CT image reconstructions of the cervical spine were also generated. RADIATION DOSE REDUCTION: This exam was performed according to the departmental dose-optimization program which includes automated exposure control, adjustment of the mA and/or kV according to patient size and/or use of iterative reconstruction technique. COMPARISON:  Head CT 11/29/2023.  Cervical spine CT 08/08/2016 FINDINGS: CT HEAD FINDINGS Brain: There is no evidence for acute hemorrhage, hydrocephalus, mass lesion, or abnormal extra-axial fluid collection. No definite CT evidence for acute infarction. Diffuse loss of parenchymal volume is consistent with atrophy. Patchy low attenuation in the deep hemispheric and periventricular white matter is  nonspecific, but likely reflects chronic microvascular ischemic demyelination. Old infarcts are seen in the inferior and posterior left frontal region and left occipital lobe chronic post ischemic encephalomalacia is again noted in the right basal ganglia. Vascular: No hyperdense vessel or unexpected calcification. Skull: No evidence for fracture. No worrisome lytic or sclerotic lesion. Sinuses/Orbits: Chronic mucosal disease noted in the maxillary sinuses. Visualized portions of the globes and intraorbital fat are unremarkable. Other: None. CT CERVICAL SPINE FINDINGS Alignment: Normal. Skull base and vertebrae: No acute fracture. No primary bone lesion or focal pathologic process. Soft tissues and spinal canal: No prevertebral fluid or swelling. No visible canal hematoma. Disc levels: Diffuse loss of intervertebral disc height noted in the cervical spine from C2-3 to C6-7 with associated  endplate spurring. Facets are well aligned bilaterally. Degenerative changes are noted at the C1-2 articulation. Upper chest: Unremarkable. Other: None. IMPRESSION: 1. No acute intracranial abnormality. 2. Atrophy with chronic small vessel ischemic disease and old infarcts as above. 3. Degenerative changes in the cervical spine without fracture. Electronically Signed   By: Camellia Candle M.D.   On: 12/17/2023 09:20   CT Cervical Spine Wo Contrast Result Date: 12/17/2023 CLINICAL DATA:  Patient fell out of bed.  Back pain EXAM: CT HEAD WITHOUT CONTRAST CT CERVICAL SPINE WITHOUT CONTRAST TECHNIQUE: Multidetector CT imaging of the head and cervical spine was performed following the standard protocol without intravenous contrast. Multiplanar CT image reconstructions of the cervical spine were also generated. RADIATION DOSE REDUCTION: This exam was performed according to the departmental dose-optimization program which includes automated exposure control, adjustment of the mA and/or kV according to patient size and/or use of iterative reconstruction technique. COMPARISON:  Head CT 11/29/2023.  Cervical spine CT 08/08/2016 FINDINGS: CT HEAD FINDINGS Brain: There is no evidence for acute hemorrhage, hydrocephalus, mass lesion, or abnormal extra-axial fluid collection. No definite CT evidence for acute infarction. Diffuse loss of parenchymal volume is consistent with atrophy. Patchy low attenuation in the deep hemispheric and periventricular white matter is nonspecific, but likely reflects chronic microvascular ischemic demyelination. Old infarcts are seen in the inferior and posterior left frontal region and left occipital lobe chronic post ischemic encephalomalacia is again noted in the right basal ganglia. Vascular: No hyperdense vessel or unexpected calcification. Skull: No evidence for fracture. No worrisome lytic or sclerotic lesion. Sinuses/Orbits: Chronic mucosal disease noted in the maxillary sinuses. Visualized  portions of the globes and intraorbital fat are unremarkable. Other: None. CT CERVICAL SPINE FINDINGS Alignment: Normal. Skull base and vertebrae: No acute fracture. No primary bone lesion or focal pathologic process. Soft tissues and spinal canal: No prevertebral fluid or swelling. No visible canal hematoma. Disc levels: Diffuse loss of intervertebral disc height noted in the cervical spine from C2-3 to C6-7 with associated endplate spurring. Facets are well aligned bilaterally. Degenerative changes are noted at the C1-2 articulation. Upper chest: Unremarkable. Other: None. IMPRESSION: 1. No acute intracranial abnormality. 2. Atrophy with chronic small vessel ischemic disease and old infarcts as above. 3. Degenerative changes in the cervical spine without fracture. Electronically Signed   By: Camellia Candle M.D.   On: 12/17/2023 09:20   DG Pelvis Portable Result Date: 12/17/2023 CLINICAL DATA:  Fall off bed. EXAM: PORTABLE PELVIS 1-2 VIEWS COMPARISON:  None Available. FINDINGS: There is no evidence of pelvic fracture or diastasis. No pelvic bone lesions are seen. IMPRESSION: No acute abnormality seen. Electronically Signed   By: Lynwood Landy Raddle M.D.   On: 12/17/2023 08:15   DG Chest  Portable 1 View Result Date: 12/17/2023 CLINICAL DATA:  Lower back pain after fall. EXAM: PORTABLE CHEST 1 VIEW COMPARISON:  Aug 17, 2017. FINDINGS: Stable cardiomediastinal silhouette. Left lung is clear. Stable probable right basilar scarring is noted. No definite acute pulmonary abnormality is noted. Bony thorax is unremarkable. IMPRESSION: No active disease. Electronically Signed   By: Lynwood Landy Raddle M.D.   On: 12/17/2023 08:14   ECHOCARDIOGRAM COMPLETE Result Date: 11/29/2023    ECHOCARDIOGRAM REPORT   Patient Name:   [name] Date of Exam: 11/29/2023 Medical Rec #:  [id number]    Height:       68.0 in Accession #:    [id number]   Weight:       181.9 lb Date of Birth:  10/25/1940    BSA:          1.963 m Patient Age:     83 years     BP:           142/66 mmHg Patient Gender: M            HR:           73 bpm. Exam Location:  [facility] Procedure: 2D Echo, Cardiac Doppler, Color Doppler and Saline Contrast Bubble            Study (Both Spectral and Color Flow Doppler were utilized during            procedure). Indications:    Stroke l63.9  History:        Patient has prior history of Echocardiogram examinations, most                 recent 03/14/2023. Arrythmias:Atrial Fibrillation; Risk                 Factors:Hypertension, Diabetes and Sleep Apnea.  Sonographer:    Aida Pizza RCS Referring Phys: [id number] TIMOTHY S OPYD IMPRESSIONS  1. Left ventricular ejection fraction, by estimation, is 60 to 65%. The left ventricle has normal function. The left ventricle has no regional wall motion abnormalities. There is mild left ventricular hypertrophy. Left ventricular diastolic function could not be evaluated.  2. Right ventricular systolic function is normal. The right ventricular size is normal. There is mildly elevated pulmonary artery systolic pressure. The estimated right ventricular systolic pressure is 43.5 mmHg.  3. Left atrial size was severely dilated.  4. Right atrial size was severely dilated.  5. The mitral valve is abnormal. Mild mitral valve regurgitation. No evidence of mitral stenosis. Moderate mitral annular calcification.  6. The tricuspid valve is abnormal. Tricuspid valve regurgitation is moderate to severe.  7. The aortic valve is calcified. There is severe calcifcation of the aortic valve. There is moderate thickening of the aortic valve. Aortic valve regurgitation is moderate. Mild to moderate aortic valve stenosis. Aortic regurgitation PHT measures 692 msec. Aortic valve area, by VTI measures 1.21 cm. Aortic valve mean gradient measures 10.5 mmHg. Aortic valve Vmax measures 2.33 m/s. DVI is 0.31.  8. The inferior vena cava is dilated in size with >50% respiratory variability, suggesting right atrial pressure of 8  mmHg.  9. Agitated saline contrast bubble study was negative, with no evidence of any interatrial shunt. FINDINGS  Left Ventricle: Left ventricular ejection fraction, by estimation, is 60 to 65%. The left ventricle has normal function. The left ventricle has no regional wall motion abnormalities. Strain was performed and the global longitudinal strain is indeterminate. The left ventricular internal cavity size was normal  in size. There is mild left ventricular hypertrophy. Left ventricular diastolic function could not be evaluated due to atrial fibrillation. Left ventricular diastolic function could not be evaluated. Right Ventricle: The right ventricular size is normal. No increase in right ventricular wall thickness. Right ventricular systolic function is normal. There is mildly elevated pulmonary artery systolic pressure. The tricuspid regurgitant velocity is 2.98  m/s, and with an assumed right atrial pressure of 8 mmHg, the estimated right ventricular systolic pressure is 43.5 mmHg. Left Atrium: Left atrial size was severely dilated. Right Atrium: Right atrial size was severely dilated. Pericardium: There is no evidence of pericardial effusion. Mitral Valve: The mitral valve is abnormal. Moderate mitral annular calcification. Mild mitral valve regurgitation. No evidence of mitral valve stenosis. Tricuspid Valve: The tricuspid valve is abnormal. Tricuspid valve regurgitation is moderate to severe. No evidence of tricuspid stenosis. Aortic Valve: The aortic valve is calcified. There is severe calcifcation of the aortic valve. There is moderate thickening of the aortic valve. Aortic valve regurgitation is moderate. Aortic regurgitation PHT measures 692 msec. Mild to moderate aortic stenosis is present. Aortic valve mean gradient measures 10.5 mmHg. Aortic valve peak gradient measures 21.7 mmHg. Aortic valve area, by VTI measures 1.21 cm. Pulmonic Valve: The pulmonic valve was thickened with good excursion.  Pulmonic valve regurgitation is trivial. No evidence of pulmonic stenosis. Aorta: The aortic root is normal in size and structure. Venous: The inferior vena cava is dilated in size with greater than 50% respiratory variability, suggesting right atrial pressure of 8 mmHg. IAS/Shunts: No atrial level shunt detected by color flow Doppler. Agitated saline contrast was given intravenously to evaluate for intracardiac shunting. Agitated saline contrast bubble study was negative, with no evidence of any interatrial shunt. Additional Comments: 3D was performed not requiring image post processing on an independent workstation and was indeterminate.  LEFT VENTRICLE PLAX 2D LVIDd:         4.40 cm LVIDs:         2.50 cm LV PW:         1.10 cm LV IVS:        1.20 cm LVOT diam:     2.20 cm LV SV:         55 LV SV Index:   28 LVOT Area:     3.80 cm  RIGHT VENTRICLE TAPSE (M-mode): 2.0 cm LEFT ATRIUM             Index        RIGHT ATRIUM           Index LA diam:        5.00 cm 2.55 cm/m   RA Area:     27.20 cm LA Vol (A2C):   85.3 ml 43.46 ml/m  RA Volume:   80.80 ml  41.16 ml/m LA Vol (A4C):   95.0 ml 48.40 ml/m LA Biplane Vol: 92.9 ml 47.33 ml/m  AORTIC VALVE AV Area (Vmax):    1.18 cm AV Area (Vmean):   1.24 cm AV Area (VTI):     1.21 cm AV Vmax:           233.00 cm/s AV Vmean:          141.500 cm/s AV VTI:            0.457 m AV Peak Grad:      21.7 mmHg AV Mean Grad:      10.5 mmHg LVOT Vmax:         72.40 cm/s  LVOT Vmean:        46.000 cm/s LVOT VTI:          0.146 m LVOT/AV VTI ratio: 0.32 AI PHT:            692 msec  AORTA Ao Root diam: 3.70 cm MITRAL VALVE               TRICUSPID VALVE MV Area (PHT): 3.12 cm    TR Peak grad:   35.5 mmHg MV Decel Time: 243 msec    TR Vmax:        298.00 cm/s MV E velocity: 73.30 cm/s                            SHUNTS                            Systemic VTI:  0.15 m                            Systemic Diam: 2.20 cm Vishnu Priya Mallipeddi Electronically signed by Diannah Late  Mallipeddi Signature Date/Time: 11/29/2023/3:55:07 PM    Final    MR BRAIN WO CONTRAST Result Date: 11/29/2023 EXAM: MR Brain without Intravenous Contrast. CLINICAL HISTORY: 83 year old male with type 2 diabetes, persistent atrial fibrillation, and hypertension, presenting with acute neuro deficit and suspected stroke. Last seen normal at 10 PM, found on the floor confused. Symptoms improving upon arrival. TECHNIQUE: Magnetic resonance images of the brain without intravenous contrast in multiple planes. CONTRAST: Without; COMPARISON: MRI brain 06/14/2022, CTA head and neck 11/29/2023 FINDINGS: BRAIN: No evidence of acute infarct within the limits of patient motion artifact. 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 Background of severe chronic small vessel disease. Tortuosity of the vertebral basilar system with diminished flow voids on T2-weighted imaging likely due to in-plane flow. No intracranial mass or hemorrhage. No midline shift or extra-axial fluid collection. No cerebellar tonsillar ectopia. VENTRICLES: No hydrocephalus. ORBITS: The orbits are normal. SINUSES AND MASTOIDS: The sinuses and mastoid air cells are clear. BONES: No acute fracture or focal osseous lesion. IMPRESSION: 1. No evidence of acute infarct within limits of motion artifact. 2. 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 3. Severe chronic small vessel disease. Electronically signed by: Ryan Chess MD 11/29/2023 11:28 AM EDT RP Workstation: [id number]   CT ANGIO HEAD NECK W WO CM W PERF (CODE STROKE) Result Date: 11/29/2023 CLINICAL DATA:  83 year old male neurologic deficit, found down fallen out of bed. Slurred speech and weakness. EXAM: CT ANGIOGRAPHY HEAD AND NECK CT PERFUSION BRAIN TECHNIQUE: Multidetector CT imaging of the head and neck was performed using the standard protocol during bolus administration  of intravenous contrast. Multiplanar CT image reconstructions and MIPs were obtained to evaluate the vascular anatomy. Carotid stenosis measurements (when applicable) are obtained utilizing NASCET criteria, using the distal internal carotid diameter as the denominator. Multiphase CT imaging of the brain was performed following IV bolus contrast injection. Subsequent parametric perfusion maps were calculated using RAPID software. RADIATION DOSE REDUCTION: This exam was performed according to the departmental dose-optimization program which includes automated exposure control, adjustment of the mA and/or kV according to patient size and/or use of iterative reconstruction technique. CONTRAST:  OMNIPAQUE  IOHEXOL  350 MG/ML SOLN COMPARISON:  Plain head CT 0318 hours today.  Brain MRI 06/14/2022. FINDINGS: CT Brain Perfusion Findings: ASPECTS: 10 CBF (<30%) Volume: 0mL. There is a small area of less stringent CBF, but not CBV, abnormality in the anterior left MCA territory cortex. Perfusion (Tmax>6.0s) volume: 82mL, but widespread in the bilateral cerebral white matter and likely artifactual. Questionable more pronounced oligemia anterior left MCA territory cortex. Mismatch Volume: Unclear due to suspected T-max artifact. Infarction Location:No infarct core detected. CTA NECK Skeleton: Pronounced Diffuse idiopathic skeletal hyperostosis (DISH). Associated widespread cervical and visible upper thoracic interbody ankylosis from bulky flowing endplate osteophytes. No acute osseous abnormality identified. Upper chest: Negative. Other neck: Nonvascular neck soft tissue spaces are within normal limits. Aortic arch: Calcified aortic atherosclerosis. Tortuous arch. Three vessel arch. Right carotid system: Brachiocephalic artery and right CCA origin plaque without stenosis. Tortuous right CCA with widespread but mild calcified plaque. No stenosis to the bifurcation. Moderate calcified plaque at the bifurcation. Tortuous right  ICA distal to the bulb. No stenosis to the skull base. Left carotid system: Similar tortuosity and widespread mild calcified plaque. Moderate calcified plaque at the bifurcation, left ICA origin and bulb. No stenosis results. Vertebral arteries: Proximal right subclavian artery atherosclerosis without significant stenosis. Heavily calcified right vertebral artery origin and proximal V1 segment with severe stenosis on series 7, image 179. The vessel remains patent. No additional plaque or stenosis to the skull base. Proximal left subclavian artery soft and calcified atherosclerosis without stenosis. Calcified plaque at the left vertebral artery origin resulting in moderate to severe stenosis series 7, image 177. Codominant left vertebral artery remains patent with mild additional V3 segment calcified plaque, no stenosis to the skull base. CTA HEAD Posterior circulation: Tortuous distal vertebral arteries and vertebrobasilar junction with abundant calcified plaque, especially in the left V4 segment. Mild-to-moderate left V4 stenosis. Patent PICA origins. No significant right V4 stenosis. Mild vertebrobasilar junction stenosis. Tortuous and heavily calcified basilar artery (series 6, image 124) with no significant basilar stenosis. Patent SCA and PCA origins with calcified plaque. No high-grade origin stenosis. Posterior communicating arteries are diminutive or absent. Tandem severe stenoses of the left PCA P1 and P2 segments (series 11, image 23). Similar severe stenosis at the right PCA P1/P2 junction. Anterior circulation: Both ICA siphons are patent and tortuous. Mild siphon plaque in the petrous segment. Widespread and moderate calcified plaque beginning in the cavernous segment. Moderate distal supraclinoid left ICA stenosis (series 8, image 112). Similar right siphon plaque and moderate distal right ICA supraclinoid stenosis (series 6, image 109). Patent carotid termini, MCA and ACA origins. No high-grade origin  stenosis. Diminutive or absent anterior communicating artery. Bilateral ACA branches are patent with mild irregularity. Left MCA M1 segment and bifurcation are patent. Right MCA mild proximal right M1 stenosis (series 2, image 20). Right M1 and right MCA bifurcation remain patent. Mild to moderate bilateral MCA M2 and distal branch irregularity. No complete branch occlusion is identified. Venous sinuses: Early contrast timing, not well evaluated. Anatomic variants: None. Review of the MIP images confirms the above findings IMPRESSION: 1. CTA is negative for large vessel occlusion, but Positive for Extensive Atherosclerosis throughout the head and neck. 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 - Moderate stenosis both distal supraclinoid ICAs. - Moderate to Severe stenosis bilateral PCA P1 and P2 segments. 2. CT Perfusion affected by artifact. Questionable oligemia in the anterior Left MCA division. 3.  Aortic Atherosclerosis (ICD10-I70.0). 4. 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 Electronically Signed   By: VEAR Hurst M.D.   On: 11/29/2023 04:11   CT HEAD CODE STROKE WO CONTRAST` Result Date: 11/29/2023 CLINICAL DATA:  Code stroke. Initial evaluation for acute neuro deficit, slurred speech, altered mental status. EXAM: CT HEAD WITHOUT CONTRAST TECHNIQUE: Contiguous axial images were obtained from the base of the skull through the vertex without intravenous contrast. RADIATION DOSE REDUCTION: This exam was performed according to the departmental dose-optimization program which includes automated exposure control, adjustment of the mA and/or kV according to patient size and/or use of iterative reconstruction technique. COMPARISON:  Prior study from 06/14/2022 FINDINGS: Brain: Generalized age-related cerebral atrophy. Patchy and confluent hypodensity involving the supratentorial cerebral white matter,  consistent with chronic small vessel ischemic disease, advanced in nature. Multiple scattered remote infarcts involving the left frontal lobe, left occipital lobe, and right basal ganglia/thalamus. No acute intracranial hemorrhage. No acute large vessel territory infarct. No mass lesion or midline shift. Mild ventricular prominence related global parenchymal volume loss of hydrocephalus. No convincing extra-axial fluid collection. Vascular: No abnormal hyperdense vessel. Extensive calcified atherosclerosis present at the skull base. Skull: Scalp soft tissues demonstrate no acute finding. Appear intact. Sinuses/Orbits: Globes orbital soft tissues within normal limits. Mild mucosal thickening present about the maxillary sinuses. Paranasal sinuses are otherwise clear. Trace right mastoid effusion noted, of doubtful significance. Other: None. ASPECTS [facility] Stroke Program Early CT Score) - Ganglionic level infarction (caudate, lentiform nuclei, internal capsule, insula, M1-M3 cortex): 7 - Supraganglionic infarction (M4-M6 cortex): 3 Total score (0-10 with 10 being normal): 10 IMPRESSION: 1. No acute intracranial abnormality. 2. Aspects is 10. 3. 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 Results were called by telephone at the time of interpretation on 11/29/2023 at 3:33 am to provider VICENTA ABLE , who verbally acknowledged these results. Electronically Signed   By: Morene Hoard M.D.   On: 11/29/2023 03:33    Alm Schneider, DO  [facility]  If 7PM-7AM, please contact night-coverage [email or web address] Password [email or web address] 12/19/2023, 11:27 AM   LOS: 2 days

## 2023-12-19 NOTE — TOC Progression Note (Signed)
 Transition of Care [facility]) - Progression Note    Patient Details  Name: [name] MRN: [id number] Date of Birth: 01-26-1941  Transition of [facility]) CM/SW Contact  Sharlyne Stabs, RN Phone Number: 12/19/2023, 11:11 AM  Clinical Narrative:   CM in the room with family, son, daughter and wife. Patient has 7 days of SNF left, [facility] has offered, they are agreeable when he is stable for discharge. Auth Pending.      Social Drivers of Health (SDOH) Interventions SDOH Screenings   Food Insecurity: No Food Insecurity (12/17/2023)  Housing: Low Risk  (12/17/2023)  Transportation Needs: No Transportation Needs (12/17/2023)  Utilities: Not At Risk (12/17/2023)  Social Connections: Moderately Isolated (12/17/2023)  Tobacco Use: Low Risk  (12/17/2023)   Readmission Risk Interventions     No data to display

## 2023-12-20 DIAGNOSIS — I4819 Other persistent atrial fibrillation: Secondary | ICD-10-CM | POA: Diagnosis not present

## 2023-12-20 DIAGNOSIS — I639 Cerebral infarction, unspecified: Secondary | ICD-10-CM | POA: Diagnosis present

## 2023-12-20 DIAGNOSIS — I77810 Thoracic aortic ectasia: Secondary | ICD-10-CM | POA: Diagnosis not present

## 2023-12-20 DIAGNOSIS — E1159 Type 2 diabetes mellitus with other circulatory complications: Secondary | ICD-10-CM | POA: Diagnosis not present

## 2023-12-20 DIAGNOSIS — T796XXS Traumatic ischemia of muscle, sequela: Secondary | ICD-10-CM | POA: Diagnosis not present

## 2023-12-20 DIAGNOSIS — N179 Acute kidney failure, unspecified: Secondary | ICD-10-CM | POA: Diagnosis not present

## 2023-12-20 DIAGNOSIS — I152 Hypertension secondary to endocrine disorders: Secondary | ICD-10-CM | POA: Diagnosis not present

## 2023-12-20 DIAGNOSIS — T796XXD Traumatic ischemia of muscle, subsequent encounter: Secondary | ICD-10-CM | POA: Diagnosis not present

## 2023-12-20 DIAGNOSIS — I69354 Hemiplegia and hemiparesis following cerebral infarction affecting left non-dominant side: Secondary | ICD-10-CM | POA: Diagnosis not present

## 2023-12-20 DIAGNOSIS — M6282 Rhabdomyolysis: Secondary | ICD-10-CM | POA: Diagnosis not present

## 2023-12-20 DIAGNOSIS — Z7901 Long term (current) use of anticoagulants: Secondary | ICD-10-CM | POA: Diagnosis not present

## 2023-12-20 DIAGNOSIS — E785 Hyperlipidemia, unspecified: Secondary | ICD-10-CM | POA: Diagnosis not present

## 2023-12-20 DIAGNOSIS — I480 Paroxysmal atrial fibrillation: Secondary | ICD-10-CM | POA: Diagnosis not present

## 2023-12-20 DIAGNOSIS — F0390 Unspecified dementia without behavioral disturbance: Secondary | ICD-10-CM | POA: Diagnosis not present

## 2023-12-20 DIAGNOSIS — G4730 Sleep apnea, unspecified: Secondary | ICD-10-CM | POA: Diagnosis not present

## 2023-12-20 DIAGNOSIS — N390 Urinary tract infection, site not specified: Secondary | ICD-10-CM | POA: Diagnosis not present

## 2023-12-20 DIAGNOSIS — I951 Orthostatic hypotension: Secondary | ICD-10-CM | POA: Diagnosis not present

## 2023-12-20 LAB — URINE CULTURE: Culture: >=100000 — AB

## 2023-12-20 LAB — GLUCOSE, CAPILLARY
Glucose-Capillary: 172 mg/dL — ABNORMAL HIGH (ref 70–99)
Glucose-Capillary: 171 mg/dL — ABNORMAL HIGH (ref 70–99)
Glucose-Capillary: 204 mg/dL — ABNORMAL HIGH (ref 70–99)

## 2023-12-20 LAB — BASIC METABOLIC PANEL WITH GFR
Anion gap: 6 (ref 5–15)
BUN: 39 mg/dL — ABNORMAL HIGH (ref 8–23)
CO2: 25 mmol/L (ref 22–32)
Calcium: 8.2 mg/dL — ABNORMAL LOW (ref 8.9–10.3)
Chloride: 107 mmol/L (ref 98–111)
Creatinine, Ser: 1.17 mg/dL (ref 0.61–1.24)
GFR, Estimated: >60 mL/min (ref >60)
Glucose, Bld: 177 mg/dL — ABNORMAL HIGH (ref 70–99)
Potassium: 4.1 mmol/L (ref 3.5–5.1)
Sodium: 138 mmol/L (ref 135–145)

## 2023-12-20 LAB — CBC
HCT: 38.2 % — ABNORMAL LOW (ref 39.0–52.0)
Hemoglobin: 12.5 g/dL — ABNORMAL LOW (ref 13.0–17.0)
MCH: 31.4 pg (ref 26.0–34.0)
MCHC: 32.7 g/dL (ref 30.0–36.0)
MCV: 96 fL (ref 80.0–100.0)
Platelets: 143 K/uL — ABNORMAL LOW (ref 150–400)
RBC: 3.98 MIL/uL — ABNORMAL LOW (ref 4.22–5.81)
RDW: 13.1 % (ref 11.5–15.5)
WBC: 9.6 K/uL (ref 4.0–10.5)
nRBC: 0 % (ref 0.0–0.2)

## 2023-12-20 LAB — CK: Total CK: 337 U/L (ref 49–397)

## 2023-12-20 LAB — MAGNESIUM: Magnesium: 1.6 mg/dL — ABNORMAL LOW (ref 1.7–2.4)

## 2023-12-20 MED ORDER — MAGNESIUM OXIDE -MG SUPPLEMENT 400 (240 MG) MG PO TABS: 400.0000 mg | ORAL_TABLET | Freq: Every day | ORAL | Status: DC

## 2023-12-20 MED ORDER — METOPROLOL TARTRATE 25 MG PO TABS: 12.5000 mg | ORAL_TABLET | Freq: Two times a day (BID) | ORAL | Status: DC

## 2023-12-20 MED ORDER — FOLIC ACID 1 MG PO TABS: 1.0000 mg | ORAL_TABLET | Freq: Every day | ORAL | Status: DC

## 2023-12-20 MED ORDER — CEPHALEXIN 500 MG PO CAPS
500.0000 mg | ORAL_CAPSULE | Freq: Three times a day (TID) | ORAL | Status: DC
  Administered 2023-12-20: 500 mg via ORAL
  Filled 2023-12-20: qty 1

## 2023-12-20 MED ORDER — CYANOCOBALAMIN 500 MCG PO TABS: 500.0000 ug | ORAL_TABLET | Freq: Every day | ORAL | Status: DC

## 2023-12-20 MED ORDER — CEPHALEXIN 500 MG PO CAPS: 500.0000 mg | ORAL_CAPSULE | Freq: Three times a day (TID) | ORAL | Status: DC

## 2023-12-20 MED ORDER — MAGNESIUM OXIDE -MG SUPPLEMENT 400 (240 MG) MG PO TABS
400.0000 mg | ORAL_TABLET | Freq: Two times a day (BID) | ORAL | Status: DC
  Administered 2023-12-20: 400 mg via ORAL
  Filled 2023-12-20: qty 1

## 2023-12-20 NOTE — Discharge Summary (Addendum)
 Physician Discharge Summary   Patient: [name] MRN: [id number] DOB: June 05, 1940  Admit date:     12/17/2023  Discharge date: 12/20/23  Discharge Physician: Alm Aivah Putman   PCP: Nichole Senior, MD   Recommendations at discharge:   Please follow up with primary care provider within 1-2 weeks  Please repeat BMP and CBC in one week     Hospital Course: 83 year old gentleman with atrial fibrillation on apixaban , history of CVA, type 2 diabetes mellitus, hypertension, GERD, hyperlipidemia, OSA on CPAP, who had recently been discharged from this hospital after being worked up for concern for TIA with aphasia that subsequently resolved.  He was discharged to a skilled nursing facility and after several days was discharged back to home.  He had only been home for couple of days.  Family said he had had issues with confusion and disorientation while he was at the nursing facility.  Unfortunately after being home he ended up falling off of his bed and complaining of mid to lower back pain but no loss of consciousness.  He was noted to be clinically dehydrated with dry mucous membranes and his lab work revealed an acute kidney injury with a creatinine of 2.81 where he had a creatinine of 1.00 on 11/30/2023.  He also arrived hypotensive with a blood pressure of 79/53 and MAP of 62.  He was hydrated with IV fluids via bolus in the ED with some improvement in blood pressures.  Admission was requested for further management. The patient was fluid resuscitated.  His BP improved.  His mental status also improved.  B12 and folic acid  were on the lower limit of normal.  Supplementation was started.  Renal ultrasound was negative for any hydronephrosis. Initial urinalysis showed some pyuria, 11-20 WBC.  Urine culture was not sent.  Repeat urine was collected and did grow Klebsiella.  He was started on cephalexin  after susceptibilities returned.  The patient's renal function gradually improved and returned to baseline.   CT of the chest abdomen and pelvis at the time of admission showed bibasilar atelectasis with bronchial wall thickening.  Otherwise there was no acute intra-abdominal or intrathoracic abnormalities.  X-ray of the pelvis was negative.  CT of the brain and CT of cervical spine were negative.  Assessment and Plan: Orthostatic hypotension -- improved -- 9/17--repeat bolused - continued IVF during hospitalization   Rhabdomyolysis -CK 751>>337 -continue IVF>>improved   AKI -- due to volume depletion - baseline creatinine 1.0-1.2 - presented with serum creatinine 2.81 -- check renal US --neg hydronephrosis -- continue IVF>>improved back to baseline -- temporarily hold home ARB therapy and HCT--will not restart as BP remains well controlled - serum creatinine 1.17 on day of dc  UTI -Initial UA 11-20 WBC - Repeat urine sample was sent and grew Klebsiella -The patient will be started on cephalexin  for 5 days based on the susceptibilities.   Persistent atrial fibrillation  -- continue apixaban  -- change coreg  to metoprolol  for HR control - rate controlled   Type 2 DM insulin  requiring -- 11/29/23 A1c is 7.6%  -- goal to keep CBG<180 and avoid hypoglycemia -- he is on glargine and linaglipitin at home -- novolog  sliding scale   Essential hypertension  -- reducing doses of home BP meds temporarily due to hypotension - hold losartan /hydrochlorothiazide --will not restart.  BP controlled   Hyperlipidemia -- hold statin temporarily due to elevated CK - restart statin after d/c   History of CVA:  -Not on aspirin  due to Eliquis  use, continue simvastatin  80 mg nightly.    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 -outpt ortho referral        Consultants: none Procedures performed: none  Disposition:  Skilled nursing facility Diet recommendation:  Regular diet DISCHARGE MEDICATION: Allergies as of 12/20/2023   No Known Allergies      Medication List     STOP taking these medications    carvedilol  25 MG tablet Commonly known as: COREG    losartan -hydrochlorothiazide  100-25 MG tablet Commonly known as: HYZAAR       TAKE these medications    apixaban  5 MG Tabs tablet Commonly known as: ELIQUIS  Take 1 tablet (5 mg total) by mouth 2 (two) times daily.   atorvastatin  40 MG tablet Commonly known as: LIPITOR Take 1 tablet (40 mg total) by mouth every evening.   cephALEXin  500 MG capsule Commonly known as: KEFLEX  Take 1 capsule (500 mg total) by mouth every 8 (eight) hours. X 5 days   cyanocobalamin  500 MCG tablet Commonly known as: VITAMIN B12 Take 1 tablet (500 mcg total) by mouth daily. Start taking on: December 21, 2023   escitalopram  10 MG tablet Commonly known as: LEXAPRO  Take 1 tablet (10 mg total) by mouth daily.   Fenofibric Acid  135 MG Cpdr Take 135 mg by mouth daily.   folic acid  1 MG tablet Commonly known as: FOLVITE  Take 1 tablet (1 mg total) by mouth daily. Start taking on: December 21, 2023   FreeStyle Libre 3 Plus Sensor Misc Change sensor every 15 days.   HumaLOG  Junior KwikPen 100 UNIT/ML KwikPen Junior Generic drug: Insulin  lispro Inject 10 Units into the skin 3 (three) times daily. With meals   hydrocortisone  2.5 % rectal cream Commonly known as: ANUSOL -HC Apply 1 Application topically 4 (four) times daily as needed for hemorrhoids or anal itching.   Insulin  Glargine Solostar 100 UNIT/ML Solostar Pen Commonly known as: LANTUS  Inject 12 Units into the skin 2 (two) times daily.   linagliptin  5 MG Tabs tablet Commonly known as: Tradjenta  Take 1 tablet (5 mg total) by mouth daily.   magnesium  oxide 400 (240 Mg) MG tablet Commonly known as: MAG-OX Take 1 tablet (400 mg total) by mouth daily.   metoprolol  tartrate 25 MG  tablet Commonly known as: LOPRESSOR  Take 0.5 tablets (12.5 mg total) by mouth 2 (two) times daily.   mirabegron  ER 50 MG Tb24 tablet Commonly known as: MYRBETRIQ  Take 1 tablet (50 mg total) by mouth daily.   OneTouch Verio test strip Generic drug: glucose blood SMARTSIG:1 Strip(s) Via Meter 5 Times Daily        Contact information for after-discharge care     Destination     [facility] .   Service: Skilled Nursing Contact information: [address] [place]  [address] [phone]                    Discharge Exam: Fredricka Weights   12/17/23 1953 12/17/23 2002  Weight: 81.6 kg 81.6 kg   HEENT:  [place]/AT, No thrush, no icterus CV:  RRR, no rub, no S3, no S4 Lung: Bibasilar rales.  No wheezing Abd:  soft/+BS, NT Ext:  No edema, no lymphangitis, no synovitis, no rash   Condition at discharge: stable  The results of significant diagnostics from this hospitalization (including imaging, microbiology, ancillary and laboratory) are  listed below for reference.   Imaging Studies: DG Shoulder Right Result Date: 12/18/2023 EXAM: 1 VIEW XRAY OF THE RIGHT SHOULDER 12/18/2023 10:50:00 AM COMPARISON: None available. CLINICAL HISTORY: Right shoulder pain after fall yesterday FINDINGS: BONES AND JOINTS: Normal alignment of acromioclavicular joint with moderate degenerative changes. Osteophytes of the humeral head and glenoid. High riding humeral head with loss of acromiohumeral distance, likely representing underlying rotator cuff tear. SOFT TISSUES: Vascular calcifications present. IMPRESSION: 1. High riding humeral head with loss of acromiohumeral distance, likely representing underlying rotator cuff tear. 2. Moderate degenerative changes in the acromioclavicular and glenohumeral joints. Electronically signed by: Donnice Mania MD 12/18/2023 12:32 PM EDT RP Workstation: [id number]   US  RENAL Result Date: 12/17/2023 CLINICAL DATA:  Acute kidney injury.  EXAM: RENAL / URINARY TRACT ULTRASOUND COMPLETE COMPARISON:  None Available. FINDINGS: Right Kidney: Renal measurements: 11.8 cm x 6.4 cm x 5.5 cm = volume: 217.2 mL. Echogenicity within normal limits. No mass or hydronephrosis visualized. Left Kidney: Renal measurements: 12.4 cm x 6.5 cm x 4.7 cm = volume: 236.9 mL. Echogenicity within normal limits. 2.3 cm x 2.1 cm x 2.3 cm and 2.0 cm x 1.8 cm x 1.8 cm simple cysts are seen within the left kidney. No hydronephrosis is visualized. Bladder: Appears normal for degree of bladder distention. Other: The prostate gland measures 4.8 cm x 0.4 cm x 5.2 cm (volume 57.1 mL). IMPRESSION: 1. Simple left renal cysts. 2. Enlarged prostate gland. Correlation with PSA levels is recommended. Electronically Signed   By: Suzen Dials M.D.   On: 12/17/2023 15:06   CT CHEST ABDOMEN PELVIS WO CONTRAST Result Date: 12/17/2023 CLINICAL DATA:  Fall from bed.  Mid to lower back pain. EXAM: CT CHEST, ABDOMEN AND PELVIS WITHOUT CONTRAST TECHNIQUE: Multidetector CT imaging of the chest, abdomen and pelvis was performed following the standard protocol without IV contrast. RADIATION DOSE REDUCTION: This exam was performed according to the departmental dose-optimization program which includes automated exposure control, adjustment of the mA and/or kV according to patient size and/or use of iterative reconstruction technique. COMPARISON:  CT chest abdomen and pelvis dated 03/14/2014. FINDINGS: CT CHEST FINDINGS Cardiovascular: Cardiomegaly. No pleural effusion. Multivessel coronary artery calcifications. Aortic valve calcifications. Thoracic aorta is normal in caliber with atherosclerotic calcification. Mediastinum/Nodes: No enlarged mediastinal or axillary lymph nodes. Trachea and esophagus demonstrate no significant findings. Lungs/Pleura: Motion degradation slightly limits the evaluation. Dependent changes at the posterior lung bases. Regions of scarring/linear atelectasis in the right  upper and middle lobes and the bilateral lung bases. Mild bronchial wall thickening. No pleural effusion or pneumothorax. Musculoskeletal: No acute fracture. Remote healed fracture of the right posterior eighth rib. CT ABDOMEN PELVIS FINDINGS Hepatobiliary: No evidence of hepatic injury. No suspicious focal hepatic lesion identified within the limits of an unenhanced exam. Cholelithiasis. No gallbladder wall thickening or pericholecystic fluid. No biliary dilatation. Pancreas: Unremarkable. Spleen: Unremarkable. Adrenals/Urinary Tract: Adrenal glands are unremarkable. Left renal cyst, for which no follow-up imaging is recommended. No urolithiasis or hydronephrosis. Bladder is unremarkable. Stomach/Bowel: Stomach and small bowel are grossly unremarkable. No obstruction or inflammatory changes. Moderate volume of stool in the rectum. Vascular/Lymphatic: Atherosclerotic calcification of the abdominal aorta and its major branches. Nonaneurysmal abdominal aorta. No enlarged abdominopelvic lymph nodes. Reproductive: Prostate is enlarged measuring up to 7 cm in diameter. Other: No abdominopelvic ascites.  No intraperitoneal free air. Musculoskeletal: No acute osseous abnormality. 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 Multilevel degenerative changes of the  thoracolumbar spine. IMPRESSION: 1. No acute traumatic findings in the chest, abdomen, or pelvis. 2. Cholelithiasis. 3. Prostatomegaly. 4. Additional nonacute findings, as described above. 5.  Aortic Atherosclerosis (ICD10-I70.0). Electronically Signed   By: Harrietta Sherry M.D.   On: 12/17/2023 09:39   CT L-SPINE NO CHARGE Result Date: 12/17/2023 CLINICAL DATA:  Mid to low back pain after falling from bed. EXAM: CT Lumbar Spine without contrast TECHNIQUE: Technique: Multiplanar CT images of the lumbar spine were reconstructed from contemporary CT of the Abdomen and Pelvis. RADIATION DOSE REDUCTION: This exam was performed  according to the departmental dose-optimization program which includes automated exposure control, adjustment of the mA and/or kV according to patient size and/or use of iterative reconstruction technique. CONTRAST:  No additional COMPARISON:  CT of the chest, abdomen and pelvis 03/14/2014. FINDINGS: Technical note: Suboptimal image quality, in part secondary to motion artifact. Segmentation: There are 5 lumbar type vertebral bodies. Alignment: Chronic grade 1 anterolisthesis at L5-S1 secondary to chronic bilateral L5 pars defects. Mild convex left scoliosis and straightening. Vertebrae: No evidence of acute fracture or traumatic subluxation. There is multilevel spondylosis with partially bridging endplate osteophytes. Partially bridging osteophytes are present at both sacroiliac joints. Paraspinal and other soft tissues: No acute paraspinal findings are identified. There is diffuse aortic and branch vessel atherosclerosis. Intra-details are dictated separately. Disc levels: Multilevel spondylosis with endplate osteophytes and facet hypertrophy. There is mild spinal stenosis and mild asymmetric narrowing of the right lateral recess and right foramen at L3-4. No significant spinal stenosis or foraminal narrowing at L4-5. At L5-S1, there is moderate chronic foraminal narrowing bilaterally due to the L5 pars defects and associated anterolisthesis. IMPRESSION: 1. No evidence of acute lumbar spine fracture, traumatic subluxation or static signs of instability. 2. 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 3. 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 4. Mild spinal stenosis and asymmetric narrowing of the right lateral recess and right foramen at L3-4. 5.  Aortic Atherosclerosis (ICD10-I70.0). Electronically Signed   By: Elsie Perone M.D.   On: 12/17/2023 09:36   CT Head Wo  Contrast Result Date: 12/17/2023 CLINICAL DATA:  Patient fell out of bed.  Back pain EXAM: CT HEAD WITHOUT CONTRAST CT CERVICAL SPINE WITHOUT CONTRAST TECHNIQUE: Multidetector CT imaging of the head and cervical spine was performed following the standard protocol without intravenous contrast. Multiplanar CT image reconstructions of the cervical spine were also generated. RADIATION DOSE REDUCTION: This exam was performed according to the departmental dose-optimization program which includes automated exposure control, adjustment of the mA and/or kV according to patient size and/or use of iterative reconstruction technique. COMPARISON:  Head CT 11/29/2023.  Cervical spine CT 08/08/2016 FINDINGS: CT HEAD FINDINGS Brain: There is no evidence for acute hemorrhage, hydrocephalus, mass lesion, or abnormal extra-axial fluid collection. No definite CT evidence for acute infarction. Diffuse loss of parenchymal volume is consistent with atrophy. Patchy low attenuation in the deep hemispheric and periventricular white matter is nonspecific, but likely reflects chronic microvascular ischemic demyelination. Old infarcts are seen in the inferior and posterior left frontal region and left occipital lobe chronic post ischemic encephalomalacia is again noted in the right basal ganglia. Vascular: No hyperdense vessel or unexpected calcification. Skull: No evidence for fracture. No worrisome lytic or sclerotic lesion. Sinuses/Orbits: Chronic mucosal disease noted in the maxillary sinuses. Visualized portions of the globes and intraorbital fat are unremarkable. Other: None. CT CERVICAL SPINE FINDINGS Alignment:  Normal. Skull base and vertebrae: No acute fracture. No primary bone lesion or focal pathologic process. Soft tissues and spinal canal: No prevertebral fluid or swelling. No visible canal hematoma. Disc levels: Diffuse loss of intervertebral disc height noted in the cervical spine from C2-3 to C6-7 with associated endplate  spurring. Facets are well aligned bilaterally. Degenerative changes are noted at the C1-2 articulation. Upper chest: Unremarkable. Other: None. IMPRESSION: 1. No acute intracranial abnormality. 2. Atrophy with chronic small vessel ischemic disease and old infarcts as above. 3. Degenerative changes in the cervical spine without fracture. Electronically Signed   By: Camellia Candle M.D.   On: 12/17/2023 09:20   CT Cervical Spine Wo Contrast Result Date: 12/17/2023 CLINICAL DATA:  Patient fell out of bed.  Back pain EXAM: CT HEAD WITHOUT CONTRAST CT CERVICAL SPINE WITHOUT CONTRAST TECHNIQUE: Multidetector CT imaging of the head and cervical spine was performed following the standard protocol without intravenous contrast. Multiplanar CT image reconstructions of the cervical spine were also generated. RADIATION DOSE REDUCTION: This exam was performed according to the departmental dose-optimization program which includes automated exposure control, adjustment of the mA and/or kV according to patient size and/or use of iterative reconstruction technique. COMPARISON:  Head CT 11/29/2023.  Cervical spine CT 08/08/2016 FINDINGS: CT HEAD FINDINGS Brain: There is no evidence for acute hemorrhage, hydrocephalus, mass lesion, or abnormal extra-axial fluid collection. No definite CT evidence for acute infarction. Diffuse loss of parenchymal volume is consistent with atrophy. Patchy low attenuation in the deep hemispheric and periventricular white matter is nonspecific, but likely reflects chronic microvascular ischemic demyelination. Old infarcts are seen in the inferior and posterior left frontal region and left occipital lobe chronic post ischemic encephalomalacia is again noted in the right basal ganglia. Vascular: No hyperdense vessel or unexpected calcification. Skull: No evidence for fracture. No worrisome lytic or sclerotic lesion. Sinuses/Orbits: Chronic mucosal disease noted in the maxillary sinuses. Visualized portions  of the globes and intraorbital fat are unremarkable. Other: None. CT CERVICAL SPINE FINDINGS Alignment: Normal. Skull base and vertebrae: No acute fracture. No primary bone lesion or focal pathologic process. Soft tissues and spinal canal: No prevertebral fluid or swelling. No visible canal hematoma. Disc levels: Diffuse loss of intervertebral disc height noted in the cervical spine from C2-3 to C6-7 with associated endplate spurring. Facets are well aligned bilaterally. Degenerative changes are noted at the C1-2 articulation. Upper chest: Unremarkable. Other: None. IMPRESSION: 1. No acute intracranial abnormality. 2. Atrophy with chronic small vessel ischemic disease and old infarcts as above. 3. Degenerative changes in the cervical spine without fracture. Electronically Signed   By: Camellia Candle M.D.   On: 12/17/2023 09:20   DG Pelvis Portable Result Date: 12/17/2023 CLINICAL DATA:  Fall off bed. EXAM: PORTABLE PELVIS 1-2 VIEWS COMPARISON:  None Available. FINDINGS: There is no evidence of pelvic fracture or diastasis. No pelvic bone lesions are seen. IMPRESSION: No acute abnormality seen. Electronically Signed   By: Lynwood Landy Raddle M.D.   On: 12/17/2023 08:15   DG Chest Portable 1 View Result Date: 12/17/2023 CLINICAL DATA:  Lower back pain after fall. EXAM: PORTABLE CHEST 1 VIEW COMPARISON:  Aug 17, 2017. FINDINGS: Stable cardiomediastinal silhouette. Left lung is clear. Stable probable right basilar scarring is noted. No definite acute pulmonary abnormality is noted. Bony thorax is unremarkable. IMPRESSION: No active disease. Electronically Signed   By: Lynwood Landy Raddle M.D.   On: 12/17/2023 08:14   ECHOCARDIOGRAM COMPLETE Result Date: 11/29/2023    ECHOCARDIOGRAM  REPORT   Patient Name:   JAVARIS WIGINGTON Date of Exam: 11/29/2023 Medical Rec #:  [id number]    Height:       68.0 in Accession #:    [id number]   Weight:       181.9 lb Date of Birth:  05-Aug-1940    BSA:          1.963 m Patient Age:    83 years      BP:           142/66 mmHg Patient Gender: M            HR:           73 bpm. Exam Location:  [facility] Procedure: 2D Echo, Cardiac Doppler, Color Doppler and Saline Contrast Bubble            Study (Both Spectral and Color Flow Doppler were utilized during            procedure). Indications:    Stroke l63.9  History:        Patient has prior history of Echocardiogram examinations, most                 recent 03/14/2023. Arrythmias:Atrial Fibrillation; Risk                 Factors:Hypertension, Diabetes and Sleep Apnea.  Sonographer:    Aida Pizza RCS Referring Phys: [id number] TIMOTHY S OPYD IMPRESSIONS  1. Left ventricular ejection fraction, by estimation, is 60 to 65%. The left ventricle has normal function. The left ventricle has no regional wall motion abnormalities. There is mild left ventricular hypertrophy. Left ventricular diastolic function could not be evaluated.  2. Right ventricular systolic function is normal. The right ventricular size is normal. There is mildly elevated pulmonary artery systolic pressure. The estimated right ventricular systolic pressure is 43.5 mmHg.  3. Left atrial size was severely dilated.  4. Right atrial size was severely dilated.  5. The mitral valve is abnormal. Mild mitral valve regurgitation. No evidence of mitral stenosis. Moderate mitral annular calcification.  6. The tricuspid valve is abnormal. Tricuspid valve regurgitation is moderate to severe.  7. The aortic valve is calcified. There is severe calcifcation of the aortic valve. There is moderate thickening of the aortic valve. Aortic valve regurgitation is moderate. Mild to moderate aortic valve stenosis. Aortic regurgitation PHT measures 692 msec. Aortic valve area, by VTI measures 1.21 cm. Aortic valve mean gradient measures 10.5 mmHg. Aortic valve Vmax measures 2.33 m/s. DVI is 0.31.  8. The inferior vena cava is dilated in size with >50% respiratory variability, suggesting right atrial pressure of 8 mmHg.  9.  Agitated saline contrast bubble study was negative, with no evidence of any interatrial shunt. FINDINGS  Left Ventricle: Left ventricular ejection fraction, by estimation, is 60 to 65%. The left ventricle has normal function. The left ventricle has no regional wall motion abnormalities. Strain was performed and the global longitudinal strain is indeterminate. The left ventricular internal cavity size was normal in size. There is mild left ventricular hypertrophy. Left ventricular diastolic function could not be evaluated due to atrial fibrillation. Left ventricular diastolic function could not be evaluated. Right Ventricle: The right ventricular size is normal. No increase in right ventricular wall thickness. Right ventricular systolic function is normal. There is mildly elevated pulmonary artery systolic pressure. The tricuspid regurgitant velocity is 2.98  m/s, and with an assumed right atrial pressure of 8 mmHg, the estimated right ventricular systolic  pressure is 43.5 mmHg. Left Atrium: Left atrial size was severely dilated. Right Atrium: Right atrial size was severely dilated. Pericardium: There is no evidence of pericardial effusion. Mitral Valve: The mitral valve is abnormal. Moderate mitral annular calcification. Mild mitral valve regurgitation. No evidence of mitral valve stenosis. Tricuspid Valve: The tricuspid valve is abnormal. Tricuspid valve regurgitation is moderate to severe. No evidence of tricuspid stenosis. Aortic Valve: The aortic valve is calcified. There is severe calcifcation of the aortic valve. There is moderate thickening of the aortic valve. Aortic valve regurgitation is moderate. Aortic regurgitation PHT measures 692 msec. Mild to moderate aortic stenosis is present. Aortic valve mean gradient measures 10.5 mmHg. Aortic valve peak gradient measures 21.7 mmHg. Aortic valve area, by VTI measures 1.21 cm. Pulmonic Valve: The pulmonic valve was thickened with good excursion. Pulmonic valve  regurgitation is trivial. No evidence of pulmonic stenosis. Aorta: The aortic root is normal in size and structure. Venous: The inferior vena cava is dilated in size with greater than 50% respiratory variability, suggesting right atrial pressure of 8 mmHg. IAS/Shunts: No atrial level shunt detected by color flow Doppler. Agitated saline contrast was given intravenously to evaluate for intracardiac shunting. Agitated saline contrast bubble study was negative, with no evidence of any interatrial shunt. Additional Comments: 3D was performed not requiring image post processing on an independent workstation and was indeterminate.  LEFT VENTRICLE PLAX 2D LVIDd:         4.40 cm LVIDs:         2.50 cm LV PW:         1.10 cm LV IVS:        1.20 cm LVOT diam:     2.20 cm LV SV:         55 LV SV Index:   28 LVOT Area:     3.80 cm  RIGHT VENTRICLE TAPSE (M-mode): 2.0 cm LEFT ATRIUM             Index        RIGHT ATRIUM           Index LA diam:        5.00 cm 2.55 cm/m   RA Area:     27.20 cm LA Vol (A2C):   85.3 ml 43.46 ml/m  RA Volume:   80.80 ml  41.16 ml/m LA Vol (A4C):   95.0 ml 48.40 ml/m LA Biplane Vol: 92.9 ml 47.33 ml/m  AORTIC VALVE AV Area (Vmax):    1.18 cm AV Area (Vmean):   1.24 cm AV Area (VTI):     1.21 cm AV Vmax:           233.00 cm/s AV Vmean:          141.500 cm/s AV VTI:            0.457 m AV Peak Grad:      21.7 mmHg AV Mean Grad:      10.5 mmHg LVOT Vmax:         72.40 cm/s LVOT Vmean:        46.000 cm/s LVOT VTI:          0.146 m LVOT/AV VTI ratio: 0.32 AI PHT:            692 msec  AORTA Ao Root diam: 3.70 cm MITRAL VALVE               TRICUSPID VALVE MV Area (PHT): 3.12 cm    TR Peak grad:  35.5 mmHg MV Decel Time: 243 msec    TR Vmax:        298.00 cm/s MV E velocity: 73.30 cm/s                            SHUNTS                            Systemic VTI:  0.15 m                            Systemic Diam: 2.20 cm Vishnu Priya Mallipeddi Electronically signed by Diannah Late Mallipeddi Signature  Date/Time: 11/29/2023/3:55:07 PM    Final    MR BRAIN WO CONTRAST Result Date: 11/29/2023 EXAM: MR Brain without Intravenous Contrast. CLINICAL HISTORY: 83 year old male with type 2 diabetes, persistent atrial fibrillation, and hypertension, presenting with acute neuro deficit and suspected stroke. Last seen normal at 10 PM, found on the floor confused. Symptoms improving upon arrival. TECHNIQUE: Magnetic resonance images of the brain without intravenous contrast in multiple planes. CONTRAST: Without; COMPARISON: MRI brain 06/14/2022, CTA head and neck 11/29/2023 FINDINGS: BRAIN: No evidence of acute infarct within the limits of patient motion artifact. 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 Background of severe chronic small vessel disease. Tortuosity of the vertebral basilar system with diminished flow voids on T2-weighted imaging likely due to in-plane flow. No intracranial mass or hemorrhage. No midline shift or extra-axial fluid collection. No cerebellar tonsillar ectopia. VENTRICLES: No hydrocephalus. ORBITS: The orbits are normal. SINUSES AND MASTOIDS: The sinuses and mastoid air cells are clear. BONES: No acute fracture or focal osseous lesion. IMPRESSION: 1. No evidence of acute infarct within limits of motion artifact. 2. 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 3. Severe chronic small vessel disease. Electronically signed by: Ryan Chess MD 11/29/2023 11:28 AM EDT RP Workstation: [id number]   CT ANGIO HEAD NECK W WO CM W PERF (CODE STROKE) Result Date: 11/29/2023 CLINICAL DATA:  83 year old male neurologic deficit, found down fallen out of bed. Slurred speech and weakness. EXAM: CT ANGIOGRAPHY HEAD AND NECK CT PERFUSION BRAIN TECHNIQUE: Multidetector CT imaging of the head and neck was performed using the standard protocol during bolus administration of intravenous  contrast. Multiplanar CT image reconstructions and MIPs were obtained to evaluate the vascular anatomy. Carotid stenosis measurements (when applicable) are obtained utilizing NASCET criteria, using the distal internal carotid diameter as the denominator. Multiphase CT imaging of the brain was performed following IV bolus contrast injection. Subsequent parametric perfusion maps were calculated using RAPID software. RADIATION DOSE REDUCTION: This exam was performed according to the departmental dose-optimization program which includes automated exposure control, adjustment of the mA and/or kV according to patient size and/or use of iterative reconstruction technique. CONTRAST:  OMNIPAQUE  IOHEXOL  350 MG/ML SOLN COMPARISON:  Plain head CT 0318 hours today.  Brain MRI 06/14/2022. FINDINGS: CT Brain Perfusion Findings: ASPECTS: 10 CBF (<30%) Volume: 0mL. There is a small area of less stringent CBF, but not CBV, abnormality in the anterior left MCA territory cortex. Perfusion (Tmax>6.0s) volume: 82mL, but widespread in the bilateral cerebral white matter and likely artifactual. Questionable more pronounced oligemia anterior left MCA territory cortex. Mismatch Volume: Unclear due to suspected T-max artifact. Infarction Location:No infarct core detected. CTA NECK Skeleton:  Pronounced Diffuse idiopathic skeletal hyperostosis (DISH). Associated widespread cervical and visible upper thoracic interbody ankylosis from bulky flowing endplate osteophytes. No acute osseous abnormality identified. Upper chest: Negative. Other neck: Nonvascular neck soft tissue spaces are within normal limits. Aortic arch: Calcified aortic atherosclerosis. Tortuous arch. Three vessel arch. Right carotid system: Brachiocephalic artery and right CCA origin plaque without stenosis. Tortuous right CCA with widespread but mild calcified plaque. No stenosis to the bifurcation. Moderate calcified plaque at the bifurcation. Tortuous right ICA distal to  the bulb. No stenosis to the skull base. Left carotid system: Similar tortuosity and widespread mild calcified plaque. Moderate calcified plaque at the bifurcation, left ICA origin and bulb. No stenosis results. Vertebral arteries: Proximal right subclavian artery atherosclerosis without significant stenosis. Heavily calcified right vertebral artery origin and proximal V1 segment with severe stenosis on series 7, image 179. The vessel remains patent. No additional plaque or stenosis to the skull base. Proximal left subclavian artery soft and calcified atherosclerosis without stenosis. Calcified plaque at the left vertebral artery origin resulting in moderate to severe stenosis series 7, image 177. Codominant left vertebral artery remains patent with mild additional V3 segment calcified plaque, no stenosis to the skull base. CTA HEAD Posterior circulation: Tortuous distal vertebral arteries and vertebrobasilar junction with abundant calcified plaque, especially in the left V4 segment. Mild-to-moderate left V4 stenosis. Patent PICA origins. No significant right V4 stenosis. Mild vertebrobasilar junction stenosis. Tortuous and heavily calcified basilar artery (series 6, image 124) with no significant basilar stenosis. Patent SCA and PCA origins with calcified plaque. No high-grade origin stenosis. Posterior communicating arteries are diminutive or absent. Tandem severe stenoses of the left PCA P1 and P2 segments (series 11, image 23). Similar severe stenosis at the right PCA P1/P2 junction. Anterior circulation: Both ICA siphons are patent and tortuous. Mild siphon plaque in the petrous segment. Widespread and moderate calcified plaque beginning in the cavernous segment. Moderate distal supraclinoid left ICA stenosis (series 8, image 112). Similar right siphon plaque and moderate distal right ICA supraclinoid stenosis (series 6, image 109). Patent carotid termini, MCA and ACA origins. No high-grade origin stenosis.  Diminutive or absent anterior communicating artery. Bilateral ACA branches are patent with mild irregularity. Left MCA M1 segment and bifurcation are patent. Right MCA mild proximal right M1 stenosis (series 2, image 20). Right M1 and right MCA bifurcation remain patent. Mild to moderate bilateral MCA M2 and distal branch irregularity. No complete branch occlusion is identified. Venous sinuses: Early contrast timing, not well evaluated. Anatomic variants: None. Review of the MIP images confirms the above findings IMPRESSION: 1. CTA is negative for large vessel occlusion, but Positive for Extensive Atherosclerosis throughout the head and neck. 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 - Moderate stenosis both distal supraclinoid ICAs. - Moderate to Severe stenosis bilateral PCA P1 and P2 segments. 2. CT Perfusion affected by artifact. Questionable oligemia in the anterior Left MCA division. 3.  Aortic Atherosclerosis (ICD10-I70.0). 4. 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 Electronically Signed   By: VEAR Hurst M.D.   On: 11/29/2023 04:11   CT HEAD CODE STROKE WO CONTRAST` Result Date: 11/29/2023 CLINICAL DATA:  Code stroke. Initial evaluation for acute neuro deficit, slurred speech, altered mental status. EXAM: CT HEAD WITHOUT CONTRAST TECHNIQUE: Contiguous axial images were obtained from the base of the skull through the vertex without intravenous contrast. RADIATION DOSE REDUCTION: This exam was performed according to the departmental dose-optimization  program which includes automated exposure control, adjustment of the mA and/or kV according to patient size and/or use of iterative reconstruction technique. COMPARISON:  Prior study from 06/14/2022 FINDINGS: Brain: Generalized age-related cerebral atrophy. Patchy and confluent hypodensity involving the supratentorial cerebral white matter, consistent  with chronic small vessel ischemic disease, advanced in nature. Multiple scattered remote infarcts involving the left frontal lobe, left occipital lobe, and right basal ganglia/thalamus. No acute intracranial hemorrhage. No acute large vessel territory infarct. No mass lesion or midline shift. Mild ventricular prominence related global parenchymal volume loss of hydrocephalus. No convincing extra-axial fluid collection. Vascular: No abnormal hyperdense vessel. Extensive calcified atherosclerosis present at the skull base. Skull: Scalp soft tissues demonstrate no acute finding. Appear intact. Sinuses/Orbits: Globes orbital soft tissues within normal limits. Mild mucosal thickening present about the maxillary sinuses. Paranasal sinuses are otherwise clear. Trace right mastoid effusion noted, of doubtful significance. Other: None. ASPECTS [facility] Stroke Program Early CT Score) - Ganglionic level infarction (caudate, lentiform nuclei, internal capsule, insula, M1-M3 cortex): 7 - Supraganglionic infarction (M4-M6 cortex): 3 Total score (0-10 with 10 being normal): 10 IMPRESSION: 1. No acute intracranial abnormality. 2. Aspects is 10. 3. 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 Results were called by telephone at the time of interpretation on 11/29/2023 at 3:33 am to provider VICENTA ABLE , who verbally acknowledged these results. Electronically Signed   By: Morene Hoard M.D.   On: 11/29/2023 03:33    Microbiology: Results for orders placed or performed during the hospital encounter of 12/17/23  Culture, blood (routine x 2)     Status: None (Preliminary result)   Collection Time: 12/17/23  7:36 AM   Specimen: BLOOD  Result Value Ref Range Status   Specimen Description BLOOD BLOOD LEFT HAND  Final   Special Requests   Final    Blood Culture results may not be optimal due to an inadequate volume of blood received in culture bottles BOTTLES  DRAWN AEROBIC ONLY   Culture   Final    NO GROWTH 3 DAYS Performed at [facility], [address], [place], [place] [address]    Report Status PENDING  Incomplete  Culture, blood (routine x 2)     Status: None (Preliminary result)   Collection Time: 12/17/23  7:37 AM   Specimen: BLOOD  Result Value Ref Range Status   Specimen Description BLOOD BLOOD LEFT HAND  Final   Special Requests   Final    Blood Culture results may not be optimal due to an inadequate volume of blood received in culture bottles BOTTLES DRAWN AEROBIC ONLY   Culture   Final    NO GROWTH 3 DAYS Performed at [facility], [address] [place], [place] [address]    Report Status PENDING  Incomplete  Urine Culture (for pregnant, neutropenic or urologic patients or patients with an indwelling urinary catheter)     Status: Abnormal   Collection Time: 12/18/23  2:55 PM   Specimen: Urine, Clean Catch  Result Value Ref Range Status   Specimen Description   Final    URINE, CLEAN CATCH Performed at [facility], [address], [place], [place] [address]    Special Requests   Final    NONE Performed at [facility], [address] [place], [place] [address]    Culture >=100,000 COLONIES/mL KLEBSIELLA PNEUMONIAE (A)  Final   Report Status 12/20/2023 FINAL  Final   Organism ID, Bacteria KLEBSIELLA PNEUMONIAE (A)  Final      Susceptibility   Klebsiella pneumoniae - MIC*    AMPICILLIN >=32 RESISTANT Resistant     CEFAZOLIN (URINE) Value in next row Sensitive      <=1 SENSITIVEThis is a modified FDA-approved test that has been validated and its performance characteristics determined by the reporting laboratory.  This laboratory is certified under the Clinical Laboratory Improvement Amendments CLIA as qualified to perform high complexity clinical laboratory testing.    CEFEPIME  Value in next row Sensitive      <=1 SENSITIVEThis is a modified FDA-approved test that has been validated and its performance characteristics  determined by the reporting laboratory.  This laboratory is certified under the Clinical Laboratory Improvement Amendments CLIA as qualified to perform high complexity clinical laboratory testing.    ERTAPENEM Value in next row Sensitive      <=1 SENSITIVEThis is a modified FDA-approved test that has been validated and its performance characteristics determined by the reporting laboratory.  This laboratory is certified under the Clinical Laboratory Improvement Amendments CLIA as qualified to perform high complexity clinical laboratory testing.    CEFTRIAXONE Value in next row Sensitive      <=1 SENSITIVEThis is a modified FDA-approved test that has been validated and its performance characteristics determined by the reporting laboratory.  This laboratory is certified under the Clinical Laboratory Improvement Amendments CLIA as qualified to perform high complexity clinical laboratory testing.    CIPROFLOXACIN Value in next row Sensitive      <=1 SENSITIVEThis is a modified FDA-approved test that has been validated and its performance characteristics determined by the reporting laboratory.  This laboratory is certified under the Clinical Laboratory Improvement Amendments CLIA as qualified to perform high complexity clinical laboratory testing.    GENTAMICIN Value in next row Sensitive      <=1 SENSITIVEThis is a modified FDA-approved test that has been validated and its performance characteristics determined by the reporting laboratory.  This laboratory is certified under the Clinical Laboratory Improvement Amendments CLIA as qualified to perform high complexity clinical laboratory testing.    NITROFURANTOIN Value in next row Intermediate      <=1 SENSITIVEThis is a modified FDA-approved test that has been validated and its performance characteristics determined by the reporting laboratory.  This laboratory is certified under the Clinical Laboratory Improvement Amendments CLIA as qualified to perform high  complexity clinical laboratory testing.    TRIMETH/SULFA Value in next row Sensitive      <=1 SENSITIVEThis is a modified FDA-approved test that has been validated and its performance characteristics determined by the reporting laboratory.  This laboratory is certified under the Clinical Laboratory Improvement Amendments CLIA as qualified to perform high complexity clinical laboratory testing.    AMPICILLIN/SULBACTAM Value in next row Sensitive      <=1 SENSITIVEThis is a modified FDA-approved test that has been validated and its performance characteristics determined by the reporting laboratory.  This laboratory is certified under the Clinical Laboratory Improvement Amendments CLIA as qualified to perform high complexity clinical laboratory testing.    PIP/TAZO Value in next row Sensitive      <=4 SENSITIVEThis is a modified FDA-approved test that has been validated and its performance characteristics determined by the reporting laboratory.  This laboratory is certified under the Clinical Laboratory Improvement Amendments CLIA as qualified to perform high complexity clinical laboratory testing.    MEROPENEM Value in next row Sensitive      <=4 SENSITIVEThis is a modified FDA-approved test that has been validated and  its performance characteristics determined by the reporting laboratory.  This laboratory is certified under the Clinical Laboratory Improvement Amendments CLIA as qualified to perform high complexity clinical laboratory testing.    * >=100,000 COLONIES/mL KLEBSIELLA PNEUMONIAE    Labs: CBC: Recent Labs  Lab 12/17/23 0655 12/18/23 0429 12/19/23 0426 12/20/23 0328  WBC 8.1 6.8 7.2 9.6  NEUTROABS 5.8  --   --   --   HGB 13.7 12.6* 12.9* 12.5*  HCT 41.0 37.9* 39.9 38.2*  MCV 95.6 96.4 95.9 96.0  PLT 152 153 149* 143*   Basic Metabolic Panel: Recent Labs  Lab 12/17/23 0655 12/18/23 0429 12/19/23 0426 12/20/23 0328  NA 140 139 139 138  K 3.7 3.4* 3.6 4.1  CL 103 107 106 107   CO2 25 24 24 25   GLUCOSE 145* 78 157* 177*  BUN 71* 60* 49* 39*  CREATININE 2.81* 1.53* 1.22 1.17  CALCIUM  8.7* 8.0* 8.2* 8.2*  MG  --  2.0 1.9 1.6*   Liver Function Tests: Recent Labs  Lab 12/17/23 0655  AST 88*  ALT 37  ALKPHOS 43  BILITOT 2.0*  PROT 5.8*  ALBUMIN 3.3*   CBG: Recent Labs  Lab 12/19/23 1612 12/19/23 2041 12/20/23 0243 12/20/23 0720 12/20/23 1127  GLUCAP 224* 152* 172* 171* 204*    Discharge time spent: greater than 30 minutes.  Signed: Alm Schneider, MD [facility] 12/20/2023

## 2023-12-20 NOTE — Progress Notes (Signed)
 Report called to [facility] LPN at [facility]. Patient going to room 128.

## 2023-12-20 NOTE — TOC Transition Note (Signed)
 Transition of Care [facility]) - Discharge Note   Patient Details  Name: [name] MRN: [id number] Date of Birth: Nov 01, 1940  [facility] CM/SW Contact:  Lucie Lunger, LCSWA Phone Number: 12/20/2023, 2:41 PM  Clinical Narrative:    CSW updated that insurance shara has been approved for SNF at [facility]. CSW spoke to [place] with [facility] who states they are ready for pt to arrive today. CSW updated pts daughter on plan for D/C. D/C clinicals sent to facility via HUB. RN provided with room and report. TOC signing off.   Final next level of care: Skilled Nursing Facility Barriers to Discharge: Barriers Resolved   Patient Goals and CMS Choice Patient states their goals for this hospitalization and ongoing recovery are:: go to SNF CMS Medicare.gov Compare Post Acute Care list provided to:: Patient Choice offered to / list presented to : Patient, Adult Children [place] ownership interest in [facility].provided to:: Patient    Discharge Placement              Patient chooses bed at: [facility] Patient to be transferred to facility by: facility Name of family member notified: daughter Patient and family notified of of transfer: 12/20/23  Discharge Plan and Services Additional resources added to the After Visit Summary for                                       Social Drivers of Health (SDOH) Interventions SDOH Screenings   Food Insecurity: No Food Insecurity (12/17/2023)  Housing: Low Risk  (12/17/2023)  Transportation Needs: No Transportation Needs (12/17/2023)  Utilities: Not At Risk (12/17/2023)  Social Connections: Moderately Isolated (12/17/2023)  Tobacco Use: Low Risk  (12/17/2023)     Readmission Risk Interventions     No data to display

## 2023-12-20 NOTE — Plan of Care (Signed)

## 2023-12-22 ENCOUNTER — Non-Acute Institutional Stay (SKILLED_NURSING_FACILITY): Payer: Self-pay | Admitting: Internal Medicine

## 2023-12-22 ENCOUNTER — Encounter: Payer: Self-pay | Admitting: Internal Medicine

## 2023-12-22 DIAGNOSIS — T796XXD Traumatic ischemia of muscle, subsequent encounter: Secondary | ICD-10-CM | POA: Diagnosis not present

## 2023-12-22 DIAGNOSIS — I4819 Other persistent atrial fibrillation: Secondary | ICD-10-CM

## 2023-12-22 DIAGNOSIS — N179 Acute kidney failure, unspecified: Secondary | ICD-10-CM

## 2023-12-22 DIAGNOSIS — I951 Orthostatic hypotension: Secondary | ICD-10-CM

## 2023-12-22 DIAGNOSIS — F0390 Unspecified dementia without behavioral disturbance: Secondary | ICD-10-CM

## 2023-12-22 LAB — CULTURE, BLOOD (ROUTINE X 2)
Culture: NO GROWTH
Culture: NO GROWTH

## 2023-12-22 NOTE — Assessment & Plan Note (Signed)
 12/22/2023 he has no comprehension of why he was hospitalized or what diagnoses were made.  As noted, MMSE score was 3 out of 30 indicating severe dementia.  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

## 2023-12-22 NOTE — Assessment & Plan Note (Signed)
 Follow-up CK was 337.  There is a discrepancy in the discharge summary as to whether he was to be on atorvastatin  40 or 80 mg.  Because of the rhabdo; the lipophilic statin will be changed to the hydrophilic statin, rosuvastatin  20 mg daily with subsequent follow-up CK and lipids.

## 2023-12-22 NOTE — Progress Notes (Unsigned)
 NURSING HOME LOCATION:  [facility] ROOM NUMBER: [id number]  CODE STATUS: Full code  PCP: Garnette Ore MD  This is a readmission note to this SNFperformed on this date less than 30 days from date of readmission. Included are preadmission medical/surgical history; reconciled medication list; family history; social history and comprehensive review of systems.  Corrections and additions to the records were documented. Comprehensive physical exam was also performed. Additionally a clinical summary was entered for each active diagnosis pertinent to this admission in the Problem List to enhance continuity of care.  HPI: He was hospitalized 9/16 - 12/20/2023 after mechanical fall from his bed with associated complaints of mid-lower back pain. This was in the context of having been in this facility from 8/30 - 9/12 for rehab following hospitalization 8/29 - 8/30 also related to a fall from bed.  There were no acute changes on CTA or MRI; but CTA was positive for extensive atherosclerosis of the head and neck with notable stenoses.  There was a severe right, moderate to severe left vertebral artery origin stenosis; moderate stenosis of both distal supraclinoid ICA; and moderate-severe stenosis bilateral PCA P1 and P2 segments.  MRI revealed unchanged old infarcts in the left frontal lobe, left superior occipital lobe and right corona radiata.  This was on a background of severe chronic small vessel disease. At the time of discharge from this facility 9/12, he was ambulating 150 feet with a rolling walker with supervision, upper body minimal assist, lower body moderate assist, and transfers with supervision, PRP-T contact-guard, & steps with minimal assistance.  His SLUMS MMSE revealed a score of 3 out of 30 indicating severe dementia.  Based on the imaging results he has severe vascular dementia as the etiology. In the ED 9/16 exam suggested clinical dehydration.  Labs demonstrated AKI with a  creatinine of 2.81 and a GFR of 22 indicating stage IV renal function.SABRA  His creatinine had been 1.00 on 8/30.  Hypotension was present with a blood pressure of 79/53 with a MAP of 62.  He was rehydrated with IV fluid bolus in the ED with some improvement in blood pressures.  Losartan /HCTZ was stopped as well as carvedilol  at discharge. CK peaked at 751 suggesting rhabdomyolysis.  Subsequent CK was 337.  Statin was temporally held due to the elevated CK with plans to restart after discharge. Normochromic, normocytic anemia was relatively stable with final H/H of 12.5/38.2; B12 and folic acid  levels were low normal.  Hypomagnesemia was documented with a value 1.6.  Supplementation was initiated. Initial urinalysis revealed pyuria with 11-20 white cells.  Subsequent culture and sensitivity did grow Klebsiella for which he received cephalexin .   CT of the chest/abdomen/pelvis at the time of admission revealed bibasilar atelectasis with bronchial wall thickening.  There was some discrepancy in the discharge orders as to whether he was on atorvastatin  40 or atorvastatin  80.  In the context of the rhabdomyolysis, rather than employing extremely high doses of the lipophilic statin; Lipitor was changed to rosuvastatin  20 mg daily @ the SNF. A1c of 7.6% on 8/29 indicated adequate control in context of his advanced comorbidities & age. With rehydration final creatinine was 1.17 with a GFR greater than 60. While hospitalized he complained of pain in the right shoulder.  Imaging revealed a high riding humeral head with loss of the acromiohumeral distance, likely representing underlying rotator cuff tear.  Outpatient follow-up was recommended. PT/OT consulted and recommended SNF for rehab.  Past medical and surgical  history: Persistent atrial fibrillation; diabetes with peripheral vascular complications; essential hypertension; dyslipidemia; history of cholelithiasis; history of CVA; GERD; and sleep apnea. Surgeries  and procedures include cardioversion; colonoscopy; and knee surgery.  Family history: reviewed, non contributory due to advanced age.  Social history: History of social alcohol  intake; non-smoker.   Review of systems: Clinical neurocognitive deficits made validity of responses questionable , compromising ROS completion.  He could not provide any diagnoses or the reason for hospitalization.  He stated it was because I was too weak to carry on conversation.  The answers essentially were monosyllable no even to the question of about having any shoulder pain.  Constitutional: No fever, significant weight change  Eyes: No redness, discharge, pain, vision change ENT/mouth: No nasal congestion, purulent discharge, earache, change in hearing, sore throat  Cardiovascular: No chest pain, palpitations, paroxysmal nocturnal dyspnea, claudication, edema  Respiratory: No cough, sputum production, hemoptysis, DOE, significant snoring, apnea Gastrointestinal: No heartburn, dysphagia, abdominal pain, nausea /vomiting, rectal bleeding, melena, change in bowels Genitourinary: No dysuria, hematuria, pyuria, incontinence, nocturia Musculoskeletal: No joint stiffness, joint swelling,  pain Dermatologic: No rash, pruritus, change in appearance of skin Neurologic: No dizziness, headache, syncope, seizures, numbness, tingling Psychiatric: No significant anxiety, depression, insomnia, anorexia Endocrine: No change in hair/skin/nails, excessive thirst, excessive hunger, excessive urination  Hematologic/lymphatic: No significant bruising, lymphadenopathy, abnormal bleeding Allergy/immunology: No itchy/watery eyes, significant sneezing, urticaria, angioedema  Physical exam:  Pertinent or positive findings: Speech is slow and dysarthric.  He has alopecia in a pattern distribution, especially over the crown.  Hair is disheveled.  Facies tend to be blank.  He has a mustache.  Misalignment of the mandibular teeth is  suggested. Respirations tend to be shallow without respiratory compromise.  Heart rhythm is irregular and heard best in the epigastrium.  Abdomen is protuberant.  Pedal pulses are not palpable.  He has trace-1/2+ edema at the sock line.  There is irregular faint scarring of the left lateral calf and a bandage of the left superior lateral calf.  There is slight ecchymosis of the dorsum of the right hand.  General appearance: no acute distress, increased work of breathing is present.   Lymphatic: No lymphadenopathy about the head, neck, axilla. Eyes: No conjunctival inflammation or lid edema is present. There is no scleral icterus. Ears:  External ear exam shows no significant lesions or deformities.   Nose:  External nasal examination shows no deformity or inflammation. Nasal mucosa are pink and moist without lesions, exudates Neck:  No thyromegaly, masses, tenderness noted.    Heart:  No gallop, murmur, click, rub.  Lungs:  without wheezes, rhonchi, rales, rubs. Abdomen: Bowel sounds are normal.  Abdomen is soft and nontender with no organomegaly, hernias, masses. GU: Deferred  Extremities:  No cyanosis, clubbing. Neurologic exam: Balance, Rhomberg, finger to nose testing could not be completed due to clinical state Skin: Warm & dry w/o tenting. No significant rash.  See clinical summary under each active problem in the Problem List with associated updated therapeutic plan:  AKI (acute kidney injury) Med list reviewed; no indication for change in medications or dosages.  Traumatic rhabdomyolysis Follow-up CK was 337.  There is a discrepancy in the discharge summary as to whether he was to be on atorvastatin  40 or 80 mg.  Because of the rhabdo; the lipophilic statin will be changed to the hydrophilic statin, rosuvastatin  20 mg daily with subsequent follow-up CK and lipids by PCP.  Orthostatic hypotension Currently blood pressure well-controlled in absence of  the ARB/diuretic and carvedilol .   Monitor blood pressures at the SNF.  Persistent atrial fibrillation (HCC) Rhythm irregular; rate adequately controlled.  Continue apixaban .  Major neurocognitive disorder (HCC) 12/22/2023 he has no comprehension of why he was hospitalized or what diagnoses were made.  As noted, MMSE score was 3 out of 30 indicating severe dementia.  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

## 2023-12-22 NOTE — Patient Instructions (Signed)
 See assessment and plan under each diagnosis in the problem list and acutely for this visit

## 2023-12-22 NOTE — Assessment & Plan Note (Signed)
 Rhythm irregular; rate adequately controlled.  Continue apixaban .

## 2023-12-22 NOTE — Assessment & Plan Note (Signed)
 Currently blood pressure well-controlled in absence of the ARB/diuretic and carvedilol .  Monitor blood pressures at the SNF.

## 2023-12-22 NOTE — Assessment & Plan Note (Signed)
 Med list reviewed; no indication for change in medications or dosages.

## 2023-12-23 ENCOUNTER — Other Ambulatory Visit (HOSPITAL_COMMUNITY): Payer: Self-pay | Admitting: Occupational Therapy

## 2023-12-23 DIAGNOSIS — R1312 Dysphagia, oropharyngeal phase: Secondary | ICD-10-CM

## 2023-12-23 DIAGNOSIS — I639 Cerebral infarction, unspecified: Secondary | ICD-10-CM

## 2024-01-01 ENCOUNTER — Encounter: Payer: Self-pay | Admitting: Adult Health

## 2024-01-01 ENCOUNTER — Ambulatory Visit (HOSPITAL_COMMUNITY): Attending: Adult Health | Admitting: Speech Pathology

## 2024-01-01 ENCOUNTER — Ambulatory Visit (HOSPITAL_COMMUNITY)
Admission: RE | Admit: 2024-01-01 | Discharge: 2024-01-01 | Disposition: A | Discharge status: Home / Self Care | Source: Ambulatory Visit | Attending: Adult Health | Admitting: Adult Health

## 2024-01-01 ENCOUNTER — Non-Acute Institutional Stay (SKILLED_NURSING_FACILITY): Payer: Self-pay | Admitting: Adult Health

## 2024-01-01 ENCOUNTER — Encounter (HOSPITAL_COMMUNITY): Payer: Self-pay | Admitting: Speech Pathology

## 2024-01-01 DIAGNOSIS — I639 Cerebral infarction, unspecified: Secondary | ICD-10-CM | POA: Diagnosis not present

## 2024-01-01 DIAGNOSIS — Z8673 Personal history of transient ischemic attack (TIA), and cerebral infarction without residual deficits: Secondary | ICD-10-CM | POA: Diagnosis not present

## 2024-01-01 DIAGNOSIS — Z794 Long term (current) use of insulin: Secondary | ICD-10-CM

## 2024-01-01 DIAGNOSIS — K219 Gastro-esophageal reflux disease without esophagitis: Secondary | ICD-10-CM | POA: Diagnosis not present

## 2024-01-01 DIAGNOSIS — R1312 Dysphagia, oropharyngeal phase: Secondary | ICD-10-CM | POA: Diagnosis not present

## 2024-01-01 DIAGNOSIS — E11649 Type 2 diabetes mellitus with hypoglycemia without coma: Secondary | ICD-10-CM

## 2024-01-01 NOTE — Progress Notes (Signed)
 Location:  [facility] Room Number: 146 Place of Service:  SNF (31)   CODE STATUS: dnr   No Known Allergies  Chief Complaint  Patient presents with   Acute Visit    Diabetes     HPI:  He has had some low cbg readings in the AM ranging in the upper 40's. He is presently taking humalog  10 units with meals and lantus  12 units twice daily. There are no reports of diaphoresis. There are no reports of excessive hunger or thirst.   Past Medical History:  Diagnosis Date   Atrial fibrillation (HCC)    Cholelithiasis 10/2011   Per CT. No cholecystitis radiographically.   CVA (cerebral infarction) x2, 2012   Residual left hemiparesis and dysarthria   Depression    Diabetes mellitus    GERD (gastroesophageal reflux disease)    Hemorrhoids 2008   Hyperlipidemia    Hypertension    Obesity    [facility] spotted fever) 10/26/2011   IgG positive.   Shortness of breath    exertion   Sleep apnea    cpap   Stroke [facility]) 2010/2013   Right Brain stroke    Past Surgical History:  Procedure Laterality Date   CARDIOVERSION  07/12/2011   Procedure: CARDIOVERSION;  Surgeon: Vinie KYM Maxcy, MD;  Location: [facility] ENDOSCOPY;  Service: Cardiovascular;  Laterality: N/A;   CAROTID DOPPLER STUDY  01/31/2012   no significant extracranial carotid artery stenosis   CATARACT EXTRACTION W/PHACO Left 01/30/2019   Procedure: CATARACT EXTRACTION PHACO AND INTRAOCULAR LENS PLACEMENT (IOC) (CDE: 4.76);  Surgeon: Harrie Agent, MD;  Location: [facility] ORS;  Service: Ophthalmology;  Laterality: Left;   COLONOSCOPY     DOPPLER ECHOCARDIOGRAPHY  01/31/2012   EF 55-60%, moderately calcified annulus of the mitral valve, left atrium demonstrated mild-moderately dilated   KNEE SURGERY     TEE WITHOUT CARDIOVERSION  07/12/2011   Procedure: TRANSESOPHAGEAL ECHOCARDIOGRAM (TEE);  Surgeon: Vinie KYM Maxcy, MD;  Location: [facility] ENDOSCOPY;  Service: Cardiovascular;  Laterality: N/A;  to be done at  1330   TONSILLECTOMY      Social History   Socioeconomic History   Marital status: Married    Spouse name: Dorothy    Number of children: 2   Years of education: 12+   Highest education level: Not on file  Occupational History    Employer: RETIRED  Tobacco Use   Smoking status: Never   Smokeless tobacco: Never  Vaping Use   Vaping status: Never Used  Substance and Sexual Activity   Alcohol  use: Yes    Alcohol /week: 2.0 standard drinks of alcohol     Types: 2 Glasses of wine per week    Comment: occasionally    Drug use: No   Sexual activity: Never  Other Topics Concern   Not on file  Social History Narrative   Patient lives at home wife Naomie. .    Patient has 2 children.    Patient is right handed.    Patient is retired.    Patient has some college.          Social Drivers of Corporate investment banker Strain: Not on file  Food Insecurity: No Food Insecurity (12/17/2023)   Hunger Vital Sign    Worried About Running Out of Food in the Last Year: Never true    Ran Out of Food in the Last Year: Never true  Transportation Needs: No Transportation Needs (12/17/2023)   PRAPARE - Transportation  Lack of Transportation (Medical): No    Lack of Transportation (Non-Medical): No  Physical Activity: Not on file  Stress: Not on file  Social Connections: Moderately Isolated (12/17/2023)   Social Connection and Isolation Panel    Frequency of Communication with Friends and Family: More than three times a week    Frequency of Social Gatherings with Friends and Family: Twice a week    Attends Religious Services: Never    Database administrator or Organizations: No    Attends Banker Meetings: Never    Marital Status: Married  Catering manager Violence: Not At Risk (12/17/2023)   Humiliation, Afraid, Rape, and Kick questionnaire    Fear of Current or Ex-Partner: No    Emotionally Abused: No    Physically Abused: No    Sexually Abused: No   Family History   Problem Relation Age of Onset   Diabetes Mother    Diabetes Son    Pancreatic cancer Other        GRANDFATHER   Colon cancer Other        INTESTINAL, GRANDMOTHER      VITAL SIGNS BP (!) 132/56   Pulse 66   Temp 97.7 F (36.5 C)   Resp 17   Ht 6' (1.829 m)   Wt 187 lb (84.8 kg)   SpO2 96%   BMI 25.36 kg/m   Outpatient Encounter Medications as of 01/01/2024  Medication Sig   apixaban  (ELIQUIS ) 5 MG TABS tablet Take 1 tablet (5 mg total) by mouth 2 (two) times daily.   atorvastatin  (LIPITOR) 40 MG tablet Take 1 tablet (40 mg total) by mouth every evening.   cephALEXin  (KEFLEX ) 500 MG capsule Take 1 capsule (500 mg total) by mouth every 8 (eight) hours. X 5 days   Choline Fenofibrate  (FENOFIBRIC ACID ) 135 MG CPDR Take 135 mg by mouth daily.   Continuous Glucose Sensor (FREESTYLE LIBRE 3 PLUS SENSOR) MISC Change sensor every 15 days.   escitalopram  (LEXAPRO ) 10 MG tablet Take 1 tablet (10 mg total) by mouth daily.   folic acid  (FOLVITE ) 1 MG tablet Take 1 tablet (1 mg total) by mouth daily.   hydrocortisone  (ANUSOL -HC) 2.5 % rectal cream Apply 1 Application topically 4 (four) times daily as needed for hemorrhoids or anal itching.   Insulin  Glargine Solostar (LANTUS ) 100 UNIT/ML Solostar Pen Inject 12 Units into the skin 2 (two) times daily.   Insulin  lispro (HUMALOG  JUNIOR KWIKPEN) 100 UNIT/ML Inject 10 Units into the skin 3 (three) times daily. With meals   linagliptin  (TRADJENTA ) 5 MG TABS tablet Take 1 tablet (5 mg total) by mouth daily.   magnesium  oxide (MAG-OX) 400 (240 Mg) MG tablet Take 1 tablet (400 mg total) by mouth daily.   metoprolol  tartrate (LOPRESSOR ) 25 MG tablet Take 0.5 tablets (12.5 mg total) by mouth 2 (two) times daily.   mirabegron  ER (MYRBETRIQ ) 50 MG TB24 tablet Take 1 tablet (50 mg total) by mouth daily.   ONETOUCH VERIO test strip SMARTSIG:1 Strip(s) Via Meter 5 Times Daily   vitamin B-12 (VITAMIN B12) 500 MCG tablet Take 1 tablet (500 mcg total) by  mouth daily.   No facility-administered encounter medications on file as of 01/01/2024.     SIGNIFICANT DIAGNOSTIC EXAMS  LABS   11-29-23: wbc 5.6; hgb 14.1; hct 42.4; mcv 94.0 plt 156; glucose 135; bun 25; creat 1.19; k+ 3.9; na++ 139; ca 9.3 gfr >60; protein 6.3; albumin 3.7; total bili 1.3; chol 128; ldl 74; trig  52; hdl 44; hgb A1c 7.6   Review of Systems  Constitutional:  Negative for malaise/fatigue.  Respiratory:  Negative for cough and shortness of breath.   Cardiovascular:  Negative for chest pain, palpitations and leg swelling.  Gastrointestinal:  Negative for abdominal pain, constipation and heartburn.  Musculoskeletal:  Negative for back pain, joint pain and myalgias.  Skin: Negative.   Neurological:  Negative for dizziness.  Psychiatric/Behavioral:  The patient is not nervous/anxious.     Physical Exam Constitutional:      General: He is not in acute distress.    Appearance: He is well-developed. He is not diaphoretic.  Neck:     Thyroid : No thyromegaly.  Cardiovascular:     Rate and Rhythm: Normal rate and regular rhythm.     Heart sounds: Normal heart sounds.  Pulmonary:     Effort: Pulmonary effort is normal. No respiratory distress.     Breath sounds: Normal breath sounds.  Abdominal:     General: Bowel sounds are normal. There is no distension.     Palpations: Abdomen is soft.     Tenderness: There is no abdominal tenderness.  Musculoskeletal:        General: Normal range of motion.     Cervical back: Neck supple.     Right lower leg: No edema.     Left lower leg: No edema.  Lymphadenopathy:     Cervical: No cervical adenopathy.  Skin:    General: Skin is warm and dry.  Neurological:     Mental Status: He is alert. Mental status is at baseline.  Psychiatric:        Mood and Affect: Mood normal.      ASSESSMENT/ PLAN:  TODAY  Type 2 diabetes mellitus with hypoglycemia without coma with long term current use of insulin : will change lantus  from  12 units twice daily to 20 units daily and will monitor    Barnie Seip NP [facility]  call [phone]

## 2024-01-01 NOTE — Therapy (Signed)
 [facility] [address] [place], [place], [address] Phone: [phone]   Fax:  [phone]  Modified Barium Swallow  Patient Details  Name: [name] MRN: [id number] Date of Birth: [birth year] No data recorded  Encounter Date: 01/01/2024   End of Session - 01/01/24 1517     Visit Number 1    Number of Visits 1    Activity Tolerance Patient tolerated treatment well          HPI/PMH: HPI: Mr. [name] is an 83 y/o male with pertinent hx of CVA, dysphagia,  type 2 diabetes mellitus, hypertension, GERD, hyperlipidemia, OSA on CPAP, who had recently been discharged from this hospital after being worked up for concern for TIA with aphasia that subsequently resolved.  Pt is currently at [facility] and was downgraded from D3/thin to D2/NT d/t coughing with thin liquids. MBSS requested. MBSS was completed in 2014 with aspiration noted with thin and NTL with recommendation to continue with regular/thin. No other ST notes are present in chart for dysphagia, however multiple notes and evaluations present for speech and language. Pt with baseline cognitive impairment per chart review.   Clinical Impression: Pt presents with mild moderate sensori motor oropharyngeal dysphagia. Oral phase is characterized by slightly decreased coordination and very slow manipulation of bolus, decreased BOT retraction and trace to min oral residue after the swallow. Pharyngeal stage is characterized by delayed swallow trigger, often triggered at the level of the pyriforms, decreased pharyngeal stripping wave and decreased laryngeal vestibule closure. The above pharyngeal impairments result in penetration and silent and sensed aspiration of thin liquids. With smaller sips of thin liquids note frank flash penetration and with larger presentations note consistent aspiration; Small amounts of aspiration were silent (PAS 8) and larger volume of aspiration was sensed and  reflexive cough was not effective in clearing aspirates (PAS 7). Unfortunately Pt is cognitively unable to complete any of the attempted compensatory strategies. With NTL note occasional flash penetration. With HTL note significantly increased profuse pharyngeal residue. puree and regular textures were tolerated with prolonged mastication and mild to mod pharyngeal residue. Attempted barium tablet with NTL which Pt masticated and eventually expectorated. Recommend continue with D2/fine chopped diet and NECTAR thick liquids. Recommend crush meds in puree. Recommend slow rate and alternating bites and sips. Pt is a good candidate for free water protocol and continued water trials with SLP. If Pt could self-regulate small sips upgrade to thin liquids is possible, however given cognitive impairment suspect this is unrealistic without Daniel cues and supervision. Provided hand out with recommendations to return to facility.  Factors that may increase risk of adverse event in presence of aspiration Noe & Lianne 2021): No data recorded  Recommendations/Plan: Swallowing Evaluation Recommendations Swallowing Evaluation Recommendations Recommendations: PO diet PO Diet Recommendation: Dysphagia 2 (Finely chopped); Mildly thick liquids (Level 2, nectar thick) Liquid Administration via: Cup; Straw Medication Administration: Crushed with puree Swallowing strategies  : Slow rate; Small bites/sips; Follow solids with liquids Postural changes: Position pt fully upright for meals Oral care recommendations: Oral care BID (2x/day)    Treatment Plan No data recorded   Recommendations Recommendations for follow up therapy are one component of a multi-disciplinary discharge planning process, led by the attending physician.  Recommendations may be updated based on patient status, additional functional criteria and insurance authorization.  Assessment: Orofacial Exam: Orofacial Exam Oral Cavity: Oral  Hygiene: WFL Oral Cavity - Dentition: Adequate natural dentition Orofacial Anatomy: [facility]  Oral Motor/Sensory Function: WFL    Anatomy:  Anatomy: WFL   Boluses Administered: Boluses Administered Boluses Administered: Thin liquids (Level 0); Moderately thick liquids (Level 3, honey thick); Mildly thick liquids (Level 2, nectar thick); Puree; Solid     Oral Impairment Domain: Oral Impairment Domain Lip Closure: No labial escape Tongue control during bolus hold: Cohesive bolus between tongue to palatal seal Bolus preparation/mastication: Timely and efficient chewing and mashing Bolus transport/lingual motion: Slow tongue motion Oral residue: Trace residue lining oral structures Location of oral residue : Tongue Initiation of pharyngeal swallow : Pyriform sinuses     Pharyngeal Impairment Domain: Pharyngeal Impairment Domain Soft palate elevation: Trace column of contrast or air between SP and PW Laryngeal elevation: Partial superior movement of thyroid  cartilage/partial approximation of arytenoids to epiglottic petiole Anterior hyoid excursion: Partial anterior movement Epiglottic movement: Partial inversion Laryngeal vestibule closure: Incomplete, narrow column air/contrast in laryngeal vestibule Pharyngeal stripping wave : Present - diminished Pharyngeal contraction (A/P view only): N/A Pharyngoesophageal segment opening: Partial distention/partial duration, partial obstruction of flow Tongue base retraction: Trace column of contrast or air between tongue base and PPW; Narrow column of contrast or air between tongue base and PPW Pharyngeal residue: Collection of residue within or on pharyngeal structures Location of pharyngeal residue: Diffuse (>3 areas); Pyriform sinuses; Aryepiglottic folds; Pharyngeal wall; Valleculae; Tongue base     Esophageal Impairment Domain: No data recorded  Pill: Pill Consistency administered: Mildly thick liquids (Level 2, nectar  thick)    Penetration/Aspiration Scale Score: Penetration/Aspiration Scale Score 1.  Material does not enter airway: Moderately thick liquids (Level 3, honey thick); Puree; Solid; Pill 2.  Material enters airway, remains ABOVE vocal cords then ejected out: Mildly thick liquids (Level 2, nectar thick); Thin liquids (Level 0) 3.  Material enters airway, remains ABOVE vocal cords and not ejected out: Thin liquids (Level 0) 4.  Material enters airway, CONTACTS cords then ejected out: Thin liquids (Level 0) 5.  Material enters airway, CONTACTS cords and not ejected out: Thin liquids (Level 0) 6.  Material enters airway, passes BELOW cords then ejected out: Thin liquids (Level 0) 7.  Material enters airway, passes BELOW cords and not ejected out despite cough attempt by patient: Thin liquids (Level 0) 8.  Material enters airway, passes BELOW cords without attempt by patient to eject out (silent aspiration) : Thin liquids (Level 0)    Compensatory Strategies: Compensatory Strategies Compensatory strategies: Yes Chin tuck: Ineffective       General Information: Caregiver present: No   Diet Prior to this Study: Dysphagia 2 (finely chopped); Mildly thick liquids (Level 2, nectar thick)    Temperature : Normal    Respiratory Status: WFL    Supplemental O2: None (Room air)    History of Recent Intubation: No   Behavior/Cognition: Alert; Cooperative; Pleasant mood  Self-Feeding Abilities: Able to self-feed; Needs set-up for self-feeding; Needs assist with self-feeding  Baseline vocal quality/speech: Normal; Hypophonia/low volume  Volitional Cough: Able to elicit  Volitional Swallow: Able to elicit  No data recorded  Goal Planning: No data recorded No data recorded No data recorded No data recorded No data recorded  Pain: No data recorded  End of Session: Start Time:No data recorded Stop Time: No data recorded Time Calculation:No data recorded Charges: No data  recorded SLP visit diagnosis: SLP Visit Diagnosis: Dysphagia, unspecified (R13.10)    Past Medical History:  Past Medical History:  Diagnosis Date   Atrial fibrillation (HCC)    Cholelithiasis 10/2011  Per CT. No cholecystitis radiographically.   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   Shortness of breath    exertion   Sleep apnea    cpap   Stroke [facility]) 2010/2013   Right Brain stroke   Past Surgical History:  Past Surgical History:  Procedure Laterality Date   CARDIOVERSION  07/12/2011   Procedure: CARDIOVERSION;  Surgeon: Vinie KYM Maxcy, MD;  Location: [facility] ENDOSCOPY;  Service: Cardiovascular;  Laterality: N/A;   CAROTID DOPPLER STUDY  01/31/2012   no significant extracranial carotid artery stenosis   CATARACT EXTRACTION W/PHACO Left 01/30/2019   Procedure: CATARACT EXTRACTION PHACO AND INTRAOCULAR LENS PLACEMENT (IOC) (CDE: 4.76);  Surgeon: Harrie Agent, MD;  Location: [facility] ORS;  Service: Ophthalmology;  Laterality: Left;   COLONOSCOPY     DOPPLER ECHOCARDIOGRAPHY  01/31/2012   EF 55-60%, moderately calcified annulus of the mitral valve, left atrium demonstrated mild-moderately dilated   KNEE SURGERY     TEE WITHOUT CARDIOVERSION  07/12/2011   Procedure: TRANSESOPHAGEAL ECHOCARDIOGRAM (TEE);  Surgeon: Vinie KYM Maxcy, MD;  Location: [facility] ENDOSCOPY;  Service: Cardiovascular;  Laterality: N/A;  to be done at 1330   TONSILLECTOMY     Charlott Calvario H. Clois KILLIAN, CCC-SLP Speech Language Pathologist  Raguel VEAR Clois 01/01/2024, 3:18 PM

## 2024-01-03 ENCOUNTER — Non-Acute Institutional Stay (SKILLED_NURSING_FACILITY): Payer: Self-pay | Admitting: Adult Health

## 2024-01-03 ENCOUNTER — Encounter: Payer: Self-pay | Admitting: Adult Health

## 2024-01-03 DIAGNOSIS — I152 Hypertension secondary to endocrine disorders: Secondary | ICD-10-CM

## 2024-01-03 DIAGNOSIS — E1159 Type 2 diabetes mellitus with other circulatory complications: Secondary | ICD-10-CM | POA: Diagnosis not present

## 2024-01-03 DIAGNOSIS — F0390 Unspecified dementia without behavioral disturbance: Secondary | ICD-10-CM | POA: Diagnosis not present

## 2024-01-03 DIAGNOSIS — I4819 Other persistent atrial fibrillation: Secondary | ICD-10-CM

## 2024-01-03 NOTE — Progress Notes (Signed)
 Location:  [facility] Room Number: 128-P Place of Service:  SNF (31)   CODE STATUS: dnr   No Known Allergies  Chief Complaint  Patient presents with   Care plan meeting    HPI:  We have come together for his care plan meeting. Family present. BIMS 4/15 mood 6/30: decreased energy, nervous at times. Uses wheelchair without falls. He requires moderate to dependent assist with his adl care. He is frequently incontinent of bladder and bowel. Dietary: setup for meals diet is D2 NT with appetite 1-50% with magic cup with supper; weight is 187 pounds. Therapy: ambulate 50 feet with rolling walker and contact guard assist; upper body min assist moderate assist with lower body. Brp:mod assist. He will continue to be followed for his chronic illnesses including:   Persistent atrial fibrillation  Major neurocognitive disorder   Hypertension associated with type 2 diabetes  Past Medical History:  Diagnosis Date   Atrial fibrillation (HCC)    Cholelithiasis 10/2011   Per CT. No cholecystitis radiographically.   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   Shortness of breath    exertion   Sleep apnea    cpap   Stroke [facility]) 2010/2013   Right Brain stroke    Past Surgical History:  Procedure Laterality Date   CARDIOVERSION  07/12/2011   Procedure: CARDIOVERSION;  Surgeon: Vinie KYM Maxcy, MD;  Location: [facility] ENDOSCOPY;  Service: Cardiovascular;  Laterality: N/A;   CAROTID DOPPLER STUDY  01/31/2012   no significant extracranial carotid artery stenosis   CATARACT EXTRACTION W/PHACO Left 01/30/2019   Procedure: CATARACT EXTRACTION PHACO AND INTRAOCULAR LENS PLACEMENT (IOC) (CDE: 4.76);  Surgeon: Harrie Agent, MD;  Location:  [facility];  Service: Ophthalmology;  Laterality: Left;   COLONOSCOPY     DOPPLER ECHOCARDIOGRAPHY  01/31/2012   EF 55-60%, moderately calcified annulus of the mitral valve, left atrium demonstrated mild-moderately dilated   KNEE SURGERY     TEE WITHOUT CARDIOVERSION  07/12/2011   Procedure: TRANSESOPHAGEAL ECHOCARDIOGRAM (TEE);  Surgeon: Vinie KYM Maxcy, MD;  Location: [facility] ENDOSCOPY;  Service: Cardiovascular;  Laterality: N/A;  to be done at 1330   TONSILLECTOMY      Social History   Socioeconomic History   Marital status: Married    Spouse name: Dorothy    Number of children: 2   Years of education: 12+   Highest education level: Not on file  Occupational History    Employer: RETIRED  Tobacco Use   Smoking status: Never   Smokeless tobacco: Never  Vaping Use   Vaping status: Never Used  Substance and Sexual Activity   Alcohol  use: Yes    Alcohol /week: 2.0 standard drinks of alcohol     Types: 2 Glasses of wine per week    Comment: occasionally    Drug use: No   Sexual activity: Never  Other Topics Concern   Not on file  Social History Narrative   Patient lives at home wife Naomie. .    Patient has 2 children.    Patient is right handed.    Patient is retired.    Patient has some college.          Social Drivers  of Health   Financial Resource Strain: Not on file  Food Insecurity: No Food Insecurity (12/17/2023)   Hunger Vital Sign    Worried About Running Out of Food in the Last Year: Never true    Ran Out of Food in the Last Year: Never true  Transportation Needs: No Transportation Needs (12/17/2023)   PRAPARE - Administrator, Civil Service (Medical): No    Lack of Transportation (Non-Medical): No  Physical Activity: Not on file  Stress: Not on file  Social Connections: Moderately Isolated (12/17/2023)   Social Connection and Isolation Panel    Frequency of Communication with Friends and Family: More than three times a week    Frequency of Social  Gatherings with Friends and Family: Twice a week    Attends Religious Services: Never    Database administrator or Organizations: No    Attends Banker Meetings: Never    Marital Status: Married  Catering manager Violence: Not At Risk (12/17/2023)   Humiliation, Afraid, Rape, and Kick questionnaire    Fear of Current or Ex-Partner: No    Emotionally Abused: No    Physically Abused: No    Sexually Abused: No   Family History  Problem Relation Age of Onset   Diabetes Mother    Diabetes Son    Pancreatic cancer Other        GRANDFATHER   Colon cancer Other        INTESTINAL, GRANDMOTHER      VITAL SIGNS BP 98/69   Pulse 76   Temp 97.8 F (36.6 C)   Resp 20   Ht 6' (1.829 m)   Wt 180 lb 3.2 oz (81.7 kg)   SpO2 96%   BMI 24.44 kg/m   Outpatient Encounter Medications as of 01/03/2024  Medication Sig   apixaban  (ELIQUIS ) 5 MG TABS tablet Take 1 tablet (5 mg total) by mouth 2 (two) times daily.   Choline Fenofibrate  (FENOFIBRIC ACID ) 135 MG CPDR Take 135 mg by mouth daily.   cyanocobalamin  (VITAMIN B12) 1000 MCG tablet Take 1,000 mcg by mouth daily.   escitalopram  (LEXAPRO ) 10 MG tablet Take 1 tablet (10 mg total) by mouth daily.   folic acid  (FOLVITE ) 1 MG tablet Take 1 tablet (1 mg total) by mouth daily.   atorvastatin  (LIPITOR) 40 MG tablet Take 1 tablet (40 mg total) by mouth every evening.   cephALEXin  (KEFLEX ) 500 MG capsule Take 1 capsule (500 mg total) by mouth every 8 (eight) hours. X 5 days   Continuous Glucose Sensor (FREESTYLE LIBRE 3 PLUS SENSOR) MISC Change sensor every 15 days.   hydrocortisone  (ANUSOL -HC) 2.5 % rectal cream Apply 1 Application topically 4 (four) times daily as needed for hemorrhoids or anal itching.   Insulin  Glargine Solostar (LANTUS ) 100 UNIT/ML Solostar Pen Inject 12 Units into the skin 2 (two) times daily.   Insulin  lispro (HUMALOG  JUNIOR KWIKPEN) 100 UNIT/ML Inject 10 Units into the skin 3 (three) times daily. With meals    linagliptin  (TRADJENTA ) 5 MG TABS tablet Take 1 tablet (5 mg total) by mouth daily.   magnesium  oxide (MAG-OX) 400 (240 Mg) MG tablet Take 1 tablet (400 mg total) by mouth daily.   metoprolol  tartrate (LOPRESSOR ) 25 MG tablet Take 0.5 tablets (12.5 mg total) by mouth 2 (two) times daily.   mirabegron  ER (MYRBETRIQ ) 50 MG TB24 tablet Take 1 tablet (50 mg total) by mouth daily.   ONETOUCH VERIO test strip SMARTSIG:1 Strip(s) Via Meter  5 Times Daily   vitamin B-12 (VITAMIN B12) 500 MCG tablet Take 1 tablet (500 mcg total) by mouth daily. (Patient not taking: Reported on 01/03/2024)   No facility-administered encounter medications on file as of 01/03/2024.     SIGNIFICANT DIAGNOSTIC EXAMS  Review of Systems  Constitutional:  Negative for malaise/fatigue.  Respiratory:  Negative for cough and shortness of breath.   Cardiovascular:  Negative for chest pain, palpitations and leg swelling.  Gastrointestinal:  Negative for abdominal pain, constipation and heartburn.  Musculoskeletal:  Negative for back pain, joint pain and myalgias.  Skin: Negative.   Neurological:  Negative for dizziness.  Psychiatric/Behavioral:  The patient is not nervous/anxious.     Physical Exam Constitutional:      General: He is not in acute distress.    Appearance: He is well-developed. He is not diaphoretic.  Neck:     Thyroid : No thyromegaly.  Cardiovascular:     Rate and Rhythm: Normal rate and regular rhythm.     Heart sounds: Normal heart sounds.  Pulmonary:     Effort: Pulmonary effort is normal. No respiratory distress.     Breath sounds: Normal breath sounds.  Abdominal:     General: Bowel sounds are normal. There is no distension.     Palpations: Abdomen is soft.     Tenderness: There is no abdominal tenderness.  Musculoskeletal:        General: Normal range of motion.     Cervical back: Neck supple.     Right lower leg: No edema.     Left lower leg: No edema.  Lymphadenopathy:     Cervical: No  cervical adenopathy.  Skin:    General: Skin is warm and dry.  Neurological:     Mental Status: He is alert. Mental status is at baseline.  Psychiatric:        Mood and Affect: Mood normal.     ASSESSMENT/ PLAN:  TODAY  Persistent atrial fibrillation Major neurocognitive disorder Hypertension associated with type 2 diabetes  Will continue current medications Will continue therapy as directed Will continue to monitor his status.   Time spent with patient: 40 minutes medications; therapy; dietary.     Barnie Seip NP [facility]   call [phone]

## 2024-01-03 NOTE — Progress Notes (Signed)
 Location:  [facility] Room Number: 128-P Place of Service:  SNF (31) Provider: Barnie Seip, NP  CODE STATUS: DNR  No Known Allergies  Chief Complaint  Patient presents with   Care plan meeting    HPI:    Past Medical History:  Diagnosis Date   Atrial fibrillation (HCC)    Cholelithiasis 10/2011   Per CT. No cholecystitis radiographically.   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   Shortness of breath    exertion   Sleep apnea    cpap   Stroke [facility]) 2010/2013   Right Brain stroke    Past Surgical History:  Procedure Laterality Date   CARDIOVERSION  07/12/2011   Procedure: CARDIOVERSION;  Surgeon: Vinie KYM Maxcy, MD;  Location: [facility] ENDOSCOPY;  Service: Cardiovascular;  Laterality: N/A;   CAROTID DOPPLER STUDY  01/31/2012   no significant extracranial carotid artery stenosis   CATARACT EXTRACTION W/PHACO Left 01/30/2019   Procedure: CATARACT EXTRACTION PHACO AND INTRAOCULAR LENS PLACEMENT (IOC) (CDE: 4.76);  Surgeon: Harrie Agent, MD;  Location: [facility] ORS;  Service: Ophthalmology;  Laterality: Left;   COLONOSCOPY     DOPPLER ECHOCARDIOGRAPHY  01/31/2012   EF 55-60%, moderately calcified annulus of the mitral valve, left atrium demonstrated mild-moderately dilated   KNEE SURGERY     TEE WITHOUT CARDIOVERSION  07/12/2011   Procedure: TRANSESOPHAGEAL ECHOCARDIOGRAM (TEE);  Surgeon: Vinie KYM Maxcy, MD;  Location: [facility] ENDOSCOPY;  Service: Cardiovascular;  Laterality: N/A;  to be done at 1330   TONSILLECTOMY      Social History   Socioeconomic History   Marital status: Married    Spouse name: Dorothy    Number of children: 2   Years of education: 12+   Highest education level: Not on file   Occupational History    Employer: RETIRED  Tobacco Use   Smoking status: Never   Smokeless tobacco: Never  Vaping Use   Vaping status: Never Used  Substance and Sexual Activity   Alcohol  use: Yes    Alcohol /week: 2.0 standard drinks of alcohol     Types: 2 Glasses of wine per week    Comment: occasionally    Drug use: No   Sexual activity: Never  Other Topics Concern   Not on file  Social History Narrative   Patient lives at home wife Naomie. .    Patient has 2 children.    Patient is right handed.    Patient is retired.    Patient has some college.          Social Drivers of Corporate investment banker Strain: Not on file  Food Insecurity: No Food Insecurity (12/17/2023)   Hunger Vital Sign    Worried About Running Out of Food in the Last Year: Never true    Ran Out of Food in the Last Year: Never true  Transportation Needs: No Transportation Needs (12/17/2023)   PRAPARE - Administrator, Civil Service (Medical): No    Lack of Transportation (Non-Medical): No  Physical Activity: Not on file  Stress: Not on file  Social Connections: Moderately Isolated (12/17/2023)   Social Connection and Isolation Panel    Frequency of Communication with Friends  and Family: More than three times a week    Frequency of Social Gatherings with Friends and Family: Twice a week    Attends Religious Services: Never    Database administrator or Organizations: No    Attends Banker Meetings: Never    Marital Status: Married  Catering manager Violence: Not At Risk (12/17/2023)   Humiliation, Afraid, Rape, and Kick questionnaire    Fear of Current or Ex-Partner: No    Emotionally Abused: No    Physically Abused: No    Sexually Abused: No   Family History  Problem Relation Age of Onset   Diabetes Mother    Diabetes Son    Pancreatic cancer Other        GRANDFATHER   Colon cancer Other        INTESTINAL, GRANDMOTHER      VITAL SIGNS BP 98/69   Pulse 76    Temp 97.8 F (36.6 C)   Resp 20   Ht 6' (1.829 m)   Wt 180 lb 3.2 oz (81.7 kg)   SpO2 96%   BMI 24.44 kg/m   Outpatient Encounter Medications as of 01/03/2024  Medication Sig   apixaban  (ELIQUIS ) 5 MG TABS tablet Take 1 tablet (5 mg total) by mouth 2 (two) times daily.   Choline Fenofibrate  (FENOFIBRIC ACID ) 135 MG CPDR Take 135 mg by mouth daily.   collagenase (SANTYL) 250 UNIT/GM ointment Apply 1 Application topically daily.   cyanocobalamin  (VITAMIN B12) 1000 MCG tablet Take 1,000 mcg by mouth daily.   escitalopram  (LEXAPRO ) 10 MG tablet Take 1 tablet (10 mg total) by mouth daily.   folic acid  (FOLVITE ) 1 MG tablet Take 1 tablet (1 mg total) by mouth daily.   hydrocortisone  (ANUSOL -HC) 2.5 % rectal cream Apply 1 Application topically 4 (four) times daily as needed for hemorrhoids or anal itching.   insulin  glargine (LANTUS ) 100 UNIT/ML injection Inject 20 Units into the skin daily.   Insulin  Glargine Solostar (LANTUS ) 100 UNIT/ML Solostar Pen Inject 12 Units into the skin 2 (two) times daily.   Insulin  lispro (HUMALOG  JUNIOR KWIKPEN) 100 UNIT/ML Inject 10 Units into the skin 3 (three) times daily. With meals   linagliptin  (TRADJENTA ) 5 MG TABS tablet Take 1 tablet (5 mg total) by mouth daily.   magnesium  oxide (MAG-OX) 400 (240 Mg) MG tablet Take 1 tablet (400 mg total) by mouth daily.   metoprolol  tartrate (LOPRESSOR ) 25 MG tablet Take 0.5 tablets (12.5 mg total) by mouth 2 (two) times daily.   mirabegron  ER (MYRBETRIQ ) 50 MG TB24 tablet Take 1 tablet (50 mg total) by mouth daily.   rosuvastatin  (CRESTOR ) 20 MG tablet Take 20 mg by mouth daily.   atorvastatin  (LIPITOR) 40 MG tablet Take 1 tablet (40 mg total) by mouth every evening. (Patient not taking: Reported on 01/03/2024)   cephALEXin  (KEFLEX ) 500 MG capsule Take 1 capsule (500 mg total) by mouth every 8 (eight) hours. X 5 days (Patient not taking: Reported on 01/03/2024)   Continuous Glucose Sensor (FREESTYLE LIBRE 3 PLUS SENSOR)  MISC Change sensor every 15 days.   ONETOUCH VERIO test strip SMARTSIG:1 Strip(s) Via Meter 5 Times Daily (Patient not taking: Reported on 01/03/2024)   vitamin B-12 (VITAMIN B12) 500 MCG tablet Take 1 tablet (500 mcg total) by mouth daily. (Patient not taking: Reported on 01/03/2024)   No facility-administered encounter medications on file as of 01/03/2024.     SIGNIFICANT DIAGNOSTIC EXAMS       ASSESSMENT/  PLAN:     Barnie Seip NP [facility] Adult Medicine  Contact [phone] Monday through Friday 8am- 5pm  After hours call [phone]

## 2024-01-09 DIAGNOSIS — A770 Spotted fever due to Rickettsia rickettsii: Secondary | ICD-10-CM | POA: Diagnosis not present

## 2024-01-09 DIAGNOSIS — R4701 Aphasia: Secondary | ICD-10-CM | POA: Diagnosis not present

## 2024-01-09 DIAGNOSIS — E669 Obesity, unspecified: Secondary | ICD-10-CM | POA: Diagnosis not present

## 2024-01-09 DIAGNOSIS — E1159 Type 2 diabetes mellitus with other circulatory complications: Secondary | ICD-10-CM | POA: Diagnosis not present

## 2024-01-09 DIAGNOSIS — N390 Urinary tract infection, site not specified: Secondary | ICD-10-CM | POA: Diagnosis not present

## 2024-01-09 DIAGNOSIS — M6282 Rhabdomyolysis: Secondary | ICD-10-CM | POA: Diagnosis not present

## 2024-01-09 DIAGNOSIS — G459 Transient cerebral ischemic attack, unspecified: Secondary | ICD-10-CM | POA: Diagnosis not present

## 2024-01-09 DIAGNOSIS — F339 Major depressive disorder, recurrent, unspecified: Secondary | ICD-10-CM | POA: Diagnosis not present

## 2024-01-09 DIAGNOSIS — I951 Orthostatic hypotension: Secondary | ICD-10-CM | POA: Diagnosis not present

## 2024-01-10 DIAGNOSIS — M6282 Rhabdomyolysis: Secondary | ICD-10-CM | POA: Diagnosis not present

## 2024-01-10 DIAGNOSIS — G459 Transient cerebral ischemic attack, unspecified: Secondary | ICD-10-CM | POA: Diagnosis not present

## 2024-01-10 DIAGNOSIS — R4701 Aphasia: Secondary | ICD-10-CM | POA: Diagnosis not present

## 2024-01-10 DIAGNOSIS — I951 Orthostatic hypotension: Secondary | ICD-10-CM | POA: Diagnosis not present

## 2024-01-10 DIAGNOSIS — F339 Major depressive disorder, recurrent, unspecified: Secondary | ICD-10-CM | POA: Diagnosis not present

## 2024-01-10 DIAGNOSIS — N390 Urinary tract infection, site not specified: Secondary | ICD-10-CM | POA: Diagnosis not present

## 2024-01-10 DIAGNOSIS — E669 Obesity, unspecified: Secondary | ICD-10-CM | POA: Diagnosis not present

## 2024-01-10 DIAGNOSIS — E1159 Type 2 diabetes mellitus with other circulatory complications: Secondary | ICD-10-CM | POA: Diagnosis not present

## 2024-01-10 DIAGNOSIS — A770 Spotted fever due to Rickettsia rickettsii: Secondary | ICD-10-CM | POA: Diagnosis not present

## 2024-01-13 DIAGNOSIS — A770 Spotted fever due to Rickettsia rickettsii: Secondary | ICD-10-CM | POA: Diagnosis not present

## 2024-01-13 DIAGNOSIS — N390 Urinary tract infection, site not specified: Secondary | ICD-10-CM | POA: Diagnosis not present

## 2024-01-13 DIAGNOSIS — E1159 Type 2 diabetes mellitus with other circulatory complications: Secondary | ICD-10-CM | POA: Diagnosis not present

## 2024-01-13 DIAGNOSIS — G459 Transient cerebral ischemic attack, unspecified: Secondary | ICD-10-CM | POA: Diagnosis not present

## 2024-01-13 DIAGNOSIS — E669 Obesity, unspecified: Secondary | ICD-10-CM | POA: Diagnosis not present

## 2024-01-13 DIAGNOSIS — F339 Major depressive disorder, recurrent, unspecified: Secondary | ICD-10-CM | POA: Diagnosis not present

## 2024-01-13 DIAGNOSIS — R4701 Aphasia: Secondary | ICD-10-CM | POA: Diagnosis not present

## 2024-01-13 DIAGNOSIS — M6282 Rhabdomyolysis: Secondary | ICD-10-CM | POA: Diagnosis not present

## 2024-01-13 DIAGNOSIS — I951 Orthostatic hypotension: Secondary | ICD-10-CM | POA: Diagnosis not present

## 2024-01-14 ENCOUNTER — Encounter: Payer: Self-pay | Admitting: Adult Health

## 2024-01-14 ENCOUNTER — Other Ambulatory Visit: Payer: Self-pay | Admitting: Adult Health

## 2024-01-14 ENCOUNTER — Non-Acute Institutional Stay (SKILLED_NURSING_FACILITY): Payer: Self-pay | Admitting: Adult Health

## 2024-01-14 ENCOUNTER — Telehealth: Payer: Self-pay

## 2024-01-14 DIAGNOSIS — F0390 Unspecified dementia without behavioral disturbance: Secondary | ICD-10-CM | POA: Diagnosis not present

## 2024-01-14 DIAGNOSIS — I480 Paroxysmal atrial fibrillation: Secondary | ICD-10-CM | POA: Diagnosis not present

## 2024-01-14 DIAGNOSIS — F339 Major depressive disorder, recurrent, unspecified: Secondary | ICD-10-CM

## 2024-01-14 DIAGNOSIS — E1159 Type 2 diabetes mellitus with other circulatory complications: Secondary | ICD-10-CM

## 2024-01-14 DIAGNOSIS — A770 Spotted fever due to Rickettsia rickettsii: Secondary | ICD-10-CM | POA: Diagnosis not present

## 2024-01-14 DIAGNOSIS — M6282 Rhabdomyolysis: Secondary | ICD-10-CM | POA: Diagnosis not present

## 2024-01-14 DIAGNOSIS — I152 Hypertension secondary to endocrine disorders: Secondary | ICD-10-CM | POA: Diagnosis not present

## 2024-01-14 DIAGNOSIS — E669 Obesity, unspecified: Secondary | ICD-10-CM | POA: Diagnosis not present

## 2024-01-14 DIAGNOSIS — G459 Transient cerebral ischemic attack, unspecified: Secondary | ICD-10-CM | POA: Diagnosis not present

## 2024-01-14 DIAGNOSIS — I951 Orthostatic hypotension: Secondary | ICD-10-CM | POA: Diagnosis not present

## 2024-01-14 DIAGNOSIS — R4701 Aphasia: Secondary | ICD-10-CM | POA: Diagnosis not present

## 2024-01-14 MED ORDER — FENOFIBRIC ACID 135 MG PO CPDR: 135.0000 mg | DELAYED_RELEASE_CAPSULE | Freq: Every day | ORAL | 0 refills | Status: DC

## 2024-01-14 MED ORDER — APIXABAN 5 MG PO TABS: 5.0000 mg | ORAL_TABLET | Freq: Two times a day (BID) | ORAL | 0 refills | Status: AC

## 2024-01-14 MED ORDER — MAGNESIUM OXIDE -MG SUPPLEMENT 400 (240 MG) MG PO TABS: 400.0000 mg | ORAL_TABLET | Freq: Every day | ORAL | 0 refills | Status: AC

## 2024-01-14 MED ORDER — HUMALOG JUNIOR KWIKPEN 100 UNIT/ML ~~LOC~~ SOPN: 10.0000 [IU] | PEN_INJECTOR | Freq: Three times a day (TID) | SUBCUTANEOUS | 0 refills | Status: DC

## 2024-01-14 MED ORDER — ESCITALOPRAM OXALATE 10 MG PO TABS: 10.0000 mg | ORAL_TABLET | Freq: Every day | ORAL | 0 refills | Status: AC

## 2024-01-14 MED ORDER — MIRABEGRON ER 50 MG PO TB24: 50.0000 mg | ORAL_TABLET | Freq: Every day | ORAL | 0 refills | Status: DC

## 2024-01-14 MED ORDER — COLLAGENASE 250 UNIT/GM EX OINT: 1.0000 | TOPICAL_OINTMENT | Freq: Every day | CUTANEOUS | 0 refills | Status: DC

## 2024-01-14 MED ORDER — INSULIN GLARGINE 100 UNIT/ML ~~LOC~~ SOLN: 20.0000 [IU] | Freq: Every day | SUBCUTANEOUS | 0 refills | Status: DC

## 2024-01-14 MED ORDER — FOLIC ACID 1 MG PO TABS: 1.0000 mg | ORAL_TABLET | Freq: Every day | ORAL | 0 refills | Status: AC

## 2024-01-14 MED ORDER — ROSUVASTATIN CALCIUM 20 MG PO TABS: 20.0000 mg | ORAL_TABLET | Freq: Every day | ORAL | 0 refills | Status: DC

## 2024-01-14 MED ORDER — METOPROLOL TARTRATE 25 MG PO TABS: 12.5000 mg | ORAL_TABLET | Freq: Two times a day (BID) | ORAL | 0 refills | Status: AC

## 2024-01-14 MED ORDER — LINAGLIPTIN 5 MG PO TABS: 5.0000 mg | ORAL_TABLET | Freq: Every day | ORAL | 0 refills | Status: AC

## 2024-01-14 NOTE — Telephone Encounter (Signed)
 Message sent to Landy Barnie RAMAN, NP. Please Advise

## 2024-01-14 NOTE — Telephone Encounter (Signed)
 Copied from CRM [id number]. Topic: Clinical - Prescription Issue >> Jan 14, 2024  2:14 PM Farrel B wrote: Reason for CRM: Insulin  lispro (HUMALOG  JUNIOR [facility]) 100 UNIT/ML, Ms [name] of [facility] [phone] has called to advise the proved that this medication sent in by provider at [facility] on 01/14/24 is not covered by insurance. She is needing to know if this prescription can be changed over to Novalog for the patient per the request of the insurance company.

## 2024-01-14 NOTE — Telephone Encounter (Signed)
 Medication collagenase (SANTYL) 250 UNIT/GM ointment   [facility] wants Barnie Seip NP to prove the wound dimensions so medication can be filled. Routing to provider who requesting medication.

## 2024-01-14 NOTE — Progress Notes (Signed)
 Location:  [facility] Room Number: 128 Place of Service:  SNF (31)   CODE STATUS: dnr  No Known Allergies  Chief Complaint  Patient presents with   Discharge Note        HPI:  He is being discharged to home with home health for pt/ot. He will not need any dme. Will need prescriptions written and will need to follow up with his medical provider. He had been hospitalized 9/16 - 12/20/2023 after mechanical fall from his bed with associated complaints of mid-lower back pain. This was in the context of having been in this facility from 8/30 - 9/12 for rehab following hospitalization 8/29 - 8/30 also related to a fall from bed. He was admitted to this facility for short term rehab. Therapy: ambulate 80 feet with walker and contact guard; upper body min assist lower body mod assist; brp; mod assist.   Past Medical History:  Diagnosis Date   Atrial fibrillation (HCC)    Cholelithiasis 10/2011   Per CT. No cholecystitis radiographically.   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   Shortness of breath    exertion   Sleep apnea    cpap   Stroke [facility]) 2010/2013   Right Brain stroke    Past Surgical History:  Procedure Laterality Date   CARDIOVERSION  07/12/2011   Procedure: CARDIOVERSION;  Surgeon: Vinie KYM Maxcy, MD;  Location: [facility] ENDOSCOPY;  Service: Cardiovascular;  Laterality: N/A;   CAROTID DOPPLER STUDY  01/31/2012   no significant extracranial carotid artery stenosis   CATARACT EXTRACTION W/PHACO Left 01/30/2019   Procedure: CATARACT EXTRACTION PHACO AND INTRAOCULAR LENS PLACEMENT (IOC) (CDE: 4.76);  Surgeon: Harrie Agent, MD;  Location: [facility] ORS;  Service: Ophthalmology;  Laterality: Left;   COLONOSCOPY      DOPPLER ECHOCARDIOGRAPHY  01/31/2012   EF 55-60%, moderately calcified annulus of the mitral valve, left atrium demonstrated mild-moderately dilated   KNEE SURGERY     TEE WITHOUT CARDIOVERSION  07/12/2011   Procedure: TRANSESOPHAGEAL ECHOCARDIOGRAM (TEE);  Surgeon: Vinie KYM Maxcy, MD;  Location: [facility] ENDOSCOPY;  Service: Cardiovascular;  Laterality: N/A;  to be done at 1330   TONSILLECTOMY      Social History   Socioeconomic History   Marital status: Married    Spouse name: Dorothy    Number of children: 2   Years of education: 12+   Highest education level: Not on file  Occupational History    Employer: RETIRED  Tobacco Use   Smoking status: Never   Smokeless tobacco: Never  Vaping Use   Vaping status: Never Used  Substance and Sexual Activity   Alcohol  use: Yes    Alcohol /week: 2.0 standard drinks of alcohol     Types: 2 Glasses of wine per week    Comment: occasionally    Drug use: No   Sexual activity: Never  Other Topics Concern   Not on file  Social History Narrative   Patient lives at home wife Naomie. .    Patient has 2 children.    Patient is right handed.    Patient is retired.    Patient has some college.          Social  Drivers of Corporate investment banker Strain: Not on file  Food Insecurity: No Food Insecurity (12/17/2023)   Hunger Vital Sign    Worried About Running Out of Food in the Last Year: Never true    Ran Out of Food in the Last Year: Never true  Transportation Needs: No Transportation Needs (12/17/2023)   PRAPARE - Administrator, Civil Service (Medical): No    Lack of Transportation (Non-Medical): No  Physical Activity: Not on file  Stress: Not on file  Social Connections: Moderately Isolated (12/17/2023)   Social Connection and Isolation Panel    Frequency of Communication with Friends and Family: More than three times a week    Frequency of Social Gatherings with Friends and Family: Twice a week    Attends Religious  Services: Never    Database administrator or Organizations: No    Attends Banker Meetings: Never    Marital Status: Married  Catering manager Violence: Not At Risk (12/17/2023)   Humiliation, Afraid, Rape, and Kick questionnaire    Fear of Current or Ex-Partner: No    Emotionally Abused: No    Physically Abused: No    Sexually Abused: No   Family History  Problem Relation Age of Onset   Diabetes Mother    Diabetes Son    Pancreatic cancer Other        GRANDFATHER   Colon cancer Other        INTESTINAL, GRANDMOTHER      VITAL SIGNS BP 130/77   Pulse 85   Temp (!) 97 F (36.1 C)   Resp 16   Ht 6' (1.829 m)   Wt 177 lb 12.8 oz (80.6 kg)   SpO2 99%   BMI 24.11 kg/m   Outpatient Encounter Medications as of 01/14/2024  Medication Sig   apixaban  (ELIQUIS ) 5 MG TABS tablet Take 1 tablet (5 mg total) by mouth 2 (two) times daily.   Choline Fenofibrate  (FENOFIBRIC ACID ) 135 MG CPDR Take 135 mg by mouth daily.   collagenase (SANTYL) 250 UNIT/GM ointment Apply 1 Application topically daily.   Continuous Glucose Sensor (FREESTYLE LIBRE 3 PLUS SENSOR) MISC Change sensor every 15 days.   cyanocobalamin  (VITAMIN B12) 1000 MCG tablet Take 1,000 mcg by mouth daily.   escitalopram  (LEXAPRO ) 10 MG tablet Take 1 tablet (10 mg total) by mouth daily.   folic acid  (FOLVITE ) 1 MG tablet Take 1 tablet (1 mg total) by mouth daily.   hydrocortisone  (ANUSOL -HC) 2.5 % rectal cream Apply 1 Application topically 4 (four) times daily as needed for hemorrhoids or anal itching.   insulin  glargine (LANTUS ) 100 UNIT/ML injection Inject 20 Units into the skin daily.   Insulin  lispro (HUMALOG  JUNIOR KWIKPEN) 100 UNIT/ML Inject 10 Units into the skin 3 (three) times daily. With meals   linagliptin  (TRADJENTA ) 5 MG TABS tablet Take 1 tablet (5 mg total) by mouth daily.   magnesium  oxide (MAG-OX) 400 (240 Mg) MG tablet Take 1 tablet (400 mg total) by mouth daily.   metoprolol  tartrate (LOPRESSOR )  25 MG tablet Take 0.5 tablets (12.5 mg total) by mouth 2 (two) times daily.   mirabegron  ER (MYRBETRIQ ) 50 MG TB24 tablet Take 1 tablet (50 mg total) by mouth daily.   rosuvastatin  (CRESTOR ) 20 MG tablet Take 20 mg by mouth daily.   [DISCONTINUED] ONETOUCH VERIO test strip SMARTSIG:1 Strip(s) Via Meter 5 Times Daily (Patient not taking: Reported on 01/03/2024)   No facility-administered encounter medications on  file as of 01/14/2024.     SIGNIFICANT DIAGNOSTIC EXAMS  Review of Systems  Constitutional:  Negative for malaise/fatigue.  Respiratory:  Negative for cough and shortness of breath.   Cardiovascular:  Negative for chest pain, palpitations and leg swelling.  Gastrointestinal:  Negative for abdominal pain, constipation and heartburn.  Musculoskeletal:  Negative for back pain, joint pain and myalgias.  Skin: Negative.   Neurological:  Negative for dizziness.  Psychiatric/Behavioral:  The patient is not nervous/anxious.     Physical Exam Constitutional:      General: He is not in acute distress.    Appearance: He is well-developed. He is not diaphoretic.  Neck:     Thyroid : No thyromegaly.  Cardiovascular:     Rate and Rhythm: Normal rate and regular rhythm.     Heart sounds: Normal heart sounds.  Pulmonary:     Effort: Pulmonary effort is normal. No respiratory distress.     Breath sounds: Normal breath sounds.  Abdominal:     General: Bowel sounds are normal. There is no distension.     Palpations: Abdomen is soft.     Tenderness: There is no abdominal tenderness.  Musculoskeletal:        General: Normal range of motion.     Cervical back: Neck supple.     Right lower leg: No edema.     Left lower leg: No edema.  Lymphadenopathy:     Cervical: No cervical adenopathy.  Skin:    General: Skin is warm and dry.  Neurological:     Mental Status: He is alert. Mental status is at baseline.  Psychiatric:        Mood and Affect: Mood normal.      ASSESSMENT/  PLAN:   Patient is being discharged with the following home health services:  pt/ot to evaluate and treat as indicated for gait balance strength adl training   Patient is being discharged with the following durable medical equipment:  none needed   Patient has been advised to f/u with their PCP in 1-2 weeks to for a transitions of care visit.  Social services at their facility was responsible for arranging this appointment.  Pt was provided with adequate prescriptions of noncontrolled medications to reach the scheduled appointment .  For controlled substances, a limited supply was provided as appropriate for the individual patient.  If the pt normally receives these medications from a pain clinic or has a contract with another physician, these medications should be received from that clinic or physician only).    A 30 day supply of his prescription medications have been sent to [facility] [place]  Time spent with patient: 40 minutes: medications; home health; dme.    Barnie Seip NP [facility] Adult Medicine   call [phone]

## 2024-01-15 DIAGNOSIS — G459 Transient cerebral ischemic attack, unspecified: Secondary | ICD-10-CM | POA: Diagnosis not present

## 2024-01-15 DIAGNOSIS — I951 Orthostatic hypotension: Secondary | ICD-10-CM | POA: Diagnosis not present

## 2024-01-15 DIAGNOSIS — R4701 Aphasia: Secondary | ICD-10-CM | POA: Diagnosis not present

## 2024-01-15 DIAGNOSIS — A770 Spotted fever due to Rickettsia rickettsii: Secondary | ICD-10-CM | POA: Diagnosis not present

## 2024-01-15 DIAGNOSIS — M6282 Rhabdomyolysis: Secondary | ICD-10-CM | POA: Diagnosis not present

## 2024-01-15 DIAGNOSIS — E1159 Type 2 diabetes mellitus with other circulatory complications: Secondary | ICD-10-CM | POA: Diagnosis not present

## 2024-01-15 DIAGNOSIS — E669 Obesity, unspecified: Secondary | ICD-10-CM | POA: Diagnosis not present

## 2024-01-15 DIAGNOSIS — N390 Urinary tract infection, site not specified: Secondary | ICD-10-CM | POA: Diagnosis not present

## 2024-01-15 DIAGNOSIS — F339 Major depressive disorder, recurrent, unspecified: Secondary | ICD-10-CM | POA: Diagnosis not present

## 2024-01-16 DIAGNOSIS — E1159 Type 2 diabetes mellitus with other circulatory complications: Secondary | ICD-10-CM | POA: Diagnosis not present

## 2024-01-16 DIAGNOSIS — M6282 Rhabdomyolysis: Secondary | ICD-10-CM | POA: Diagnosis not present

## 2024-01-16 DIAGNOSIS — I951 Orthostatic hypotension: Secondary | ICD-10-CM | POA: Diagnosis not present

## 2024-01-16 DIAGNOSIS — G459 Transient cerebral ischemic attack, unspecified: Secondary | ICD-10-CM | POA: Diagnosis not present

## 2024-01-16 DIAGNOSIS — A770 Spotted fever due to Rickettsia rickettsii: Secondary | ICD-10-CM | POA: Diagnosis not present

## 2024-01-16 DIAGNOSIS — F339 Major depressive disorder, recurrent, unspecified: Secondary | ICD-10-CM | POA: Diagnosis not present

## 2024-01-16 DIAGNOSIS — R4701 Aphasia: Secondary | ICD-10-CM | POA: Diagnosis not present

## 2024-01-16 DIAGNOSIS — E669 Obesity, unspecified: Secondary | ICD-10-CM | POA: Diagnosis not present

## 2024-01-17 DIAGNOSIS — E669 Obesity, unspecified: Secondary | ICD-10-CM | POA: Diagnosis not present

## 2024-01-17 DIAGNOSIS — F339 Major depressive disorder, recurrent, unspecified: Secondary | ICD-10-CM | POA: Diagnosis not present

## 2024-01-17 DIAGNOSIS — E1159 Type 2 diabetes mellitus with other circulatory complications: Secondary | ICD-10-CM | POA: Diagnosis not present

## 2024-01-17 DIAGNOSIS — G459 Transient cerebral ischemic attack, unspecified: Secondary | ICD-10-CM | POA: Diagnosis not present

## 2024-01-17 DIAGNOSIS — A770 Spotted fever due to Rickettsia rickettsii: Secondary | ICD-10-CM | POA: Diagnosis not present

## 2024-01-17 DIAGNOSIS — M6282 Rhabdomyolysis: Secondary | ICD-10-CM | POA: Diagnosis not present

## 2024-01-17 DIAGNOSIS — R4701 Aphasia: Secondary | ICD-10-CM | POA: Diagnosis not present

## 2024-01-17 DIAGNOSIS — I951 Orthostatic hypotension: Secondary | ICD-10-CM | POA: Diagnosis not present

## 2024-01-20 DIAGNOSIS — R4701 Aphasia: Secondary | ICD-10-CM | POA: Diagnosis not present

## 2024-01-20 DIAGNOSIS — E1159 Type 2 diabetes mellitus with other circulatory complications: Secondary | ICD-10-CM | POA: Diagnosis not present

## 2024-01-20 DIAGNOSIS — M6282 Rhabdomyolysis: Secondary | ICD-10-CM | POA: Diagnosis not present

## 2024-01-20 DIAGNOSIS — N390 Urinary tract infection, site not specified: Secondary | ICD-10-CM | POA: Diagnosis not present

## 2024-01-20 DIAGNOSIS — A770 Spotted fever due to Rickettsia rickettsii: Secondary | ICD-10-CM | POA: Diagnosis not present

## 2024-01-20 DIAGNOSIS — F339 Major depressive disorder, recurrent, unspecified: Secondary | ICD-10-CM | POA: Diagnosis not present

## 2024-01-20 DIAGNOSIS — E669 Obesity, unspecified: Secondary | ICD-10-CM | POA: Diagnosis not present

## 2024-01-20 DIAGNOSIS — I951 Orthostatic hypotension: Secondary | ICD-10-CM | POA: Diagnosis not present

## 2024-01-20 DIAGNOSIS — G459 Transient cerebral ischemic attack, unspecified: Secondary | ICD-10-CM | POA: Diagnosis not present

## 2024-01-21 DIAGNOSIS — G459 Transient cerebral ischemic attack, unspecified: Secondary | ICD-10-CM | POA: Diagnosis not present

## 2024-01-21 DIAGNOSIS — M6282 Rhabdomyolysis: Secondary | ICD-10-CM | POA: Diagnosis not present

## 2024-01-21 DIAGNOSIS — I951 Orthostatic hypotension: Secondary | ICD-10-CM | POA: Diagnosis not present

## 2024-01-21 DIAGNOSIS — E1159 Type 2 diabetes mellitus with other circulatory complications: Secondary | ICD-10-CM | POA: Diagnosis not present

## 2024-01-21 DIAGNOSIS — A770 Spotted fever due to Rickettsia rickettsii: Secondary | ICD-10-CM | POA: Diagnosis not present

## 2024-01-21 DIAGNOSIS — F339 Major depressive disorder, recurrent, unspecified: Secondary | ICD-10-CM | POA: Diagnosis not present

## 2024-01-21 DIAGNOSIS — N390 Urinary tract infection, site not specified: Secondary | ICD-10-CM | POA: Diagnosis not present

## 2024-01-21 DIAGNOSIS — E669 Obesity, unspecified: Secondary | ICD-10-CM | POA: Diagnosis not present

## 2024-01-21 DIAGNOSIS — R4701 Aphasia: Secondary | ICD-10-CM | POA: Diagnosis not present

## 2024-01-22 DIAGNOSIS — E1159 Type 2 diabetes mellitus with other circulatory complications: Secondary | ICD-10-CM | POA: Diagnosis not present

## 2024-01-22 DIAGNOSIS — R4701 Aphasia: Secondary | ICD-10-CM | POA: Diagnosis not present

## 2024-01-22 DIAGNOSIS — I951 Orthostatic hypotension: Secondary | ICD-10-CM | POA: Diagnosis not present

## 2024-01-22 DIAGNOSIS — N390 Urinary tract infection, site not specified: Secondary | ICD-10-CM | POA: Diagnosis not present

## 2024-01-22 DIAGNOSIS — A770 Spotted fever due to Rickettsia rickettsii: Secondary | ICD-10-CM | POA: Diagnosis not present

## 2024-01-22 DIAGNOSIS — F339 Major depressive disorder, recurrent, unspecified: Secondary | ICD-10-CM | POA: Diagnosis not present

## 2024-01-22 DIAGNOSIS — E669 Obesity, unspecified: Secondary | ICD-10-CM | POA: Diagnosis not present

## 2024-01-22 DIAGNOSIS — G459 Transient cerebral ischemic attack, unspecified: Secondary | ICD-10-CM | POA: Diagnosis not present

## 2024-01-22 DIAGNOSIS — M6282 Rhabdomyolysis: Secondary | ICD-10-CM | POA: Diagnosis not present

## 2024-01-27 ENCOUNTER — Encounter: Payer: Self-pay | Admitting: Adult Health

## 2024-01-27 NOTE — Progress Notes (Signed)
 Location:  [facility] Room Number: 128 Place of Service:  SNF (31)   CODE STATUS:   No Known Allergies  Chief Complaint  Patient presents with  . Discharge Note    HPI:    Past Medical History:  Diagnosis Date  . Atrial fibrillation (HCC)   . Cholelithiasis 10/2011   Per CT. No cholecystitis radiographically.  . 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 Depression   . Diabetes mellitus   . GERD (gastroesophageal reflux disease)   . Hemorrhoids 2008  . Hyperlipidemia   . Hypertension   . Obesity   . 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  . Shortness of breath    exertion  . Sleep apnea    cpap  . Stroke [facility]) 2010/2013   Right Brain stroke    Past Surgical History:  Procedure Laterality Date  . CARDIOVERSION  07/12/2011   Procedure: CARDIOVERSION;  Surgeon: Vinie KYM Maxcy, MD;  Location: [facility] ENDOSCOPY;  Service: Cardiovascular;  Laterality: N/A;  . CAROTID DOPPLER STUDY  01/31/2012   no significant extracranial carotid artery stenosis  . CATARACT EXTRACTION W/PHACO Left 01/30/2019   Procedure: CATARACT EXTRACTION PHACO AND INTRAOCULAR LENS PLACEMENT (IOC) (CDE: 4.76);  Surgeon: Harrie Agent, MD;  Location: [facility] ORS;  Service: Ophthalmology;  Laterality: Left;  . COLONOSCOPY    . DOPPLER ECHOCARDIOGRAPHY  01/31/2012   EF 55-60%, moderately calcified annulus of the mitral valve, left atrium demonstrated mild-moderately dilated  . KNEE SURGERY    . TEE WITHOUT CARDIOVERSION  07/12/2011   Procedure: TRANSESOPHAGEAL ECHOCARDIOGRAM (TEE);  Surgeon: Vinie KYM Maxcy, MD;  Location: [facility] ENDOSCOPY;  Service: Cardiovascular;  Laterality: N/A;  to be done at 1330  . TONSILLECTOMY      Social History   Socioeconomic History  . Marital status: Married    Spouse name: Naomie   . Number of children: 2  . Years of education: 12+  . Highest education level: Not on file  Occupational  History    Employer: RETIRED  Tobacco Use  . Smoking status: Never  . Smokeless tobacco: Never  Vaping Use  . Vaping status: Never Used  Substance and Sexual Activity  . Alcohol  use: Yes    Alcohol /week: 2.0 standard drinks of alcohol     Types: 2 Glasses of wine per week    Comment: occasionally   . Drug use: No  . Sexual activity: Never  Other Topics Concern  . Not on file  Social History Narrative   Patient lives at home wife Naomie. .    Patient has 2 children.    Patient is right handed.    Patient is retired.    Patient has some college.          Social Drivers of Corporate Investment Banker Strain: Not on file  Food Insecurity: No Food Insecurity (12/17/2023)   Hunger Vital Sign   . Worried About Programme Researcher, Broadcasting/film/video in the Last Year: Never true   . Ran Out of Food in the Last Year: Never true  Transportation Needs: No Transportation Needs (12/17/2023)   PRAPARE - Transportation   . Lack of Transportation (Medical): No   . Lack of Transportation (Non-Medical): No  Physical Activity: Not on file  Stress: Not on file  Social Connections: Moderately Isolated (12/17/2023)   Social Connection and Isolation Panel   . Frequency of Communication with Friends and Family: More than  three times a week   . Frequency of Social Gatherings with Friends and Family: Twice a week   . Attends Religious Services: Never   . Active Member of Clubs or Organizations: No   . Attends Banker Meetings: Never   . Marital Status: Married  Catering Manager Violence: Not At Risk (12/17/2023)   Humiliation, Afraid, Rape, and Kick questionnaire   . Fear of Current or Ex-Partner: No   . Emotionally Abused: No   . Physically Abused: No   . Sexually Abused: No   Family History  Problem Relation Age of Onset  . Diabetes Mother   . Diabetes Son   . Pancreatic cancer Other        GRANDFATHER  . Colon cancer Other        INTESTINAL, GRANDMOTHER      VITAL SIGNS BP 138/73    Pulse 97   Temp 98.2 F (36.8 C)   Resp 20   Ht 6' (1.829 m)   Wt 177 lb 12.8 oz (80.6 kg)   SpO2 96%   BMI 24.11 kg/m   Outpatient Encounter Medications as of 01/27/2024  Medication Sig  . apixaban  (ELIQUIS ) 5 MG TABS tablet Take 1 tablet (5 mg total) by mouth 2 (two) times daily.  . Choline Fenofibrate  (FENOFIBRIC ACID ) 135 MG CPDR Take 1 capsule (135 mg total) by mouth daily.  . collagenase (SANTYL) 250 UNIT/GM ointment Apply 1 Application topically daily.  . Continuous Glucose Sensor (FREESTYLE LIBRE 3 PLUS SENSOR) MISC Change sensor every 15 days.  . cyanocobalamin  (VITAMIN B12) 1000 MCG tablet Take 1,000 mcg by mouth daily.  . escitalopram  (LEXAPRO ) 10 MG tablet Take 1 tablet (10 mg total) by mouth daily.  . folic acid  (FOLVITE ) 1 MG tablet Take 1 tablet (1 mg total) by mouth daily.  . hydrocortisone  (ANUSOL -HC) 2.5 % rectal cream Apply 1 Application topically 4 (four) times daily as needed for hemorrhoids or anal itching.  . insulin  glargine (LANTUS ) 100 UNIT/ML injection Inject 0.2 mLs (20 Units total) into the skin daily.  . Insulin  lispro (HUMALOG  JUNIOR KWIKPEN) 100 UNIT/ML Inject 10 Units into the skin 3 (three) times daily. With meals  . linagliptin  (TRADJENTA ) 5 MG TABS tablet Take 1 tablet (5 mg total) by mouth daily.  . magnesium  oxide (MAG-OX) 400 (240 Mg) MG tablet Take 1 tablet (400 mg total) by mouth daily.  . metoprolol  tartrate (LOPRESSOR ) 25 MG tablet Take 0.5 tablets (12.5 mg total) by mouth 2 (two) times daily.  . mirabegron  ER (MYRBETRIQ ) 50 MG TB24 tablet Take 1 tablet (50 mg total) by mouth daily.  . rosuvastatin  (CRESTOR ) 20 MG tablet Take 1 tablet (20 mg total) by mouth daily.   No facility-administered encounter medications on file as of 01/27/2024.     SIGNIFICANT DIAGNOSTIC EXAMS       ASSESSMENT/ PLAN:     Barnie Seip NP [facility] Adult Medicine  Contact [phone] Monday through Friday 8am- 5pm  After hours call [phone]  This  encounter was created in error - please disregard.

## 2024-02-11 ENCOUNTER — Encounter: Payer: Self-pay | Admitting: Adult Health

## 2024-02-11 NOTE — Progress Notes (Unsigned)
 Location:  [facility] Room Number: South/128/P Place of Service:  SNF (31)   CODE STATUS: DNR  No Known Allergies  Chief Complaint  Patient presents with   Discharge Note    HPI:    Past Medical History:  Diagnosis Date   Atrial fibrillation (HCC)    Cholelithiasis 10/2011   Per CT. No cholecystitis radiographically.   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   Shortness of breath    exertion   Sleep apnea    cpap   Stroke [facility]) 2010/2013   Right Brain stroke    Past Surgical History:  Procedure Laterality Date   CARDIOVERSION  07/12/2011   Procedure: CARDIOVERSION;  Surgeon: Vinie KYM Maxcy, MD;  Location: [facility] ENDOSCOPY;  Service: Cardiovascular;  Laterality: N/A;   CAROTID DOPPLER STUDY  01/31/2012   no significant extracranial carotid artery stenosis   CATARACT EXTRACTION W/PHACO Left 01/30/2019   Procedure: CATARACT EXTRACTION PHACO AND INTRAOCULAR LENS PLACEMENT (IOC) (CDE: 4.76);  Surgeon: Harrie Agent, MD;  Location: [facility] ORS;  Service: Ophthalmology;  Laterality: Left;   COLONOSCOPY     DOPPLER ECHOCARDIOGRAPHY  01/31/2012   EF 55-60%, moderately calcified annulus of the mitral valve, left atrium demonstrated mild-moderately dilated   KNEE SURGERY     TEE WITHOUT CARDIOVERSION  07/12/2011   Procedure: TRANSESOPHAGEAL ECHOCARDIOGRAM (TEE);  Surgeon: Vinie KYM Maxcy, MD;  Location: [facility] ENDOSCOPY;  Service: Cardiovascular;  Laterality: N/A;  to be done at 1330   TONSILLECTOMY      Social History   Socioeconomic History   Marital status: Married    Spouse name: Dorothy    Number of children: 2   Years of education: 12+   Highest education level: Not on file  Occupational History     Employer: RETIRED  Tobacco Use   Smoking status: Never   Smokeless tobacco: Never  Vaping Use   Vaping status: Never Used  Substance and Sexual Activity   Alcohol  use: Yes    Alcohol /week: 2.0 standard drinks of alcohol     Types: 2 Glasses of wine per week    Comment: occasionally    Drug use: No   Sexual activity: Never  Other Topics Concern   Not on file  Social History Narrative   Patient lives at home wife Naomie. .    Patient has 2 children.    Patient is right handed.    Patient is retired.    Patient has some college.          Social Drivers of Corporate Investment Banker Strain: Not on file  Food Insecurity: No Food Insecurity (12/17/2023)   Hunger Vital Sign    Worried About Running Out of Food in the Last Year: Never true    Ran Out of Food in the Last Year: Never true  Transportation Needs: No Transportation Needs (12/17/2023)   PRAPARE - Administrator, Civil Service (Medical): No    Lack of Transportation (Non-Medical): No  Physical Activity: Not on file  Stress: Not on file  Social Connections: Moderately Isolated (12/17/2023)   Social Connection and Isolation Panel    Frequency of Communication with Friends and Family: More than  three times a week    Frequency of Social Gatherings with Friends and Family: Twice a week    Attends Religious Services: Never    Database Administrator or Organizations: No    Attends Banker Meetings: Never    Marital Status: Married  Catering Manager Violence: Not At Risk (12/17/2023)   Humiliation, Afraid, Rape, and Kick questionnaire    Fear of Current or Ex-Partner: No    Emotionally Abused: No    Physically Abused: No    Sexually Abused: No   Family History  Problem Relation Age of Onset   Diabetes Mother    Diabetes Son    Pancreatic cancer Other        GRANDFATHER   Colon cancer Other        INTESTINAL, GRANDMOTHER      VITAL SIGNS BP (!) 142/77   Pulse 91   Temp 97.6 F (36.4 C)    Resp 20   Ht 6' (1.829 m)   Wt 177 lb 12.8 oz (80.6 kg)   SpO2 95%   BMI 24.11 kg/m   Outpatient Encounter Medications as of 02/11/2024  Medication Sig   apixaban  (ELIQUIS ) 5 MG TABS tablet Take 1 tablet (5 mg total) by mouth 2 (two) times daily.   Choline Fenofibrate  (FENOFIBRIC ACID ) 135 MG CPDR Take 1 capsule (135 mg total) by mouth daily.   cyanocobalamin  (VITAMIN B12) 1000 MCG tablet Take 1,000 mcg by mouth daily.   EMBECTA AUTOSHIELD DUO 30G X 5 MM MISC    escitalopram  (LEXAPRO ) 10 MG tablet Take 1 tablet (10 mg total) by mouth daily.   folic acid  (FOLVITE ) 1 MG tablet Take 1 tablet (1 mg total) by mouth daily.   hydrocortisone  (ANUSOL -HC) 2.5 % rectal cream Apply 1 Application topically 4 (four) times daily as needed for hemorrhoids or anal itching.   insulin  glargine (LANTUS ) 100 UNIT/ML injection Inject 0.2 mLs (20 Units total) into the skin daily.   linagliptin  (TRADJENTA ) 5 MG TABS tablet Take 1 tablet (5 mg total) by mouth daily.   magnesium  oxide (MAG-OX) 400 (240 Mg) MG tablet Take 1 tablet (400 mg total) by mouth daily.   metoprolol  tartrate (LOPRESSOR ) 25 MG tablet Take 0.5 tablets (12.5 mg total) by mouth 2 (two) times daily.   mirabegron  ER (MYRBETRIQ ) 50 MG TB24 tablet Take 1 tablet (50 mg total) by mouth daily.   rosuvastatin  (CRESTOR ) 20 MG tablet Take 1 tablet (20 mg total) by mouth daily.   collagenase (SANTYL) 250 UNIT/GM ointment Apply 1 Application topically daily. (Patient not taking: Reported on 02/11/2024)   Continuous Glucose Sensor (FREESTYLE LIBRE 3 PLUS SENSOR) MISC Change sensor every 15 days. (Patient not taking: Reported on 02/11/2024)   Insulin  lispro (HUMALOG  JUNIOR KWIKPEN) 100 UNIT/ML Inject 10 Units into the skin 3 (three) times daily. With meals (Patient not taking: Reported on 02/11/2024)   No facility-administered encounter medications on file as of 02/11/2024.     SIGNIFICANT DIAGNOSTIC EXAMS       ASSESSMENT/ PLAN:     Barnie Seip NP [facility]  Contact [phone] Monday through Friday 8am- 5pm  After hours call [phone]

## 2024-02-12 ENCOUNTER — Other Ambulatory Visit: Payer: Self-pay | Admitting: Adult Health

## 2024-02-12 NOTE — Progress Notes (Signed)
 This encounter was created in error - please disregard.

## 2024-02-17 DIAGNOSIS — Z0000 Encounter for general adult medical examination without abnormal findings: Secondary | ICD-10-CM | POA: Diagnosis not present

## 2024-02-17 DIAGNOSIS — Z794 Long term (current) use of insulin: Secondary | ICD-10-CM | POA: Diagnosis not present

## 2024-02-17 DIAGNOSIS — M6281 Muscle weakness (generalized): Secondary | ICD-10-CM | POA: Diagnosis not present

## 2024-02-17 DIAGNOSIS — D519 Vitamin B12 deficiency anemia, unspecified: Secondary | ICD-10-CM | POA: Diagnosis not present

## 2024-02-17 DIAGNOSIS — E1152 Type 2 diabetes mellitus with diabetic peripheral angiopathy with gangrene: Secondary | ICD-10-CM | POA: Diagnosis not present

## 2024-02-17 DIAGNOSIS — K219 Gastro-esophageal reflux disease without esophagitis: Secondary | ICD-10-CM | POA: Diagnosis not present

## 2024-02-17 DIAGNOSIS — I482 Chronic atrial fibrillation, unspecified: Secondary | ICD-10-CM | POA: Diagnosis not present

## 2024-02-17 DIAGNOSIS — I739 Peripheral vascular disease, unspecified: Secondary | ICD-10-CM | POA: Diagnosis not present

## 2024-02-17 DIAGNOSIS — Z7901 Long term (current) use of anticoagulants: Secondary | ICD-10-CM | POA: Diagnosis not present

## 2024-02-19 ENCOUNTER — Observation Stay (HOSPITAL_COMMUNITY)

## 2024-02-19 ENCOUNTER — Other Ambulatory Visit: Payer: Self-pay

## 2024-02-19 ENCOUNTER — Emergency Department (HOSPITAL_COMMUNITY)

## 2024-02-19 ENCOUNTER — Observation Stay (HOSPITAL_COMMUNITY)
Admission: EM | Admit: 2024-02-19 | Discharge: 2024-02-20 | Disposition: A | Discharge status: Home / Self Care | Source: Skilled Nursing Facility | Attending: Emergency Medicine | Admitting: Emergency Medicine

## 2024-02-19 ENCOUNTER — Encounter (HOSPITAL_COMMUNITY): Payer: Self-pay

## 2024-02-19 DIAGNOSIS — I482 Chronic atrial fibrillation, unspecified: Secondary | ICD-10-CM | POA: Diagnosis not present

## 2024-02-19 DIAGNOSIS — I4811 Longstanding persistent atrial fibrillation: Secondary | ICD-10-CM | POA: Insufficient documentation

## 2024-02-19 DIAGNOSIS — I10 Essential (primary) hypertension: Secondary | ICD-10-CM | POA: Diagnosis not present

## 2024-02-19 DIAGNOSIS — E1165 Type 2 diabetes mellitus with hyperglycemia: Secondary | ICD-10-CM | POA: Diagnosis not present

## 2024-02-19 DIAGNOSIS — I959 Hypotension, unspecified: Secondary | ICD-10-CM | POA: Diagnosis not present

## 2024-02-19 DIAGNOSIS — R55 Syncope and collapse: Secondary | ICD-10-CM | POA: Diagnosis not present

## 2024-02-19 DIAGNOSIS — I350 Nonrheumatic aortic (valve) stenosis: Secondary | ICD-10-CM | POA: Insufficient documentation

## 2024-02-19 DIAGNOSIS — Z7901 Long term (current) use of anticoagulants: Secondary | ICD-10-CM | POA: Diagnosis not present

## 2024-02-19 DIAGNOSIS — F1090 Alcohol use, unspecified, uncomplicated: Secondary | ICD-10-CM | POA: Insufficient documentation

## 2024-02-19 DIAGNOSIS — I4819 Other persistent atrial fibrillation: Secondary | ICD-10-CM | POA: Diagnosis present

## 2024-02-19 DIAGNOSIS — E785 Hyperlipidemia, unspecified: Secondary | ICD-10-CM | POA: Diagnosis present

## 2024-02-19 DIAGNOSIS — Z79899 Other long term (current) drug therapy: Secondary | ICD-10-CM | POA: Insufficient documentation

## 2024-02-19 DIAGNOSIS — G4733 Obstructive sleep apnea (adult) (pediatric): Secondary | ICD-10-CM | POA: Diagnosis not present

## 2024-02-19 DIAGNOSIS — E119 Type 2 diabetes mellitus without complications: Secondary | ICD-10-CM

## 2024-02-19 DIAGNOSIS — R0902 Hypoxemia: Secondary | ICD-10-CM | POA: Diagnosis not present

## 2024-02-19 LAB — COMPREHENSIVE METABOLIC PANEL WITH GFR
ALT: 12 U/L (ref 0–44)
AST: 23 U/L (ref 15–41)
Albumin: 3.2 g/dL — ABNORMAL LOW (ref 3.5–5.0)
Alkaline Phosphatase: 71 U/L (ref 38–126)
Anion gap: 9 (ref 5–15)
BUN: 17 mg/dL (ref 8–23)
CO2: 26 mmol/L (ref 22–32)
Calcium: 8.5 mg/dL — ABNORMAL LOW (ref 8.9–10.3)
Chloride: 105 mmol/L (ref 98–111)
Creatinine, Ser: 0.99 mg/dL (ref 0.61–1.24)
GFR, Estimated: >60 mL/min (ref >60)
Glucose, Bld: 150 mg/dL — ABNORMAL HIGH (ref 70–99)
Potassium: 3.8 mmol/L (ref 3.5–5.1)
Sodium: 140 mmol/L (ref 135–145)
Total Bilirubin: 1.1 mg/dL (ref 0.0–1.2)
Total Protein: 5.7 g/dL — ABNORMAL LOW (ref 6.5–8.1)

## 2024-02-19 LAB — CBC WITH DIFFERENTIAL/PLATELET
Abs Immature Granulocytes: 0.01 K/uL (ref 0.00–0.07)
Basophils Absolute: 0.1 K/uL (ref 0.0–0.1)
Basophils Relative: 1 %
Eosinophils Absolute: 0.1 K/uL (ref 0.0–0.5)
Eosinophils Relative: 2 %
HCT: 41.3 % (ref 39.0–52.0)
Hemoglobin: 13.3 g/dL (ref 13.0–17.0)
Immature Granulocytes: 0 %
Lymphocytes Relative: 20 %
Lymphs Abs: 1.2 K/uL (ref 0.7–4.0)
MCH: 31.1 pg (ref 26.0–34.0)
MCHC: 32.2 g/dL (ref 30.0–36.0)
MCV: 96.5 fL (ref 80.0–100.0)
Monocytes Absolute: 0.7 K/uL (ref 0.1–1.0)
Monocytes Relative: 11 %
Neutro Abs: 4.1 K/uL (ref 1.7–7.7)
Neutrophils Relative %: 66 %
Platelets: 160 K/uL (ref 150–400)
RBC: 4.28 MIL/uL (ref 4.22–5.81)
RDW: 13.3 % (ref 11.5–15.5)
WBC: 6.2 K/uL (ref 4.0–10.5)
nRBC: 0 % (ref 0.0–0.2)

## 2024-02-19 LAB — URINALYSIS, ROUTINE W REFLEX MICROSCOPIC
Bilirubin Urine: NEGATIVE
Glucose, UA: NEGATIVE mg/dL
Hgb urine dipstick: NEGATIVE
Ketones, ur: NEGATIVE mg/dL
Nitrite: NEGATIVE
Protein, ur: 30 mg/dL — AB
Specific Gravity, Urine: 1.014 (ref 1.005–1.030)
pH: 7 (ref 5.0–8.0)

## 2024-02-19 LAB — TROPONIN T, HIGH SENSITIVITY
Troponin T High Sensitivity: 35 ng/L — ABNORMAL HIGH (ref 0–19)
Troponin T High Sensitivity: 33 ng/L — ABNORMAL HIGH (ref 0–19)

## 2024-02-19 LAB — CBG MONITORING, ED: Glucose-Capillary: 159 mg/dL — ABNORMAL HIGH (ref 70–99)

## 2024-02-19 LAB — D-DIMER, QUANTITATIVE: D-Dimer, Quant: 1.51 ug{FEU}/mL — ABNORMAL HIGH (ref 0.00–0.50)

## 2024-02-19 LAB — GLUCOSE, CAPILLARY
Glucose-Capillary: 138 mg/dL — ABNORMAL HIGH (ref 70–99)
Glucose-Capillary: 144 mg/dL — ABNORMAL HIGH (ref 70–99)

## 2024-02-19 LAB — MAGNESIUM: Magnesium: 2 mg/dL (ref 1.7–2.4)

## 2024-02-19 MED ORDER — ROSUVASTATIN CALCIUM 20 MG PO TABS
20.0000 mg | ORAL_TABLET | Freq: Every day | ORAL | Status: DC
  Administered 2024-02-19 – 2024-02-20 (×2): 20 mg via ORAL
  Filled 2024-02-19 (×2): qty 1

## 2024-02-19 MED ORDER — FOLIC ACID 1 MG PO TABS
1.0000 mg | ORAL_TABLET | Freq: Every day | ORAL | Status: DC
  Administered 2024-02-20: 1 mg via ORAL
  Filled 2024-02-19: qty 1

## 2024-02-19 MED ORDER — ESCITALOPRAM OXALATE 10 MG PO TABS
10.0000 mg | ORAL_TABLET | Freq: Every day | ORAL | Status: DC
  Administered 2024-02-20: 10 mg via ORAL
  Filled 2024-02-19: qty 1

## 2024-02-19 MED ORDER — APIXABAN 5 MG PO TABS
5.0000 mg | ORAL_TABLET | Freq: Two times a day (BID) | ORAL | Status: DC
  Administered 2024-02-19 – 2024-02-20 (×2): 5 mg via ORAL
  Filled 2024-02-19 (×2): qty 1

## 2024-02-19 MED ORDER — ENSURE PLUS HIGH PROTEIN PO LIQD
237.0000 mL | Freq: Two times a day (BID) | ORAL | Status: DC
  Administered 2024-02-20: 237 mL via ORAL

## 2024-02-19 MED ORDER — ONDANSETRON HCL 4 MG PO TABS: 4.0000 mg | ORAL_TABLET | Freq: Four times a day (QID) | ORAL | Status: DC | PRN

## 2024-02-19 MED ORDER — SODIUM CHLORIDE 0.9 % IV BOLUS
500.0000 mL | Freq: Once | INTRAVENOUS | Status: AC
  Administered 2024-02-19: 500 mL via INTRAVENOUS

## 2024-02-19 MED ORDER — MELATONIN 3 MG PO TABS
4.5000 mg | ORAL_TABLET | Freq: Every evening | ORAL | Status: DC | PRN
  Administered 2024-02-19: 4.5 mg via ORAL
  Filled 2024-02-19: qty 2

## 2024-02-19 MED ORDER — HYDROXYZINE HCL 10 MG PO TABS
10.0000 mg | ORAL_TABLET | Freq: Every day | ORAL | Status: DC
  Administered 2024-02-19: 10 mg via ORAL
  Filled 2024-02-19: qty 1

## 2024-02-19 MED ORDER — LINAGLIPTIN 5 MG PO TABS
5.0000 mg | ORAL_TABLET | Freq: Every day | ORAL | Status: DC
  Administered 2024-02-19 – 2024-02-20 (×2): 5 mg via ORAL
  Filled 2024-02-19 (×2): qty 1

## 2024-02-19 MED ORDER — INSULIN ASPART 100 UNIT/ML IJ SOLN
0.0000 [IU] | Freq: Three times a day (TID) | INTRAMUSCULAR | Status: DC
  Administered 2024-02-19: 1 [IU] via SUBCUTANEOUS
  Administered 2024-02-20: 2 [IU] via SUBCUTANEOUS
  Administered 2024-02-20: 1 [IU] via SUBCUTANEOUS
  Filled 2024-02-19 (×3): qty 1

## 2024-02-19 MED ORDER — METOPROLOL TARTRATE 25 MG PO TABS
12.5000 mg | ORAL_TABLET | Freq: Two times a day (BID) | ORAL | Status: DC
  Administered 2024-02-19 – 2024-02-20 (×2): 12.5 mg via ORAL
  Filled 2024-02-19 (×2): qty 1

## 2024-02-19 MED ORDER — FENOFIBRIC ACID 135 MG PO CPDR: 135.0000 mg | DELAYED_RELEASE_CAPSULE | Freq: Every day | ORAL | Status: DC

## 2024-02-19 MED ORDER — FENOFIBRATE 160 MG PO TABS
160.0000 mg | ORAL_TABLET | Freq: Every day | ORAL | Status: DC
  Administered 2024-02-20: 160 mg via ORAL
  Filled 2024-02-19: qty 1

## 2024-02-19 MED ORDER — VITAMIN B-12 1000 MCG PO TABS
1000.0000 ug | ORAL_TABLET | Freq: Every day | ORAL | Status: DC
  Administered 2024-02-20: 1000 ug via ORAL
  Filled 2024-02-19: qty 1

## 2024-02-19 MED ORDER — MIRABEGRON ER 25 MG PO TB24
50.0000 mg | ORAL_TABLET | Freq: Every day | ORAL | Status: DC
  Administered 2024-02-20: 50 mg via ORAL
  Filled 2024-02-19: qty 2

## 2024-02-19 MED ORDER — INSULIN ASPART 100 UNIT/ML IJ SOLN: 0.0000 [IU] | Freq: Every day | INTRAMUSCULAR | Status: DC

## 2024-02-19 MED ORDER — ONDANSETRON HCL 4 MG/2ML IJ SOLN: 4.0000 mg | Freq: Four times a day (QID) | INTRAMUSCULAR | Status: DC | PRN

## 2024-02-19 MED ORDER — ACETAMINOPHEN 650 MG RE SUPP: 650.0000 mg | Freq: Four times a day (QID) | RECTAL | Status: DC | PRN

## 2024-02-19 MED ORDER — ACETAMINOPHEN 325 MG PO TABS: 650.0000 mg | ORAL_TABLET | Freq: Four times a day (QID) | ORAL | Status: DC | PRN

## 2024-02-19 NOTE — ED Provider Notes (Signed)
 [place] EMERGENCY DEPARTMENT AT [facility] Provider Note   CSN: [id number] Arrival date & time: 02/19/24  9146     Patient presents with: Loss of Consciousness   [name] is a 83 y.o. male with a history including CVA, hypertension, type 2 diabetes, chronic A-fib, hyperlipidemia and GERD presenting with a syncopal event which occurred just prior to arrival.  [facility] where he was placed for rehab after an admission in September secondary to acute kidney injury.  Moved back to assisted living last week.  He  was sitting in a chair this morning and had a witnessed syncopal event which lasted approx 1 minute.  He denies any symptoms prior including palpitations, nausea vomiting, dizziness.  He denies any new focal weakness but does endorse generalized weakness.  He has not had breakfast yet this morning, stating he is missing breakfast being here.  He has not had any of his morning medicines yet.  States he felt well yesterday.  States he has passed out in the past with no diagnosis given.   The history is provided by the patient.       Prior to Admission medications   Medication Sig Start Date End Date Taking? Authorizing Provider  apixaban  (ELIQUIS ) 5 MG TABS tablet Take 1 tablet (5 mg total) by mouth 2 (two) times daily. 01/14/24  Yes Landy Barnie RAMAN, NP  Choline Fenofibrate  (FENOFIBRIC ACID ) 135 MG CPDR Take 1 capsule (135 mg total) by mouth daily. 01/14/24  Yes Landy Barnie RAMAN, NP  cyanocobalamin  (VITAMIN B12) 1000 MCG tablet Take 1,000 mcg by mouth daily.   Yes [provider]  escitalopram  (LEXAPRO ) 10 MG tablet Take 1 tablet (10 mg total) by mouth daily. 01/14/24  Yes Landy Barnie RAMAN, NP  folic acid  (FOLVITE ) 1 MG tablet Take 1 tablet (1 mg total) by mouth daily. 01/14/24  Yes Landy Barnie RAMAN, NP  hydrocortisone  (ANUSOL -HC) 2.5 % rectal cream Apply 1 Application topically 4 (four) times daily as needed for hemorrhoids  or anal itching. 11/30/23  Yes Johnson, Clanford L, MD  hydrOXYzine (ATARAX) 10 MG tablet Take 10 mg by mouth at bedtime. 02/18/24  Yes [provider]  LANTUS  SOLOSTAR 100 UNIT/ML Solostar Pen Inject 20 Units into the skin in the morning. 02/12/24  Yes [provider]  magnesium  oxide (MAG-OX) 400 (240 Mg) MG tablet Take 1 tablet (400 mg total) by mouth daily. 01/14/24  Yes Landy Barnie RAMAN, NP  MELATONIN MAXIMUM STRENGTH 5 MG TABS Take 5 mg by mouth at bedtime as needed. 02/18/24  Yes [provider]  metoprolol  tartrate (LOPRESSOR ) 25 MG tablet Take 0.5 tablets (12.5 mg total) by mouth 2 (two) times daily. 01/14/24  Yes Landy Barnie RAMAN, NP  mirabegron  ER (MYRBETRIQ ) 50 MG TB24 tablet Take 1 tablet (50 mg total) by mouth daily. 01/14/24  Yes Landy Barnie RAMAN, NP  rosuvastatin  (CRESTOR ) 20 MG tablet Take 1 tablet (20 mg total) by mouth daily. 01/14/24  Yes Landy Barnie RAMAN, NP  EMBECTA AUTOSHIELD DUO 30G X 5 MM MISC  01/28/24   [provider]  linagliptin  (TRADJENTA ) 5 MG TABS tablet Take 1 tablet (5 mg total) by mouth daily. 01/14/24   Landy Barnie RAMAN, NP    Allergies: Patient has no known allergies.    Review of Systems  Constitutional:  Negative for chills and fever.  HENT: Negative.    Eyes: Negative.   Respiratory:  Negative for chest  tightness and shortness of breath.   Cardiovascular:  Negative for chest pain, palpitations and leg swelling.  Gastrointestinal:  Negative for abdominal pain, nausea and vomiting.  Genitourinary: Negative.   Musculoskeletal:  Negative for arthralgias, joint swelling and neck pain.  Skin: Negative.  Negative for rash and wound.  Neurological:  Positive for syncope and weakness. Negative for dizziness, facial asymmetry, light-headedness, numbness and headaches.  Psychiatric/Behavioral: Negative.      Updated Vital Signs BP 133/86   Pulse 77   Temp 98.7 F (37.1 C) (Oral)   Resp 15   Ht 6' (1.829 m)   Wt 80.6  kg   SpO2 94%   BMI 24.10 kg/m   Physical Exam Vitals and nursing note reviewed.  Constitutional:      Appearance: He is well-developed.  HENT:     Head: Normocephalic and atraumatic.     Mouth/Throat:     Mouth: Mucous membranes are dry.  Eyes:     Conjunctiva/sclera: Conjunctivae normal.  Cardiovascular:     Rate and Rhythm: Normal rate. Rhythm irregular.     Heart sounds: Normal heart sounds.  Pulmonary:     Effort: Pulmonary effort is normal.     Breath sounds: Normal breath sounds. No wheezing.  Abdominal:     General: Bowel sounds are normal.     Palpations: Abdomen is soft.     Tenderness: There is no abdominal tenderness. There is no guarding.  Musculoskeletal:        General: Normal range of motion.     Cervical back: Normal range of motion.  Skin:    General: Skin is warm and dry.  Neurological:     General: No focal deficit present.     Mental Status: He is alert and oriented to person, place, and time.     Cranial Nerves: No cranial nerve deficit.     Sensory: No sensory deficit.     Comments: Equal grip strength. Moves all extremities without deficit.     (all labs ordered are listed, but only abnormal results are displayed) Labs Reviewed  COMPREHENSIVE METABOLIC PANEL WITH GFR - Abnormal; Notable for the following components:      Result Value   Glucose, Bld 150 (*)    Calcium  8.5 (*)    Total Protein 5.7 (*)    Albumin 3.2 (*)    All other components within normal limits  URINALYSIS, ROUTINE W REFLEX MICROSCOPIC - Abnormal; Notable for the following components:   Color, Urine AMBER (*)    APPearance HAZY (*)    Protein, ur 30 (*)    Leukocytes,Ua TRACE (*)    Bacteria, UA RARE (*)    All other components within normal limits  CBG MONITORING, ED - Abnormal; Notable for the following components:   Glucose-Capillary 159 (*)    All other components within normal limits  TROPONIN T, HIGH SENSITIVITY - Abnormal; Notable for the following components:    Troponin T High Sensitivity 35 (*)    All other components within normal limits  TROPONIN T, HIGH SENSITIVITY - Abnormal; Notable for the following components:   Troponin T High Sensitivity 33 (*)    All other components within normal limits  CBC WITH DIFFERENTIAL/PLATELET    EKG: EKG Interpretation Date/Time:  Wednesday February 19 2024 09:20:44 EST Ventricular Rate:  73 PR Interval:    QRS Duration:  106 QT Interval:  432 QTC Calculation: 477 R Axis:   38  Text Interpretation: Atrial fibrillation Anterior infarct,  old Confirmed by Cottie Cough [phone]) on 02/19/2024 9:38:52 AM  Radiology: [facility] Chest Port 1 View Result Date: 02/19/2024 CLINICAL DATA:  Syncope. EXAM: PORTABLE CHEST 1 VIEW COMPARISON:  12/17/2023 FINDINGS: Lungs are adequately inflated without focal airspace consolidation or effusion. Cardiomediastinal silhouette is normal. Degenerative changes over the spine and shoulders. IMPRESSION: No active disease. Electronically Signed   By: Toribio Agreste M.D.   On: 02/19/2024 09:57     Procedures   Medications Ordered in the ED  sodium chloride  0.9 % bolus 500 mL (0 mLs Intravenous Stopped 02/19/24 1138)    Clinical Course as of 02/19/24 1322  Wed Feb 19, 2024  1007 83 yo male w/ a fib on eliquis  here with near syncope, occurred this morning while sitting in his chair.  He denies chest pain or SOB with it.  Feels back to normal.  Felt fine this morning.  Reports similar episodes in the past, but cannot provide exact details.  On exam well appearing, afebrile, rate controlled A Fib on telemetry and ecg, BP minor soft on arrival, improving with IV fluids, no hypoxia.  CMP and CBC unremarkable.  Trop 35.  Needs repeat troponin, this may be chronic.  Xray no emergent findings.  This may be hypotensive episode from dehydration vs arrhythmia - will monitor on tele.  Doubt PE with pt compliant on A/C and no hypoxia [MT]    Clinical Course User Index [MT] Cottie Cough PARAS, MD                                  Medical Decision Making Patient presenting with an episode of witnessed syncope at rest, sitting in a chair, episode lasted at least 1 minutes, he had no prodromal symptoms.  He does have a history of A-fib and is in A-fib currently but rate controlled.  He is not orthostatic with these maneuvers here, and labs are reassuring, EKG A-fib but rate controlled.  Labs do not suggest dehydration.  I have concern for possible arrhythmia as source of today's event.  He has a history of aortic stenosis, he last had an echo 3 months ago which did not show advanced disease.  Amount and/or Complexity of Data Reviewed Labs: ordered.    Details: Labs reviewed including urinalysis, he does have 6-10 WBCs, rare bacteria, urine culture has been ordered.  Patient has no complaints of acute cystitis.  His delta troponins are elevated but flat.  CMET reviewed, glucose 150, his creatinine today is 0.99, electrolytes unremarkable, CBC normal, WBC count is 6.2. Radiology: ordered.    Details: Chest x-ray reviewed, no cardiopulmonary disease. ECG/medicine tests: ordered.    Details: Reviewed, A-fib, rate controlled at 73. Discussion of management or test interpretation with external provider(s): Call placed to hospitalist - discussed with Dr. Maree who will admit pt.  Risk Decision regarding hospitalization.        Final diagnoses:  Syncope, unspecified syncope type    ED Discharge Orders     None          Robi Dewolfe, PA-C 02/19/24 1343    Trifan, Matthew J, MD 02/19/24 1730

## 2024-02-19 NOTE — ED Triage Notes (Signed)
 Pt bib [facility] for a witnessed syncope episode that last about a minute. Pt is from [place]. Pt states he was sitting down and wasn't sure what happened, pt states he did not fall when this happened.

## 2024-02-19 NOTE — H&P (Signed)
 History and Physical    [name] FMW:[id number] DOB: 1940-12-27 DOA: 02/19/2024  PCP: Nichole Senior, MD   Patient coming from: ALF  Chief Complaint: Syncope  HPI: [name] is a 83 y.o. male with medical history significant for atrial fibrillation on apixaban , CVA, type 2 diabetes, hypertension, GERD, dyslipidemia, and OSA on CPAP who presented to the ED after a syncopal episode which occurred during breakfast at his assisted living facility.  He was apparently sitting in the chair at that time and had a witnessed syncopal event which lasted approximately 1 minute.  He had no prodromal symptoms of nausea, vomiting, palpitations, or dizziness.  He is endorsing generalized weakness, but denies any focal deficits and has not had breakfast this morning.  He otherwise has felt well up until that point and was admitted for syncopal episode on 12/2023 and was noted to be orthostatic at that time.   ED Course: Vital signs stable and patient afebrile.  Negative orthostatic vital signs and patient has received 500 mL fluid bolus.  Laboratory data otherwise within normal limits.  EKG with some atrial fibrillation that is rate controlled.  UA negative for UTI.  Review of Systems: Reviewed as noted above, otherwise negative.  Past Medical History:  Diagnosis Date   Atrial fibrillation (HCC)    Cholelithiasis 10/2011   Per CT. No cholecystitis radiographically.   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   Shortness of breath    exertion   Sleep apnea    cpap   Stroke [facility]) 2010/2013   Right Brain stroke    Past Surgical History:  Procedure Laterality Date   CARDIOVERSION  07/12/2011   Procedure: CARDIOVERSION;  Surgeon: Vinie KYM Maxcy, MD;   Location: [facility] ENDOSCOPY;  Service: Cardiovascular;  Laterality: N/A;   CAROTID DOPPLER STUDY  01/31/2012   no significant extracranial carotid artery stenosis   CATARACT EXTRACTION W/PHACO Left 01/30/2019   Procedure: CATARACT EXTRACTION PHACO AND INTRAOCULAR LENS PLACEMENT (IOC) (CDE: 4.76);  Surgeon: Harrie Agent, MD;  Location: [facility] ORS;  Service: Ophthalmology;  Laterality: Left;   COLONOSCOPY     DOPPLER ECHOCARDIOGRAPHY  01/31/2012   EF 55-60%, moderately calcified annulus of the mitral valve, left atrium demonstrated mild-moderately dilated   KNEE SURGERY     TEE WITHOUT CARDIOVERSION  07/12/2011   Procedure: TRANSESOPHAGEAL ECHOCARDIOGRAM (TEE);  Surgeon: Vinie KYM Maxcy, MD;  Location: [facility] ENDOSCOPY;  Service: Cardiovascular;  Laterality: N/A;  to be done at 1330   TONSILLECTOMY       reports that he has never smoked. He has never used smokeless tobacco. He reports current alcohol  use of about 2.0 standard drinks of alcohol  per week. He reports that he does not use drugs.  No Known Allergies  Family History  Problem Relation Age of Onset   Diabetes Mother    Diabetes Son    Pancreatic cancer Other        GRANDFATHER   Colon cancer Other        INTESTINAL, GRANDMOTHER    Prior to Admission medications   Medication Sig Start Date End Date Taking? Authorizing Provider  apixaban  (ELIQUIS ) 5 MG TABS tablet Take 1 tablet (5 mg total) by mouth  2 (two) times daily. 01/14/24  Yes Landy Barnie RAMAN, NP  Choline Fenofibrate  (FENOFIBRIC ACID ) 135 MG CPDR Take 1 capsule (135 mg total) by mouth daily. 01/14/24  Yes Landy Barnie RAMAN, NP  cyanocobalamin  (VITAMIN B12) 1000 MCG tablet Take 1,000 mcg by mouth daily.   Yes [provider]  escitalopram  (LEXAPRO ) 10 MG tablet Take 1 tablet (10 mg total) by mouth daily. 01/14/24  Yes Landy Barnie RAMAN, NP  folic acid  (FOLVITE ) 1 MG tablet Take 1 tablet (1 mg total) by mouth daily. 01/14/24  Yes Landy Barnie RAMAN, NP  hydrocortisone   (ANUSOL -HC) 2.5 % rectal cream Apply 1 Application topically 4 (four) times daily as needed for hemorrhoids or anal itching. 11/30/23  Yes Johnson, Clanford L, MD  hydrOXYzine  (ATARAX ) 10 MG tablet Take 10 mg by mouth at bedtime. 02/18/24  Yes [provider]  LANTUS  SOLOSTAR 100 UNIT/ML Solostar Pen Inject 20 Units into the skin in the morning. 02/12/24  Yes [provider]  magnesium  oxide (MAG-OX) 400 (240 Mg) MG tablet Take 1 tablet (400 mg total) by mouth daily. 01/14/24  Yes Landy Barnie RAMAN, NP  MELATONIN MAXIMUM STRENGTH 5 MG TABS Take 5 mg by mouth at bedtime as needed. 02/18/24  Yes [provider]  metoprolol  tartrate (LOPRESSOR ) 25 MG tablet Take 0.5 tablets (12.5 mg total) by mouth 2 (two) times daily. 01/14/24  Yes Landy Barnie RAMAN, NP  mirabegron  ER (MYRBETRIQ ) 50 MG TB24 tablet Take 1 tablet (50 mg total) by mouth daily. 01/14/24  Yes Landy Barnie RAMAN, NP  rosuvastatin  (CRESTOR ) 20 MG tablet Take 1 tablet (20 mg total) by mouth daily. 01/14/24  Yes Landy Barnie RAMAN, NP  EMBECTA AUTOSHIELD DUO 30G X 5 MM MISC  01/28/24   [provider]  linagliptin  (TRADJENTA ) 5 MG TABS tablet Take 1 tablet (5 mg total) by mouth daily. 01/14/24   Landy Barnie RAMAN, NP    Physical Exam: Vitals:   02/19/24 1100 02/19/24 1115 02/19/24 1130 02/19/24 1339  BP: 116/73 118/76 133/86   Pulse: 81 75 77   Resp: 18 15    Temp:    (!) 97.5 F (36.4 C)  TempSrc:    Oral  SpO2: 93% 93% 94%   Weight:      Height:        Constitutional: NAD, calm, comfortable Vitals:   02/19/24 1100 02/19/24 1115 02/19/24 1130 02/19/24 1339  BP: 116/73 118/76 133/86   Pulse: 81 75 77   Resp: 18 15    Temp:    (!) 97.5 F (36.4 C)  TempSrc:    Oral  SpO2: 93% 93% 94%   Weight:      Height:       Eyes: lids and conjunctivae normal Neck: normal, supple Respiratory: clear to auscultation bilaterally. Normal respiratory effort. No accessory muscle use.  Cardiovascular: Regular  rate and rhythm, no murmurs. Abdomen: no tenderness, no distention. Bowel sounds positive.  Musculoskeletal:  No edema. Skin: no rashes, lesions, ulcers.  Psychiatric: Flat affect  Labs on Admission: I have personally reviewed following labs and imaging studies  CBC: Recent Labs  Lab 02/19/24 0923  WBC 6.2  NEUTROABS 4.1  HGB 13.3  HCT 41.3  MCV 96.5  PLT 160   Basic Metabolic Panel: Recent Labs  Lab 02/19/24 0923  NA 140  K 3.8  CL 105  CO2 26  GLUCOSE 150*  BUN 17  CREATININE 0.99  CALCIUM  8.5*   GFR: Estimated Creatinine Clearance: 63.1  mL/min (by C-G formula based on SCr of 0.99 mg/dL). Liver Function Tests: Recent Labs  Lab 02/19/24 0923  AST 23  ALT 12  ALKPHOS 71  BILITOT 1.1  PROT 5.7*  ALBUMIN 3.2*   No results for input(s): LIPASE, AMYLASE in the last 168 hours. No results for input(s): AMMONIA in the last 168 hours. Coagulation Profile: No results for input(s): INR, PROTIME in the last 168 hours. Cardiac Enzymes: No results for input(s): CKTOTAL, CKMB, CKMBINDEX, TROPONINI in the last 168 hours. BNP (last 3 results) No results for input(s): PROBNP in the last 8760 hours. HbA1C: No results for input(s): HGBA1C in the last 72 hours. CBG: Recent Labs  Lab 02/19/24 0914  GLUCAP 159*   Lipid Profile: No results for input(s): CHOL, HDL, LDLCALC, TRIG, CHOLHDL, LDLDIRECT in the last 72 hours. Thyroid  Function Tests: No results for input(s): TSH, T4TOTAL, FREET4, T3FREE, THYROIDAB in the last 72 hours. Anemia Panel: No results for input(s): VITAMINB12, FOLATE, FERRITIN, TIBC, IRON, RETICCTPCT in the last 72 hours. Urine analysis:    Component Value Date/Time   COLORURINE AMBER (A) 02/19/2024 1223   APPEARANCEUR HAZY (A) 02/19/2024 1223   LABSPEC 1.014 02/19/2024 1223   PHURINE 7.0 02/19/2024 1223   GLUCOSEU NEGATIVE 02/19/2024 1223   HGBUR NEGATIVE 02/19/2024 1223   BILIRUBINUR  NEGATIVE 02/19/2024 1223   KETONESUR NEGATIVE 02/19/2024 1223   PROTEINUR 30 (A) 02/19/2024 1223   UROBILINOGEN 0.2 03/13/2014 2240   NITRITE NEGATIVE 02/19/2024 1223   LEUKOCYTESUR TRACE (A) 02/19/2024 1223    Radiological Exams on Admission: DG Chest Port 1 View Result Date: 02/19/2024 CLINICAL DATA:  Syncope. EXAM: PORTABLE CHEST 1 VIEW COMPARISON:  12/17/2023 FINDINGS: Lungs are adequately inflated without focal airspace consolidation or effusion. Cardiomediastinal silhouette is normal. Degenerative changes over the spine and shoulders. IMPRESSION: No active disease. Electronically Signed   By: Toribio Agreste M.D.   On: 02/19/2024 09:57    EKG: Independently reviewed.  Atrial fibrillation 73 bpm.  Assessment/Plan Principal Problem:   Syncope Active Problems:   Persistent atrial fibrillation (HCC)   DM type 2 (diabetes mellitus, type 2) (HCC)   Benign hypertension   Hyperlipemia   Aortic stenosis   Essential hypertension    Syncope - Had prior admission for syncope 12/2023, was orthostatic at that time, but negative for orthostasis currently - 500 mL fluid bolus received in ED - Obtain limited 2D echocardiogram - Carotid ultrasound - Monitor on telemetry - Fall precautions - Check D-dimer  Persistent atrial fibrillation - Monitor on telemetry - Continue apixaban  - Continue metoprolol   Type 2 diabetes with mild hyperglycemia - A1c 11/29/2023 7.6% - Carb modified diet and SSI  Essential hypertension - Continue on metoprolol  and monitor BP closely  Dyslipidemia/history of CVA - Continue statin - Not on aspirin  due to Eliquis  use  Mild to moderate aortic stenosis - Noted on 2D echocardiogram 11/2023 - Repeat as noted above  OSA - New CPAP at night   DVT prophylaxis: Apixaban  Code Status: DNR Family Communication: Discussed with staff at ALF and wife on phone Disposition Plan: Syncope evaluation Consults called: None Admission status: Observation,  telemetry  Severity of Illness: The appropriate patient status for this patient is OBSERVATION. Observation status is judged to be reasonable and necessary in order to provide the required intensity of service to ensure the patient's safety. The patient's presenting symptoms, physical exam findings, and initial radiographic and laboratory data in the context of their medical condition is felt to place them at  decreased risk for further clinical deterioration. Furthermore, it is anticipated that the patient will be medically stable for discharge from the hospital within 2 midnights of admission.    Ashlynne Shetterly D Maree DO [facility]  If 7PM-7AM, please contact night-coverage [email or web address]  02/19/2024, 1:51 PM

## 2024-02-19 NOTE — ED Notes (Signed)
 Pt is currently getting an echo will take pt upstairs when completed.

## 2024-02-20 ENCOUNTER — Observation Stay (HOSPITAL_COMMUNITY)

## 2024-02-20 ENCOUNTER — Other Ambulatory Visit (HOSPITAL_COMMUNITY): Payer: Self-pay | Admitting: *Deleted

## 2024-02-20 DIAGNOSIS — R55 Syncope and collapse: Secondary | ICD-10-CM

## 2024-02-20 LAB — ECHOCARDIOGRAM LIMITED
AR max vel: 1.09 cm2
AV Area VTI: 1.08 cm2
AV Area mean vel: 1.07 cm2
AV Mean grad: 11 mmHg
AV Peak grad: 19.5 mmHg
Ao pk vel: 2.21 m/s
Height: 68 in
P 1/2 time: 647 ms
S' Lateral: 3 cm
Weight: 2691.38 [oz_av]

## 2024-02-20 LAB — BASIC METABOLIC PANEL WITH GFR
Anion gap: 10 (ref 5–15)
Anion gap: 10 (ref 5–15)
BUN: 25 mg/dL — ABNORMAL HIGH (ref 8–23)
BUN: 25 mg/dL — ABNORMAL HIGH (ref 8–23)
CO2: 24 mmol/L (ref 22–32)
CO2: 24 mmol/L (ref 22–32)
Calcium: 8.6 mg/dL — ABNORMAL LOW (ref 8.9–10.3)
Calcium: 8.6 mg/dL — ABNORMAL LOW (ref 8.9–10.3)
Chloride: 106 mmol/L (ref 98–111)
Chloride: 107 mmol/L (ref 98–111)
Creatinine, Ser: 1.33 mg/dL — ABNORMAL HIGH (ref 0.61–1.24)
Creatinine, Ser: 1.07 mg/dL (ref 0.61–1.24)
GFR, Estimated: 53 mL/min — ABNORMAL LOW (ref >60)
GFR, Estimated: >60 mL/min (ref >60)
Glucose, Bld: 132 mg/dL — ABNORMAL HIGH (ref 70–99)
Glucose, Bld: 212 mg/dL — ABNORMAL HIGH (ref 70–99)
Potassium: 3.5 mmol/L (ref 3.5–5.1)
Potassium: 3.6 mmol/L (ref 3.5–5.1)
Sodium: 140 mmol/L (ref 135–145)
Sodium: 141 mmol/L (ref 135–145)

## 2024-02-20 LAB — GLUCOSE, CAPILLARY
Glucose-Capillary: 138 mg/dL — ABNORMAL HIGH (ref 70–99)
Glucose-Capillary: 180 mg/dL — ABNORMAL HIGH (ref 70–99)

## 2024-02-20 LAB — CBC
HCT: 40.4 % (ref 39.0–52.0)
Hemoglobin: 13 g/dL (ref 13.0–17.0)
MCH: 30.9 pg (ref 26.0–34.0)
MCHC: 32.2 g/dL (ref 30.0–36.0)
MCV: 96 fL (ref 80.0–100.0)
Platelets: 143 K/uL — ABNORMAL LOW (ref 150–400)
RBC: 4.21 MIL/uL — ABNORMAL LOW (ref 4.22–5.81)
RDW: 13.1 % (ref 11.5–15.5)
WBC: 5.8 K/uL (ref 4.0–10.5)
nRBC: 0 % (ref 0.0–0.2)

## 2024-02-20 LAB — MAGNESIUM: Magnesium: 1.9 mg/dL (ref 1.7–2.4)

## 2024-02-20 MED ORDER — SODIUM CHLORIDE 0.9 % IV BOLUS
1000.0000 mL | Freq: Once | INTRAVENOUS | Status: AC
  Administered 2024-02-20: 1000 mL via INTRAVENOUS

## 2024-02-20 NOTE — Discharge Summary (Signed)
 Physician Discharge Summary  LATEEF JUNCAJ FMW:[id number] DOB: [birth year] DOA: 02/19/2024  PCP: Nichole Senior, MD  Admit date: 02/19/2024  Discharge date: 02/20/2024  Admitted From:ALF  Disposition:  ALF  Recommendations for Outpatient Follow-up:  Follow up with PCP in 1-2 weeks Follow-up with cardiologist Dr. Ardelle with referral sent regarding progression of aortic stenosis. Continue home medications as prior  Home Health: None  Equipment/Devices: None  Discharge Condition:Stable  CODE STATUS: DNR  Diet recommendation: Dysphagia 2 diet with nectar thick liquids  Brief/Interim Summary: [name] is a 83 y.o. male with medical history significant for atrial fibrillation on apixaban , CVA, type 2 diabetes, hypertension, GERD, dyslipidemia, and OSA on CPAP who presented to the ED after a syncopal episode which occurred during breakfast at his assisted living facility.  His workup has revealed what appears to be progression of moderate aortic stenosis which is likely responsible for syncopal episode.  He did not appear dehydrated or orthostatic and carotid ultrasounds were within normal limits.  No significant arrhythmias have been noted during the course of this admission either.  He will need close follow-up with his cardiologist as noted above and referral has been sent for this.  No other acute events or concerns noted.  Discharge Diagnoses:  Principal Problem:   Syncope Active Problems:   Persistent atrial fibrillation (HCC)   DM type 2 (diabetes mellitus, type 2) (HCC)   Benign hypertension   Hyperlipemia   Aortic stenosis   Essential hypertension  Principal discharge diagnosis: Syncope likely in the setting of progression of moderate aortic stenosis.  Discharge Instructions  Discharge Instructions     Ambulatory referral to Cardiology   Complete by: As directed    Diet - low sodium heart healthy   Complete by: As directed    Increase activity slowly    Complete by: As directed       Allergies as of 02/20/2024   No Known Allergies      Medication List     TAKE these medications    apixaban  5 MG Tabs tablet Commonly known as: ELIQUIS  Take 1 tablet (5 mg total) by mouth 2 (two) times daily.   cyanocobalamin  1000 MCG tablet Commonly known as: VITAMIN B12 Take 1,000 mcg by mouth daily.   Embecta AutoShield Duo 30G X 5 MM Misc Generic drug: Insulin  Pen Needle   escitalopram  10 MG tablet Commonly known as: LEXAPRO  Take 1 tablet (10 mg total) by mouth daily.   Fenofibric Acid  135 MG Cpdr Take 1 capsule (135 mg total) by mouth daily.   folic acid  1 MG tablet Commonly known as: FOLVITE  Take 1 tablet (1 mg total) by mouth daily.   hydrocortisone  2.5 % rectal cream Commonly known as: ANUSOL -HC Apply 1 Application topically 4 (four) times daily as needed for hemorrhoids or anal itching.   hydrOXYzine 10 MG tablet Commonly known as: ATARAX Take 10 mg by mouth at bedtime.   Lantus  SoloStar 100 UNIT/ML Solostar Pen Generic drug: insulin  glargine Inject 20 Units into the skin in the morning.   linagliptin  5 MG Tabs tablet Commonly known as: Tradjenta  Take 1 tablet (5 mg total) by mouth daily.   magnesium  oxide 400 (240 Mg) MG tablet Commonly known as: MAG-OX Take 1 tablet (400 mg total) by mouth daily.   Melatonin Maximum Strength 5 MG Tabs Generic drug: melatonin Take 5 mg by mouth at bedtime as needed.   metoprolol  tartrate 25 MG tablet Commonly known as: LOPRESSOR  Take 0.5 tablets (12.5 mg  total) by mouth 2 (two) times daily.   mirabegron  ER 50 MG Tb24 tablet Commonly known as: MYRBETRIQ  Take 1 tablet (50 mg total) by mouth daily.   rosuvastatin  20 MG tablet Commonly known as: CRESTOR  Take 1 tablet (20 mg total) by mouth daily.        Follow-up Information     Mallipeddi, Vishnu P, MD. Go to.   Specialties: Cardiology, Internal Medicine Contact information: [address] [place]  [address] [phone]         Nichole Senior, MD. Schedule an appointment as soon as possible for a visit in 1 week(s).   Specialty: Endocrinology Contact information: [address] [place] [address] [phone]                No Known Allergies  Consultations: None   Procedures/Studies: ECHOCARDIOGRAM LIMITED Result Date: 02/20/2024    ECHOCARDIOGRAM LIMITED REPORT   Patient Name:   [name] Date of Exam: 02/20/2024 Medical Rec #:  [id number]    Height:       68.0 in Accession #:    [id number]   Weight:       168.2 lb Date of Birth:  1940-07-29    BSA:          1.899 m Patient Age:    83 years     BP:           124/88 mmHg Patient Gender: M            HR:           81 bpm. Exam Location:  [facility] Procedure: Limited Echo, Cardiac Doppler and Color Doppler (Both Spectral and            Color Flow Doppler were utilized during procedure). Indications:    Syncope R55  History:        Patient has prior history of Echocardiogram examinations, most                 recent 11/29/2023. TIA and Stroke, Arrythmias:Atrial                 Fibrillation; Risk Factors:Sleep Apnea, Hypertension, Diabetes                 and Dyslipidemia.  Sonographer:    Aida Pizza RCS Referring Phys: [id number] Agron Swiney D [facility] IMPRESSIONS  1. Left ventricular ejection fraction, by estimation, is 60 to 65%. The left ventricle has normal function. The left ventricle has no regional wall motion abnormalities. There is mild left ventricular hypertrophy.  2. Right ventricular systolic function is normal. The right ventricular size is normal. There is mildly elevated pulmonary artery systolic pressure.  3. Left atrial size was severely dilated.  4. Right atrial size was severely dilated.  5. The mitral valve is abnormal. Mild mitral valve regurgitation.  6. The tricuspid valve is abnormal. Tricuspid valve regurgitation is moderate.  7. Moderate paradoxical low flow low gradient aortic stenosis. AVA VTI 1.08, mean  grad 11, DI 0.28, SVI 26. . The aortic valve is tricuspid. There is mild calcification of the aortic valve. There is mild thickening of the aortic valve. Aortic valve regurgitation is moderate. Moderate aortic valve stenosis. Aortic valve area, by VTI measures 1.08 cm. Aortic valve mean gradient measures 11.0 mmHg.  8. The inferior vena cava is normal in size with greater than 50% respiratory variability, suggesting right atrial pressure of 3 mmHg. FINDINGS  Left Ventricle: Left ventricular ejection fraction, by  estimation, is 60 to 65%. The left ventricle has normal function. The left ventricle has no regional wall motion abnormalities. The left ventricular internal cavity size was normal in size. There is  mild left ventricular hypertrophy. Right Ventricle: The right ventricular size is normal. Right vetricular wall thickness was not well visualized. Right ventricular systolic function is normal. There is mildly elevated pulmonary artery systolic pressure. The tricuspid regurgitant velocity  is 2.94 m/s, and with an assumed right atrial pressure of 3 mmHg, the estimated right ventricular systolic pressure is 37.6 mmHg. Left Atrium: Left atrial size was severely dilated. Right Atrium: Right atrial size was severely dilated. Mitral Valve: The mitral valve is abnormal. There is mild thickening of the mitral valve leaflet(s). There is mild calcification of the mitral valve leaflet(s). Mild mitral annular calcification. Mild mitral valve regurgitation. Tricuspid Valve: The tricuspid valve is abnormal. Tricuspid valve regurgitation is moderate . No evidence of tricuspid stenosis. Aortic Valve: Moderate paradoxical low flow low gradient aortic stenosis. AVA VTI 1.08, mean grad 11, DI 0.28, SVI 26. The aortic valve is tricuspid. There is mild calcification of the aortic valve. There is mild thickening of the aortic valve. There is mild aortic valve annular calcification. Aortic valve regurgitation is moderate. Aortic  regurgitation PHT measures 647 msec. Moderate aortic stenosis is present. Aortic valve mean gradient measures 11.0 mmHg. Aortic valve peak gradient measures 19.5 mmHg. Aortic valve area, by VTI measures 1.08 cm. Venous: The inferior vena cava was not well visualized. The inferior vena cava is normal in size with greater than 50% respiratory variability, suggesting right atrial pressure of 3 mmHg. LEFT VENTRICLE PLAX 2D LVIDd:         4.70 cm LVIDs:         3.00 cm LV PW:         1.20 cm LV IVS:        1.10 cm LVOT diam:     2.20 cm LV SV:         49 LV SV Index:   26 LVOT Area:     3.80 cm  RIGHT VENTRICLE TAPSE (M-mode): 1.8 cm LEFT ATRIUM              Index        RIGHT ATRIUM           Index LA diam:        4.40 cm  2.32 cm/m   RA Area:     30.50 cm LA Vol (A2C):   134.0 ml 70.57 ml/m  RA Volume:   110.00 ml 57.93 ml/m LA Vol (A4C):   101.0 ml 53.19 ml/m LA Biplane Vol: 121.0 ml 63.72 ml/m  AORTIC VALVE AV Area (Vmax):    1.09 cm AV Area (Vmean):   1.07 cm AV Area (VTI):     1.08 cm AV Vmax:           221.00 cm/s AV Vmean:          147.000 cm/s AV VTI:            0.453 m AV Peak Grad:      19.5 mmHg AV Mean Grad:      11.0 mmHg LVOT Vmax:         63.10 cm/s LVOT Vmean:        41.300 cm/s LVOT VTI:          0.129 m LVOT/AV VTI ratio: 0.28 AI PHT:  647 msec  AORTA Ao Root diam: 3.70 cm TRICUSPID VALVE TR Peak grad:   34.6 mmHg TR Vmax:        294.00 cm/s  SHUNTS Systemic VTI:  0.13 m Systemic Diam: 2.20 cm Dorn Ross MD Electronically signed by Dorn Ross MD Signature Date/Time: 02/20/2024/2:46:55 PM    Final    US  Carotid Bilateral Result Date: 02/19/2024 CLINICAL DATA:  Syncope. Chronic atrial fibrillation. Patient has a history of hypertension, common diabetes, previous TIA, and hyperlipidemia. EXAM: BILATERAL CAROTID DUPLEX ULTRASOUND TECHNIQUE: Elnor scale imaging, color Doppler and duplex ultrasound were performed of bilateral carotid and vertebral arteries in the neck.  COMPARISON:  None Available. FINDINGS: Criteria: Quantification of carotid stenosis is based on velocity parameters that correlate the residual internal carotid diameter with NASCET-based stenosis levels, using the diameter of the distal internal carotid lumen as the denominator for stenosis measurement. The following velocity measurements were obtained: RIGHT ICA: 31/6 cm/sec CCA: 30/2 cm/sec SYSTOLIC ICA/CCA RATIO:  1.0 ECA: 59 PSV LEFT ICA: 37/8 cm/sec CCA: 28/4 cm/sec SYSTOLIC ICA/CCA RATIO:  1.3 ECA: 40 PSV RIGHT CAROTID ARTERY: Mixed irregular plaque identified throughout the right common carotid artery. Mixed irregular plaque identified in the right carotid bulb. RIGHT VERTEBRAL ARTERY:  Antegrade arterial flow LEFT CAROTID ARTERY: Mixed irregular plaque identified throughout the left common carotid artery. LEFT VERTEBRAL ARTERY: Mixed irregular plaque is present in the left carotid bulb. The left ICA demonstrates mixed irregular plaque. IMPRESSION: No significant carotid artery stenosis identified in the extracranial arteries bilaterally. Electronically Signed   By: Cordella Banner   On: 02/19/2024 15:20   DG Chest Port 1 View Result Date: 02/19/2024 CLINICAL DATA:  Syncope. EXAM: PORTABLE CHEST 1 VIEW COMPARISON:  12/17/2023 FINDINGS: Lungs are adequately inflated without focal airspace consolidation or effusion. Cardiomediastinal silhouette is normal. Degenerative changes over the spine and shoulders. IMPRESSION: No active disease. Electronically Signed   By: Toribio Agreste M.D.   On: 02/19/2024 09:57     Discharge Exam: Vitals:   02/20/24 0859 02/20/24 1340  BP: 124/88 108/61  Pulse: 81 76  Resp:  18  Temp: (!) 97.5 F (36.4 C) 98 F (36.7 C)  SpO2: 96%    Vitals:   02/19/24 2337 02/20/24 0335 02/20/24 0859 02/20/24 1340  BP: 113/72 (!) 134/98 124/88 108/61  Pulse: 84 100 81 76  Resp: 17 18  18   Temp: 97.8 F (36.6 C) 97.9 F (36.6 C) (!) 97.5 F (36.4 C) 98 F (36.7 C)   TempSrc: Oral Axillary Oral Oral  SpO2: 97% 97% 96%   Weight:      Height:        General: Pt is alert, awake, not in acute distress Cardiovascular: RRR, S1/S2 +, no rubs, no gallops Respiratory: CTA bilaterally, no wheezing, no rhonchi Abdominal: Soft, NT, ND, bowel sounds + Extremities: no edema, no cyanosis    The results of significant diagnostics from this hospitalization (including imaging, microbiology, ancillary and laboratory) are listed below for reference.     Microbiology: No results found for this or any previous visit (from the past 240 hours).   Labs: BNP (last 3 results) No results for input(s): BNP in the last 8760 hours. Basic Metabolic Panel: Recent Labs  Lab 02/19/24 0923 02/19/24 1423 02/20/24 0446 02/20/24 1326  NA 140  --  140 141  K 3.8  --  3.5 3.6  CL 105  --  106 107  CO2 26  --  24 24  GLUCOSE 150*  --  132* 212*  BUN 17  --  25* 25*  CREATININE 0.99  --  1.33* 1.07  CALCIUM  8.5*  --  8.6* 8.6*  MG  --  2.0 1.9  --    Liver Function Tests: Recent Labs  Lab 02/19/24 0923  AST 23  ALT 12  ALKPHOS 71  BILITOT 1.1  PROT 5.7*  ALBUMIN 3.2*   No results for input(s): LIPASE, AMYLASE in the last 168 hours. No results for input(s): AMMONIA in the last 168 hours. CBC: Recent Labs  Lab 02/19/24 0923 02/20/24 0446  WBC 6.2 5.8  NEUTROABS 4.1  --   HGB 13.3 13.0  HCT 41.3 40.4  MCV 96.5 96.0  PLT 160 143*   Cardiac Enzymes: No results for input(s): CKTOTAL, CKMB, CKMBINDEX, TROPONINI in the last 168 hours. BNP: Invalid input(s): POCBNP CBG: Recent Labs  Lab 02/19/24 0914 02/19/24 1620 02/19/24 1932 02/20/24 0708 02/20/24 1118  GLUCAP 159* 138* 144* 138* 180*   D-Dimer Recent Labs    02/19/24 1423  DDIMER 1.51*   Hgb A1c No results for input(s): HGBA1C in the last 72 hours. Lipid Profile No results for input(s): CHOL, HDL, LDLCALC, TRIG, CHOLHDL, LDLDIRECT in the last 72  hours. Thyroid  function studies No results for input(s): TSH, T4TOTAL, T3FREE, THYROIDAB in the last 72 hours.  Invalid input(s): FREET3 Anemia work up No results for input(s): VITAMINB12, FOLATE, FERRITIN, TIBC, IRON, RETICCTPCT in the last 72 hours. Urinalysis    Component Value Date/Time   COLORURINE AMBER (A) 02/19/2024 1223   APPEARANCEUR HAZY (A) 02/19/2024 1223   LABSPEC 1.014 02/19/2024 1223   PHURINE 7.0 02/19/2024 1223   GLUCOSEU NEGATIVE 02/19/2024 1223   HGBUR NEGATIVE 02/19/2024 1223   BILIRUBINUR NEGATIVE 02/19/2024 1223   KETONESUR NEGATIVE 02/19/2024 1223   PROTEINUR 30 (A) 02/19/2024 1223   UROBILINOGEN 0.2 03/13/2014 2240   NITRITE NEGATIVE 02/19/2024 1223   LEUKOCYTESUR TRACE (A) 02/19/2024 1223   Sepsis Labs Recent Labs  Lab 02/19/24 0923 02/20/24 0446  WBC 6.2 5.8   Microbiology No results found for this or any previous visit (from the past 240 hours).   Time coordinating discharge: 35 minutes  SIGNED:   Adron JONETTA Fairly, DO [facility] 02/20/2024, 2:55 PM  If 7PM-7AM, please contact night-coverage [email or web address]

## 2024-02-20 NOTE — Progress Notes (Signed)
*  PRELIMINARY RESULTS* Echocardiogram Limited 2-D Echocardiogram has been performed.  Teresa Aida PARAS 02/20/2024, 2:19 PM

## 2024-02-20 NOTE — TOC Transition Note (Addendum)
 Transition of Care [facility]) - Discharge Note   Patient Details  Name: [name] MRN: [id number] Date of Birth: 02-19-1941  [facility] CM/SW Contact:  Sharlyne Stabs, RN Phone Number: 02/20/2024, 3:44 PM   Clinical Narrative:   Patient medically ready to return to [facility]. Confirmed with Chasity he is good to return, They do not have transportation, She suggested [employer] wheelchair 3m company. CM called [facility] and scheduled, waiting on ETA, RN updated. CM called his wife to update her with discharge.     Addendum : [facility] called, He will come to transport patient back. Team updated.    Final next level of care: Assisted Living Barriers to Discharge: Barriers Resolved   Patient Goals and CMS Choice Patient states their goals for this hospitalization and ongoing recovery are:: return to [facility]   Discharge Placement           Patient and family notified of of transfer: 02/20/24  Discharge Plan and Services Additional resources added to the After Visit Summary for        Social Drivers of Health (SDOH) Interventions SDOH Screenings   Food Insecurity: Patient Unable To Answer (02/19/2024)  Housing: Patient Unable To Answer (02/19/2024)  Transportation Needs: Patient Unable To Answer (02/19/2024)  Utilities: Patient Unable To Answer (02/19/2024)  Social Connections: Patient Unable To Answer (02/19/2024)  Recent Concern: Social Connections - Moderately Isolated (12/17/2023)  Tobacco Use: Low Risk  (02/19/2024)

## 2024-02-20 NOTE — Progress Notes (Signed)
 Nurse at bedside,feeding patient. Patient ate over 45 percent of his meal, he did ok,he did have some coughing after his meal times one. Dr Adron Fairly notified. Plan of care on going.

## 2024-02-20 NOTE — Progress Notes (Signed)
 Nurse at bedside,patient alert and oriented to person,confused to place,time and situation.No c/o pain or discomfort noted. Plan of care on going.

## 2024-02-20 NOTE — Progress Notes (Signed)
 Patient discharged back to [facility]. Discharge packet sent with patient. [facility] provided transportation.

## 2024-02-20 NOTE — Plan of Care (Signed)
  Problem: Education: Goal: Knowledge of General Education information will improve Description: Including pain rating scale, medication(s)/side effects and non-pharmacologic comfort measures Outcome: Progressing   Problem: Clinical Measurements: Goal: Ability to maintain clinical measurements within normal limits will improve Outcome: Progressing Goal: Respiratory complications will improve Outcome: Progressing Goal: Cardiovascular complication will be avoided Outcome: Progressing   Problem: Safety: Goal: Ability to remain free from injury will improve Outcome: Progressing   Problem: Skin Integrity: Goal: Risk for impaired skin integrity will decrease Outcome: Progressing   Problem: Metabolic: Goal: Ability to maintain appropriate glucose levels will improve Outcome: Progressing

## 2024-02-20 NOTE — Care Management Obs Status (Signed)
 MEDICARE OBSERVATION STATUS NOTIFICATION   Patient Details  Name: [name] MRN: [id number] Date of Birth: 1940/04/22   Medicare Observation Status Notification Given:  Yes    Duwaine LITTIE Ada 02/20/2024, 3:47 PM

## 2024-02-20 NOTE — TOC Initial Note (Signed)
 Transition of Care [facility]) - Inpatient Brief Assessment   Patient Details  Name: [name] MRN: [id number] Date of Birth: 02-26-1941  Transition of Care [facility]) CM/SW Contact:    Sharlyne Stabs, RN Phone Number: 02/20/2024, 9:48 AM   Clinical Narrative: Patient admitted in OBS with syncope. [facility]. ECHO pending, if normal, patient can discharge back to [facility]. CM left Chasity a message to update. IPCM following.    Transition of Care Asessment: Insurance and Status: Insurance coverage has been reviewed Patient has primary care physician: Yes Home environment has been reviewed: [facility] Prior level of function:: Needs assistance Prior/Current Home Services: No current home services Social Drivers of Health Review: SDOH reviewed no interventions necessary Readmission risk has been reviewed: Yes Transition of care needs: no transition of care needs at this time     Barriers to Discharge: Continued Medical Work up   Patient Goals and CMS Choice Patient states their goals for this hospitalization and ongoing recovery are:: return to [facility]     Expected Discharge Plan and Services    [facility]   Patient language and need for interpreter reviewed:: Yes        Need for Family Participation in Patient Care: Yes (Comment) Care giver support system in place?: Yes (comment) Current home services: DME Criminal Activity/Legal Involvement Pertinent to Current Situation/Hospitalization: No - Comment as needed  Activities of Daily Living   ADL Screening (condition at time of admission) Independently performs ADLs?: No Does the patient have a NEW difficulty with bathing/dressing/toileting/self-feeding that is expected to last >3 days?: No Does the patient have a NEW difficulty with getting in/out of bed, walking, or climbing stairs that is expected to last >3 days?: No Does the patient have a NEW difficulty with communication that is expected to last  >3 days?: No Is the patient deaf or have difficulty hearing?: No Does the patient have difficulty seeing, even when wearing glasses/contacts?: No Does the patient have difficulty concentrating, remembering, or making decisions?: Yes  Permission Sought/Granted        Emotional Assessment     Affect (typically observed): Accepting Orientation: : Oriented to Self Alcohol  / Substance Use: Not Applicable Psych Involvement: No (comment)  Admission diagnosis:  Syncope [R55] Syncope, unspecified syncope type [R55] Patient Active Problem List   Diagnosis Date Noted   Syncope 02/19/2024   Traumatic rhabdomyolysis 12/19/2023   AKI (acute kidney injury) 12/17/2023   Orthostatic hypotension 12/17/2023   DM (diabetes mellitus), secondary, with peripheral vascular complications (HCC) 12/04/2023   Hyperlipidemia associated with type 2 diabetes mellitus (HCC) 12/03/2023   Hypertension associated with type 2 diabetes mellitus (HCC) 12/03/2023   Major depression, recurrent, chronic 12/03/2023   OAB (overactive bladder) 12/03/2023   Aortic atherosclerosis 12/03/2023   Major neurocognitive disorder (HCC) 12/03/2023   Aphasia 11/29/2023   TIA (transient ischemic attack) 11/29/2023   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   Essential hypertension 11/18/2017   Aortic stenosis 04/27/2014   PAF (paroxysmal atrial fibrillation) (HCC) 09/15/2013   Current use of long term anticoagulation 09/15/2013   Dehydration 10/26/2011   Persistent atrial fibrillation (HCC) 05/15/2011   DM type 2 (diabetes mellitus, type 2) (HCC) 05/15/2011   Hyperlipemia 05/15/2011   Obesity 05/15/2011   Benign hypertension 05/15/2011   H/O: stroke 05/15/2011   PCP:  Nichole Senior, MD Pharmacy:   [facility] [address] [place] - [address] [place] [address] Phone: [phone] Fax: [phone]  Social Drivers of Health (SDOH) Social History: SDOH Screenings   Food Insecurity:  Patient Unable To Answer (02/19/2024)  Housing: Patient Unable To Answer (02/19/2024)  Transportation Needs: Patient Unable To Answer (02/19/2024)  Utilities: Patient Unable To Answer (02/19/2024)  Social Connections: Patient Unable To Answer (02/19/2024)  Recent Concern: Social Connections - Moderately Isolated (12/17/2023)  Tobacco Use: Low Risk  (02/19/2024)

## 2024-02-21 DIAGNOSIS — G4700 Insomnia, unspecified: Secondary | ICD-10-CM | POA: Diagnosis not present

## 2024-02-21 DIAGNOSIS — R41841 Cognitive communication deficit: Secondary | ICD-10-CM | POA: Diagnosis not present

## 2024-02-22 LAB — URINE CULTURE: Culture: >=100000 — AB

## 2024-02-24 DIAGNOSIS — R001 Bradycardia, unspecified: Secondary | ICD-10-CM | POA: Diagnosis not present

## 2024-02-24 DIAGNOSIS — I350 Nonrheumatic aortic (valve) stenosis: Secondary | ICD-10-CM | POA: Diagnosis not present

## 2024-02-24 DIAGNOSIS — I4819 Other persistent atrial fibrillation: Secondary | ICD-10-CM | POA: Diagnosis not present

## 2024-02-24 DIAGNOSIS — R55 Syncope and collapse: Secondary | ICD-10-CM | POA: Diagnosis not present

## 2024-02-24 DIAGNOSIS — I959 Hypotension, unspecified: Secondary | ICD-10-CM | POA: Diagnosis not present

## 2024-02-24 DIAGNOSIS — E782 Mixed hyperlipidemia: Secondary | ICD-10-CM | POA: Diagnosis not present

## 2024-02-24 DIAGNOSIS — M25579 Pain in unspecified ankle and joints of unspecified foot: Secondary | ICD-10-CM | POA: Diagnosis not present

## 2024-02-25 ENCOUNTER — Encounter (HOSPITAL_COMMUNITY): Payer: Self-pay

## 2024-02-25 ENCOUNTER — Emergency Department (HOSPITAL_COMMUNITY)

## 2024-02-25 ENCOUNTER — Emergency Department (HOSPITAL_COMMUNITY)
Admission: EM | Admit: 2024-02-25 | Discharge: 2024-02-25 | Disposition: A | Discharge status: Skilled Nursing Facility | Attending: Emergency Medicine | Admitting: Emergency Medicine

## 2024-02-25 ENCOUNTER — Other Ambulatory Visit: Payer: Self-pay

## 2024-02-25 DIAGNOSIS — Z7901 Long term (current) use of anticoagulants: Secondary | ICD-10-CM | POA: Insufficient documentation

## 2024-02-25 DIAGNOSIS — W19XXXA Unspecified fall, initial encounter: Secondary | ICD-10-CM

## 2024-02-25 DIAGNOSIS — Z7401 Bed confinement status: Secondary | ICD-10-CM | POA: Diagnosis not present

## 2024-02-25 DIAGNOSIS — E039 Hypothyroidism, unspecified: Secondary | ICD-10-CM | POA: Diagnosis not present

## 2024-02-25 DIAGNOSIS — R918 Other nonspecific abnormal finding of lung field: Secondary | ICD-10-CM | POA: Diagnosis not present

## 2024-02-25 DIAGNOSIS — S0081XA Abrasion of other part of head, initial encounter: Secondary | ICD-10-CM | POA: Diagnosis not present

## 2024-02-25 DIAGNOSIS — T148XXA Other injury of unspecified body region, initial encounter: Secondary | ICD-10-CM

## 2024-02-25 DIAGNOSIS — I4891 Unspecified atrial fibrillation: Secondary | ICD-10-CM | POA: Insufficient documentation

## 2024-02-25 DIAGNOSIS — R609 Edema, unspecified: Secondary | ICD-10-CM | POA: Diagnosis not present

## 2024-02-25 DIAGNOSIS — M47812 Spondylosis without myelopathy or radiculopathy, cervical region: Secondary | ICD-10-CM | POA: Diagnosis not present

## 2024-02-25 DIAGNOSIS — R531 Weakness: Secondary | ICD-10-CM | POA: Diagnosis not present

## 2024-02-25 DIAGNOSIS — R059 Cough, unspecified: Secondary | ICD-10-CM | POA: Diagnosis not present

## 2024-02-25 DIAGNOSIS — E119 Type 2 diabetes mellitus without complications: Secondary | ICD-10-CM | POA: Insufficient documentation

## 2024-02-25 DIAGNOSIS — Z79899 Other long term (current) drug therapy: Secondary | ICD-10-CM | POA: Diagnosis not present

## 2024-02-25 DIAGNOSIS — E559 Vitamin D deficiency, unspecified: Secondary | ICD-10-CM | POA: Diagnosis not present

## 2024-02-25 DIAGNOSIS — Z8673 Personal history of transient ischemic attack (TIA), and cerebral infarction without residual deficits: Secondary | ICD-10-CM | POA: Diagnosis not present

## 2024-02-25 DIAGNOSIS — I10 Essential (primary) hypertension: Secondary | ICD-10-CM | POA: Insufficient documentation

## 2024-02-25 DIAGNOSIS — W06XXXA Fall from bed, initial encounter: Secondary | ICD-10-CM | POA: Insufficient documentation

## 2024-02-25 DIAGNOSIS — S0990XA Unspecified injury of head, initial encounter: Secondary | ICD-10-CM | POA: Diagnosis not present

## 2024-02-25 DIAGNOSIS — E782 Mixed hyperlipidemia: Secondary | ICD-10-CM | POA: Diagnosis not present

## 2024-02-25 DIAGNOSIS — I700 Atherosclerosis of aorta: Secondary | ICD-10-CM | POA: Diagnosis not present

## 2024-02-25 DIAGNOSIS — I6529 Occlusion and stenosis of unspecified carotid artery: Secondary | ICD-10-CM | POA: Diagnosis not present

## 2024-02-25 DIAGNOSIS — M4802 Spinal stenosis, cervical region: Secondary | ICD-10-CM | POA: Diagnosis not present

## 2024-02-25 DIAGNOSIS — S199XXA Unspecified injury of neck, initial encounter: Secondary | ICD-10-CM | POA: Diagnosis not present

## 2024-02-25 LAB — CBC WITH DIFFERENTIAL/PLATELET
Abs Immature Granulocytes: 0.03 K/uL (ref 0.00–0.07)
Basophils Absolute: 0.1 K/uL (ref 0.0–0.1)
Basophils Relative: 1 %
Eosinophils Absolute: 0.1 K/uL (ref 0.0–0.5)
Eosinophils Relative: 2 %
HCT: 44.8 % (ref 39.0–52.0)
Hemoglobin: 14.4 g/dL (ref 13.0–17.0)
Immature Granulocytes: 1 %
Lymphocytes Relative: 23 %
Lymphs Abs: 1.1 K/uL (ref 0.7–4.0)
MCH: 30.8 pg (ref 26.0–34.0)
MCHC: 32.1 g/dL (ref 30.0–36.0)
MCV: 95.9 fL (ref 80.0–100.0)
Monocytes Absolute: 0.6 K/uL (ref 0.1–1.0)
Monocytes Relative: 13 %
Neutro Abs: 2.8 K/uL (ref 1.7–7.7)
Neutrophils Relative %: 60 %
Platelets: 124 K/uL — ABNORMAL LOW (ref 150–400)
RBC: 4.67 MIL/uL (ref 4.22–5.81)
RDW: 12.8 % (ref 11.5–15.5)
WBC: 4.7 K/uL (ref 4.0–10.5)
nRBC: 0 % (ref 0.0–0.2)

## 2024-02-25 LAB — COMPREHENSIVE METABOLIC PANEL WITH GFR
ALT: 13 U/L (ref 0–44)
AST: 26 U/L (ref 15–41)
Albumin: 3.8 g/dL (ref 3.5–5.0)
Alkaline Phosphatase: 74 U/L (ref 38–126)
Anion gap: 6 (ref 5–15)
BUN: 14 mg/dL (ref 8–23)
CO2: 28 mmol/L (ref 22–32)
Calcium: 8.8 mg/dL — ABNORMAL LOW (ref 8.9–10.3)
Chloride: 107 mmol/L (ref 98–111)
Creatinine, Ser: 0.91 mg/dL (ref 0.61–1.24)
GFR, Estimated: >60 mL/min (ref >60)
Glucose, Bld: 243 mg/dL — ABNORMAL HIGH (ref 70–99)
Potassium: 3.9 mmol/L (ref 3.5–5.1)
Sodium: 142 mmol/L (ref 135–145)
Total Bilirubin: 1.1 mg/dL (ref 0.0–1.2)
Total Protein: 6.3 g/dL — ABNORMAL LOW (ref 6.5–8.1)

## 2024-02-25 MED ORDER — TRIPLE ANTIBIOTIC 3.5-400-5000 EX OINT
TOPICAL_OINTMENT | Freq: Once | CUTANEOUS | Status: AC
  Administered 2024-02-25: 1 via CUTANEOUS
  Filled 2024-02-25: qty 1

## 2024-02-25 NOTE — ED Triage Notes (Addendum)
 Pt fell out of bed tonight and hit head. On thinners. Abrasion to forehead. C-collar in place. Pt A&O x4.

## 2024-02-25 NOTE — ED Notes (Signed)
 Pt urinated on himself while trying to provide a urine sample. Pt changed and bedding changed. This RN noted a wound to left lower calf area and made EDP aware to assess wound. After assessment, wound care performed and gauze placed over wound.

## 2024-02-25 NOTE — ED Notes (Signed)
 Pt woken up and instructed to try and urinate at this time. Pt unable to. Provider notified.

## 2024-02-25 NOTE — ED Notes (Signed)
 Asked pt If he needed to pee, pt stated he could, nothing was in the urinal he had already urinated on himself, will ask again at a later time

## 2024-02-25 NOTE — ED Notes (Signed)
 Attempted a I & O cath at this time for urine sample. Pt was wet in urine when getting patient ready. Unsuccessful at this time. Condom cath placed on pt to obtain sample. Pt given some water for thirst at this time after repositioning.

## 2024-02-25 NOTE — Discharge Instructions (Addendum)
 It was a pleasure caring for you today in the emergency department.  Based on the events which brought you to the ER today, it is possible that you may have a concussion. A concussion occurs when there is a blow to the head or body, with enough force to shake the brain and disrupt how the brain functions. You may experience symptoms such as headaches, sensitivity to light/noise, dizziness, cognitive slowing, difficulty concentrating / remembering, trouble sleeping and drowsiness. These symptoms may last anywhere from hours/days to potentially weeks/months. While these symptoms are very frustrating and perhaps debilitating, it is important that you remember that they will improve over time. Everyone has a different rate of recovery; it is difficult to predict when your symptoms will resolve. In order to allow for your brain to heal after the injury, we recommend that you see your primary physician or a physician knowledgeable in concussion management. We also advise you to let your body and brain rest: avoid physical activities (sports, gym, and exercise) and reduce cognitive demands (reading, texting, TV watching, computer use, video games, etc). School attendance, after-school activities and work may need to be modified to avoid increasing symptoms. We recommend against driving until until all symptoms have resolved. Come back to the ER right away if you are having repeated episodes of vomiting, severe/worsening headache/dizziness or any other symptom that alarms you. We recommended that someone stay with you for the next 24 hours to monitor for these worrisome symptoms.  Clean  scalp abrasion with gentle soap and water 2x daily and apply bacitracin  or other antibiotic ointment following  Please return to the emergency department for any worsening or worrisome symptoms.

## 2024-02-25 NOTE — ED Provider Notes (Signed)
  Provider Note MRN:  [id number]  Arrival date & time: 02/25/24    ED Course and Medical Decision Making  Assumed care from Dr Lorette at shift change.  See note from prior team for complete details, in brief:  Clinical Course as of 02/25/24 1009  Tue Feb 25, 2024  9560 Initial Evaluation:  Patient presents with a bump on the head reportedly from a forceful impact with a mattress, without current pain, and resides in an assisted living facility. Plan: Ct head/neck/face - fall on thinners Cbc, cmp, ecg, UA to assess for causes of fall Monitor  [JM]  0722 Handoff JM 83 yo m Fall out of bed hit head UA pending  [SG]    Clinical Course User Index [JM] Mesner, Selinda, MD [SG] Elnor Savant A, DO    Imaging/labs stable Pt denies any urinary symptoms Will hold off on UA at this time. No suprapubic abd pain on exam, not septic.  Concussion precautions Wound care Stable for dc back to SNF    Procedures  Final Clinical Impressions(s) / ED Diagnoses     ICD-10-CM   1. Fall, initial encounter  W19.XXXA     2. Abrasion  T14.ASHTEN.BENDERS       ED Discharge Orders     None         Discharge Instructions      It was a pleasure caring for you today in the emergency department.  Based on the events which brought you to the ER today, it is possible that you may have a concussion. A concussion occurs when there is a blow to the head or body, with enough force to shake the brain and disrupt how the brain functions. You may experience symptoms such as headaches, sensitivity to light/noise, dizziness, cognitive slowing, difficulty concentrating / remembering, trouble sleeping and drowsiness. These symptoms may last anywhere from hours/days to potentially weeks/months. While these symptoms are very frustrating and perhaps debilitating, it is important that you remember that they will improve over time. Everyone has a different rate of recovery; it is difficult to predict when your symptoms  will resolve. In order to allow for your brain to heal after the injury, we recommend that you see your primary physician or a physician knowledgeable in concussion management. We also advise you to let your body and brain rest: avoid physical activities (sports, gym, and exercise) and reduce cognitive demands (reading, texting, TV watching, computer use, video games, etc). School attendance, after-school activities and work may need to be modified to avoid increasing symptoms. We recommend against driving until until all symptoms have resolved. Come back to the ER right away if you are having repeated episodes of vomiting, severe/worsening headache/dizziness or any other symptom that alarms you. We recommended that someone stay with you for the next 24 hours to monitor for these worrisome symptoms.  Please return to the emergency department for any worsening or worrisome symptoms.          Elnor Savant LABOR, DO 02/25/24 1009

## 2024-02-25 NOTE — ED Notes (Signed)
  at bedside

## 2024-02-25 NOTE — ED Provider Notes (Signed)
 Emergency Department Provider Note  TRIAGE NOTE: Pt fell out of bed tonight and hit head. On thinners. Abrasion to forehead. C-collar in place. Pt A&O x4.  HISTORY  Chief Complaint Fall   HPI [name] is a 83 y.o. male with  bump on the head, reportedly sustained from a forceful impact with a mattress. The patient denies any current pain associated with the injury. The patient resides in an assisted living facility. History was obtained from the patient who is a poor historian, nursing note and facility utilized for collateral. Unwitnessed fall out of the bed on ground. Unk LOC. No complaints at this time.   PMH Past Medical History:  Diagnosis Date   Atrial fibrillation (HCC)    Cholelithiasis 10/2011   Per CT. No cholecystitis radiographically.   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   Shortness of breath    exertion   Sleep apnea    cpap   Stroke [facility]) 2010/2013   Right Brain stroke    Home Medications Prior to Admission medications   Medication Sig Start Date End Date Taking? Authorizing Provider  apixaban  (ELIQUIS ) 5 MG TABS tablet Take 1 tablet (5 mg total) by mouth 2 (two) times daily. 01/14/24   Landy Barnie RAMAN, NP  Choline Fenofibrate  (FENOFIBRIC ACID ) 135 MG CPDR Take 1 capsule (135 mg total) by mouth daily. 01/14/24   Landy Barnie RAMAN, NP  cyanocobalamin  (VITAMIN B12) 1000 MCG tablet Take 1,000 mcg by mouth daily.    [provider]  EMBECTA AUTOSHIELD DUO 30G X 5 MM MISC  01/28/24   [provider]  escitalopram  (LEXAPRO ) 10 MG tablet Take 1 tablet (10 mg total) by mouth daily. 01/14/24   Landy Barnie RAMAN, NP  folic acid  (FOLVITE ) 1 MG tablet Take 1 tablet (1 mg total) by mouth daily.  01/14/24   Landy Barnie RAMAN, NP  hydrocortisone  (ANUSOL -HC) 2.5 % rectal cream Apply 1 Application topically 4 (four) times daily as needed for hemorrhoids or anal itching. 11/30/23   Johnson, Clanford L, MD  hydrOXYzine  (ATARAX ) 10 MG tablet Take 10 mg by mouth at bedtime. 02/18/24   [provider]  LANTUS  SOLOSTAR 100 UNIT/ML Solostar Pen Inject 20 Units into the skin in the morning. 02/12/24   [provider]  linagliptin  (TRADJENTA ) 5 MG TABS tablet Take 1 tablet (5 mg total) by mouth daily. 01/14/24   Landy Barnie RAMAN, NP  magnesium  oxide (MAG-OX) 400 (240 Mg) MG tablet Take 1 tablet (400 mg total) by mouth daily. 01/14/24   Landy Barnie RAMAN, NP  MELATONIN MAXIMUM STRENGTH 5 MG TABS Take 5 mg by mouth at bedtime as needed. 02/18/24   [provider]  metoprolol  tartrate (LOPRESSOR ) 25 MG tablet Take 0.5 tablets (12.5 mg total) by mouth 2 (two) times daily. 01/14/24   Landy Barnie RAMAN, NP  mirabegron  ER (MYRBETRIQ ) 50 MG TB24 tablet Take 1 tablet (50 mg total) by mouth daily. 01/14/24   Landy Barnie RAMAN, NP  rosuvastatin  (CRESTOR ) 20 MG tablet Take 1 tablet (20 mg total) by mouth daily. 01/14/24   Landy Barnie RAMAN, NP    Social History Social History   Tobacco Use   Smoking status:  Never   Smokeless tobacco: Never  Vaping Use   Vaping status: Never Used  Substance Use Topics   Alcohol  use: Yes    Alcohol /week: 2.0 standard drinks of alcohol     Types: 2 Glasses of wine per week    Comment: occasionally    Drug use: No    Review of Systems: Documented in HPI ____________________________________________  PHYSICAL EXAM: VITAL SIGNS: Triage: Blood pressure (!) 164/95, pulse 95, temperature 97.9 F (36.6 C), resp. rate 15, SpO2 99%.  Vitals:   02/25/24 0419 02/25/24 0420  BP: (!) 164/95   Pulse: (!) 108 95  Resp: 11 15  Temp: 97.9 F (36.6 C)   SpO2: 99% 99%    Physical Exam Vitals and nursing note reviewed.  Constitutional:      Appearance: He  is well-developed.  HENT:     Head: Normocephalic.     Comments: Abrasion and hematoma to forehead Cardiovascular:     Rate and Daniel: Normal rate. Daniel irregular.  Pulmonary:     Effort: Pulmonary effort is normal. No respiratory distress.  Abdominal:     General: There is no distension.     Tenderness: There is no abdominal tenderness.  Musculoskeletal:        General: Normal range of motion.     Cervical back: Normal range of motion.     Comments: No cervical spine tenderness, thoracic spine tenderness or Lumbar spine tenderness.  No tenderness or pain with palpation and full ROM of all joints in upper and lower extremities.  No ecchymosis or other signs of trauma on back or extremities.  No Pain with AP or lateral compression of ribs.  No Paracervical ttp, paraspinal ttp   Neurological:     Mental Status: He is alert.       ____________________________________________   LABS (all labs ordered are listed, but only abnormal results are displayed)  Labs Reviewed  CBC WITH DIFFERENTIAL/PLATELET  COMPREHENSIVE METABOLIC PANEL WITH GFR  URINALYSIS, ROUTINE W REFLEX MICROSCOPIC   ____________________________________________  EKG   EKG Interpretation Date/Time:    Ventricular Rate:    PR Interval:    QRS Duration:    QT Interval:    QTC Calculation:   R Axis:      Text Interpretation:          ____________________________________________  RADIOLOGY  No results found. ____________________________________________  PROCEDURES  Procedure(s) performed:   Procedures ____________________________________________  INITIAL IMPRESSION / ASSESSMENT AND PLAN   Clinical Course as of 02/26/24 0319  Tue Feb 25, 2024  0439 Initial Evaluation:  Patient presents with a bump on the head reportedly from a forceful impact with a mattress, without current pain, and resides in an assisted living facility. Plan: Ct head/neck/face - fall on thinners Cbc, cmp, ecg, UA  to assess for causes of fall Monitor  [JM]  0722 Handoff JM 83 yo m Fall out of bed hit head UA pending  [SG]    Clinical Course User Index [JM] Cleola Perryman, Selinda, MD [SG] Elnor Savant A, DO     Images ordered viewed and obtained by myself. Agree with Radiology interpretation. Details in ED course.  Labs ordered reviewed by myself as detailed in ED course.  Consultations obtained/considered detailed in ED course.    CRITICAL INTERVENTIONS:  N/A   Care transferred pending completion of workup, reevaluation and disposition.    Andreea Arca, Selinda, MD 02/26/24 [phone]

## 2024-03-09 ENCOUNTER — Telehealth: Payer: Self-pay | Admitting: Pharmacist

## 2024-03-09 DIAGNOSIS — N3281 Overactive bladder: Secondary | ICD-10-CM | POA: Diagnosis not present

## 2024-03-09 DIAGNOSIS — K219 Gastro-esophageal reflux disease without esophagitis: Secondary | ICD-10-CM | POA: Diagnosis not present

## 2024-03-09 DIAGNOSIS — E559 Vitamin D deficiency, unspecified: Secondary | ICD-10-CM | POA: Diagnosis not present

## 2024-03-09 DIAGNOSIS — E7800 Pure hypercholesterolemia, unspecified: Secondary | ICD-10-CM

## 2024-03-09 NOTE — Progress Notes (Signed)
   03/09/2024  Patient ID: [name], male   DOB: [birth year], 83 y.o.   MRN: [id number]  Pharmacy Quality Measure Review  This patient is appearing on a report for being at risk of failing the adherence measure for cholesterol (statin) medications this calendar year.   Medication:Losartan -HCTZ 100-25 Last fill date: 12/13/23 for 30 day supply  Reviewed chart. Losartan -HCTZ was discontinued due to orthostatic hypotension.  [name], PharmD, BCACP Clinical Pharmacist [phone]

## 2024-03-20 ENCOUNTER — Encounter: Payer: Self-pay | Admitting: Pharmacist

## 2024-03-20 NOTE — Progress Notes (Signed)
 Patient ID: [name], male   DOB: 1940-12-22, 83 y.o.   MRN: [id number]   Pharmacy Quality Measure Review  This patient is appearing on a report for being at risk of failing the adherence measure for cholesterol (statin) medications this calendar year.   Medication: Rosuvastatin   Last fill date: 02/18/24 for 7 day supply  Patient lives in a nursing facility and medications are filled in cycle packs then there is composite billing.  [name], PharmD, BCACP Clinical Pharmacist [phone]

## 2024-03-25 ENCOUNTER — Other Ambulatory Visit: Payer: Self-pay

## 2024-03-25 ENCOUNTER — Emergency Department (HOSPITAL_COMMUNITY)
Admission: EM | Admit: 2024-03-25 | Discharge: 2024-03-26 | Disposition: A | Discharge status: Home / Self Care | Attending: Emergency Medicine | Admitting: Emergency Medicine

## 2024-03-25 ENCOUNTER — Encounter (HOSPITAL_COMMUNITY): Payer: Self-pay | Admitting: Emergency Medicine

## 2024-03-25 DIAGNOSIS — W1830XA Fall on same level, unspecified, initial encounter: Secondary | ICD-10-CM | POA: Insufficient documentation

## 2024-03-25 DIAGNOSIS — R739 Hyperglycemia, unspecified: Secondary | ICD-10-CM | POA: Insufficient documentation

## 2024-03-25 DIAGNOSIS — S51011A Laceration without foreign body of right elbow, initial encounter: Secondary | ICD-10-CM | POA: Insufficient documentation

## 2024-03-25 DIAGNOSIS — Z7901 Long term (current) use of anticoagulants: Secondary | ICD-10-CM | POA: Diagnosis not present

## 2024-03-25 DIAGNOSIS — S0993XA Unspecified injury of face, initial encounter: Secondary | ICD-10-CM | POA: Diagnosis present

## 2024-03-25 DIAGNOSIS — W19XXXA Unspecified fall, initial encounter: Secondary | ICD-10-CM

## 2024-03-25 NOTE — ED Provider Notes (Signed)
 " [place] [facility] AT [facility] Provider Note   CSN: [id number] Arrival date & time: 03/25/24  2336     Patient presents with: [name] is a 83 y.o. male.  {Add pertinent medical, surgical, social history, OB history to YEP:67052} Patient presents to the emergency department from skilled nursing facility.  Patient had an unwitnessed fall tonight.  He comes to the emergency department by ambulance.  At arrival he is without complaints.  EMS indicates that he has a skin tear on his right arm and has had bleeding from the roof of his mouth.       Prior to Admission medications  Medication Sig Start Date End Date Taking? Authorizing Provider  apixaban  (ELIQUIS ) 5 MG TABS tablet Take 1 tablet (5 mg total) by mouth 2 (two) times daily. 01/14/24   Landy Barnie RAMAN, NP  Choline Fenofibrate  (FENOFIBRIC ACID ) 135 MG CPDR Take 1 capsule (135 mg total) by mouth daily. 01/14/24   Landy Barnie RAMAN, NP  cyanocobalamin  (VITAMIN B12) 1000 MCG tablet Take 1,000 mcg by mouth daily.    [provider]  EMBECTA AUTOSHIELD DUO 30G X 5 MM MISC  01/28/24   [provider]  escitalopram  (LEXAPRO ) 10 MG tablet Take 1 tablet (10 mg total) by mouth daily. 01/14/24   Landy Barnie RAMAN, NP  folic acid  (FOLVITE ) 1 MG tablet Take 1 tablet (1 mg total) by mouth daily. 01/14/24   Landy Barnie RAMAN, NP  hydrocortisone  (ANUSOL -HC) 2.5 % rectal cream Apply 1 Application topically 4 (four) times daily as needed for hemorrhoids or anal itching. 11/30/23   Johnson, Clanford L, MD  hydrOXYzine  (ATARAX ) 10 MG tablet Take 10 mg by mouth at bedtime. 02/18/24   [provider]  LANTUS  SOLOSTAR 100 UNIT/ML Solostar Pen Inject 20 Units into the skin in the morning. 02/12/24   [provider]  linagliptin  (TRADJENTA ) 5 MG TABS tablet Take 1 tablet (5 mg total) by mouth daily. 01/14/24   Landy Barnie RAMAN, NP  magnesium  oxide (MAG-OX) 400 (240 Mg) MG tablet Take 1  tablet (400 mg total) by mouth daily. 01/14/24   Landy Barnie RAMAN, NP  MELATONIN MAXIMUM STRENGTH 5 MG TABS Take 5 mg by mouth at bedtime as needed. 02/18/24   [provider]  metoprolol  tartrate (LOPRESSOR ) 25 MG tablet Take 0.5 tablets (12.5 mg total) by mouth 2 (two) times daily. 01/14/24   Landy Barnie RAMAN, NP  mirabegron  ER (MYRBETRIQ ) 50 MG TB24 tablet Take 1 tablet (50 mg total) by mouth daily. 01/14/24   Landy Barnie RAMAN, NP  rosuvastatin  (CRESTOR ) 20 MG tablet Take 1 tablet (20 mg total) by mouth daily. 01/14/24   Landy Barnie RAMAN, NP    Allergies: Patient has no known allergies.    Review of Systems  Updated Vital Signs BP 131/88   Pulse (!) 121   Temp 98.5 F (36.9 C) (Axillary)   Resp (!) 21   Ht 5' 8 (1.727 m)   Wt 76.3 kg   SpO2 97%   BMI 25.58 kg/m   Physical Exam Vitals and nursing note reviewed.  Constitutional:      General: He is not in acute distress.    Appearance: He is well-developed.  HENT:     Head: Normocephalic and atraumatic.     Comments: Blood on the roof of his mouth, no active bleeding.  Hard palate abnormal, possible mass present    Mouth/Throat:  Mouth: Mucous membranes are moist.  Eyes:     General: Vision grossly intact. Gaze aligned appropriately.     Extraocular Movements: Extraocular movements intact.     Conjunctiva/sclera: Conjunctivae normal.  Cardiovascular:     Rate and Rhythm: Normal rate and regular rhythm.     Pulses: Normal pulses.     Heart sounds: Normal heart sounds, S1 normal and S2 normal. No murmur heard.    No friction rub. No gallop.  Pulmonary:     Effort: Pulmonary effort is normal. No respiratory distress.     Breath sounds: Normal breath sounds.  Abdominal:     Palpations: Abdomen is soft.     Tenderness: There is no abdominal tenderness. There is no guarding or rebound.     Hernia: No hernia is present.  Musculoskeletal:        General: No swelling.     Cervical back: Full passive range of  motion without pain, normal range of motion and neck supple. No pain with movement, spinous process tenderness or muscular tenderness. Normal range of motion.     Right lower leg: No edema.     Left lower leg: No edema.  Skin:    General: Skin is warm and dry.     Capillary Refill: Capillary refill takes less than 2 seconds.     Findings: Laceration (Skin tear right elbow) present. No ecchymosis, erythema, lesion or wound.  Neurological:     Mental Status: He is alert and oriented to person, place, and time.     GCS: GCS eye subscore is 4. GCS verbal subscore is 5. GCS motor subscore is 6.     Cranial Nerves: Cranial nerves 2-12 are intact.     Sensory: Sensation is intact.     Motor: Motor function is intact. No weakness or abnormal muscle tone.     Coordination: Coordination is intact.  Psychiatric:        Mood and Affect: Mood normal.        Speech: Speech normal.        Behavior: Behavior normal.     (all labs ordered are listed, but only abnormal results are displayed) Labs Reviewed  CBC  BASIC METABOLIC PANEL WITH GFR    EKG: None  Radiology: No results found.  {Document cardiac monitor, telemetry assessment procedure when appropriate:32947} Procedures   Medications Ordered in the ED - No data to display    {Click here for ABCD2, HEART and other calculators REFRESH Note before signing:1}                              Medical Decision Making Amount and/or Complexity of Data Reviewed Labs: ordered.   ***  {Document critical care time when appropriate  Document review of labs and clinical decision tools ie CHADS2VASC2, etc  Document your independent review of radiology images and any outside records  Document your discussion with family members, caretakers and with consultants  Document social determinants of health affecting pt's care  Document your decision making why or why not admission, treatments were needed:32947:::1}   Final diagnoses:  None    ED  Discharge Orders     None        "

## 2024-03-25 NOTE — ED Triage Notes (Addendum)
 Pt BIB [facility] from [facility], pt was found by staff in the floor next to his bed. Pt noted to have bleeding from roof of mouth and small skin tear to right forearm. Pt at his baseline per facility staff.  Pt does take Eliquis .

## 2024-03-26 ENCOUNTER — Emergency Department (HOSPITAL_COMMUNITY)

## 2024-03-26 LAB — CBC
HCT: 42.2 % (ref 39.0–52.0)
Hemoglobin: 13.9 g/dL (ref 13.0–17.0)
MCH: 31.4 pg (ref 26.0–34.0)
MCHC: 32.9 g/dL (ref 30.0–36.0)
MCV: 95.5 fL (ref 80.0–100.0)
Platelets: 116 K/uL — ABNORMAL LOW (ref 150–400)
RBC: 4.42 MIL/uL (ref 4.22–5.81)
RDW: 13.2 % (ref 11.5–15.5)
WBC: 4.7 K/uL (ref 4.0–10.5)
nRBC: 0 % (ref 0.0–0.2)

## 2024-03-26 LAB — BASIC METABOLIC PANEL WITH GFR
Anion gap: 6 (ref 5–15)
BUN: 10 mg/dL (ref 8–23)
CO2: 32 mmol/L (ref 22–32)
Calcium: 9.4 mg/dL (ref 8.9–10.3)
Chloride: 100 mmol/L (ref 98–111)
Creatinine, Ser: 1.14 mg/dL (ref 0.61–1.24)
GFR, Estimated: >60 mL/min
Glucose, Bld: 676 mg/dL (ref 70–99)
Potassium: 4.3 mmol/L (ref 3.5–5.1)
Sodium: 138 mmol/L (ref 135–145)

## 2024-03-26 LAB — CBG MONITORING, ED
Glucose-Capillary: >600 mg/dL (ref 70–99)
Glucose-Capillary: 534 mg/dL (ref 70–99)
Glucose-Capillary: 361 mg/dL — ABNORMAL HIGH (ref 70–99)
Glucose-Capillary: 440 mg/dL — ABNORMAL HIGH (ref 70–99)
Glucose-Capillary: 252 mg/dL — ABNORMAL HIGH (ref 70–99)

## 2024-03-26 LAB — LACTIC ACID, PLASMA: Lactic Acid, Venous: 2.2 mmol/L (ref 0.5–1.9)

## 2024-03-26 MED ORDER — INSULIN REGULAR(HUMAN) IN NACL 100-0.9 UT/100ML-% IV SOLN
INTRAVENOUS | Status: DC
  Administered 2024-03-26: 13 [IU]/h via INTRAVENOUS
  Filled 2024-03-26: qty 100

## 2024-03-26 MED ORDER — METOPROLOL TARTRATE 25 MG PO TABS
25.0000 mg | ORAL_TABLET | Freq: Once | ORAL | Status: DC
  Filled 2024-03-26: qty 1

## 2024-03-26 MED ORDER — DEXTROSE 50 % IV SOLN: 0.0000 mL | INTRAVENOUS | Status: DC | PRN

## 2024-03-26 MED ORDER — IOHEXOL 300 MG/ML  SOLN
75.0000 mL | Freq: Once | INTRAMUSCULAR | Status: AC | PRN
  Administered 2024-03-26: 75 mL via INTRAVENOUS

## 2024-03-26 MED ORDER — SODIUM CHLORIDE 0.9 % IV SOLN: INTRAVENOUS | Status: DC

## 2024-03-26 NOTE — ED Notes (Signed)
 Patient transported to CT

## 2024-03-26 NOTE — ED Notes (Signed)
 Patient had soaked through two briefs and pants. Patient was changed and put in dry brief.

## 2024-03-26 NOTE — Discharge Instructions (Addendum)
 Do not give Eliquis  today.  May restart tomorrow.

## 2024-03-28 ENCOUNTER — Emergency Department (HOSPITAL_COMMUNITY)

## 2024-03-28 ENCOUNTER — Inpatient Hospital Stay (HOSPITAL_COMMUNITY)
Admission: EM | Admit: 2024-03-28 | Discharge: 2024-04-01 | DRG: 871 | Disposition: A | Discharge status: Skilled Nursing Facility | Source: Skilled Nursing Facility | Attending: Family Medicine | Admitting: Family Medicine

## 2024-03-28 ENCOUNTER — Encounter (HOSPITAL_COMMUNITY): Payer: Self-pay

## 2024-03-28 ENCOUNTER — Other Ambulatory Visit: Payer: Self-pay

## 2024-03-28 DIAGNOSIS — E782 Mixed hyperlipidemia: Secondary | ICD-10-CM | POA: Diagnosis not present

## 2024-03-28 DIAGNOSIS — E1100 Type 2 diabetes mellitus with hyperosmolarity without nonketotic hyperglycemic-hyperosmolar coma (NKHHC): Secondary | ICD-10-CM

## 2024-03-28 DIAGNOSIS — A4159 Other Gram-negative sepsis: Principal | ICD-10-CM | POA: Diagnosis present

## 2024-03-28 DIAGNOSIS — B961 Klebsiella pneumoniae [K. pneumoniae] as the cause of diseases classified elsewhere: Secondary | ICD-10-CM | POA: Diagnosis present

## 2024-03-28 DIAGNOSIS — B356 Tinea cruris: Secondary | ICD-10-CM | POA: Diagnosis not present

## 2024-03-28 DIAGNOSIS — I69322 Dysarthria following cerebral infarction: Secondary | ICD-10-CM

## 2024-03-28 DIAGNOSIS — Z79899 Other long term (current) drug therapy: Secondary | ICD-10-CM

## 2024-03-28 DIAGNOSIS — Z7984 Long term (current) use of oral hypoglycemic drugs: Secondary | ICD-10-CM

## 2024-03-28 DIAGNOSIS — K219 Gastro-esophageal reflux disease without esophagitis: Secondary | ICD-10-CM | POA: Diagnosis present

## 2024-03-28 DIAGNOSIS — E1165 Type 2 diabetes mellitus with hyperglycemia: Secondary | ICD-10-CM

## 2024-03-28 DIAGNOSIS — Z1152 Encounter for screening for COVID-19: Secondary | ICD-10-CM

## 2024-03-28 DIAGNOSIS — D696 Thrombocytopenia, unspecified: Secondary | ICD-10-CM | POA: Diagnosis present

## 2024-03-28 DIAGNOSIS — E860 Dehydration: Secondary | ICD-10-CM | POA: Diagnosis not present

## 2024-03-28 DIAGNOSIS — G9341 Metabolic encephalopathy: Secondary | ICD-10-CM | POA: Diagnosis not present

## 2024-03-28 DIAGNOSIS — N3000 Acute cystitis without hematuria: Secondary | ICD-10-CM | POA: Diagnosis present

## 2024-03-28 DIAGNOSIS — F0390 Unspecified dementia without behavioral disturbance: Secondary | ICD-10-CM | POA: Diagnosis present

## 2024-03-28 DIAGNOSIS — Z66 Do not resuscitate: Secondary | ICD-10-CM | POA: Diagnosis present

## 2024-03-28 DIAGNOSIS — R296 Repeated falls: Secondary | ICD-10-CM | POA: Diagnosis present

## 2024-03-28 DIAGNOSIS — I10 Essential (primary) hypertension: Secondary | ICD-10-CM | POA: Diagnosis not present

## 2024-03-28 DIAGNOSIS — Z7901 Long term (current) use of anticoagulants: Secondary | ICD-10-CM

## 2024-03-28 DIAGNOSIS — Z833 Family history of diabetes mellitus: Secondary | ICD-10-CM

## 2024-03-28 DIAGNOSIS — Z1611 Resistance to penicillins: Secondary | ICD-10-CM | POA: Diagnosis present

## 2024-03-28 DIAGNOSIS — I4819 Other persistent atrial fibrillation: Secondary | ICD-10-CM | POA: Diagnosis present

## 2024-03-28 DIAGNOSIS — Z794 Long term (current) use of insulin: Secondary | ICD-10-CM

## 2024-03-28 DIAGNOSIS — N179 Acute kidney failure, unspecified: Secondary | ICD-10-CM | POA: Diagnosis present

## 2024-03-28 DIAGNOSIS — I69354 Hemiplegia and hemiparesis following cerebral infarction affecting left non-dominant side: Secondary | ICD-10-CM

## 2024-03-28 DIAGNOSIS — Z961 Presence of intraocular lens: Secondary | ICD-10-CM | POA: Diagnosis present

## 2024-03-28 DIAGNOSIS — I4892 Unspecified atrial flutter: Secondary | ICD-10-CM | POA: Diagnosis present

## 2024-03-28 DIAGNOSIS — W19XXXA Unspecified fall, initial encounter: Secondary | ICD-10-CM | POA: Diagnosis present

## 2024-03-28 LAB — CBC WITH DIFFERENTIAL/PLATELET
Abs Immature Granulocytes: 0.02 K/uL (ref 0.00–0.07)
Basophils Absolute: 0 K/uL (ref 0.0–0.1)
Basophils Relative: 1 %
Eosinophils Absolute: 0 K/uL (ref 0.0–0.5)
Eosinophils Relative: 0 %
HCT: 45.3 % (ref 39.0–52.0)
Hemoglobin: 14.6 g/dL (ref 13.0–17.0)
Immature Granulocytes: 0 %
Lymphocytes Relative: 12 %
Lymphs Abs: 0.7 K/uL (ref 0.7–4.0)
MCH: 30.9 pg (ref 26.0–34.0)
MCHC: 32.2 g/dL (ref 30.0–36.0)
MCV: 95.8 fL (ref 80.0–100.0)
Monocytes Absolute: 0.6 K/uL (ref 0.1–1.0)
Monocytes Relative: 11 %
Neutro Abs: 4.2 K/uL (ref 1.7–7.7)
Neutrophils Relative %: 76 %
Platelets: 127 K/uL — ABNORMAL LOW (ref 150–400)
RBC: 4.73 MIL/uL (ref 4.22–5.81)
RDW: 13.4 % (ref 11.5–15.5)
WBC: 5.5 K/uL (ref 4.0–10.5)
nRBC: 0 % (ref 0.0–0.2)

## 2024-03-28 LAB — BLOOD GAS, VENOUS
Acid-Base Excess: 5.3 mmol/L — ABNORMAL HIGH (ref 0.0–2.0)
Bicarbonate: 30.5 mmol/L — ABNORMAL HIGH (ref 20.0–28.0)
Drawn by: 1517
O2 Saturation: 82.8 %
Patient temperature: 36.8
pCO2, Ven: 46 mmHg (ref 44–60)
pH, Ven: 7.43 (ref 7.25–7.43)
pO2, Ven: 50 mmHg — ABNORMAL HIGH (ref 32–45)

## 2024-03-28 LAB — COMPREHENSIVE METABOLIC PANEL WITH GFR
ALT: 13 U/L (ref 0–44)
AST: 19 U/L (ref 15–41)
Albumin: 3.8 g/dL (ref 3.5–5.0)
Alkaline Phosphatase: 91 U/L (ref 38–126)
Anion gap: 18 — ABNORMAL HIGH (ref 5–15)
BUN: 17 mg/dL (ref 8–23)
CO2: 23 mmol/L (ref 22–32)
Calcium: 9.9 mg/dL (ref 8.9–10.3)
Chloride: 102 mmol/L (ref 98–111)
Creatinine, Ser: 1.34 mg/dL — ABNORMAL HIGH (ref 0.61–1.24)
GFR, Estimated: 53 mL/min — ABNORMAL LOW
Glucose, Bld: 741 mg/dL (ref 70–99)
Potassium: 4.7 mmol/L (ref 3.5–5.1)
Sodium: 143 mmol/L (ref 135–145)
Total Bilirubin: 1.2 mg/dL (ref 0.0–1.2)
Total Protein: 6.4 g/dL — ABNORMAL LOW (ref 6.5–8.1)

## 2024-03-28 LAB — I-STAT CHEM 8, ED
BUN: 18 mg/dL (ref 8–23)
Calcium, Ion: 1.23 mmol/L (ref 1.15–1.40)
Chloride: 104 mmol/L (ref 98–111)
Creatinine, Ser: 1.3 mg/dL — ABNORMAL HIGH (ref 0.61–1.24)
Glucose, Bld: >700 mg/dL (ref 70–99)
HCT: 44 % (ref 39.0–52.0)
Hemoglobin: 15 g/dL (ref 13.0–17.0)
Potassium: 4.4 mmol/L (ref 3.5–5.1)
Sodium: 144 mmol/L (ref 135–145)
TCO2: 25 mmol/L (ref 22–32)

## 2024-03-28 LAB — BASIC METABOLIC PANEL WITH GFR
Anion gap: 17 — ABNORMAL HIGH (ref 5–15)
Anion gap: 10 (ref 5–15)
BUN: 15 mg/dL (ref 8–23)
BUN: 15 mg/dL (ref 8–23)
CO2: 21 mmol/L — ABNORMAL LOW (ref 22–32)
CO2: 30 mmol/L (ref 22–32)
Calcium: 9 mg/dL (ref 8.9–10.3)
Calcium: 9.7 mg/dL (ref 8.9–10.3)
Chloride: 111 mmol/L (ref 98–111)
Chloride: 110 mmol/L (ref 98–111)
Creatinine, Ser: 1.23 mg/dL (ref 0.61–1.24)
Creatinine, Ser: 1.22 mg/dL (ref 0.61–1.24)
GFR, Estimated: 59 mL/min — ABNORMAL LOW
GFR, Estimated: 59 mL/min — ABNORMAL LOW
Glucose, Bld: 408 mg/dL — ABNORMAL HIGH (ref 70–99)
Glucose, Bld: 205 mg/dL — ABNORMAL HIGH (ref 70–99)
Potassium: 3.3 mmol/L — ABNORMAL LOW (ref 3.5–5.1)
Potassium: 3.6 mmol/L (ref 3.5–5.1)
Sodium: 148 mmol/L — ABNORMAL HIGH (ref 135–145)
Sodium: 149 mmol/L — ABNORMAL HIGH (ref 135–145)

## 2024-03-28 LAB — CBG MONITORING, ED
Glucose-Capillary: >600 mg/dL (ref 70–99)
Glucose-Capillary: 562 mg/dL (ref 70–99)
Glucose-Capillary: 581 mg/dL (ref 70–99)
Glucose-Capillary: 407 mg/dL — ABNORMAL HIGH (ref 70–99)
Glucose-Capillary: 384 mg/dL — ABNORMAL HIGH (ref 70–99)
Glucose-Capillary: 302 mg/dL — ABNORMAL HIGH (ref 70–99)
Glucose-Capillary: 197 mg/dL — ABNORMAL HIGH (ref 70–99)

## 2024-03-28 LAB — URINALYSIS, W/ REFLEX TO CULTURE (INFECTION SUSPECTED)
Bacteria, UA: NONE SEEN
Bilirubin Urine: NEGATIVE
Glucose, UA: >=500 mg/dL — AB
Ketones, ur: 5 mg/dL — AB
Nitrite: POSITIVE — AB
Protein, ur: NEGATIVE mg/dL
Specific Gravity, Urine: 1.028 (ref 1.005–1.030)
pH: 5 (ref 5.0–8.0)

## 2024-03-28 LAB — RESP PANEL BY RT-PCR (RSV, FLU A&B, COVID)  RVPGX2
Influenza A by PCR: NEGATIVE
Influenza B by PCR: NEGATIVE
Resp Syncytial Virus by PCR: NEGATIVE
SARS Coronavirus 2 by RT PCR: NEGATIVE

## 2024-03-28 MED ORDER — POTASSIUM CHLORIDE 10 MEQ/100ML IV SOLN: 10.0000 meq | INTRAVENOUS | Status: DC

## 2024-03-28 MED ORDER — LACTATED RINGERS IV SOLN: INTRAVENOUS | Status: DC

## 2024-03-28 MED ORDER — INSULIN REGULAR(HUMAN) IN NACL 100-0.9 UT/100ML-% IV SOLN
INTRAVENOUS | Status: DC
  Administered 2024-03-29: 12 [IU]/h via INTRAVENOUS

## 2024-03-28 MED ORDER — DEXTROSE IN LACTATED RINGERS 5 % IV SOLN: INTRAVENOUS | Status: DC

## 2024-03-28 MED ORDER — LACTATED RINGERS IV SOLN: INTRAVENOUS | Status: AC

## 2024-03-28 MED ORDER — ACETAMINOPHEN 325 MG PO TABS: 650.0000 mg | ORAL_TABLET | Freq: Four times a day (QID) | ORAL | Status: DC | PRN

## 2024-03-28 MED ORDER — INSULIN REGULAR(HUMAN) IN NACL 100-0.9 UT/100ML-% IV SOLN
INTRAVENOUS | Status: DC
  Administered 2024-03-28: 11.5 [IU]/h via INTRAVENOUS
  Filled 2024-03-28: qty 100

## 2024-03-28 MED ORDER — POTASSIUM CHLORIDE 10 MEQ/100ML IV SOLN
10.0000 meq | INTRAVENOUS | Status: AC
  Administered 2024-03-28 (×2): 10 meq via INTRAVENOUS
  Filled 2024-03-28 (×2): qty 100

## 2024-03-28 MED ORDER — TERBINAFINE HCL 1 % EX CREA
TOPICAL_CREAM | Freq: Two times a day (BID) | CUTANEOUS | Status: DC
  Administered 2024-03-31 – 2024-04-01 (×2): 1 via TOPICAL
  Filled 2024-03-28: qty 15

## 2024-03-28 MED ORDER — DEXTROSE 50 % IV SOLN: 0.0000 mL | INTRAVENOUS | Status: DC | PRN

## 2024-03-28 MED ORDER — SODIUM CHLORIDE 0.9 % IV BOLUS
1000.0000 mL | Freq: Once | INTRAVENOUS | Status: AC
  Administered 2024-03-28: 1000 mL via INTRAVENOUS

## 2024-03-28 MED ORDER — IOHEXOL 300 MG/ML  SOLN
100.0000 mL | Freq: Once | INTRAMUSCULAR | Status: AC | PRN
  Administered 2024-03-28: 100 mL via INTRAVENOUS

## 2024-03-28 MED ORDER — DEXTROSE 50 % IV SOLN
0.0000 mL | INTRAVENOUS | Status: DC | PRN
  Administered 2024-03-30: 50 mL via INTRAVENOUS

## 2024-03-28 MED ORDER — LACTATED RINGERS IV BOLUS: 20.0000 mL/kg | Freq: Once | INTRAVENOUS | Status: DC

## 2024-03-28 MED ORDER — DEXTROSE IN LACTATED RINGERS 5 % IV SOLN: INTRAVENOUS | Status: AC

## 2024-03-28 MED ORDER — ONDANSETRON HCL 4 MG/2ML IJ SOLN: 4.0000 mg | Freq: Four times a day (QID) | INTRAMUSCULAR | Status: DC | PRN

## 2024-03-28 MED ORDER — ONDANSETRON HCL 4 MG PO TABS: 4.0000 mg | ORAL_TABLET | Freq: Four times a day (QID) | ORAL | Status: DC | PRN

## 2024-03-28 MED ORDER — KETOROLAC TROMETHAMINE 15 MG/ML IJ SOLN
15.0000 mg | Freq: Once | INTRAMUSCULAR | Status: AC
  Administered 2024-03-28: 15 mg via INTRAVENOUS
  Filled 2024-03-28: qty 1

## 2024-03-28 MED ORDER — ACETAMINOPHEN 650 MG RE SUPP
650.0000 mg | Freq: Four times a day (QID) | RECTAL | Status: DC | PRN
  Administered 2024-03-28: 650 mg via RECTAL
  Filled 2024-03-28: qty 1

## 2024-03-28 MED ORDER — POTASSIUM CHLORIDE 10 MEQ/100ML IV SOLN
10.0000 meq | INTRAVENOUS | Status: AC
  Administered 2024-03-28 (×3): 10 meq via INTRAVENOUS
  Filled 2024-03-28 (×3): qty 100

## 2024-03-28 MED ORDER — LACTATED RINGERS IV BOLUS
1000.0000 mL | INTRAVENOUS | Status: AC
  Administered 2024-03-28: 1000 mL via INTRAVENOUS

## 2024-03-28 NOTE — ED Notes (Signed)
 Pt cleaned after in and out cath procedure. Brief placed on pt by this nurse and nurse tech.

## 2024-03-28 NOTE — ED Notes (Addendum)
 Insulin  drip held, will restart after potassium per Dr. Manfred.

## 2024-03-28 NOTE — H&P (Addendum)
 " History and Physical    Patient: [name] FMW:[id number] DOB: 1941/03/14 DOA: 03/28/2024 DOS: the patient was seen and examined on 03/28/2024 PCP: Nichole Senior, MD  Patient coming from: ALF/ILF  Chief Complaint:  Chief Complaint  Patient presents with   Fall   HPI: [name] is a 83 y.o. male with medical history significant of hypertension, hyperlipidemia, atrial fibrillation on apixaban , CVA, type 2 diabetes melitis, GERD, OSA on CPAP who presents to the emergency via EMS from assisted living facility due to unwitnessed fall.  At bedside, patient was unable to provide history possibly due to underlying dementia.  He presented to the ED on 12/24 due to an unwitnessed fall where he sustained a skin tear to the right elbow and bleeding from mouth suspected to be due to either injury or possibly even a mass.  CT maxillofacial done showed no specific findings.  He was hyperglycemic, IV fluids and insulin  drip was given, blood sugar improved to 250 and patient was discharged back to assisted living facility.  ED course In the emergency department, he was tachycardic, but other vital signs were within normal range.  Workup in the ED showed normal CBC except thrombocytopenia.  BMP was normal except for blood glucose of 741 and creatinine of 1.34 (baseline creatinine at 0.9-1.2).  Urinalysis was positive for glycosuria and positive nitrite. CT chest, abdomen and pelvis with contrast showed no evidence of acute fracture or solid organ injury. CT head without contrast showed no acute intracranial abnormality Patient was provided with IV hydration and insulin  drip per Endo tool was started.  TRH was asked to admit patient.   Review of Systems: As mentioned in the history of present illness. All other systems reviewed and are negative. Past Medical History:  Diagnosis Date   Atrial fibrillation (HCC)    Cholelithiasis 10/2011   Per CT. No cholecystitis radiographically.   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   Shortness of breath    exertion   Sleep apnea    cpap   Stroke [facility]) 2010/2013   Right Brain stroke   Past Surgical History:  Procedure Laterality Date   CARDIOVERSION  07/12/2011   Procedure: CARDIOVERSION;  Surgeon: Vinie KYM Maxcy, MD;  Location: [facility] ENDOSCOPY;  Service: Cardiovascular;  Laterality: N/A;   CAROTID DOPPLER STUDY  01/31/2012   no significant extracranial carotid artery stenosis   CATARACT EXTRACTION W/PHACO Left 01/30/2019   Procedure: CATARACT EXTRACTION PHACO AND INTRAOCULAR LENS PLACEMENT (IOC) (CDE: 4.76);  Surgeon: Harrie Agent, MD;  Location: [facility] ORS;  Service: Ophthalmology;  Laterality: Left;   COLONOSCOPY     DOPPLER ECHOCARDIOGRAPHY  01/31/2012   EF 55-60%, moderately calcified annulus of the mitral valve, left atrium demonstrated mild-moderately dilated   KNEE SURGERY     TEE WITHOUT CARDIOVERSION  07/12/2011   Procedure: TRANSESOPHAGEAL ECHOCARDIOGRAM (TEE);  Surgeon: Vinie KYM Maxcy, MD;  Location: [facility] ENDOSCOPY;  Service: Cardiovascular;  Laterality: N/A;  to be done at 1330   TONSILLECTOMY     Social History:  reports that he has never smoked. He has never used smokeless tobacco. He reports current alcohol  use of about 2.0 standard drinks of alcohol  per week. He reports  that he does not use drugs.  Allergies[1]  Family History  Problem Relation Age of Onset   Diabetes Mother    Diabetes Son    Pancreatic cancer Other        GRANDFATHER   Colon cancer Other        INTESTINAL, GRANDMOTHER    Prior to Admission medications  Medication Sig Start Date End Date Taking? Authorizing Provider  apixaban  (ELIQUIS ) 5 MG TABS tablet Take 1 tablet (5 mg total) by mouth 2 (two) times  daily. 01/14/24   Landy Barnie RAMAN, NP  Choline Fenofibrate  (FENOFIBRIC ACID ) 135 MG CPDR Take 1 capsule (135 mg total) by mouth daily. 01/14/24   Landy Barnie RAMAN, NP  cyanocobalamin  (VITAMIN B12) 1000 MCG tablet Take 1,000 mcg by mouth daily.    [provider]  EMBECTA AUTOSHIELD DUO 30G X 5 MM MISC  01/28/24   [provider]  escitalopram  (LEXAPRO ) 10 MG tablet Take 1 tablet (10 mg total) by mouth daily. 01/14/24   Landy Barnie RAMAN, NP  folic acid  (FOLVITE ) 1 MG tablet Take 1 tablet (1 mg total) by mouth daily. 01/14/24   Landy Barnie RAMAN, NP  hydrocortisone  (ANUSOL -HC) 2.5 % rectal cream Apply 1 Application topically 4 (four) times daily as needed for hemorrhoids or anal itching. 11/30/23   Johnson, Clanford L, MD  hydrOXYzine  (ATARAX ) 10 MG tablet Take 10 mg by mouth at bedtime. 02/18/24   [provider]  LANTUS  SOLOSTAR 100 UNIT/ML Solostar Pen Inject 20 Units into the skin in the morning. 02/12/24   [provider]  linagliptin  (TRADJENTA ) 5 MG TABS tablet Take 1 tablet (5 mg total) by mouth daily. 01/14/24   Landy Barnie RAMAN, NP  magnesium  oxide (MAG-OX) 400 (240 Mg) MG tablet Take 1 tablet (400 mg total) by mouth daily. 01/14/24   Landy Barnie RAMAN, NP  MELATONIN MAXIMUM STRENGTH 5 MG TABS Take 5 mg by mouth at bedtime as needed. 02/18/24   [provider]  metoprolol  tartrate (LOPRESSOR ) 25 MG tablet Take 0.5 tablets (12.5 mg total) by mouth 2 (two) times daily. 01/14/24   Landy Barnie RAMAN, NP  mirabegron  ER (MYRBETRIQ ) 50 MG TB24 tablet Take 1 tablet (50 mg total) by mouth daily. 01/14/24   Landy Barnie RAMAN, NP  rosuvastatin  (CRESTOR ) 20 MG tablet Take 1 tablet (20 mg total) by mouth daily. 01/14/24   Landy Barnie RAMAN, NP    Physical Exam: Vitals:   03/28/24 1746 03/28/24 1800 03/28/24 1900 03/28/24 1915  BP: (!) 150/99 112/67 (!) 152/117 134/88  Pulse: (!) 119 (!) 119 (!) 112 (!) 113  Resp: 13 17 20  (!) 23  Temp:  99.2 F (37.3 C)     TempSrc:  Oral    SpO2: 96% 95% 95% 94%  Weight:      Height:       General: Elderly male.  Somnolent, but easily arousable and quickly goes back to sleep. Not in any acute distress.  HEENT: NCAT.  PERRL. Sclerae anicteric.  Dry mucosal membranes. Neck: Neck supple without lymphadenopathy. No carotid bruits. No masses palpated.  Cardiovascular: Tachycardia.  Regular rate with normal S1-S2 sounds. No murmurs, rubs or gallops auscultated. No JVD.  Respiratory: Clear breath sounds.  No accessory muscle use. Abdomen: Soft, nontender, nondistended. Active bowel sounds. No masses or hepatosplenomegaly  Skin: No rashes, lesions, or ulcerations.  Dry, warm to touch. Musculoskeletal:  2+ dorsalis pedis and radial pulses. Good ROM.  No contractures  Psychiatric: Intact judgment  and insight.  Mood appropriate to current condition. Neurologic: No focal neurological deficits.  Opens eyes to voice but does not follow commands   Assessment and Plan: Hyperosmolar hyperglycemic state Hyperglycemia secondary to poorly controlled type 2 diabetes mellitus Continue insulin  drip, IV LR with IV potassium per DKA protocol Transition IV LR to D5 LR when serum glucose reaches 250mg /dL Continue serial BMP and VBG Continue to monitor for anion gap closure prior to transitioning patient to subcu insulin  Continue NPO  Dehydration Continue IV hydration  Reported acute metabolic encephalopathy Baseline mental status unknown, but was reported to have altered mental status per EDP Patient opens eyes to voice, but does not follow commands Continue fall precaution, aspiration precaution  Recurrent falls Continue fall precaution Continue PT/OT eval and treat  Tinea cruris Continue terbinafine   Persistent atrial fibrillation Continue telemetry Continue apixaban , metoprolol    Essential hypertension (controlled) Continue metoprolol     Mixed hyperlipidemia History of CVA Continue statin Patient's not on  aspirin  due to Eliquis  use   Advance Care Planning: DNR  Consults: None  Family Communication: None at bedside  Severity of Illness: The appropriate patient status for this patient is OBSERVATION. Observation status is judged to be reasonable and necessary in order to provide the required intensity of service to ensure the patient's safety. The patient's presenting symptoms, physical exam findings, and initial radiographic and laboratory data in the context of their medical condition is felt to place them at decreased risk for further clinical deterioration. Furthermore, it is anticipated that the patient will be medically stable for discharge from the hospital within 2 midnights of admission.   Author: Nechelle Petrizzo, DO 03/28/2024 8:03 PM  For on call review [email or web address].      [1] No Known Allergies  "

## 2024-03-28 NOTE — ED Notes (Signed)
 Spome with [facility] and gave them update on pt and plan of care

## 2024-03-28 NOTE — ED Notes (Addendum)
 Provider notified of endotool message about potassium being low (3.3). Per endotool, insulin  drip increased to 9.5U/hr. CBG at this time 302

## 2024-03-28 NOTE — ED Notes (Signed)
 Provider notified of glucose on iStat being >700.

## 2024-03-28 NOTE — ED Provider Notes (Signed)
 " [facility] AT [facility] Provider Note  CSN: [id number] Arrival date & time: 03/28/24 1521  Chief Complaint(s) Fall  HPI [name] is a 83 y.o. male history of atrial fibrillation on Eliquis , prior stroke, hypertension, lipidemia, dementia presenting with unwitnessed fall.  Patient apparently had a fall at his assisted living facility.  Details are unknown.  Had recent fall as well.  Patient seemed to wince when stood up with paramedics.  History is limited due to history of dementia.   Past Medical History Past Medical History:  Diagnosis Date   Atrial fibrillation (HCC)    Cholelithiasis 10/2011   Per CT. No cholecystitis radiographically.   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   Shortness of breath    exertion   Sleep apnea    cpap   Stroke [facility]) 2010/2013   Right Brain stroke   Patient Active Problem List   Diagnosis Date Noted   Hyperosmolar hyperglycemic state (HHS) (HCC) 03/28/2024   Syncope 02/19/2024   Traumatic rhabdomyolysis 12/19/2023   AKI (acute kidney injury) 12/17/2023   Orthostatic hypotension 12/17/2023   DM (diabetes mellitus), secondary, with peripheral vascular complications (HCC) 12/04/2023   Hyperlipidemia associated with type 2 diabetes mellitus (HCC) 12/03/2023   Hypertension associated with type 2 diabetes mellitus (HCC) 12/03/2023   Major depression, recurrent, chronic 12/03/2023   OAB (overactive bladder) 12/03/2023   Aortic atherosclerosis 12/03/2023   Major neurocognitive disorder (HCC) 12/03/2023   Aphasia 11/29/2023   TIA (transient ischemic attack) 11/29/2023   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 Authorizing Provider  apixaban  (ELIQUIS ) 5 MG TABS tablet Take 1 tablet (5 mg total) by mouth 2 (two) times daily. 01/14/24   Landy Barnie RAMAN, NP  Choline Fenofibrate  (FENOFIBRIC ACID ) 135 MG CPDR Take 1 capsule (135 mg total) by mouth daily. 01/14/24   Landy Barnie RAMAN, NP  cyanocobalamin  (VITAMIN B12) 1000 MCG tablet Take 1,000 mcg by mouth daily.    [provider]  EMBECTA AUTOSHIELD DUO 30G X 5 MM MISC  01/28/24   [provider]  escitalopram  (LEXAPRO ) 10 MG tablet Take 1 tablet (10 mg total) by mouth daily. 01/14/24   Landy Barnie RAMAN, NP  folic acid  (FOLVITE ) 1 MG tablet Take 1 tablet (1 mg total) by mouth daily. 01/14/24   Landy Barnie RAMAN, NP  hydrocortisone  (ANUSOL -HC) 2.5 % rectal cream Apply 1 Application topically 4 (four) times daily as needed for hemorrhoids or anal itching. 11/30/23   Johnson, Clanford L, MD  hydrOXYzine  (ATARAX ) 10 MG tablet Take 10 mg by mouth at bedtime. 02/18/24   [provider]  LANTUS  SOLOSTAR 100 UNIT/ML Solostar Pen Inject 20 Units into the skin in the morning. 02/12/24  [provider]  linagliptin  (TRADJENTA ) 5 MG TABS tablet Take 1 tablet (5 mg total) by mouth daily. 01/14/24   Landy Barnie RAMAN, NP  magnesium  oxide (MAG-OX) 400 (240 Mg) MG tablet Take 1 tablet (400 mg total) by mouth daily. 01/14/24   Landy Barnie RAMAN, NP  MELATONIN MAXIMUM STRENGTH 5 MG TABS Take 5 mg by mouth at bedtime as needed. 02/18/24   [provider]  metoprolol  tartrate  (LOPRESSOR ) 25 MG tablet Take 0.5 tablets (12.5 mg total) by mouth 2 (two) times daily. 01/14/24   Landy Barnie RAMAN, NP  mirabegron  ER (MYRBETRIQ ) 50 MG TB24 tablet Take 1 tablet (50 mg total) by mouth daily. 01/14/24   Landy Barnie RAMAN, NP  rosuvastatin  (CRESTOR ) 20 MG tablet Take 1 tablet (20 mg total) by mouth daily. 01/14/24   Landy Barnie RAMAN, NP                                                                                                                                    Past Surgical History Past Surgical History:  Procedure Laterality Date   CARDIOVERSION  07/12/2011   Procedure: CARDIOVERSION;  Surgeon: Vinie KYM Maxcy, MD;  Location: [facility] ENDOSCOPY;  Service: Cardiovascular;  Laterality: N/A;   CAROTID DOPPLER STUDY  01/31/2012   no significant extracranial carotid artery stenosis   CATARACT EXTRACTION W/PHACO Left 01/30/2019   Procedure: CATARACT EXTRACTION PHACO AND INTRAOCULAR LENS PLACEMENT (IOC) (CDE: 4.76);  Surgeon: Harrie Agent, MD;  Location: [facility] ORS;  Service: Ophthalmology;  Laterality: Left;   COLONOSCOPY     DOPPLER ECHOCARDIOGRAPHY  01/31/2012   EF 55-60%, moderately calcified annulus of the mitral valve, left atrium demonstrated mild-moderately dilated   KNEE SURGERY     TEE WITHOUT CARDIOVERSION  07/12/2011   Procedure: TRANSESOPHAGEAL ECHOCARDIOGRAM (TEE);  Surgeon: Vinie KYM Maxcy, MD;  Location: [facility] ENDOSCOPY;  Service: Cardiovascular;  Laterality: N/A;  to be done at 1330   TONSILLECTOMY     Family History Family History  Problem Relation Age of Onset   Diabetes Mother    Diabetes Son    Pancreatic cancer Other        GRANDFATHER   Colon cancer Other        INTESTINAL, GRANDMOTHER    Social History Social History[1] Allergies Patient has no known allergies.  Review of Systems Review of Systems  All other systems reviewed and are negative.   Physical Exam Vital Signs  I have reviewed the triage vital signs BP (!) 173/102   Pulse 98   Temp  99.2 F (37.3 C) (Oral)   Resp (!) 21   Ht 5' 8 (1.727 m)   Wt 76 kg   SpO2 100%   BMI 25.48 kg/m  Physical Exam Vitals and nursing note reviewed.  Constitutional:      General: He is not in acute distress.    Appearance: Normal appearance.  HENT:  Mouth/Throat:     Mouth: Mucous membranes are dry.  Eyes:     Conjunctiva/sclera: Conjunctivae normal.  Cardiovascular:     Rate and Rhythm: Regular rhythm. Tachycardia present.  Pulmonary:     Effort: Pulmonary effort is normal. No respiratory distress.     Breath sounds: Normal breath sounds.  Abdominal:     General: Abdomen is flat.     Palpations: Abdomen is soft.     Tenderness: There is no abdominal tenderness.  Musculoskeletal:     Right lower leg: No edema.     Left lower leg: No edema.     Comments: No midline C, T, L-spine tenderness.  Chest wall stable, no crepitus.  Upper extremities without signs of acute trauma or deformity.  Full range of motion of the bilateral hips, knees, ankles but tenderness present around the left femur/pelvis region  Skin:    General: Skin is warm and dry.     Capillary Refill: Capillary refill takes less than 2 seconds.  Neurological:     Mental Status: He is alert. Mental status is at baseline.     Comments: Opens eyes to voice, does not follow commands  Psychiatric:        Mood and Affect: Mood normal.        Behavior: Behavior normal.     ED Results and Treatments Labs (all labs ordered are listed, but only abnormal results are displayed) Labs Reviewed  COMPREHENSIVE METABOLIC PANEL WITH GFR - Abnormal; Notable for the following components:      Result Value   Glucose, Bld 741 (*)    Creatinine, Ser 1.34 (*)    Total Protein 6.4 (*)    GFR, Estimated 53 (*)    Anion gap 18 (*)    All other components within normal limits  CBC WITH DIFFERENTIAL/PLATELET - Abnormal; Notable for the following components:   Platelets 127 (*)    All other components within normal limits   URINALYSIS, W/ REFLEX TO CULTURE (INFECTION SUSPECTED) - Abnormal; Notable for the following components:   Glucose, UA >=500 (*)    Hgb urine dipstick SMALL (*)    Ketones, ur 5 (*)    Nitrite POSITIVE (*)    Leukocytes,Ua TRACE (*)    All other components within normal limits  BLOOD GAS, VENOUS - Abnormal; Notable for the following components:   pO2, Ven 50 (*)    Bicarbonate 30.5 (*)    Acid-Base Excess 5.3 (*)    All other components within normal limits  BASIC METABOLIC PANEL WITH GFR - Abnormal; Notable for the following components:   Sodium 148 (*)    Potassium 3.3 (*)    CO2 21 (*)    Glucose, Bld 408 (*)    GFR, Estimated 59 (*)    Anion gap 17 (*)    All other components within normal limits  I-STAT CHEM 8, ED - Abnormal; Notable for the following components:   Creatinine, Ser 1.30 (*)    Glucose, Bld >700 (*)    All other components within normal limits  CBG MONITORING, ED - Abnormal; Notable for the following components:   Glucose-Capillary >600 (*)    All other components within normal limits  CBG MONITORING, ED - Abnormal; Notable for the following components:   Glucose-Capillary 581 (*)    All other components within normal limits  CBG MONITORING, ED - Abnormal; Notable for the following components:   Glucose-Capillary 562 (*)    All other components within  normal limits  CBG MONITORING, ED - Abnormal; Notable for the following components:   Glucose-Capillary 407 (*)    All other components within normal limits  RESP PANEL BY RT-PCR (RSV, FLU A&B, COVID)  RVPGX2  URINE CULTURE  OSMOLALITY                                                                                                                          Radiology CT Head Wo Contrast Result Date: 03/28/2024 EXAM: CT HEAD WITHOUT CONTRAST 03/28/2024 05:20:45 PM TECHNIQUE: CT of the head was performed without the administration of intravenous contrast. Automated exposure control, iterative reconstruction,  and/or weight based adjustment of the mA/kV was utilized to reduce the radiation dose to as low as reasonably achievable. COMPARISON: 03/26/2024 CLINICAL HISTORY: Head trauma, minor (Age >= 65y) FINDINGS: BRAIN AND VENTRICLES: No acute hemorrhage. No evidence of acute infarct. Proportional prominence of ventricles and sulci, consistent with diffuse cerebral parenchymal volume loss. Confluent periventricular and subcortical white matter hypoattenuation, consistent with advanced chronic ischemic microvascular disease. Multifocal encephalomalacia in left frontal lobe and the left parieto-occipital lobes. Remote lacunar infarcts in the right basal ganglia and right thalamus. No extra-axial collection. No mass effect or midline shift. ORBITS: Left lens replacement. SINUSES: No acute abnormality. SOFT TISSUES AND SKULL: No acute soft tissue abnormality. No skull fracture. VASCULATURE: Calcified atherosclerotic plaque in cavernous/supraclinoid ICA and intradural vertebral arteries. IMPRESSION: 1. No acute intracranial abnormality. 2. Extensive chronic microvascular ischemic changes and remote infarcts are similar to prior. Electronically signed by: Donnice Mania MD 03/28/2024 06:52 PM EST RP Workstation: [id number]   CT Cervical Spine Wo Contrast Result Date: 03/28/2024 EXAM: CT CERVICAL SPINE WITHOUT CONTRAST 03/28/2024 05:20:45 PM TECHNIQUE: CT of the cervical spine was performed without the administration of intravenous contrast. Multiplanar reformatted images are provided for review. Automated exposure control, iterative reconstruction, and/or weight based adjustment of the mA/kV was utilized to reduce the radiation dose to as low as reasonably achievable. COMPARISON: 03/26/2024 CLINICAL HISTORY: Neck trauma (Age >= 65y) FINDINGS: BONES AND ALIGNMENT: Similar appearance of prominence confluent enthesopathy from C2 through T4. There is disruption of anterior osteophytes at the C6-C7 level which is unchanged from  prior. Redemonstrated nondisplaced transverse fracture through the bridging osteophytes anteriorly at C5-C6. There is no evidence of traumatic malalignment. DEGENERATIVE CHANGES: Degenerative endplate osteophytes and disc space narrowing at multiple levels. Disc osteophyte complexes at multiple levels. Spinal canal stenosis is most pronounced at C5-C6 where a right eccentric disc osteophyte complex results in cord compression. There is facet arthrosis and uncovertebral hypertrophy at multiple levels. Foraminal stenosis throughout the cervical spine similar to prior, most pronounced on the left at C2-C3 and on the right at C5-C6. SOFT TISSUES: No prevertebral soft tissue swelling. Alignment of the atherosclerotic plaque at the carotid bifurcations. IMPRESSION: 1. Redemonstrated nondisplaced transverse fracture through the bridging osteophytes anteriorly at C5-C6. 2. No evidence of acute traumatic malalignment. 3. Degenerative changes are similar to prior. Spinal canal stenosis most  pronounced at C5-C6 with cord compression. Electronically signed by: Donnice Mania MD 03/28/2024 06:41 PM EST RP Workstation: [id number]   CT CHEST ABDOMEN PELVIS W CONTRAST Result Date: 03/28/2024 CLINICAL DATA:  Polytrauma, blunt.  Unwitnessed fall. EXAM: CT CHEST, ABDOMEN, AND PELVIS WITH CONTRAST TECHNIQUE: Multidetector CT imaging of the chest, abdomen and pelvis was performed following the standard protocol during bolus administration of intravenous contrast. RADIATION DOSE REDUCTION: This exam was performed according to the departmental dose-optimization program which includes automated exposure control, adjustment of the mA and/or kV according to patient size and/or use of iterative reconstruction technique. CONTRAST:  OMNIPAQUE  IOHEXOL  300 MG/ML  SOLN COMPARISON:  12/17/2023. FINDINGS: CT CHEST FINDINGS Cardiovascular: Heart is enlarged and there is a small pericardial effusion. Three-vessel coronary artery calcifications  are noted. 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 Pulmonary trunk is distended suggesting underlying pulmonary artery hypertension. Mediastinum/Nodes: No mediastinal, hilar, or axillary lymphadenopathy. The thyroid  gland, trachea, and esophagus are within normal limits. Lungs/Pleura: Atelectasis is present bilaterally. No effusion or pneumothorax is seen. Musculoskeletal: Degenerative changes are present in the thoracic spine. No acute fracture is seen. CT ABDOMEN PELVIS FINDINGS Hepatobiliary: No focal liver abnormality is seen. Stones are present within the gallbladder. No biliary ductal dilatation. Pancreas: Unremarkable. No pancreatic ductal dilatation or surrounding inflammatory changes. Spleen: Normal in size without focal abnormality. Adrenals/Urinary Tract: The adrenal glands are within normal limits. The kidneys enhance symmetrically. Scattered hypodensities are present in the kidneys bilaterally, the largest on the left is cystic in attenuation. No renal calculus or hydronephrosis bilaterally. The bladder is unremarkable. Stomach/Bowel: The stomach is within normal limits. No bowel obstruction, free air, or pneumatosis is seen. Appendix appears normal. Vascular/Lymphatic: Aortic atherosclerosis. No enlarged abdominal or pelvic lymph nodes. Reproductive: Prostate gland is enlarged. Other: No abdominopelvic ascites. A fat containing inguinal hernias present on the left. Musculoskeletal: Degenerative changes are noted in the lumbar spine. Pars defects are present at L5 bilaterally with mild anterolisthesis at L5-S1. There is kyphosis of the upper lumbar spine. No acute fracture is seen. IMPRESSION: 1. No evidence of acute fracture or solid organ injury. 2. Cholelithiasis. 3. Enlarged prostate gland. 4. Coronary artery calcifications. 5. Aortic atherosclerosis. Electronically Signed   By: Leita Birmingham M.D.   On: 03/28/2024 18:41    DG FEMUR MIN 2 VIEWS LEFT Result Date: 03/28/2024 CLINICAL DATA:  Unwitnessed fall. EXAM: LEFT FEMUR 2 VIEWS COMPARISON:  None Available. FINDINGS: There is no evidence of fracture or other focal bone lesions. Knee arthroplasty is intact. Hip alignment is maintained with moderate degenerative change. Vascular calcifications. IMPRESSION: No acute fracture of the left femur. Electronically Signed   By: Andrea Gasman M.D.   On: 03/28/2024 17:47    Pertinent labs & imaging results that were available during my care of the patient were reviewed by me and considered in my medical decision making (see MDM for details).  Medications Ordered in ED Medications  insulin  regular, human (MYXREDLIN ) 100 units/ 100 mL infusion (9 Units/hr Intravenous Rate/Dose Change 03/28/24 1935)  lactated ringers  infusion ( Intravenous New Bag/Given 03/28/24 1741)  dextrose  5 % in lactated ringers  infusion (has no administration in time range)  dextrose  50 % solution 0-50 mL (has no administration in time range)  sodium chloride  0.9 % bolus 1,000 mL (0 mLs Intravenous Stopped 03/28/24 1723)  iohexol  (OMNIPAQUE ) 300 MG/ML solution 100 mL (100 mLs Intravenous Contrast Given 03/28/24 1707)  lactated ringers  bolus  1,000 mL (0 mLs Intravenous Stopped 03/28/24 1903)  potassium chloride  10 mEq in 100 mL IVPB (0 mEq Intravenous Stopped 03/28/24 2006)                                                                                                                                     Procedures .Critical Care  Performed by: Francesca Elsie CROME, MD Authorized by: Francesca Elsie CROME, MD   Critical care provider statement:    Critical care time (minutes):  30   Critical care was necessary to treat or prevent imminent or life-threatening deterioration of the following conditions:  Endocrine crisis   Critical care was time spent personally by me on the following activities:  Development of treatment plan with patient or  surrogate, discussions with consultants, evaluation of patient's response to treatment, examination of patient, ordering and review of laboratory studies, ordering and review of radiographic studies, ordering and performing treatments and interventions, pulse oximetry, re-evaluation of patient's condition and review of old charts   Care discussed with: admitting provider     (including critical care time)  Medical Decision Making / ED Course   MDM:  83 year old presenting with fall.  History is extremely limited due to patient's dementia.  Noted to be tachycardic.  Examination with some tenderness around the left hip/femur, also appears clinically dehydrated with dry mucous membranes.  Will obtain CT head and cervical spine as patient is on Eliquis  and unknown whether he hit his head.  Lower concern for other acute dangerous injury but given tachycardia will obtain further CT scans of the chest and abdomen which also include pelvis and hip.  Will obtain x-ray of the left femur given some tenderness around this region.  Will check labs given tachycardia as well as EKG.  Will give IV fluids as patient appears dehydrated.  Clinical Course as of 03/28/24 2028  Sat Mar 28, 2024  2026 Workup overall concerning for possible HHS.  Attempted to call his assisted living facility to see who gives the patient insulin  but nobody answered.  Given that he was just discharged with hyperglycemia has returned I do not think it would be safe to discharge him.  He seems much more alert is able to follow commands and answer questions at this point.  His imaging is negative for any signs of trauma.  Discussed with Dr. Manfred who will admit patient. [WS]    Clinical Course User Index [WS] Francesca Elsie CROME, MD     Additional history obtained: -Additional history obtained from ems -External records from outside source obtained and reviewed including: Chart review including previous notes, labs, imaging,  consultation notes including prior er visit for similar    Lab Tests: -I ordered, reviewed, and interpreted labs.   The pertinent results include:   Labs Reviewed  COMPREHENSIVE METABOLIC PANEL WITH GFR - Abnormal; Notable for the following components:      Result Value  Glucose, Bld 741 (*)    Creatinine, Ser 1.34 (*)    Total Protein 6.4 (*)    GFR, Estimated 53 (*)    Anion gap 18 (*)    All other components within normal limits  CBC WITH DIFFERENTIAL/PLATELET - Abnormal; Notable for the following components:   Platelets 127 (*)    All other components within normal limits  URINALYSIS, W/ REFLEX TO CULTURE (INFECTION SUSPECTED) - Abnormal; Notable for the following components:   Glucose, UA >=500 (*)    Hgb urine dipstick SMALL (*)    Ketones, ur 5 (*)    Nitrite POSITIVE (*)    Leukocytes,Ua TRACE (*)    All other components within normal limits  BLOOD GAS, VENOUS - Abnormal; Notable for the following components:   pO2, Ven 50 (*)    Bicarbonate 30.5 (*)    Acid-Base Excess 5.3 (*)    All other components within normal limits  BASIC METABOLIC PANEL WITH GFR - Abnormal; Notable for the following components:   Sodium 148 (*)    Potassium 3.3 (*)    CO2 21 (*)    Glucose, Bld 408 (*)    GFR, Estimated 59 (*)    Anion gap 17 (*)    All other components within normal limits  I-STAT CHEM 8, ED - Abnormal; Notable for the following components:   Creatinine, Ser 1.30 (*)    Glucose, Bld >700 (*)    All other components within normal limits  CBG MONITORING, ED - Abnormal; Notable for the following components:   Glucose-Capillary >600 (*)    All other components within normal limits  CBG MONITORING, ED - Abnormal; Notable for the following components:   Glucose-Capillary 581 (*)    All other components within normal limits  CBG MONITORING, ED - Abnormal; Notable for the following components:   Glucose-Capillary 562 (*)    All other components within normal limits  CBG  MONITORING, ED - Abnormal; Notable for the following components:   Glucose-Capillary 407 (*)    All other components within normal limits  RESP PANEL BY RT-PCR (RSV, FLU A&B, COVID)  RVPGX2  URINE CULTURE  OSMOLALITY    Notable for findings of possible HHS  EKG   EKG Interpretation Date/Time:  Saturday March 28 2024 16:27:43 EST Ventricular Rate:  109 PR Interval:    QRS Duration:  134 QT Interval:  380 QTC Calculation: 512 R Axis:   -56  Text Interpretation: Atrial fibrillation Paired ventricular premature complexes Nonspecific IVCD with LAD Probable anteroseptal infarct, old Confirmed by Francesca Fallow ([id number]) on 03/28/2024 4:41:57 PM         Imaging Studies ordered: I ordered imaging studies including CT scans  On my interpretation imaging demonstrates no acute process I independently visualized and interpreted imaging. I agree with the radiologist interpretation   Medicines ordered and prescription drug management: Meds ordered this encounter  Medications   sodium chloride  0.9 % bolus 1,000 mL   iohexol  (OMNIPAQUE ) 300 MG/ML solution 100 mL   insulin  regular, human (MYXREDLIN ) 100 units/ 100 mL infusion    EndoTool Goal Range::   140-180    Type of Diabetes:   Type 2    Mode of Therapy:   ENDOX2 for HHS    Start Method:   EndoTool to calculate   lactated ringers  infusion   dextrose  5 % in lactated ringers  infusion   dextrose  50 % solution 0-50 mL   lactated ringers  bolus 1,000 mL  potassium chloride  10 mEq in 100 mL IVPB    -I have reviewed the patients home medicines and have made adjustments as needed   Consultations Obtained: I requested consultation with the hospitalist,  and discussed lab and imaging findings as well as pertinent plan - they recommend: admission   Cardiac Monitoring: The patient was maintained on a cardiac monitor.  I personally viewed and interpreted the cardiac monitored which showed an underlying rhythm of: afib    Reevaluation: After the interventions noted above, I reevaluated the patient and found that their symptoms have improved  Co morbidities that complicate the patient evaluation  Past Medical History:  Diagnosis Date   Atrial fibrillation (HCC)    Cholelithiasis 10/2011   Per CT. No cholecystitis radiographically.   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   Shortness of breath    exertion   Sleep apnea    cpap   Stroke [facility]) 2010/2013   Right Brain stroke      Dispostion: Disposition decision including need for hospitalization was considered, and patient admitted to the hospital.    Final Clinical Impression(s) / ED Diagnoses Final diagnoses:  Acute metabolic encephalopathy  Hyperosmolar hyperglycemic state (HHS) (HCC)     This chart was dictated using voice recognition software.  Despite best efforts to proofread,  errors can occur which can change the documentation meaning.      [1]  Social History Tobacco Use   Smoking status: Never   Smokeless tobacco: Never  Vaping Use   Vaping status: Never Used  Substance Use Topics   Alcohol  use: Yes    Alcohol /week: 2.0 standard drinks of alcohol     Types: 2 Glasses of wine per week    Comment: occasionally    Drug use: No     Francesca Elsie CROME, MD 03/28/24 2028  "

## 2024-03-28 NOTE — ED Triage Notes (Addendum)
 Pt arrived via [facility] from the [facility] [place] after pt fell unknown witnessed. Pt with no LOC. Pt not c/o pain. Pt did wince when stood up with EMS. Hx of dementia w/ spurts of shouting. Pt is in midst of getting into skilled nursing facility. EMS stated pt felt warm to touch. HR in triage 115 w/ temp of 99.1 axillary.

## 2024-03-28 NOTE — ED Notes (Signed)
 Patient transported to CT

## 2024-03-28 NOTE — ED Notes (Signed)
 Endotool CBG due at 1832. This nurse was busy with situation in another room. Asked another nurse for assistance to recheck sugar and monitor insulin  drip. This nurse was not alerted that it did not get done, per the sugar check at 1902. Insulin  drip changed from 12U/hr to 8.5U/hr.

## 2024-03-28 NOTE — ED Notes (Signed)
 Insulin  infusion order was put in. Spoke with Adefeso and continue to hold until potassium runs are complete.

## 2024-03-29 DIAGNOSIS — W19XXXA Unspecified fall, initial encounter: Secondary | ICD-10-CM | POA: Diagnosis present

## 2024-03-29 DIAGNOSIS — Z794 Long term (current) use of insulin: Secondary | ICD-10-CM

## 2024-03-29 DIAGNOSIS — E1165 Type 2 diabetes mellitus with hyperglycemia: Secondary | ICD-10-CM | POA: Diagnosis not present

## 2024-03-29 DIAGNOSIS — I69354 Hemiplegia and hemiparesis following cerebral infarction affecting left non-dominant side: Secondary | ICD-10-CM | POA: Diagnosis not present

## 2024-03-29 DIAGNOSIS — G9341 Metabolic encephalopathy: Secondary | ICD-10-CM | POA: Diagnosis present

## 2024-03-29 DIAGNOSIS — I69322 Dysarthria following cerebral infarction: Secondary | ICD-10-CM | POA: Diagnosis not present

## 2024-03-29 DIAGNOSIS — N179 Acute kidney failure, unspecified: Secondary | ICD-10-CM | POA: Diagnosis present

## 2024-03-29 DIAGNOSIS — Z1152 Encounter for screening for COVID-19: Secondary | ICD-10-CM | POA: Diagnosis not present

## 2024-03-29 DIAGNOSIS — I4892 Unspecified atrial flutter: Secondary | ICD-10-CM | POA: Diagnosis present

## 2024-03-29 DIAGNOSIS — Z66 Do not resuscitate: Secondary | ICD-10-CM | POA: Diagnosis present

## 2024-03-29 DIAGNOSIS — D696 Thrombocytopenia, unspecified: Secondary | ICD-10-CM | POA: Diagnosis present

## 2024-03-29 DIAGNOSIS — K219 Gastro-esophageal reflux disease without esophagitis: Secondary | ICD-10-CM | POA: Diagnosis present

## 2024-03-29 DIAGNOSIS — B356 Tinea cruris: Secondary | ICD-10-CM | POA: Diagnosis present

## 2024-03-29 DIAGNOSIS — R6510 Systemic inflammatory response syndrome (SIRS) of non-infectious origin without acute organ dysfunction: Secondary | ICD-10-CM

## 2024-03-29 DIAGNOSIS — N3000 Acute cystitis without hematuria: Secondary | ICD-10-CM | POA: Diagnosis present

## 2024-03-29 DIAGNOSIS — I4819 Other persistent atrial fibrillation: Secondary | ICD-10-CM | POA: Diagnosis present

## 2024-03-29 DIAGNOSIS — E1100 Type 2 diabetes mellitus with hyperosmolarity without nonketotic hyperglycemic-hyperosmolar coma (NKHHC): Secondary | ICD-10-CM | POA: Diagnosis present

## 2024-03-29 DIAGNOSIS — E860 Dehydration: Secondary | ICD-10-CM | POA: Diagnosis present

## 2024-03-29 DIAGNOSIS — Z1611 Resistance to penicillins: Secondary | ICD-10-CM | POA: Diagnosis present

## 2024-03-29 DIAGNOSIS — E782 Mixed hyperlipidemia: Secondary | ICD-10-CM | POA: Diagnosis present

## 2024-03-29 DIAGNOSIS — A4159 Other Gram-negative sepsis: Secondary | ICD-10-CM | POA: Diagnosis present

## 2024-03-29 DIAGNOSIS — R296 Repeated falls: Secondary | ICD-10-CM | POA: Diagnosis present

## 2024-03-29 DIAGNOSIS — A419 Sepsis, unspecified organism: Secondary | ICD-10-CM | POA: Diagnosis not present

## 2024-03-29 DIAGNOSIS — F0390 Unspecified dementia without behavioral disturbance: Secondary | ICD-10-CM | POA: Diagnosis present

## 2024-03-29 DIAGNOSIS — I10 Essential (primary) hypertension: Secondary | ICD-10-CM | POA: Diagnosis present

## 2024-03-29 DIAGNOSIS — Z7901 Long term (current) use of anticoagulants: Secondary | ICD-10-CM | POA: Diagnosis not present

## 2024-03-29 DIAGNOSIS — Z7984 Long term (current) use of oral hypoglycemic drugs: Secondary | ICD-10-CM | POA: Diagnosis not present

## 2024-03-29 DIAGNOSIS — A498 Other bacterial infections of unspecified site: Secondary | ICD-10-CM | POA: Diagnosis not present

## 2024-03-29 DIAGNOSIS — B961 Klebsiella pneumoniae [K. pneumoniae] as the cause of diseases classified elsewhere: Secondary | ICD-10-CM | POA: Diagnosis present

## 2024-03-29 LAB — BLOOD GAS, VENOUS
Acid-Base Excess: 7.7 mmol/L — ABNORMAL HIGH (ref 0.0–2.0)
Bicarbonate: 31 mmol/L — ABNORMAL HIGH (ref 20.0–28.0)
Drawn by: 1517
O2 Saturation: 77.7 %
Patient temperature: 38.6
pCO2, Ven: 41 mmHg — ABNORMAL LOW (ref 44–60)
pH, Ven: 7.5 — ABNORMAL HIGH (ref 7.25–7.43)
pO2, Ven: 49 mmHg — ABNORMAL HIGH (ref 32–45)

## 2024-03-29 LAB — OSMOLALITY: Osmolality: 342 mosm/kg (ref 275–295)

## 2024-03-29 LAB — CBC
HCT: 42.8 % (ref 39.0–52.0)
Hemoglobin: 13.5 g/dL (ref 13.0–17.0)
MCH: 31 pg (ref 26.0–34.0)
MCHC: 31.5 g/dL (ref 30.0–36.0)
MCV: 98.2 fL (ref 80.0–100.0)
Platelets: 111 K/uL — ABNORMAL LOW (ref 150–400)
RBC: 4.36 MIL/uL (ref 4.22–5.81)
RDW: 13.4 % (ref 11.5–15.5)
WBC: 7.2 K/uL (ref 4.0–10.5)
nRBC: 0 % (ref 0.0–0.2)

## 2024-03-29 LAB — BLOOD CULTURE ID PANEL (REFLEXED) - BCID2

## 2024-03-29 LAB — BASIC METABOLIC PANEL WITH GFR
Anion gap: 8 (ref 5–15)
BUN: 15 mg/dL (ref 8–23)
CO2: 30 mmol/L (ref 22–32)
Calcium: 9.3 mg/dL (ref 8.9–10.3)
Chloride: 110 mmol/L (ref 98–111)
Creatinine, Ser: 1.17 mg/dL (ref 0.61–1.24)
GFR, Estimated: >60 mL/min
Glucose, Bld: 258 mg/dL — ABNORMAL HIGH (ref 70–99)
Potassium: 4.1 mmol/L (ref 3.5–5.1)
Sodium: 148 mmol/L — ABNORMAL HIGH (ref 135–145)

## 2024-03-29 LAB — CBG MONITORING, ED
Glucose-Capillary: 287 mg/dL — ABNORMAL HIGH (ref 70–99)
Glucose-Capillary: 234 mg/dL — ABNORMAL HIGH (ref 70–99)
Glucose-Capillary: 175 mg/dL — ABNORMAL HIGH (ref 70–99)
Glucose-Capillary: 133 mg/dL — ABNORMAL HIGH (ref 70–99)
Glucose-Capillary: 124 mg/dL — ABNORMAL HIGH (ref 70–99)

## 2024-03-29 LAB — PHOSPHORUS: Phosphorus: 1.6 mg/dL — ABNORMAL LOW (ref 2.5–4.6)

## 2024-03-29 LAB — MAGNESIUM: Magnesium: 2.3 mg/dL (ref 1.7–2.4)

## 2024-03-29 LAB — GLUCOSE, CAPILLARY
Glucose-Capillary: 117 mg/dL — ABNORMAL HIGH (ref 70–99)
Glucose-Capillary: 218 mg/dL — ABNORMAL HIGH (ref 70–99)
Glucose-Capillary: 126 mg/dL — ABNORMAL HIGH (ref 70–99)
Glucose-Capillary: 147 mg/dL — ABNORMAL HIGH (ref 70–99)

## 2024-03-29 LAB — BETA-HYDROXYBUTYRIC ACID: Beta-Hydroxybutyric Acid: 1.32 mmol/L — ABNORMAL HIGH (ref 0.05–0.27)

## 2024-03-29 LAB — MRSA NEXT GEN BY PCR, NASAL: MRSA by PCR Next Gen: NOT DETECTED

## 2024-03-29 MED ORDER — INSULIN ASPART 100 UNIT/ML IJ SOLN
0.0000 [IU] | Freq: Three times a day (TID) | INTRAMUSCULAR | Status: DC
  Administered 2024-03-29: 5 [IU] via SUBCUTANEOUS
  Administered 2024-03-29 – 2024-03-30 (×2): 2 [IU] via SUBCUTANEOUS
  Administered 2024-03-30: 3 [IU] via SUBCUTANEOUS
  Filled 2024-03-29 (×2): qty 1

## 2024-03-29 MED ORDER — ROSUVASTATIN CALCIUM 20 MG PO TABS
20.0000 mg | ORAL_TABLET | Freq: Every day | ORAL | Status: DC
  Administered 2024-03-29 – 2024-04-01 (×3): 20 mg via ORAL
  Filled 2024-03-29: qty 1

## 2024-03-29 MED ORDER — METOPROLOL TARTRATE 25 MG PO TABS
12.5000 mg | ORAL_TABLET | Freq: Two times a day (BID) | ORAL | Status: DC
  Administered 2024-03-29 – 2024-04-01 (×6): 12.5 mg via ORAL
  Filled 2024-03-29 (×2): qty 1

## 2024-03-29 MED ORDER — INSULIN GLARGINE-YFGN 100 UNIT/ML ~~LOC~~ SOLN
10.0000 [IU] | SUBCUTANEOUS | Status: DC
  Administered 2024-03-29 – 2024-03-30 (×2): 10 [IU] via SUBCUTANEOUS
  Filled 2024-03-29 (×2): qty 0.1

## 2024-03-29 MED ORDER — APIXABAN 5 MG PO TABS
5.0000 mg | ORAL_TABLET | Freq: Two times a day (BID) | ORAL | Status: DC
  Administered 2024-03-29 – 2024-04-01 (×6): 5 mg via ORAL
  Filled 2024-03-29 (×2): qty 1

## 2024-03-29 MED ORDER — VANCOMYCIN HCL IN DEXTROSE 1-5 GM/200ML-% IV SOLN
1000.0000 mg | INTRAVENOUS | Status: DC
  Administered 2024-03-29: 1000 mg via INTRAVENOUS
  Filled 2024-03-29: qty 200

## 2024-03-29 MED ORDER — INSULIN GLARGINE-YFGN 100 UNIT/ML ~~LOC~~ SOLN
6.0000 [IU] | SUBCUTANEOUS | Status: DC
  Filled 2024-03-29: qty 0.06

## 2024-03-29 MED ORDER — VANCOMYCIN HCL 1500 MG/300ML IV SOLN
1500.0000 mg | Freq: Once | INTRAVENOUS | Status: AC
  Administered 2024-03-29: 1500 mg via INTRAVENOUS
  Filled 2024-03-29: qty 300

## 2024-03-29 MED ORDER — SODIUM CHLORIDE 0.9 % IV SOLN
2.0000 g | Freq: Two times a day (BID) | INTRAVENOUS | Status: DC
  Administered 2024-03-29 – 2024-03-30 (×4): 2 g via INTRAVENOUS
  Filled 2024-03-29 (×4): qty 12.5

## 2024-03-29 MED ORDER — CHLORHEXIDINE GLUCONATE CLOTH 2 % EX PADS
6.0000 | MEDICATED_PAD | Freq: Every day | CUTANEOUS | Status: DC
  Administered 2024-03-29 – 2024-04-01 (×4): 6 via TOPICAL

## 2024-03-29 MED ORDER — INSULIN ASPART 100 UNIT/ML IJ SOLN
3.0000 [IU] | Freq: Three times a day (TID) | INTRAMUSCULAR | Status: DC
  Administered 2024-03-29 – 2024-03-30 (×3): 3 [IU] via SUBCUTANEOUS
  Filled 2024-03-29: qty 1

## 2024-03-29 NOTE — NC FL2 (Signed)
 " [place]  MEDICAID FL2 LEVEL OF CARE FORM     IDENTIFICATION  Patient Name: [name] Birthdate: Dec 30, 1940 Sex: male Admission Date (Current Location): 03/28/2024  [facility] and [place] Number:  [employer] and Address:  [facility],  [address], [place] [address]      Provider Number: [id number]  Attending Physician Name and Address:  Arlon Carliss ORN, DO  Relative Name and Phone Number:       Current Level of Care: Hospital Recommended Level of Care: Skilled Nursing Facility Prior Approval Number:    Date Approved/Denied:   PASRR Number: [id number]  Discharge Plan: SNF    Current Diagnoses: Patient Active Problem List   Diagnosis Date Noted   Uncontrolled type 2 diabetes mellitus with hyperglycemia, with long-term current use of insulin  (HCC) 03/29/2024   Acute metabolic encephalopathy 03/29/2024   Acute cystitis without hematuria 03/29/2024   Hyperosmolar hyperglycemic state (HHS) (HCC) 03/28/2024   Syncope 02/19/2024   Traumatic rhabdomyolysis 12/19/2023   AKI (acute kidney injury) 12/17/2023   Orthostatic hypotension 12/17/2023   DM (diabetes mellitus), secondary, with peripheral vascular complications (HCC) 12/04/2023   Hyperlipidemia associated with type 2 diabetes mellitus (HCC) 12/03/2023   Hypertension associated with type 2 diabetes mellitus (HCC) 12/03/2023   Major depression, recurrent, chronic 12/03/2023   OAB (overactive bladder) 12/03/2023   Aortic atherosclerosis 12/03/2023   Major neurocognitive disorder (HCC) 12/03/2023   Aphasia 11/29/2023   TIA (transient ischemic attack) 11/29/2023   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   Essential hypertension 11/18/2017   Aortic stenosis 04/27/2014   PAF (paroxysmal atrial fibrillation) (HCC) 09/15/2013   Current use of long term anticoagulation 09/15/2013   Dehydration 10/26/2011   Persistent atrial fibrillation (HCC) 05/15/2011   DM type 2 (diabetes mellitus, type 2)  (HCC) 05/15/2011   Hyperlipemia 05/15/2011   Obesity 05/15/2011   Benign hypertension 05/15/2011   H/O: stroke 05/15/2011    Orientation RESPIRATION BLADDER Height & Weight     Self  Normal Incontinent, External catheter Weight: 71.9 kg Height:  5' 8 (172.7 cm)  BEHAVIORAL SYMPTOMS/MOOD NEUROLOGICAL BOWEL NUTRITION STATUS      Continent Diet  AMBULATORY STATUS COMMUNICATION OF NEEDS Skin   Limited Assist Verbally Other (Comment), Skin abrasions (Abraisions & errythema to groin, BUE & BLE)                       Personal Care Assistance Level of Assistance  Bathing, Feeding, Dressing Bathing Assistance: Maximum assistance Feeding assistance: Limited assistance Dressing Assistance: Maximum assistance     Functional Limitations Info  Sight, Hearing Sight Info: Impaired (Glasses) Hearing Info: Adequate      SPECIAL CARE FACTORS FREQUENCY  PT (By licensed PT), OT (By licensed OT)     PT Frequency: 5x weekly OT Frequency: 5x weekly            Contractures Contractures Info: Not present    Additional Factors Info                  Current Medications (03/29/2024):  This is the current hospital active medication list Current Facility-Administered Medications  Medication Dose Route Frequency Provider Last Rate Last Admin   acetaminophen  (TYLENOL ) tablet 650 mg  650 mg Oral Q6H PRN Adefeso, Oladapo, DO       Or   acetaminophen  (TYLENOL ) suppository 650 mg  650 mg Rectal Q6H PRN Adefeso, Oladapo, DO   650 mg at 03/28/24 2354   apixaban  (ELIQUIS )  tablet 5 mg  5 mg Oral BID Adefeso, Oladapo, DO   5 mg at 03/29/24 0846   ceFEPIme  (MAXIPIME ) 2 g in sodium chloride  0.9 % 100 mL IVPB  2 g Intravenous Q12H Clair Lynwood CROME, [facility]   Stopped at 03/29/24 9083   Chlorhexidine  Gluconate Cloth 2 % PADS 6 each  6 each Topical [id number] Arlon Carliss ORN, DO   6 each at 03/29/24 9256   dextrose  5 % in lactated ringers  infusion   Intravenous Continuous Adefeso, Oladapo, DO   Stopped  at 03/29/24 9277   dextrose  50 % solution 0-50 mL  0-50 mL Intravenous PRN Adefeso, Oladapo, DO       insulin  aspart (novoLOG ) injection 0-15 Units  0-15 Units Subcutaneous TID WC Adefeso, Oladapo, DO   2 Units at 03/29/24 1644   insulin  aspart (novoLOG ) injection 3 Units  3 Units Subcutaneous TID WC Adefeso, Oladapo, DO   3 Units at 03/29/24 1132   insulin  glargine-yfgn (SEMGLEE ) injection 10 Units  10 Units Subcutaneous Q24H Adefeso, Oladapo, DO   10 Units at 03/29/24 9482   lactated ringers  infusion   Intravenous Continuous Adefeso, Oladapo, DO   Stopped at 03/29/24 0358   metoprolol  tartrate (LOPRESSOR ) tablet 12.5 mg  12.5 mg Oral BID Adefeso, Oladapo, DO   12.5 mg at 03/29/24 0846   ondansetron  (ZOFRAN ) tablet 4 mg  4 mg Oral Q6H PRN Adefeso, Oladapo, DO       Or   ondansetron  (ZOFRAN ) injection 4 mg  4 mg Intravenous Q6H PRN Adefeso, Oladapo, DO       rosuvastatin  (CRESTOR ) tablet 20 mg  20 mg Oral Daily Adefeso, Oladapo, DO   20 mg at 03/29/24 0846   terbinafine  (LAMISIL ) 1 % cream   Topical BID Adefeso, Oladapo, DO   Given at 03/29/24 9146   vancomycin  (VANCOCIN ) IVPB 1000 mg/200 mL premix  1,000 mg Intravenous Q24H [place], James L, [facility]         Discharge Medications: Please see discharge summary for a list of discharge medications.  Relevant Imaging Results:  Relevant Lab Results:   Additional Information    Ronnald MARLA Sil, RN     "

## 2024-03-29 NOTE — Plan of Care (Signed)
" °  Problem: Acute Rehab PT Goals(only PT should resolve) Goal: Pt Will Go Supine/Side To Sit Outcome: Progressing Flowsheets (Taken 03/29/2024 1203) Pt will go Supine/Side to Sit: with minimal assist Goal: Patient Will Transfer Sit To/From Stand Outcome: Progressing Flowsheets (Taken 03/29/2024 1203) Patient will transfer sit to/from stand: with contact guard assist Goal: Pt Will Transfer Bed To Chair/Chair To Bed Outcome: Progressing Flowsheets (Taken 03/29/2024 1203) Pt will Transfer Bed to Chair/Chair to Bed: with contact guard assist   Problem: Acute Rehab PT Goals(only PT should resolve) Goal: Pt Will Ambulate Flowsheets (Taken 03/29/2024 1203) Pt will Ambulate:  10 feet  with contact guard assist  with rolling walker   12:03 PM, 03/29/2024 Rosaria Settler, PT, DPT [place] with [facility]  "

## 2024-03-29 NOTE — Progress Notes (Signed)
 Date and time results received: 03/29/2024   Test: blood cultures Critical Value: 1 of 2 of the blood cultures came back positive for Klebsiella Pneumoniae Group  Name of Provider Notified: Adefeso  Orders Received? Or Actions Taken?:  MD aware. Awaiting further orders.  [place] RN

## 2024-03-29 NOTE — Evaluation (Signed)
 Clinical/Bedside Swallow Evaluation Patient Details  Name: [name] MRN: [id number] Date of Birth: [birth year]  Today's Date: 03/29/2024 Time: SLP Start Time (ACUTE ONLY): 1500 SLP Stop Time (ACUTE ONLY): 1528 SLP Time Calculation (min) (ACUTE ONLY): 28 min  Past Medical History:  Past Medical History:  Diagnosis Date   Atrial fibrillation (HCC)    Cholelithiasis 10/2011   Per CT. No cholecystitis radiographically.   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   Shortness of breath    exertion   Sleep apnea    cpap   Stroke [facility]) 2010/2013   Right Brain stroke   Past Surgical History:  Past Surgical History:  Procedure Laterality Date   CARDIOVERSION  07/12/2011   Procedure: CARDIOVERSION;  Surgeon: Vinie KYM Maxcy, MD;  Location: [facility] ENDOSCOPY;  Service: Cardiovascular;  Laterality: N/A;   CAROTID DOPPLER STUDY  01/31/2012   no significant extracranial carotid artery stenosis   CATARACT EXTRACTION W/PHACO Left 01/30/2019   Procedure: CATARACT EXTRACTION PHACO AND INTRAOCULAR LENS PLACEMENT (IOC) (CDE: 4.76);  Surgeon: Harrie Agent, MD;  Location: [facility];  Service: Ophthalmology;  Laterality: Left;   COLONOSCOPY     DOPPLER ECHOCARDIOGRAPHY  01/31/2012   EF 55-60%, moderately calcified annulus of the mitral valve, left atrium demonstrated mild-moderately dilated   KNEE SURGERY     TEE WITHOUT CARDIOVERSION  07/12/2011   Procedure: TRANSESOPHAGEAL ECHOCARDIOGRAM (TEE);  Surgeon: Vinie KYM Maxcy, MD;  Location: [facility] ENDOSCOPY;  Service: Cardiovascular;  Laterality: N/A;  to be done at 1330   TONSILLECTOMY     HPI:  83 y.o. male with medical history significant of hypertension, hyperlipidemia, atrial fibrillation on apixaban , CVA,  type 2 diabetes melitis, GERD, OSA on CPAP who presents to the emergency via EMS from assisted living facility due to unwitnessed fall.   Pt presents with mild moderate sensori motor oropharyngeal dysphagia. Oral phase is characterized by slightly decreased coordination and very slow manipulation of bolus, decreased BOT retraction and trace to min oral residue after the swallow. Pharyngeal stage is characterized by delayed swallow trigger, often triggered at the level of the pyriforms, decreased pharyngeal stripping wave and decreased laryngeal vestibule closure. The above pharyngeal impairments result in penetration and silent and sensed aspiration of thin liquids. With smaller sips of thin liquids note frank flash penetration and with larger presentations note consistent aspiration; Small amounts of aspiration were silent (PAS 8) and larger volume of aspiration was sensed and reflexive cough was not effective in clearing aspirates (PAS 7). Unfortunately Pt is cognitively unable to complete any of the attempted compensatory strategies. With NTL note occasional flash penetration. With HTL note significantly increased profuse pharyngeal residue. puree and regular textures were tolerated with prolonged mastication and mild to mod pharyngeal residue. Attempted barium tablet with NTL which Pt masticated and eventually expectorated. Recommend continue with D2/fine chopped diet and NECTAR thick liquids. Recommend crush meds in puree. Recommend slow rate and alternating bites and sips. Pt is a good candidate for free water protocol and continued water trials with SLP. If Pt could self-regulate small sips upgrade to thin liquids is possible, however given cognitive impairment suspect this is unrealistic without constant cues and supervision. Provided hand out with recommendations to  return to facility.    Assessment / Plan / Recommendation  Clinical Impression  Pt presents with mild cognitive based oropharyngeal  dysphagia; recommend D2/NTL and 1:1 support for meals. Crush meds; slow rate and alternating bites and sips. Pt is a good candidate for free water protocol and continued water trials with SLP.   Clinical swallowing evaluation completed while Pt was sitting upright in bed. Most recent MBSS completed ~3 months ago with recommendation for D2/NTL and that report is detailed above. Unsure if this recommendation has been followed at facility. Pt consumed thin liquids via cup and straw with immediate cough with cup/larger sips of water. With small controlled straw sips note no coughing or wet vocal quality. Pt consumed regular and soft fruit with prolonged mastication and liquid wash was effective for facilitating oral clearance. D/t observed overt coughing with thin liquids and hx of known silent aspiration and MBSS & report of pharyngeal residue recommend. Recommend D2/NTL and meds to be crushed in puree.  ST will follow acutely. SLP Visit Diagnosis: Dysphagia, unspecified (R13.10)    Aspiration Risk  Mild aspiration risk;Moderate aspiration risk    Diet Recommendation    D2/NECTAR Thick Liquids       Other Recommendations Oral Care Recommendations: Oral care BID     Swallow Evaluation Recommendations Recommendations: PO diet PO Diet Recommendation: Dysphagia 2 (Finely chopped);Mildly thick liquids (Level 2, nectar thick) Liquid Administration via: Cup;Straw Medication Administration: Crushed with puree Swallowing strategies  : Slow rate;Small bites/sips Postural changes: Stay upright 30-60 min after meals;Position pt fully upright for meals Oral care recommendations: Oral care BID (2x/day)         Frequency and Duration min 1 x/week  1 week       Prognosis Barriers to Reach Goals: Cognitive deficits      Swallow Study   General HPI: 83 y.o. male with medical history significant of hypertension, hyperlipidemia, atrial fibrillation on apixaban , CVA, type 2 diabetes melitis, GERD, OSA on  CPAP who presents to the emergency via EMS from assisted living facility due to unwitnessed fall. Type of Study: Bedside Swallow Evaluation Previous Swallow Assessment: MBSS 01/2024 Diet Prior to this Study: Regular;Thin liquids (Level 0) Temperature Spikes Noted: No Respiratory Status: Room air History of Recent Intubation: No Behavior/Cognition: Alert;Pleasant mood;Cooperative Oral Cavity Assessment: Within Functional Limits;Dry Oral Care Completed by SLP: Recent completion by staff Oral Cavity - Dentition: Adequate natural dentition Vision: Functional for self-feeding Self-Feeding Abilities: Able to feed self;Able to feed self with adaptive devices;Needs assist Patient Positioning: Upright in bed Baseline Vocal Quality: Normal Volitional Cough: Strong Volitional Swallow: Able to elicit    Oral/Motor/Sensory Function Overall Oral Motor/Sensory Function: Within functional limits   Ice Chips Ice chips: Within functional limits   Thin Liquid Thin Liquid: Impaired Presentation: Cup;Straw Oral Phase Impairments: Poor awareness of bolus Pharyngeal  Phase Impairments: Wet Vocal Quality;Cough - Immediate;Cough - Delayed    Nectar Thick Nectar Thick Liquid: Within functional limits   Honey Thick Honey Thick Liquid: Not tested   Puree Puree: Within functional limits   Solid     Solid: Within functional limits Presentation: Spoon     Reeda Soohoo H. Clois KILLIAN, CCC-SLP Speech Language Pathologist  Raguel VEAR Clois 03/29/2024,3:29 PM

## 2024-03-29 NOTE — Progress Notes (Signed)
 " Progress Note   Patient: [name] FMW:[id number] DOB: November 27, 1940 DOA: 03/28/2024  DOS: the patient was seen and examined on 03/29/2024   Brief hospital course:  83 y.o. male with medical history significant of hypertension, hyperlipidemia, atrial fibrillation on apixaban , CVA, type 2 diabetes melitis, GERD, OSA on CPAP who presents to the emergency via EMS from assisted living facility due to unwitnessed fall.   Assessment and Plan:  Acute metabolic encephalopathy/underlying dementia - Confused, intermittently delirious, calling out for help.  Answers some questions appropriately.  Continue to reorient.  Hyperosmolar hyperglycemic state - Markedly elevated glucose, AKI on presentation.  Elevated osmolality.  Aggressive IV fluid hydration, insulin  drip initiated.  Subsequently showed remarkable recovery of glucose, transition to subcu insulin .  Correct Phos, K.  Uncontrolled insulin  dependent diabetes mellitus with hyperglycemia - Will order A1c.  Currently on insulin  glargine 10 units daily with insulin  sliding scale.  Concern for urinary tract infection - Reported fever, UA noting LE, nitrites, WBCs.  On empiric antibiotics, ceftriaxone  1 g every 24 hours.  Monitor urine culture  Acute kidney injury - Creatinine elevated on presentation.  Showing continued improvement with aggressive IV fluid hydration.  Will recheck BMP in AM.  Physical debilitation muscle weakness/recurrent falls - PT/OT eval pending.  Persistent atrial flutter/fibrillation - Noted a flutter on telemetry.  Eliquis , metoprolol  on board.  Goals of care - Will work with patient, TOC on disposition planning.  Anticipate discharge 1-2 days.  Subjective: Patient showing some improvement this morning.  No longer on insulin  drip, transition to subcu insulin .  Still a bit confused.  Reported fever yesterday 101.39F.  Patient not complaining of any urinary symptoms.  Somewhat confused, when leaving the room crying out  for help.  Physical Exam:  Vitals:   03/29/24 1000 03/29/24 1100 03/29/24 1140 03/29/24 1200  BP: 115/71 113/64  119/78  Pulse:      Resp: 15 19  (!) 22  Temp:   97.6 F (36.4 C)   TempSrc:   Axillary   SpO2:      Weight:      Height:        GENERAL:  Alert, pleasant, no acute distress, disheveled HEENT:  EOMI, poor dentition CARDIOVASCULAR: Regular and tachycardic RESPIRATORY:  Clear to auscultation, no wheezing, rales, or rhonchi GASTROINTESTINAL:  Soft, nontender, nondistended EXTREMITIES:  No LE edema bilaterally NEURO:  No new focal deficits appreciated SKIN:  No rashes noted PSYCH:  Appropriate mood and affect     Data Reviewed:  Imaging Studies: CT Head Wo Contrast Result Date: 03/28/2024 EXAM: CT HEAD WITHOUT CONTRAST 03/28/2024 05:20:45 PM TECHNIQUE: CT of the head was performed without the administration of intravenous contrast. Automated exposure control, iterative reconstruction, and/or weight based adjustment of the mA/kV was utilized to reduce the radiation dose to as low as reasonably achievable. COMPARISON: 03/26/2024 CLINICAL HISTORY: Head trauma, minor (Age >= 65y) FINDINGS: BRAIN AND VENTRICLES: No acute hemorrhage. No evidence of acute infarct. Proportional prominence of ventricles and sulci, consistent with diffuse cerebral parenchymal volume loss. Confluent periventricular and subcortical white matter hypoattenuation, consistent with advanced chronic ischemic microvascular disease. Multifocal encephalomalacia in left frontal lobe and the left parieto-occipital lobes. Remote lacunar infarcts in the right basal ganglia and right thalamus. No extra-axial collection. No mass effect or midline shift. ORBITS: Left lens replacement. SINUSES: No acute abnormality. SOFT TISSUES AND SKULL: No acute soft tissue abnormality. No skull fracture. VASCULATURE: Calcified atherosclerotic plaque in cavernous/supraclinoid ICA and intradural vertebral arteries. IMPRESSION: 1.  No  acute intracranial abnormality. 2. Extensive chronic microvascular ischemic changes and remote infarcts are similar to prior. Electronically signed by: Donnice Mania MD 03/28/2024 06:52 PM EST RP Workstation: [id number]   CT Cervical Spine Wo Contrast Result Date: 03/28/2024 EXAM: CT CERVICAL SPINE WITHOUT CONTRAST 03/28/2024 05:20:45 PM TECHNIQUE: CT of the cervical spine was performed without the administration of intravenous contrast. Multiplanar reformatted images are provided for review. Automated exposure control, iterative reconstruction, and/or weight based adjustment of the mA/kV was utilized to reduce the radiation dose to as low as reasonably achievable. COMPARISON: 03/26/2024 CLINICAL HISTORY: Neck trauma (Age >= 65y) FINDINGS: BONES AND ALIGNMENT: Similar appearance of prominence confluent enthesopathy from C2 through T4. There is disruption of anterior osteophytes at the C6-C7 level which is unchanged from prior. Redemonstrated nondisplaced transverse fracture through the bridging osteophytes anteriorly at C5-C6. There is no evidence of traumatic malalignment. DEGENERATIVE CHANGES: Degenerative endplate osteophytes and disc space narrowing at multiple levels. Disc osteophyte complexes at multiple levels. Spinal canal stenosis is most pronounced at C5-C6 where a right eccentric disc osteophyte complex results in cord compression. There is facet arthrosis and uncovertebral hypertrophy at multiple levels. Foraminal stenosis throughout the cervical spine similar to prior, most pronounced on the left at C2-C3 and on the right at C5-C6. SOFT TISSUES: No prevertebral soft tissue swelling. Alignment of the atherosclerotic plaque at the carotid bifurcations. IMPRESSION: 1. Redemonstrated nondisplaced transverse fracture through the bridging osteophytes anteriorly at C5-C6. 2. No evidence of acute traumatic malalignment. 3. Degenerative changes are similar to prior. Spinal canal stenosis most pronounced at  C5-C6 with cord compression. Electronically signed by: Donnice Mania MD 03/28/2024 06:41 PM EST RP Workstation: [id number]   CT CHEST ABDOMEN PELVIS W CONTRAST Result Date: 03/28/2024 CLINICAL DATA:  Polytrauma, blunt.  Unwitnessed fall. EXAM: CT CHEST, ABDOMEN, AND PELVIS WITH CONTRAST TECHNIQUE: Multidetector CT imaging of the chest, abdomen and pelvis was performed following the standard protocol during bolus administration of intravenous contrast. RADIATION DOSE REDUCTION: This exam was performed according to the departmental dose-optimization program which includes automated exposure control, adjustment of the mA and/or kV according to patient size and/or use of iterative reconstruction technique. CONTRAST:  OMNIPAQUE  IOHEXOL  300 MG/ML  SOLN COMPARISON:  12/17/2023. FINDINGS: CT CHEST FINDINGS Cardiovascular: Heart is enlarged and there is a small pericardial effusion. Three-vessel coronary artery calcifications are noted. 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 Pulmonary trunk is distended suggesting underlying pulmonary artery hypertension. Mediastinum/Nodes: No mediastinal, hilar, or axillary lymphadenopathy. The thyroid  gland, trachea, and esophagus are within normal limits. Lungs/Pleura: Atelectasis is present bilaterally. No effusion or pneumothorax is seen. Musculoskeletal: Degenerative changes are present in the thoracic spine. No acute fracture is seen. CT ABDOMEN PELVIS FINDINGS Hepatobiliary: No focal liver abnormality is seen. Stones are present within the gallbladder. No biliary ductal dilatation. Pancreas: Unremarkable. No pancreatic ductal dilatation or surrounding inflammatory changes. Spleen: Normal in size without focal abnormality. Adrenals/Urinary Tract: The adrenal glands are within normal limits. The kidneys enhance symmetrically. Scattered hypodensities are present in the kidneys bilaterally, the  largest on the left is cystic in attenuation. No renal calculus or hydronephrosis bilaterally. The bladder is unremarkable. Stomach/Bowel: The stomach is within normal limits. No bowel obstruction, free air, or pneumatosis is seen. Appendix appears normal. Vascular/Lymphatic: Aortic atherosclerosis. No enlarged abdominal or pelvic lymph nodes. Reproductive: Prostate gland is enlarged. Other: No abdominopelvic ascites. A fat containing inguinal hernias present on the left. Musculoskeletal: Degenerative  changes are noted in the lumbar spine. Pars defects are present at L5 bilaterally with mild anterolisthesis at L5-S1. There is kyphosis of the upper lumbar spine. No acute fracture is seen. IMPRESSION: 1. No evidence of acute fracture or solid organ injury. 2. Cholelithiasis. 3. Enlarged prostate gland. 4. Coronary artery calcifications. 5. Aortic atherosclerosis. Electronically Signed   By: Leita Birmingham M.D.   On: 03/28/2024 18:41   DG FEMUR MIN 2 VIEWS LEFT Result Date: 03/28/2024 CLINICAL DATA:  Unwitnessed fall. EXAM: LEFT FEMUR 2 VIEWS COMPARISON:  None Available. FINDINGS: There is no evidence of fracture or other focal bone lesions. Knee arthroplasty is intact. Hip alignment is maintained with moderate degenerative change. Vascular calcifications. IMPRESSION: No acute fracture of the left femur. Electronically Signed   By: Andrea Gasman M.D.   On: 03/28/2024 17:47   CT CERVICAL SPINE WO CONTRAST Result Date: 03/26/2024 EXAM: CT HEAD, FACIAL BONES AND CERVICAL SPINE WITHOUT CONTRAST 03/26/2024 02:26:00 AM TECHNIQUE: CT of the head, facial bones and cervical spine was performed without the administration of intravenous contrast. Multiplanar reformatted images are provided for review. Automated exposure control, iterative reconstruction, and/or weight based adjustment of the mA/kV was utilized to reduce the radiation dose to as low as reasonably achievable. COMPARISON: Head, facial, and cervical spine  CT studies all on 02/25/2024, cervical spine CT 12/17/2023. CLINICAL HISTORY: Head trauma, minor (Age >= 65y) FINDINGS: CT HEAD BRAIN AND VENTRICLES: No acute intracranial hemorrhage. No mass effect or midline shift. No extra-axial fluid collection. No evidence of acute infarct. There is encephalomalacia due to chronic infarcts in the left frontal lobe and left parieto-occipital area. 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 There is a chronic band infarct in the right thalamocapsular area, lacunar infarct in the left thalamus. The carotid siphons and distal vertebral arteries are heavily calcified. No hyperdense vessel is seen. SKULL AND SCALP: No acute skull fracture. No scalp hematoma. CT FACIAL BONES FACIAL BONES: No acute facial fracture. No mandibular dislocation. No suspicious bone lesion. There is advanced tooth decay involving the left mandibular 12-year molar, with root caries and periapical lucencies involving the right maxillary molar dentition. Follow up with a dentist is recommended. ORBITS: No acute traumatic injury. There is no acute orbital finding with prior left lens replacement once again. SINUSES AND MASTOIDS: There is mild mucosal thickening in the floor of the bilateral maxillary sinuses. The other sinuses, bilateral mastoid air cells, and middle ear are clear. SOFT TISSUES: No acute abnormality. No facial hematoma or mass is seen. CT CERVICAL SPINE BONES AND ALIGNMENT: There is extensive anterior bulky bridging enthesopathy contiguous from C2 through T4. There is osteopenia. There is a nondisplaced transverse fracture through the bridging bone mantle again noted anteriorly at C5-C6, best seen on series 12 images 42 through 49 and series 10 images 27 through 29. This was present on 02/25/2024 but it was not present on 12/17/2023. There is no change in appearance of the fracture, no transmission into the adjacent  vertebral bodies and posterior elements. No new or acute fracture is evident, and no pathologic focal bone lesion is identified. No traumatic malalignment. DEGENERATIVE CHANGES: Partial disc space loss noted at all levels with calcifications in some of the discs, interbody ankylosis over portions of the disc spaces beginning at C3. Dorsal endplate osteophytes are present at most levels with variable cord encroachment, most significant at C5-C6 where there is a right lateralizing broad-based dorsal disc osteophyte  complex compressing the cord on the right greater than left. There is multilevel facet hypertrophy and uncinate spurring. Acquired foraminal stenosis is greatest on the left at C2-C3, on the right at C3-C4 and C4-C5, and on the right greater than left at C5-C6. SOFT TISSUES: No prevertebral soft tissue swelling. There is atherosclerosis in the aortic arch and great vessels with no acute findings in the visualized upper chest. There is heavy calcification of the carotid bifurcations. No laryngeal mass is seen. No spinal canal hematoma. IMPRESSION: 1. No acute intracranial CT findings or depressed skull fractures. 2. Nondisplaced transverse fracture through the anterior bridging bone mantle at C5-C6, stable compared to 02/25/2024, was not present on 12/17/2023. 3. Extensive anterior bulky bridging enthesopathy. . 4. 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 5. Advanced tooth decay involving the left mandibular molar and periapical lucencies involving the right maxillary molar dentition, and follow up with a dentist is recommended. 6. 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 7. No facial fracture is seen. 8. Chronic brain infarcts, heavy vascular calcifications. Electronically signed by: Francis Quam MD 03/26/2024 03:05 AM EST RP Workstation: [id number]   CT HEAD WO CONTRAST ( ) Result Date: 03/26/2024 EXAM: CT HEAD, FACIAL  BONES AND CERVICAL SPINE WITHOUT CONTRAST 03/26/2024 02:26:00 AM TECHNIQUE: CT of the head, facial bones and cervical spine was performed without the administration of intravenous contrast. Multiplanar reformatted images are provided for review. Automated exposure control, iterative reconstruction, and/or weight based adjustment of the mA/kV was utilized to reduce the radiation dose to as low as reasonably achievable. COMPARISON: Head, facial, and cervical spine CT studies all on 02/25/2024, cervical spine CT 12/17/2023. CLINICAL HISTORY: Head trauma, minor (Age >= 65y) FINDINGS: CT HEAD BRAIN AND VENTRICLES: No acute intracranial hemorrhage. No mass effect or midline shift. No extra-axial fluid collection. No evidence of acute infarct. There is encephalomalacia due to chronic infarcts in the left frontal lobe and left parieto-occipital area. 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 There is a chronic band infarct in the right thalamocapsular area, lacunar infarct in the left thalamus. The carotid siphons and distal vertebral arteries are heavily calcified. No hyperdense vessel is seen. SKULL AND SCALP: No acute skull fracture. No scalp hematoma. CT FACIAL BONES FACIAL BONES: No acute facial fracture. No mandibular dislocation. No suspicious bone lesion. There is advanced tooth decay involving the left mandibular 12-year molar, with root caries and periapical lucencies involving the right maxillary molar dentition. Follow up with a dentist is recommended. ORBITS: No acute traumatic injury. There is no acute orbital finding with prior left lens replacement once again. SINUSES AND MASTOIDS: There is mild mucosal thickening in the floor of the bilateral maxillary sinuses. The other sinuses, bilateral mastoid air cells, and middle ear are clear. SOFT TISSUES: No acute abnormality. No facial hematoma or mass is seen. CT CERVICAL SPINE BONES AND  ALIGNMENT: There is extensive anterior bulky bridging enthesopathy contiguous from C2 through T4. There is osteopenia. There is a nondisplaced transverse fracture through the bridging bone mantle again noted anteriorly at C5-C6, best seen on series 12 images 42 through 49 and series 10 images 27 through 29. This was present on 02/25/2024 but it was not present on 12/17/2023. There is no change in appearance of the fracture, no transmission into the adjacent vertebral bodies and posterior elements. No new or acute fracture is evident, and no pathologic  focal bone lesion is identified. No traumatic malalignment. DEGENERATIVE CHANGES: Partial disc space loss noted at all levels with calcifications in some of the discs, interbody ankylosis over portions of the disc spaces beginning at C3. Dorsal endplate osteophytes are present at most levels with variable cord encroachment, most significant at C5-C6 where there is a right lateralizing broad-based dorsal disc osteophyte complex compressing the cord on the right greater than left. There is multilevel facet hypertrophy and uncinate spurring. Acquired foraminal stenosis is greatest on the left at C2-C3, on the right at C3-C4 and C4-C5, and on the right greater than left at C5-C6. SOFT TISSUES: No prevertebral soft tissue swelling. There is atherosclerosis in the aortic arch and great vessels with no acute findings in the visualized upper chest. There is heavy calcification of the carotid bifurcations. No laryngeal mass is seen. No spinal canal hematoma. IMPRESSION: 1. No acute intracranial CT findings or depressed skull fractures. 2. Nondisplaced transverse fracture through the anterior bridging bone mantle at C5-C6, stable compared to 02/25/2024, was not present on 12/17/2023. 3. Extensive anterior bulky bridging enthesopathy. . 4. 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 5. Advanced tooth decay involving  the left mandibular molar and periapical lucencies involving the right maxillary molar dentition, and follow up with a dentist is recommended. 6. 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 7. No facial fracture is seen. 8. Chronic brain infarcts, heavy vascular calcifications. Electronically signed by: Francis Quam MD 03/26/2024 03:05 AM EST RP Workstation: [id number]   CT Maxillofacial W Contrast Result Date: 03/26/2024 EXAM: CT HEAD, FACIAL BONES AND CERVICAL SPINE WITHOUT CONTRAST 03/26/2024 02:26:00 AM TECHNIQUE: CT of the head, facial bones and cervical spine was performed without the administration of intravenous contrast. Multiplanar reformatted images are provided for review. Automated exposure control, iterative reconstruction, and/or weight based adjustment of the mA/kV was utilized to reduce the radiation dose to as low as reasonably achievable. COMPARISON: Head, facial, and cervical spine CT studies all on 02/25/2024, cervical spine CT 12/17/2023. CLINICAL HISTORY: Head trauma, minor (Age >= 65y) FINDINGS: CT HEAD BRAIN AND VENTRICLES: No acute intracranial hemorrhage. No mass effect or midline shift. No extra-axial fluid collection. No evidence of acute infarct. There is encephalomalacia due to chronic infarcts in the left frontal lobe and left parieto-occipital area. 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 There is a chronic band infarct in the right thalamocapsular area, lacunar infarct in the left thalamus. The carotid siphons and distal vertebral arteries are heavily calcified. No hyperdense vessel is seen. SKULL AND SCALP: No acute skull fracture. No scalp hematoma. CT FACIAL BONES FACIAL BONES: No acute facial fracture. No mandibular dislocation. No suspicious bone lesion. There is advanced tooth decay involving the left mandibular 12-year molar, with root caries and periapical lucencies involving the  right maxillary molar dentition. Follow up with a dentist is recommended. ORBITS: No acute traumatic injury. There is no acute orbital finding with prior left lens replacement once again. SINUSES AND MASTOIDS: There is mild mucosal thickening in the floor of the bilateral maxillary sinuses. The other sinuses, bilateral mastoid air cells, and middle ear are clear. SOFT TISSUES: No acute abnormality. No facial hematoma or mass is seen. CT CERVICAL SPINE BONES AND ALIGNMENT: There is extensive anterior bulky bridging enthesopathy contiguous from C2 through T4. There is osteopenia. There is a nondisplaced transverse fracture through the bridging bone mantle again  noted anteriorly at C5-C6, best seen on series 12 images 42 through 49 and series 10 images 27 through 29. This was present on 02/25/2024 but it was not present on 12/17/2023. There is no change in appearance of the fracture, no transmission into the adjacent vertebral bodies and posterior elements. No new or acute fracture is evident, and no pathologic focal bone lesion is identified. No traumatic malalignment. DEGENERATIVE CHANGES: Partial disc space loss noted at all levels with calcifications in some of the discs, interbody ankylosis over portions of the disc spaces beginning at C3. Dorsal endplate osteophytes are present at most levels with variable cord encroachment, most significant at C5-C6 where there is a right lateralizing broad-based dorsal disc osteophyte complex compressing the cord on the right greater than left. There is multilevel facet hypertrophy and uncinate spurring. Acquired foraminal stenosis is greatest on the left at C2-C3, on the right at C3-C4 and C4-C5, and on the right greater than left at C5-C6. SOFT TISSUES: No prevertebral soft tissue swelling. There is atherosclerosis in the aortic arch and great vessels with no acute findings in the visualized upper chest. There is heavy calcification of the carotid bifurcations. No laryngeal  mass is seen. No spinal canal hematoma. IMPRESSION: 1. No acute intracranial CT findings or depressed skull fractures. 2. Nondisplaced transverse fracture through the anterior bridging bone mantle at C5-C6, stable compared to 02/25/2024, was not present on 12/17/2023. 3. Extensive anterior bulky bridging enthesopathy. . 4. 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 5. Advanced tooth decay involving the left mandibular molar and periapical lucencies involving the right maxillary molar dentition, and follow up with a dentist is recommended. 6. 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 7. No facial fracture is seen. 8. Chronic brain infarcts, heavy vascular calcifications. Electronically signed by: Francis Quam MD 03/26/2024 03:05 AM EST RP Workstation: [id number]    Results are pending, will review when available.  Previous records (including but not limited to H&P, progress notes, nursing notes, TOC management) were reviewed in assessment of this patient.  Labs: CBC: Recent Labs  Lab 03/25/24 2359 03/28/24 1559 03/28/24 1623 03/29/24 0649  WBC 4.7 5.5  --  7.2  NEUTROABS  --  4.2  --   --   HGB 13.9 14.6 15.0 13.5  HCT 42.2 45.3 44.0 42.8  MCV 95.5 95.8  --  98.2  PLT 116* 127*  --  111*   Basic Metabolic Panel: Recent Labs  Lab 03/25/24 2359 03/28/24 1559 03/28/24 1623 03/28/24 1938 03/28/24 2132 03/29/24 0102 03/29/24 0649  NA 138 143 144 148* 149* 148*  --   K 4.3 4.7 4.4 3.3* 3.6 4.1  --   CL 100 102 104 111 110 110  --   CO2 32 23  --  21* 30 30  --   GLUCOSE 676* 741* >700* 408* 205* 258*  --   BUN 10 17 18 15 15 15   --   CREATININE 1.14 1.34* 1.30* 1.23 1.22 1.17  --   CALCIUM  9.4 9.9  --  9.0 9.7 9.3  --   MG  --   --   --   --   --   --  2.3  PHOS  --   --   --   --   --   --  1.6*   Liver Function Tests: Recent Labs  Lab 03/28/24 1559  AST 19  ALT 13  ALKPHOS  91  BILITOT 1.2   PROT 6.4*  ALBUMIN 3.8   CBG: Recent Labs  Lab 03/29/24 0413 03/29/24 0515 03/29/24 0616 03/29/24 0742 03/29/24 1111  GLUCAP 175* 133* 124* 117* 218*    Scheduled Meds:  apixaban   5 mg Oral BID   Chlorhexidine  Gluconate Cloth  6 each Topical Q0600   insulin  aspart  0-15 Units Subcutaneous TID WC   insulin  aspart  3 Units Subcutaneous TID WC   insulin  glargine-yfgn  10 Units Subcutaneous Q24H   metoprolol  tartrate  12.5 mg Oral BID   rosuvastatin   20 mg Oral Daily   terbinafine    Topical BID   Continuous Infusions:  ceFEPime  (MAXIPIME ) IV 2 g (03/29/24 0846)   dextrose  5% lactated ringers  Stopped (03/29/24 0722)   insulin  Stopped (03/29/24 0721)   lactated ringers  Stopped (03/29/24 0358)   vancomycin      PRN Meds:.acetaminophen  **OR** acetaminophen , dextrose , ondansetron  **OR** ondansetron  (ZOFRAN ) IV  Family Communication: None at bedside  Disposition: Status is: Inpatient Remains inpatient appropriate because: HHS, UTI     Time spent: 40 minutes  Length of inpatient stay: 0 days  Author: Carliss LELON Canales, DO 03/29/2024 12:15 PM  For on call review [email or web address].   "

## 2024-03-29 NOTE — Progress Notes (Signed)
 Date and time results received: 03/29/2024 1425 (use smartphrase .now to insert current time)  Test: Blood Cultures Critical Value: Aerobic bottle: positive for gram negative rods  Name of Provider Notified: Dr. Arlon  Orders Received? Or Actions Taken?: MD notified

## 2024-03-29 NOTE — Progress Notes (Signed)
 Date and time results received: 03/29/2024 1000 (use smartphrase .now to insert current time)  Test: Serum Osmolality Critical Value: 342  Name of Provider Notified: Dr. Arlon  Orders Received? Or Actions Taken?: MD and Primary RN notified

## 2024-03-29 NOTE — TOC Initial Note (Signed)
 Transition of Care [facility]) - Initial/Assessment Note    Patient Details  Name: [name] MRN: [id number] Date of Birth: 05-10-1940  [facility]) CM/SW Contact:    Ronnald MARLA Sil, RN Phone Number: 03/29/2024, 4:35 PM  Clinical Narrative:                 Patient presented via EMS from [facility] in [place] (Ph: [phone], Admin: Chassidy) with report of recurrent Falls over 2-days.  Patient is resident of ALF, Spouse is on ILF side (?).  Patient with PMH of dementia, currently on Oriented to self, PT eval with rec for short term rehab at [facility].  CM noted patient transitioned to [facility] after admission back in Sept.    IPCM team will continue to follow along and assist as appropriate in developing safe discharge plan.   Expected Discharge Plan: Skilled Nursing Facility Barriers to Discharge: Continued Medical Work up  Patient Goals and CMS Choice Patient states their goals for this hospitalization and ongoing recovery are:: SNF for Short Term Rehab  Expected Discharge Plan and Services  Post Acute Care Choice: Skilled Nursing Facility Living arrangements for the past 2 months: Assisted Living Facility ([facility], [place])  Prior Living Arrangements/Services Living arrangements for the past 2 months: Assisted Living Facility ([facility], [place]) Lives with:: Spouse  Activities of Daily Living   ADL Screening (condition at time of admission) Independently performs ADLs?: No Does the patient have a NEW difficulty with bathing/dressing/toileting/self-feeding that is expected to last >3 days?: No Does the patient have a NEW difficulty with getting in/out of bed, walking, or climbing stairs that is expected to last >3 days?: No Does the patient have a NEW difficulty with communication that is expected to last >3 days?: No Is the patient deaf or have difficulty hearing?: No Does the patient have difficulty  seeing, even when wearing glasses/contacts?: No Does the patient have difficulty concentrating, remembering, or making decisions?: Yes  Permission Sought/Granted  Emotional Assessment Appearance:: Appears stated age Orientation: : Oriented to Self    Admission diagnosis:  Acute metabolic encephalopathy [G93.41] Hyperosmolar hyperglycemic state (HHS) (HCC) [E11.00] Patient Active Problem List   Diagnosis Date Noted   Uncontrolled type 2 diabetes mellitus with hyperglycemia, with long-term current use of insulin  (HCC) 03/29/2024   Acute metabolic encephalopathy 03/29/2024   Acute cystitis without hematuria 03/29/2024   Hyperosmolar hyperglycemic state (HHS) (HCC) 03/28/2024   Syncope 02/19/2024   Traumatic rhabdomyolysis 12/19/2023   AKI (acute kidney injury) 12/17/2023   Orthostatic hypotension 12/17/2023   DM (diabetes mellitus), secondary, with peripheral vascular complications (HCC) 12/04/2023   Hyperlipidemia associated with type 2 diabetes mellitus (HCC) 12/03/2023   Hypertension associated with type 2 diabetes mellitus (HCC) 12/03/2023   Major depression, recurrent, chronic 12/03/2023   OAB (overactive bladder) 12/03/2023   Aortic atherosclerosis 12/03/2023   Major neurocognitive disorder (HCC) 12/03/2023   Aphasia 11/29/2023   TIA (transient ischemic attack) 11/29/2023   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   Essential hypertension 11/18/2017   Aortic stenosis 04/27/2014   PAF (paroxysmal atrial fibrillation) (HCC) 09/15/2013   Current use of long term anticoagulation 09/15/2013   Dehydration 10/26/2011   Persistent atrial fibrillation (HCC) 05/15/2011   DM type 2 (diabetes mellitus, type 2) (HCC) 05/15/2011   Hyperlipemia 05/15/2011   Obesity 05/15/2011   Benign hypertension 05/15/2011   H/O: stroke 05/15/2011   PCP:  Nichole Senior, MD Pharmacy:   [facility] [phone] -  [place] - [address] [place] [address] Phone:  [phone] Fax: [phone]   Social Drivers of Health (SDOH) Social History: SDOH Screenings   Food Insecurity: No Food Insecurity (03/29/2024)  Housing: Low Risk (03/29/2024)  Transportation Needs: No Transportation Needs (03/29/2024)  Utilities: Not At Risk (03/29/2024)  Social Connections: Patient Unable To Answer (03/29/2024)  Tobacco Use: Low Risk (03/28/2024)   SDOH Interventions:  Readmission Risk Interventions    03/29/2024    4:24 PM  Readmission Risk Prevention Plan  Transportation Screening Complete  PCP or Specialist Appt within 3-5 Days Complete  Medication Review (RN Care Manager) Complete

## 2024-03-29 NOTE — Progress Notes (Signed)
 Pharmacy Antibiotic Note  [name] is a 83 y.o. male admitted on 03/28/2024 with fever.  Pharmacy has been consulted for Vancomycin  dosing. WBC WNL. CrCl ~45.   Plan: Vancomycin  1500 mg IV x 1, then 1000 mg IV q24h >>>Estimated AUC: 436 Cefepime  per MD Trend WBC, temp, renal function  F/U infectious work-up Drug levels as indicated   Height: 5' 8 (172.7 cm) Weight: 76 kg (167 lb 8.8 oz) IBW/kg (Calculated) : 68.4  Temp (24hrs), Avg:100.1 F (37.8 C), Min:99.1 F (37.3 C), Max:101.4 F (38.6 C)  Recent Labs  Lab 03/25/24 2359 03/28/24 1559 03/28/24 1623 03/28/24 1938 03/28/24 2132  WBC 4.7 5.5  --   --   --   CREATININE 1.14 1.34* 1.30* 1.23 1.22  LATICACIDVEN 2.2*  --   --   --   --     Estimated Creatinine Clearance: 45.2 mL/min (by C-G formula based on SCr of 1.22 mg/dL).    Allergies[1]  Lynwood Mckusick, PharmD, BCPS Clinical Pharmacist Phone: [phone]      [1] No Known Allergies

## 2024-03-29 NOTE — Hospital Course (Signed)
 83 y.o. male with medical history significant of hypertension, hyperlipidemia, atrial fibrillation on apixaban , CVA, type 2 diabetes melitis, GERD, OSA on CPAP who presents to the emergency via EMS from assisted living facility due to unwitnessed fall.    Assessment and Plan:   Acute metabolic encephalopathy/underlying dementia - Confused, intermittently delirious, calling out for help.  Answers some questions appropriately.  Continue to reorient.   Hyperosmolar hyperglycemic state - Markedly elevated glucose, AKI on presentation.  Elevated osmolality.  Aggressive IV fluid hydration, insulin  drip initiated.  Subsequently showed remarkable recovery of glucose, transition to subcu insulin .  Correct Phos, K.   Uncontrolled insulin  dependent diabetes mellitus with hyperglycemia - Will order A1c.  Currently on insulin  glargine 10 units daily with insulin  sliding scale.   Concern for urinary tract infection - Reported fever, UA noting LE, nitrites, WBCs.  On empiric antibiotics, ceftriaxone  1 g every 24 hours.  Monitor urine culture   Acute kidney injury - Creatinine elevated on presentation.  Showing continued improvement with aggressive IV fluid hydration.  Will recheck BMP in AM.   Physical debilitation muscle weakness/recurrent falls - PT/OT eval pending.   Persistent atrial flutter/fibrillation - Noted a flutter on telemetry.  Eliquis , metoprolol  on board.   Goals of care - Will work with patient, TOC on disposition planning.  Anticipate discharge 1-2 days.

## 2024-03-29 NOTE — Progress Notes (Addendum)
 Progress Note  Patient started to spike fever (101.25F rectally) few hours after admission.  Blood culture was obtained.  Urinalysis was positive for nitrites, though there are only trace leukocytes with no bacteria, but patient was unable to express any urinary symptoms at this time.  BP 113/78   Pulse (!) 131   Temp (!) 101.4 F (38.6 C) (Rectal)   Resp 20   Ht 5' 8 (1.727 m)   Wt 76 kg   SpO2 94%   BMI 25.48 kg/m   Assessment and plan SIRS with suspicion for sepsis Patient MSs criteria due to being febrile, tachypneic and tachycardic Urinary source is suspected as source of infection, but broad-spectrum antibiotics will be started at this time with plan to de-escalate/discontinue based on blood culture. Urine cultures done on 02/19/2024 and 12/18/2023 were positive for Klebsiella pneumoniae both of which were sensitive to cefepime . Patient will be started on IV vancomycin  and cefepime  at this time Continue Tylenol  as needed  Type 2 diabetes mellitus with HHS Transition to subcu insulin  Serum glucose 258,  bicarb 30 She was started on insulin  drip, IV LR with IV potassium per DKA protocol in the ED Anion gap already closed, currently at 8 Continue IV D5 LR and maintain blood glucose within 200-250 mg/dL Clear liquid consistent carb diet will be provided Subcu insulin  10 units x 1 will be given Continue IV D5 LR for about 2 hours after subcu insulin  and check BMP prior to discontinuing IV drip Notify physician if patient is able to tolerate above without vomiting and patient will be started on basal insulin  regimen and sliding scale If patient continues to have nausea/vomiting, patient may require further IV D5 half-normal saline and titrated insulin  drip pending better control of nausea/vomiting.    Total time:  49 minutes This includes time reviewing the chart including progress notes, labs, EKGs, taking medical decisions, ordering labs and documenting findings.

## 2024-03-29 NOTE — Evaluation (Signed)
 Physical Therapy Evaluation Patient Details Name: [name] MRN: [id number] DOB: [birth year] Today's Date: 03/29/2024  History of Present Illness  [name] is a 83 y.o. male with medical history significant of hypertension, hyperlipidemia, atrial fibrillation on apixaban , CVA, type 2 diabetes melitis, GERD, OSA on CPAP who presents to the emergency via EMS from assisted living facility due to unwitnessed fall.  At bedside, patient was unable to provide history possibly due to underlying dementia.     He presented to the ED on 12/24 due to an unwitnessed fall where he sustained a skin tear to the right elbow and bleeding from mouth suspected to be due to either injury or possibly even a mass.  CT maxillofacial done showed no specific findings.  He was hyperglycemic, IV fluids and insulin  drip was given, blood sugar improved to 250 and patient was discharged back to assisted living facility.   Clinical Impression  Patient appears agreeable to PT evaluation. Patient is unable to provide history due to history of dementia but per chart, patient was previously able to ambulate with RW and independent with ADL but required some assist with iADLs. Patient arrives from ALF. No family present to confirm previous history. This date, patient requires moderate assist with bed mobility and min/mod for transfer and very short ambulation trial with RW. Pt limited due to general weakness, and increased fatigue. HR fluctuates during mobility as well as SpO2 but could be due to faulty equipment as both recovered well. Pt tolerates sitting in chair at end of session, chair alarm set, call button in reach and all needs met. Nursing staff made aware. Patient will benefit from continued skilled physical therapy acutely and in recommended venue in order to address current deficits and improve overall function.       If plan is discharge home, recommend the following: A lot of help with walking and/or transfers;A little  help with bathing/dressing/bathroom;Supervision due to cognitive status;Assist for transportation   Can travel by private vehicle        Equipment Recommendations None recommended by PT  Recommendations for Other Services       Functional Status Assessment Patient has had a recent decline in their functional status and demonstrates the ability to make significant improvements in function in a reasonable and predictable amount of time.     Precautions / Restrictions Precautions Precautions: Fall Recall of Precautions/Restrictions: Impaired Restrictions Weight Bearing Restrictions Per Provider Order: No      Mobility  Bed Mobility Overal bed mobility: Needs Assistance Bed Mobility: Supine to Sit     Supine to sit: Mod assist     General bed mobility comments: HOB flat, pt with slow labored movement, required assist with LE management and trunk elevation, also required step by step verbal cueing    Transfers Overall transfer level: Needs assistance Equipment used: Rolling walker (2 wheels) Transfers: Sit to/from Stand, Bed to chair/wheelchair/BSC Sit to Stand: Mod assist, Min assist   Step pivot transfers: Min assist, Mod assist       General transfer comment: STS from bed with RW and moderate assist due to LE weakness, slight knee buckling at times throughout, slow labored movement    Ambulation/Gait Ambulation/Gait assistance: Min assist, Mod assist Gait Distance (Feet): 3 Feet Assistive device: Rolling walker (2 wheels) Gait Pattern/deviations: Step-to pattern, Decreased step length - right, Decreased step length - left, Decreased stride length, Knees buckling, Trunk flexed Gait velocity: Dec     General Gait Details: Pt limited  to a few side steps at bedside with RW and min/mod assist due to unsteadiness and general LE weakness causing slight buckling of knees, posterior lean towards end, slow labored movement throughout with pt reporting inc fatigue at end,  increased HR throughout  Stairs            Wheelchair Mobility     Tilt Bed    Modified Rankin (Stroke Patients Only)       Balance Overall balance assessment: Needs assistance Sitting-balance support: Feet supported, Bilateral upper extremity supported Sitting balance-Leahy Scale: Fair Sitting balance - Comments: Seated EOB   Standing balance support: During functional activity, Reliant on assistive device for balance, Bilateral upper extremity supported Standing balance-Leahy Scale: Poor Standing balance comment: with RW                             Pertinent Vitals/Pain Pain Assessment Pain Assessment: No/denies pain    Home Living Family/patient expects to be discharged to:: Assisted living                 Home Equipment: Agricultural Consultant (2 wheels);Cane - quad;BSC/3in1 Additional Comments: Per chart review pt from [facility]    Prior Function Prior Level of Function : Needs assist;History of Falls (last six months);Patient poor historian/Family not available       Physical Assist : ADLs (physical)   ADLs (physical): IADLs Mobility Comments: pt poor historian secondary to history of dementia, per previous admission pt's wife notes that he ambulated with a RW for community mobility ADLs Comments: pt poor historian secondary to history of dementia, per previous admission, reports independence with ADLs and staff at facility assist with  iADLs.     Extremity/Trunk Assessment   Upper Extremity Assessment Upper Extremity Assessment: Generalized weakness    Lower Extremity Assessment Lower Extremity Assessment: Generalized weakness    Cervical / Trunk Assessment Cervical / Trunk Assessment: Kyphotic  Communication   Communication Communication: No apparent difficulties    Cognition Arousal: Alert Behavior During Therapy: WFL for tasks assessed/performed, Restless   PT - Cognitive impairments: History of cognitive  impairments                         Following commands: Intact       Cueing Cueing Techniques: Verbal cues, Tactile cues, Visual cues     General Comments      Exercises     Assessment/Plan    PT Assessment Patient needs continued PT services;All further PT needs can be met in the next venue of care  PT Problem List Decreased strength;Decreased activity tolerance;Decreased balance;Decreased mobility;Decreased safety awareness;Decreased cognition       PT Treatment Interventions DME instruction;Balance training;Gait training;Functional mobility training;Therapeutic activities;Therapeutic exercise;Patient/family education    PT Goals (Current goals can be found in the Care Plan section)  Acute Rehab PT Goals Patient Stated Goal: pt unable to verbalize goals this date, would recommend rehab for pt PT Goal Formulation: With patient Time For Goal Achievement: 04/12/24 Potential to Achieve Goals: Good    Frequency Min 3X/week     Co-evaluation               AM-PAC PT 6 Clicks Mobility  Outcome Measure Help needed turning from your back to your side while in a flat bed without using bedrails?: A Little Help needed moving from lying on your back to sitting on the side of  a flat bed without using bedrails?: A Little Help needed moving to and from a bed to a chair (including a wheelchair)?: A Lot Help needed standing up from a chair using your arms (e.g., wheelchair or bedside chair)?: A Little Help needed to walk in hospital room?: A Lot Help needed climbing 3-5 steps with a railing? : A Lot 6 Click Score: 15    End of Session Equipment Utilized During Treatment: Gait belt Activity Tolerance: Patient tolerated treatment well;Patient limited by fatigue Patient left: in chair;with call bell/phone within reach;with chair alarm set Nurse Communication: Mobility status PT Visit Diagnosis: Unsteadiness on feet (R26.81);Other abnormalities of gait and mobility  (R26.89);Repeated falls (R29.6);Muscle weakness (generalized) (M62.81);Difficulty in walking, not elsewhere classified (R26.2)    Time: 9082-9062 PT Time Calculation (min) (ACUTE ONLY): 20 min   Charges:   PT Evaluation $PT Eval Low Complexity: 1 Low   PT General Charges $$ ACUTE PT VISIT: 1 Visit         12:01 PM, 03/29/2024 Valentine Barney Powell-Butler, PT, DPT [place] [facility]

## 2024-03-30 DIAGNOSIS — E1100 Type 2 diabetes mellitus with hyperosmolarity without nonketotic hyperglycemic-hyperosmolar coma (NKHHC): Secondary | ICD-10-CM | POA: Diagnosis not present

## 2024-03-30 DIAGNOSIS — N3000 Acute cystitis without hematuria: Secondary | ICD-10-CM | POA: Diagnosis not present

## 2024-03-30 DIAGNOSIS — G9341 Metabolic encephalopathy: Secondary | ICD-10-CM | POA: Diagnosis not present

## 2024-03-30 DIAGNOSIS — E1165 Type 2 diabetes mellitus with hyperglycemia: Secondary | ICD-10-CM | POA: Diagnosis not present

## 2024-03-30 LAB — CBC
HCT: 43.3 % (ref 39.0–52.0)
Hemoglobin: 13.7 g/dL (ref 13.0–17.0)
MCH: 30.6 pg (ref 26.0–34.0)
MCHC: 31.6 g/dL (ref 30.0–36.0)
MCV: 96.7 fL (ref 80.0–100.0)
Platelets: 111 K/uL — ABNORMAL LOW (ref 150–400)
RBC: 4.48 MIL/uL (ref 4.22–5.81)
RDW: 13.4 % (ref 11.5–15.5)
WBC: 6.6 K/uL (ref 4.0–10.5)
nRBC: 0 % (ref 0.0–0.2)

## 2024-03-30 LAB — BASIC METABOLIC PANEL WITH GFR
Anion gap: 6 (ref 5–15)
BUN: 23 mg/dL (ref 8–23)
CO2: 28 mmol/L (ref 22–32)
Calcium: 9 mg/dL (ref 8.9–10.3)
Chloride: 106 mmol/L (ref 98–111)
Creatinine, Ser: 1.12 mg/dL (ref 0.61–1.24)
GFR, Estimated: >60 mL/min
Glucose, Bld: 187 mg/dL — ABNORMAL HIGH (ref 70–99)
Potassium: 3.9 mmol/L (ref 3.5–5.1)
Sodium: 140 mmol/L (ref 135–145)

## 2024-03-30 LAB — GLUCOSE, CAPILLARY
Glucose-Capillary: 225 mg/dL — ABNORMAL HIGH (ref 70–99)
Glucose-Capillary: 184 mg/dL — ABNORMAL HIGH (ref 70–99)
Glucose-Capillary: 121 mg/dL — ABNORMAL HIGH (ref 70–99)
Glucose-Capillary: 83 mg/dL (ref 70–99)
Glucose-Capillary: 52 mg/dL — ABNORMAL LOW (ref 70–99)
Glucose-Capillary: 147 mg/dL — ABNORMAL HIGH (ref 70–99)

## 2024-03-30 LAB — URINE CULTURE: Culture: >=100000 — AB

## 2024-03-30 LAB — MAGNESIUM: Magnesium: 2.1 mg/dL (ref 1.7–2.4)

## 2024-03-30 LAB — HEMOGLOBIN A1C
Hgb A1c MFr Bld: 9.6 % — ABNORMAL HIGH (ref 4.8–5.6)
Mean Plasma Glucose: 228.82 mg/dL

## 2024-03-30 LAB — PHOSPHORUS: Phosphorus: 2.3 mg/dL — ABNORMAL LOW (ref 2.5–4.6)

## 2024-03-30 MED ORDER — OXYBUTYNIN CHLORIDE ER 5 MG PO TB24
10.0000 mg | ORAL_TABLET | Freq: Every day | ORAL | Status: DC
  Administered 2024-03-30 – 2024-03-31 (×2): 10 mg via ORAL
  Filled 2024-03-30 (×2): qty 2

## 2024-03-30 MED ORDER — SODIUM CHLORIDE 0.9 % IV SOLN
2.0000 g | INTRAVENOUS | Status: DC
  Administered 2024-03-30 – 2024-03-31 (×2): 2 g via INTRAVENOUS
  Filled 2024-03-30 (×2): qty 20

## 2024-03-30 MED ORDER — TRAZODONE HCL 50 MG PO TABS
100.0000 mg | ORAL_TABLET | Freq: Every day | ORAL | Status: DC
  Administered 2024-03-30 – 2024-03-31 (×2): 100 mg via ORAL
  Filled 2024-03-30 (×2): qty 2

## 2024-03-30 MED ORDER — INSULIN GLARGINE 100 UNIT/ML ~~LOC~~ SOLN
10.0000 [IU] | Freq: Every day | SUBCUTANEOUS | Status: DC
  Administered 2024-04-01: 10 [IU] via SUBCUTANEOUS
  Filled 2024-03-30 (×3): qty 0.1

## 2024-03-30 MED ORDER — LORAZEPAM 2 MG/ML IJ SOLN
1.0000 mg | Freq: Four times a day (QID) | INTRAMUSCULAR | Status: DC | PRN
  Administered 2024-03-30 – 2024-03-31 (×2): 1 mg via INTRAVENOUS
  Filled 2024-03-30 (×2): qty 1

## 2024-03-30 MED ORDER — SODIUM CHLORIDE 0.9 % IV SOLN: 1.0000 g | INTRAVENOUS | Status: DC

## 2024-03-30 MED ORDER — QUETIAPINE FUMARATE 25 MG PO TABS
12.5000 mg | ORAL_TABLET | Freq: Every morning | ORAL | Status: DC
  Administered 2024-03-30 – 2024-04-01 (×2): 12.5 mg via ORAL
  Filled 2024-03-30 (×3): qty 1

## 2024-03-30 MED ORDER — FUROSEMIDE 20 MG PO TABS
20.0000 mg | ORAL_TABLET | Freq: Every morning | ORAL | Status: DC
  Administered 2024-03-30 – 2024-04-01 (×2): 20 mg via ORAL
  Filled 2024-03-30 (×3): qty 1

## 2024-03-30 MED ORDER — ESCITALOPRAM OXALATE 10 MG PO TABS
10.0000 mg | ORAL_TABLET | Freq: Every day | ORAL | Status: DC
  Administered 2024-03-30 – 2024-04-01 (×2): 10 mg via ORAL
  Filled 2024-03-30 (×3): qty 1

## 2024-03-30 NOTE — Progress Notes (Signed)
 PHARMACY NOTE:  ANTIMICROBIAL RENAL DOSAGE ADJUSTMENT  Current antimicrobial regimen includes a mismatch between antimicrobial dosage and estimated renal function.  As per policy approved by the Pharmacy & Therapeutics and Medical Executive Committees, the antimicrobial dosage will be adjusted accordingly.  Current antimicrobial dosage:  Ceftriaxone  1 gm every 24 hours   Indication: UTI  Renal Function:  Estimated Creatinine Clearance: 49.2 mL/min (by C-G formula based on SCr of 1.12 mg/dL). []      On intermittent HD, scheduled: []      On CRRT    Antimicrobial dosage has been changed to:  Increased ceftriaxone  to 2 gm daily with kleb pneumo in blood cultures   Additional comments:   Thank you for allowing pharmacy to be a part of this patient's care.  Damien Quiet, PharmD, BCPS, BCIDP Infectious Diseases Clinical Pharmacist Phone: [phone] 03/30/2024 11:52 AM

## 2024-03-30 NOTE — [place] FL2 (Signed)
 " [place]  MEDICAID FL2 LEVEL OF CARE FORM     IDENTIFICATION  Patient Name: [name] Birthdate: 20-May-1940 Sex: male Admission Date (Current Location): 03/28/2024  [facility] and [place] Number:  [employer] and Address:  [facility],  [address], [place] [address]      Provider Number: [id number]  Attending Physician Name and Address:  Arlon Carliss ORN, DO  Relative Name and Phone Number:       Current Level of Care: Hospital Recommended Level of Care: Skilled Nursing Facility Prior Approval Number:    Date Approved/Denied:   PASRR Number: [id number]  Discharge Plan: SNF    Current Diagnoses: Patient Active Problem List   Diagnosis Date Noted   Uncontrolled type 2 diabetes mellitus with hyperglycemia, with long-term current use of insulin  (HCC) 03/29/2024   Acute metabolic encephalopathy 03/29/2024   Acute cystitis without hematuria 03/29/2024   Hyperosmolar hyperglycemic state (HHS) (HCC) 03/28/2024   Syncope 02/19/2024   Traumatic rhabdomyolysis 12/19/2023   AKI (acute kidney injury) 12/17/2023   Orthostatic hypotension 12/17/2023   DM (diabetes mellitus), secondary, with peripheral vascular complications (HCC) 12/04/2023   Hyperlipidemia associated with type 2 diabetes mellitus (HCC) 12/03/2023   Hypertension associated with type 2 diabetes mellitus (HCC) 12/03/2023   Major depression, recurrent, chronic 12/03/2023   OAB (overactive bladder) 12/03/2023   Aortic atherosclerosis 12/03/2023   Major neurocognitive disorder (HCC) 12/03/2023   Aphasia 11/29/2023   TIA (transient ischemic attack) 11/29/2023   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   Essential hypertension 11/18/2017   Aortic stenosis 04/27/2014   PAF (paroxysmal atrial fibrillation) (HCC) 09/15/2013   Current use of long term anticoagulation 09/15/2013   Dehydration 10/26/2011   Persistent atrial fibrillation (HCC) 05/15/2011   DM type 2 (diabetes mellitus, type 2)  (HCC) 05/15/2011   Hyperlipemia 05/15/2011   Obesity 05/15/2011   Benign hypertension 05/15/2011   H/O: stroke 05/15/2011    Orientation RESPIRATION BLADDER Height & Weight     Self  Normal Incontinent, External catheter Weight: 158 lb 8.2 oz (71.9 kg) Height:  5' 8 (172.7 cm)  BEHAVIORAL SYMPTOMS/MOOD NEUROLOGICAL BOWEL NUTRITION STATUS      Continent Diet  AMBULATORY STATUS COMMUNICATION OF NEEDS Skin   Extensive Assist Verbally Other (Comment), Skin abrasions (Abraisions & errythema to groin, BUE & BLE)                       Personal Care Assistance Level of Assistance  Bathing, Feeding, Dressing Bathing Assistance: Maximum assistance Feeding assistance: Limited assistance Dressing Assistance: Maximum assistance     Functional Limitations Info  Sight, Hearing Sight Info: Impaired (Glasses) Hearing Info: Adequate      SPECIAL CARE FACTORS FREQUENCY  PT (By licensed PT), OT (By licensed OT)     PT Frequency: 5x weekly OT Frequency: 5x weekly            Contractures Contractures Info: Not present    Additional Factors Info  Code Status, Allergies Code Status Info: DNR-Limited Allergies Info: NKA           Current Medications (03/30/2024):  This is the current hospital active medication list Current Facility-Administered Medications  Medication Dose Route Frequency Provider Last Rate Last Admin   acetaminophen  (TYLENOL ) tablet 650 mg  650 mg Oral Q6H PRN Adefeso, Oladapo, DO       Or   acetaminophen  (TYLENOL ) suppository 650 mg  650 mg Rectal Q6H PRN Adefeso, Oladapo, DO   650  mg at 03/28/24 2354   apixaban  (ELIQUIS ) tablet 5 mg  5 mg Oral BID Adefeso, Oladapo, DO   5 mg at 03/30/24 0829   ceFEPIme  (MAXIPIME ) 2 g in sodium chloride  0.9 % 100 mL IVPB  2 g Intravenous Q12H Clair Lynwood CROME, RPH 200 mL/hr at 03/30/24 9166 2 g at 03/30/24 9166   Chlorhexidine  Gluconate Cloth 2 % PADS 6 each  6 each Topical [id number] Arlon Carliss ORN, DO   6 each at 03/30/24  0602   dextrose  50 % solution 0-50 mL  0-50 mL Intravenous PRN Adefeso, Oladapo, DO       insulin  aspart (novoLOG ) injection 0-15 Units  0-15 Units Subcutaneous TID WC Adefeso, Oladapo, DO   3 Units at 03/30/24 0831   insulin  aspart (novoLOG ) injection 3 Units  3 Units Subcutaneous TID WC Adefeso, Oladapo, DO   3 Units at 03/30/24 0831   insulin  glargine-yfgn (SEMGLEE ) injection 10 Units  10 Units Subcutaneous Q24H Adefeso, Oladapo, DO   10 Units at 03/30/24 9557   metoprolol  tartrate (LOPRESSOR ) tablet 12.5 mg  12.5 mg Oral BID Adefeso, Oladapo, DO   12.5 mg at 03/30/24 9170   ondansetron  (ZOFRAN ) tablet 4 mg  4 mg Oral Q6H PRN Adefeso, Oladapo, DO       Or   ondansetron  (ZOFRAN ) injection 4 mg  4 mg Intravenous Q6H PRN Adefeso, Oladapo, DO       rosuvastatin  (CRESTOR ) tablet 20 mg  20 mg Oral Daily Adefeso, Oladapo, DO   20 mg at 03/30/24 9170   terbinafine  (LAMISIL ) 1 % cream   Topical BID Adefeso, Oladapo, DO   Given at 03/30/24 0830     Discharge Medications: Please see discharge summary for a list of discharge medications.  Relevant Imaging Results:  Relevant Lab Results:   Additional Information SSN: [id number], [place]     "

## 2024-03-30 NOTE — Progress Notes (Signed)
 Lab called critical result of patient's second set of blood cultures in the aerobic bottle showing gram + rods. Dr. Arlon made aware. Patient receiving antibiotics.

## 2024-03-30 NOTE — Plan of Care (Signed)

## 2024-03-30 NOTE — Evaluation (Signed)
 Occupational Therapy Evaluation Patient Details Name: [name] MRN: [id number] DOB: 24-Sep-1940 Today's Date: 03/30/2024   History of Present Illness   [name] is a 83 y.o. male with medical history significant of hypertension, hyperlipidemia, atrial fibrillation on apixaban , CVA, type 2 diabetes melitis, GERD, OSA on CPAP who presents to the emergency via EMS from assisted living facility due to unwitnessed fall.  At bedside, patient was unable to provide history possibly due to underlying dementia.     He presented to the ED on 12/24 due to an unwitnessed fall where he sustained a skin tear to the right elbow and bleeding from mouth suspected to be due to either injury or possibly even a mass.  CT maxillofacial done showed no specific findings.  He was hyperglycemic, IV fluids and insulin  drip was given, blood sugar improved to 250 and patient was discharged back to assisted living facility. (per DO)     Clinical Impressions Pt being assisted by RN and NT staff for peri-care when this therapist entered the room. Pt required much assist for peri-care today by NT staff per observation. Pt required max A for supine to sit and sit to supine bed mobility. Difficult to assess B UE strength but pt was able to periodically grasp RW with B UE, but did not maintain grasp. Cognition limited safety of functional transfer, so pt was left at bedside and assisted back to supine position. Pt left in the bed with call bell within reach and bed alarm set. Pt will benefit from continued OT in the hospital to increase strength, balance, and endurance for safe ADL's.        If plan is discharge home, recommend the following:   A lot of help with walking and/or transfers;A lot of help with bathing/dressing/bathroom;Assistance with cooking/housework;Two people to help with bathing/dressing/bathroom;Assistance with feeding;Direct supervision/assist for medications management;Assist for transportation;Help  with stairs or ramp for entrance;Supervision due to cognitive status     Functional Status Assessment   Patient has had a recent decline in their functional status and demonstrates the ability to make significant improvements in function in a reasonable and predictable amount of time.     Equipment Recommendations   None recommended by OT             Precautions/Restrictions   Precautions Precautions: Fall Recall of Precautions/Restrictions: Impaired Restrictions Weight Bearing Restrictions Per Provider Order: No     Mobility Bed Mobility Overal bed mobility: Needs Assistance Bed Mobility: Supine to Sit, Sit to Supine     Supine to sit: Max assist Sit to supine: Max assist   General bed mobility comments: Much assist for moving B LE to EOB and for turnk control.    Transfers                   General transfer comment: Pt's cognitive function limited ability to complete a functional transfer today. Pt reluctant to try and stand with RW.      Balance Overall balance assessment: Needs assistance Sitting-balance support: Feet supported, Bilateral upper extremity supported Sitting balance-Leahy Scale: Poor Sitting balance - Comments: Seated EOB                                   ADL either performed or assessed with clinical judgement   ADL Overall ADL's : Needs assistance/impaired Eating/Feeding: Minimal assistance;Sitting;Set up   Grooming: Minimal assistance;Sitting   Upper  Body Bathing: Moderate assistance;Sitting;Minimal assistance   Lower Body Bathing: Maximal assistance;Total assistance;Bed level;Sitting/lateral leans   Upper Body Dressing : Minimal assistance;Moderate assistance;Sitting   Lower Body Dressing: Maximal assistance;Total assistance;Bed level;Sitting/lateral leans     Toilet Transfer Details (indicate cue type and reason): Unable to assess due to poor cognition. Likely needing much assist. Toileting- Clothing  Manipulation and Hygiene: Total assistance;Bed level Toileting - Clothing Manipulation Details (indicate cue type and reason): Pt assisted by NT staff for peri-care while in supine in bed.             Vision Baseline Vision/History: 1 Wears glasses Ability to See in Adequate Light: 1 Impaired Vision Assessment?: No apparent visual deficits Additional Comments: Difficult to fully assess given poor congition. No obvious deficits.     Perception Perception: Not tested       Praxis Praxis: Not tested       Pertinent Vitals/Pain Pain Assessment Pain Assessment: Faces Faces Pain Scale: Hurts little more Pain Location: General pain with bed mobility. Pain Descriptors / Indicators: Moaning, Guarding Pain Intervention(s): Monitored during session, Repositioned, Limited activity within patient's tolerance     Extremity/Trunk Assessment Upper Extremity Assessment Upper Extremity Assessment: Difficult to assess due to impaired cognition (Due to pt's cognitive status.)   Lower Extremity Assessment Lower Extremity Assessment: Defer to PT evaluation       Communication Communication Communication: No apparent difficulties   Cognition Arousal: Alert, Lethargic Behavior During Therapy: WFL for tasks assessed/performed, Restless, Agitated Cognition: Cognition impaired, No family/caregiver present to determine baseline         Attention impairment (select first level of impairment): Focused attention, Sustained attention Executive functioning impairment (select all impairments): Initiation, Sequencing, Reasoning OT - Cognition Comments: Pt struggled to follow commands today. Pt was initially alert but became more and more lethargic.                 Following commands: Impaired Following commands impaired: Follows one step commands inconsistently     Cueing  General Comments   Cueing Techniques: Verbal cues;Tactile cues;Visual cues                 Home Living  Family/patient expects to be discharged to:: Assisted living                             Home Equipment: Rolling Walker (2 wheels);Cane - quad;BSC/3in1   Additional Comments: per chart      Prior Functioning/Environment Prior Level of Function : Needs assist;History of Falls (last six months);Patient poor historian/Family not available       Physical Assist : ADLs (physical)   ADLs (physical): IADLs Mobility Comments: per chart: Community ambulator with RW. ADLs Comments: Per chart: independent ADL's; assist IADL's.    OT Problem List: Decreased strength;Decreased activity tolerance;Impaired balance (sitting and/or standing);Decreased cognition;Impaired vision/perception;Decreased safety awareness;Decreased knowledge of use of DME or AE   OT Treatment/Interventions: Self-care/ADL training;Therapeutic exercise;DME and/or AE instruction;Energy conservation;Therapeutic activities;Cognitive remediation/compensation;Patient/family education;Balance training;Visual/perceptual remediation/compensation      OT Goals(Current goals can be found in the care plan section)   Acute Rehab OT Goals Patient Stated Goal: None stated OT Goal Formulation: Patient unable to participate in goal setting Time For Goal Achievement: 04/13/24 Potential to Achieve Goals: Fair   OT Frequency:  Min 2X/week  End of Session Equipment Utilized During Treatment: Gait belt;Rolling walker (2 wheels) Nurse Communication: Mobility status  Activity Tolerance: Patient limited by lethargy;Other (comment) (Pt mildly agitated at times as well.) Patient left: in bed;with call bell/phone within reach;with bed alarm set  OT Visit Diagnosis: Unsteadiness on feet (R26.81);Other abnormalities of gait and mobility (R26.89);Muscle weakness (generalized) (M62.81);History of falling (Z91.81);Repeated falls (R29.6);Other symptoms and signs involving cognitive  function                Time: 1509-1530 OT Time Calculation (min): 21 min Charges:  OT General Charges $OT Visit: 1 Visit OT Evaluation $OT Eval Low Complexity: 1 Low  Cosette Prindle OT, MOT  Jayson Person 03/30/2024, 4:20 PM

## 2024-03-30 NOTE — Plan of Care (Signed)
" °  Problem: Acute Rehab OT Goals (only OT should resolve) Goal: Pt. Will Perform Eating Flowsheets (Taken 03/30/2024 1623) Pt Will Perform Eating:  with modified independence  sitting Goal: Pt. Will Perform Grooming Flowsheets (Taken 03/30/2024 1623) Pt Will Perform Grooming:  sitting  with supervision Goal: Pt. Will Perform Upper Body Bathing Flowsheets (Taken 03/30/2024 1623) Pt Will Perform Upper Body Bathing:  with supervision  sitting Goal: Pt. Will Perform Upper Body Dressing Flowsheets (Taken 03/30/2024 1623) Pt Will Perform Upper Body Dressing:  with supervision  sitting Goal: Pt. Will Perform Lower Body Dressing Flowsheets (Taken 03/30/2024 1623) Pt Will Perform Lower Body Dressing:  with supervision  with contact guard assist  sitting/lateral leans Goal: Pt. Will Transfer To Toilet Flowsheets (Taken 03/30/2024 1623) Pt Will Transfer to Toilet:  with contact guard assist  with min assist  stand pivot transfer Goal: Pt. Will Perform Toileting-Clothing Manipulation Flowsheets (Taken 03/30/2024 1623) Pt Will Perform Toileting - Clothing Manipulation and hygiene:  with min assist  sitting/lateral leans  with contact guard assist Goal: Pt/Caregiver Will Perform Home Exercise Program Flowsheets (Taken 03/30/2024 1623) Pt/caregiver will Perform Home Exercise Program:  Increased strength  Increased ROM  Both right and left upper extremity  With minimal assist  Kemaya Dorner OT, MOT  "

## 2024-03-30 NOTE — Inpatient Diabetes Management (Addendum)
 Inpatient Diabetes Program Recommendations  AACE/ADA: New Consensus Statement on Inpatient Glycemic Control   Target Ranges:  Prepandial:   less than 140 mg/dL      Peak postprandial:   less than 180 mg/dL (1-2 hours)      Critically ill patients:  140 - 180 mg/dL    Latest Reference Range & Units 03/29/24 01:50 03/29/24 02:56 03/29/24 04:13 03/29/24 05:15 03/29/24 06:16 03/29/24 07:42 03/29/24 11:11 03/29/24 16:23 03/29/24 21:20 03/30/24 03:38 03/30/24 07:19  Glucose-Capillary 70 - 99 mg/dL 712 (H) 765 (H) 824 (H) 133 (H) 124 (H) 117 (H) 218 (H) 126 (H) 147 (H) 225 (H) 184 (H)    Latest Reference Range & Units 03/28/24 16:30 03/28/24 17:31 03/28/24 18:01 03/28/24 19:02 03/28/24 19:33 03/28/24 20:32 03/28/24 22:15  Glucose-Capillary 70 - 99 mg/dL >399 (HH) 418 (HH) 437 (HH) 407 (H) 384 (H) 302 (H) 197 (H)    Latest Reference Range & Units 03/28/24 15:59 03/28/24 16:23 03/28/24 19:38 03/28/24 21:32 03/29/24 01:02 03/30/24 03:26  CO2 22 - 32 mmol/L 23  21 (L) 30 30 28   Glucose 70 - 99 mg/dL 258 (HH) >299 (HH) 591 (H) 205 (H) 258 (H) 187 (H)  Anion gap 5 - 15  18 (H)  17 (H) 10 8 6     Latest Reference Range & Units 11/29/23 03:07  Hemoglobin A1C 4.8 - 5.6 % 7.6 (H)   Review of Glycemic Control  Diabetes history: DM2 Outpatient Diabetes medications: Lantus  20 units QAM, Tradjenta  5 mg daily Current orders for Inpatient glycemic control: Semglee  10 units daily, Novolog  0-15 units TID with meals, Novolog  3 units TID with meals  Inpatient Diabetes Program Recommendations:    HbgA1C: Please consider ordering an A1C to evaluate glycemic control over the past 2-3 months.  NOTE: Patient admitted from SNF on 03/28/24 after fall with HHS, dehydration, acute metabolic encephalopathy, and recurrent falls. Lab glucose 741 mg/dl on 87/72/74 at 84:40. Patient was ordered IV insulin  and has been transited to SQ insulin . CBG 184 mg/dl this morning. Agree with current insulin  orders. Will follow along  while inpatient.  Thanks, Earnie Gainer, RN, MSN, CDCES Diabetes Coordinator Inpatient Diabetes Program [phone] (Team Pager from 8am to 5pm)

## 2024-03-30 NOTE — Progress Notes (Signed)
 " Progress Note   Patient: [name] FMW:[id number] DOB: 1941/02/10 DOA: 03/28/2024  DOS: the patient was seen and examined on 03/30/2024   Brief hospital course:  83 y.o. male with medical history significant of hypertension, hyperlipidemia, atrial fibrillation on apixaban , CVA, type 2 diabetes melitis, GERD, OSA on CPAP who presents to the emergency via EMS from assisted living facility due to unwitnessed fall.    Assessment and Plan:   Acute metabolic encephalopathy/underlying dementia - Confused, intermittently delirious, calling out for help.  Answers some questions appropriately.  Continue to reorient.  Concern for sepsis (POA) with Klebsiella bacteremia - Tachycardic, encephalopathic, now showing Klebsiella bacteremia.  Likely from UTI source.  Was empirically on cefepime  plus vancomycin .  Will discontinue vancomycin , transition cefepime  to ceftriaxone  per susceptibilities.  Monitor blood pressure closely.   Hyperosmolar hyperglycemic state - Markedly elevated glucose, AKI on presentation.  Elevated osmolality.  Aggressive IV fluid hydration, insulin  drip initiated.  Subsequently showed remarkable recovery of glucose, transitioned to subcu insulin .  Appears resolved.  Correct Phos, K.   Uncontrolled insulin  dependent diabetes mellitus with hyperglycemia - Will order A1c.  Currently on insulin  glargine 10 units daily with insulin  sliding scale.  showing improvement.   urinary tract infection with Klebsiella pneumonia - Reported fever, UA noting LE, nitrites, WBCs.  Urine culture noting Klebsiella pneumonia with resistance to ampicillin and Macrobid.  Transition to empiric cefepime  to ceftriaxone  1 g every 24 hours.     Acute kidney injury - Creatinine elevated on presentation.  Showing continued improvement with aggressive IV fluid hydration.  Will recheck BMP in AM.   Physical debilitation muscle weakness/recurrent falls - PT/OT eval pending.   Persistent atrial  flutter/fibrillation - Noted a flutter on telemetry.  Eliquis , metoprolol  on board.   Goals of care - Will work with patient, family, TOC on disposition planning.  Anticipate discharge 1-2 days.   Subjective: Patient resting comfortably's morning.  Seems less confused, more at baseline.  Answering questions appropriately.  Denies any fever, chills, shortness of breath, pain, vomiting.  Blood pressure stable.  Physical Exam:  Vitals:   03/30/24 0400 03/30/24 0700 03/30/24 0755 03/30/24 1051  BP: 112/74 (!) 96/53  130/85  Pulse: 88 96    Resp: 12 13    Temp:   (!) 97.5 F (36.4 C)   TempSrc:   Axillary   SpO2: 100% 99%    Weight:      Height:        GENERAL:  Alert, pleasant, no acute distress, disheveled HEENT:  EOMI, poor dentition CARDIOVASCULAR: Irregularly irregular RESPIRATORY:  Clear to auscultation, no wheezing, rales, or rhonchi GASTROINTESTINAL:  Soft, nontender, nondistended EXTREMITIES:  No LE edema bilaterally NEURO:  No new focal deficits appreciated SKIN:  No rashes noted PSYCH:  Appropriate mood and affect    Data Reviewed:  Imaging Studies: CT Head Wo Contrast Result Date: 03/28/2024 EXAM: CT HEAD WITHOUT CONTRAST 03/28/2024 05:20:45 PM TECHNIQUE: CT of the head was performed without the administration of intravenous contrast. Automated exposure control, iterative reconstruction, and/or weight based adjustment of the mA/kV was utilized to reduce the radiation dose to as low as reasonably achievable. COMPARISON: 03/26/2024 CLINICAL HISTORY: Head trauma, minor (Age >= 65y) FINDINGS: BRAIN AND VENTRICLES: No acute hemorrhage. No evidence of acute infarct. Proportional prominence of ventricles and sulci, consistent with diffuse cerebral parenchymal volume loss. Confluent periventricular and subcortical white matter hypoattenuation, consistent with advanced chronic ischemic microvascular disease. Multifocal encephalomalacia in left frontal lobe and the left  parieto-occipital lobes. Remote lacunar infarcts in the right basal ganglia and right thalamus. No extra-axial collection. No mass effect or midline shift. ORBITS: Left lens replacement. SINUSES: No acute abnormality. SOFT TISSUES AND SKULL: No acute soft tissue abnormality. No skull fracture. VASCULATURE: Calcified atherosclerotic plaque in cavernous/supraclinoid ICA and intradural vertebral arteries. IMPRESSION: 1. No acute intracranial abnormality. 2. Extensive chronic microvascular ischemic changes and remote infarcts are similar to prior. Electronically signed by: Donnice Mania MD 03/28/2024 06:52 PM EST RP Workstation: [id number]   CT Cervical Spine Wo Contrast Result Date: 03/28/2024 EXAM: CT CERVICAL SPINE WITHOUT CONTRAST 03/28/2024 05:20:45 PM TECHNIQUE: CT of the cervical spine was performed without the administration of intravenous contrast. Multiplanar reformatted images are provided for review. Automated exposure control, iterative reconstruction, and/or weight based adjustment of the mA/kV was utilized to reduce the radiation dose to as low as reasonably achievable. COMPARISON: 03/26/2024 CLINICAL HISTORY: Neck trauma (Age >= 65y) FINDINGS: BONES AND ALIGNMENT: Similar appearance of prominence confluent enthesopathy from C2 through T4. There is disruption of anterior osteophytes at the C6-C7 level which is unchanged from prior. Redemonstrated nondisplaced transverse fracture through the bridging osteophytes anteriorly at C5-C6. There is no evidence of traumatic malalignment. DEGENERATIVE CHANGES: Degenerative endplate osteophytes and disc space narrowing at multiple levels. Disc osteophyte complexes at multiple levels. Spinal canal stenosis is most pronounced at C5-C6 where a right eccentric disc osteophyte complex results in cord compression. There is facet arthrosis and uncovertebral hypertrophy at multiple levels. Foraminal stenosis throughout the cervical spine similar to prior, most pronounced  on the left at C2-C3 and on the right at C5-C6. SOFT TISSUES: No prevertebral soft tissue swelling. Alignment of the atherosclerotic plaque at the carotid bifurcations. IMPRESSION: 1. Redemonstrated nondisplaced transverse fracture through the bridging osteophytes anteriorly at C5-C6. 2. No evidence of acute traumatic malalignment. 3. Degenerative changes are similar to prior. 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 Electronically signed by: Donnice Mania MD 03/28/2024 06:41 PM EST RP Workstation: [id number]   CT CHEST ABDOMEN PELVIS W CONTRAST Result Date: 03/28/2024 CLINICAL DATA:  Polytrauma, blunt.  Unwitnessed fall. EXAM: CT CHEST, ABDOMEN, AND PELVIS WITH CONTRAST TECHNIQUE: Multidetector CT imaging of the chest, abdomen and pelvis was performed following the standard protocol during bolus administration of intravenous contrast. RADIATION DOSE REDUCTION: This exam was performed according to the departmental dose-optimization program which includes automated exposure control, adjustment of the mA and/or kV according to patient size and/or use of iterative reconstruction technique. CONTRAST:  OMNIPAQUE  IOHEXOL  300 MG/ML  SOLN COMPARISON:  12/17/2023. FINDINGS: CT CHEST FINDINGS Cardiovascular: Heart is enlarged and there is a small pericardial effusion. Three-vessel coronary artery calcifications are noted. 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 Pulmonary trunk is distended suggesting underlying pulmonary artery hypertension. Mediastinum/Nodes: No mediastinal, hilar, or axillary lymphadenopathy. The thyroid  gland, trachea, and esophagus are within normal limits. Lungs/Pleura: Atelectasis is present bilaterally. No effusion or pneumothorax is seen. Musculoskeletal: Degenerative changes are present in the thoracic spine. No acute fracture is seen. CT ABDOMEN PELVIS FINDINGS Hepatobiliary: No  focal liver abnormality is seen. Stones are present within the gallbladder. No biliary ductal dilatation. Pancreas: Unremarkable. No pancreatic ductal dilatation or surrounding inflammatory changes. Spleen: Normal in size without focal abnormality. Adrenals/Urinary Tract: The adrenal glands are within normal limits. The kidneys enhance symmetrically. Scattered hypodensities are present in the kidneys bilaterally, the largest on the left is cystic in attenuation. No renal calculus or hydronephrosis  bilaterally. The bladder is unremarkable. Stomach/Bowel: The stomach is within normal limits. No bowel obstruction, free air, or pneumatosis is seen. Appendix appears normal. Vascular/Lymphatic: Aortic atherosclerosis. No enlarged abdominal or pelvic lymph nodes. Reproductive: Prostate gland is enlarged. Other: No abdominopelvic ascites. A fat containing inguinal hernias present on the left. Musculoskeletal: Degenerative changes are noted in the lumbar spine. Pars defects are present at L5 bilaterally with mild anterolisthesis at L5-S1. There is kyphosis of the upper lumbar spine. No acute fracture is seen. IMPRESSION: 1. No evidence of acute fracture or solid organ injury. 2. Cholelithiasis. 3. Enlarged prostate gland. 4. Coronary artery calcifications. 5. Aortic atherosclerosis. Electronically Signed   By: Leita Birmingham M.D.   On: 03/28/2024 18:41   DG FEMUR MIN 2 VIEWS LEFT Result Date: 03/28/2024 CLINICAL DATA:  Unwitnessed fall. EXAM: LEFT FEMUR 2 VIEWS COMPARISON:  None Available. FINDINGS: There is no evidence of fracture or other focal bone lesions. Knee arthroplasty is intact. Hip alignment is maintained with moderate degenerative change. Vascular calcifications. IMPRESSION: No acute fracture of the left femur. Electronically Signed   By: Andrea Gasman M.D.   On: 03/28/2024 17:47   CT CERVICAL SPINE WO CONTRAST Result Date: 03/26/2024 EXAM: CT HEAD, FACIAL BONES AND CERVICAL SPINE WITHOUT CONTRAST  03/26/2024 02:26:00 AM TECHNIQUE: CT of the head, facial bones and cervical spine was performed without the administration of intravenous contrast. Multiplanar reformatted images are provided for review. Automated exposure control, iterative reconstruction, and/or weight based adjustment of the mA/kV was utilized to reduce the radiation dose to as low as reasonably achievable. COMPARISON: Head, facial, and cervical spine CT studies all on 02/25/2024, cervical spine CT 12/17/2023. CLINICAL HISTORY: Head trauma, minor (Age >= 65y) FINDINGS: CT HEAD BRAIN AND VENTRICLES: No acute intracranial hemorrhage. No mass effect or midline shift. No extra-axial fluid collection. No evidence of acute infarct. There is encephalomalacia due to chronic infarcts in the left frontal lobe and left parieto-occipital area. 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 There is a chronic band infarct in the right thalamocapsular area, lacunar infarct in the left thalamus. The carotid siphons and distal vertebral arteries are heavily calcified. No hyperdense vessel is seen. SKULL AND SCALP: No acute skull fracture. No scalp hematoma. CT FACIAL BONES FACIAL BONES: No acute facial fracture. No mandibular dislocation. No suspicious bone lesion. There is advanced tooth decay involving the left mandibular 12-year molar, with root caries and periapical lucencies involving the right maxillary molar dentition. Follow up with a dentist is recommended. ORBITS: No acute traumatic injury. There is no acute orbital finding with prior left lens replacement once again. SINUSES AND MASTOIDS: There is mild mucosal thickening in the floor of the bilateral maxillary sinuses. The other sinuses, bilateral mastoid air cells, and middle ear are clear. SOFT TISSUES: No acute abnormality. No facial hematoma or mass is seen. CT CERVICAL SPINE BONES AND ALIGNMENT: There is extensive anterior bulky  bridging enthesopathy contiguous from C2 through T4. There is osteopenia. There is a nondisplaced transverse fracture through the bridging bone mantle again noted anteriorly at C5-C6, best seen on series 12 images 42 through 49 and series 10 images 27 through 29. This was present on 02/25/2024 but it was not present on 12/17/2023. There is no change in appearance of the fracture, no transmission into the adjacent vertebral bodies and posterior elements. No new or acute fracture is evident, and no pathologic focal bone lesion is identified. No traumatic malalignment.  DEGENERATIVE CHANGES: Partial disc space loss noted at all levels with calcifications in some of the discs, interbody ankylosis over portions of the disc spaces beginning at C3. Dorsal endplate osteophytes are present at most levels with variable cord encroachment, most significant at C5-C6 where there is a right lateralizing broad-based dorsal disc osteophyte complex compressing the cord on the right greater than left. There is multilevel facet hypertrophy and uncinate spurring. Acquired foraminal stenosis is greatest on the left at C2-C3, on the right at C3-C4 and C4-C5, and on the right greater than left at C5-C6. SOFT TISSUES: No prevertebral soft tissue swelling. There is atherosclerosis in the aortic arch and great vessels with no acute findings in the visualized upper chest. There is heavy calcification of the carotid bifurcations. No laryngeal mass is seen. No spinal canal hematoma. IMPRESSION: 1. No acute intracranial CT findings or depressed skull fractures. 2. Nondisplaced transverse fracture through the anterior bridging bone mantle at C5-C6, stable compared to 02/25/2024, was not present on 12/17/2023. 3. Extensive anterior bulky bridging enthesopathy. . 4. 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 5. Advanced tooth decay involving the left mandibular molar and periapical  lucencies involving the right maxillary molar dentition, and follow up with a dentist is recommended. 6. 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 7. No facial fracture is seen. 8. Chronic brain infarcts, heavy vascular calcifications. Electronically signed by: Francis Quam MD 03/26/2024 03:05 AM EST RP Workstation: [id number]   CT HEAD WO CONTRAST ( ) Result Date: 03/26/2024 EXAM: CT HEAD, FACIAL BONES AND CERVICAL SPINE WITHOUT CONTRAST 03/26/2024 02:26:00 AM TECHNIQUE: CT of the head, facial bones and cervical spine was performed without the administration of intravenous contrast. Multiplanar reformatted images are provided for review. Automated exposure control, iterative reconstruction, and/or weight based adjustment of the mA/kV was utilized to reduce the radiation dose to as low as reasonably achievable. COMPARISON: Head, facial, and cervical spine CT studies all on 02/25/2024, cervical spine CT 12/17/2023. CLINICAL HISTORY: Head trauma, minor (Age >= 65y) FINDINGS: CT HEAD BRAIN AND VENTRICLES: No acute intracranial hemorrhage. No mass effect or midline shift. No extra-axial fluid collection. No evidence of acute infarct. There is encephalomalacia due to chronic infarcts in the left frontal lobe and left parieto-occipital area. 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 There is a chronic band infarct in the right thalamocapsular area, lacunar infarct in the left thalamus. The carotid siphons and distal vertebral arteries are heavily calcified. No hyperdense vessel is seen. SKULL AND SCALP: No acute skull fracture. No scalp hematoma. CT FACIAL BONES FACIAL BONES: No acute facial fracture. No mandibular dislocation. No suspicious bone lesion. There is advanced tooth decay involving the left mandibular 12-year molar, with root caries and periapical lucencies involving the right maxillary molar dentition. Follow up  with a dentist is recommended. ORBITS: No acute traumatic injury. There is no acute orbital finding with prior left lens replacement once again. SINUSES AND MASTOIDS: There is mild mucosal thickening in the floor of the bilateral maxillary sinuses. The other sinuses, bilateral mastoid air cells, and middle ear are clear. SOFT TISSUES: No acute abnormality. No facial hematoma or mass is seen. CT CERVICAL SPINE BONES AND ALIGNMENT: There is extensive anterior bulky bridging enthesopathy contiguous from C2 through T4. There is osteopenia. There is a nondisplaced transverse fracture through the bridging bone mantle again noted anteriorly at C5-C6, best seen on  series 12 images 42 through 49 and series 10 images 27 through 29. This was present on 02/25/2024 but it was not present on 12/17/2023. There is no change in appearance of the fracture, no transmission into the adjacent vertebral bodies and posterior elements. No new or acute fracture is evident, and no pathologic focal bone lesion is identified. No traumatic malalignment. DEGENERATIVE CHANGES: Partial disc space loss noted at all levels with calcifications in some of the discs, interbody ankylosis over portions of the disc spaces beginning at C3. Dorsal endplate osteophytes are present at most levels with variable cord encroachment, most significant at C5-C6 where there is a right lateralizing broad-based dorsal disc osteophyte complex compressing the cord on the right greater than left. There is multilevel facet hypertrophy and uncinate spurring. Acquired foraminal stenosis is greatest on the left at C2-C3, on the right at C3-C4 and C4-C5, and on the right greater than left at C5-C6. SOFT TISSUES: No prevertebral soft tissue swelling. There is atherosclerosis in the aortic arch and great vessels with no acute findings in the visualized upper chest. There is heavy calcification of the carotid bifurcations. No laryngeal mass is seen. No spinal canal hematoma.  IMPRESSION: 1. No acute intracranial CT findings or depressed skull fractures. 2. Nondisplaced transverse fracture through the anterior bridging bone mantle at C5-C6, stable compared to 02/25/2024, was not present on 12/17/2023. 3. Extensive anterior bulky bridging enthesopathy. . 4. 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 5. Advanced tooth decay involving the left mandibular molar and periapical lucencies involving the right maxillary molar dentition, and follow up with a dentist is recommended. 6. 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 7. No facial fracture is seen. 8. Chronic brain infarcts, heavy vascular calcifications. Electronically signed by: Francis Quam MD 03/26/2024 03:05 AM EST RP Workstation: [id number]   CT Maxillofacial W Contrast Result Date: 03/26/2024 EXAM: CT HEAD, FACIAL BONES AND CERVICAL SPINE WITHOUT CONTRAST 03/26/2024 02:26:00 AM TECHNIQUE: CT of the head, facial bones and cervical spine was performed without the administration of intravenous contrast. Multiplanar reformatted images are provided for review. Automated exposure control, iterative reconstruction, and/or weight based adjustment of the mA/kV was utilized to reduce the radiation dose to as low as reasonably achievable. COMPARISON: Head, facial, and cervical spine CT studies all on 02/25/2024, cervical spine CT 12/17/2023. CLINICAL HISTORY: Head trauma, minor (Age >= 65y) FINDINGS: CT HEAD BRAIN AND VENTRICLES: No acute intracranial hemorrhage. No mass effect or midline shift. No extra-axial fluid collection. No evidence of acute infarct. There is encephalomalacia due to chronic infarcts in the left frontal lobe and left parieto-occipital area. 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 There is a chronic band infarct in the right thalamocapsular area, lacunar infarct in  the left thalamus. The carotid siphons and distal vertebral arteries are heavily calcified. No hyperdense vessel is seen. SKULL AND SCALP: No acute skull fracture. No scalp hematoma. CT FACIAL BONES FACIAL BONES: No acute facial fracture. No mandibular dislocation. No suspicious bone lesion. There is advanced tooth decay involving the left mandibular 12-year molar, with root caries and periapical lucencies involving the right maxillary molar dentition. Follow up with a dentist is recommended. ORBITS: No acute traumatic injury. There is no acute orbital finding with prior left lens replacement once again. SINUSES AND MASTOIDS: There is mild mucosal thickening in the floor of the bilateral maxillary sinuses. The other sinuses,  bilateral mastoid air cells, and middle ear are clear. SOFT TISSUES: No acute abnormality. No facial hematoma or mass is seen. CT CERVICAL SPINE BONES AND ALIGNMENT: There is extensive anterior bulky bridging enthesopathy contiguous from C2 through T4. There is osteopenia. There is a nondisplaced transverse fracture through the bridging bone mantle again noted anteriorly at C5-C6, best seen on series 12 images 42 through 49 and series 10 images 27 through 29. This was present on 02/25/2024 but it was not present on 12/17/2023. There is no change in appearance of the fracture, no transmission into the adjacent vertebral bodies and posterior elements. No new or acute fracture is evident, and no pathologic focal bone lesion is identified. No traumatic malalignment. DEGENERATIVE CHANGES: Partial disc space loss noted at all levels with calcifications in some of the discs, interbody ankylosis over portions of the disc spaces beginning at C3. Dorsal endplate osteophytes are present at most levels with variable cord encroachment, most significant at C5-C6 where there is a right lateralizing broad-based dorsal disc osteophyte complex compressing the cord on the right greater than left. There is  multilevel facet hypertrophy and uncinate spurring. Acquired foraminal stenosis is greatest on the left at C2-C3, on the right at C3-C4 and C4-C5, and on the right greater than left at C5-C6. SOFT TISSUES: No prevertebral soft tissue swelling. There is atherosclerosis in the aortic arch and great vessels with no acute findings in the visualized upper chest. There is heavy calcification of the carotid bifurcations. No laryngeal mass is seen. No spinal canal hematoma. IMPRESSION: 1. No acute intracranial CT findings or depressed skull fractures. 2. Nondisplaced transverse fracture through the anterior bridging bone mantle at C5-C6, stable compared to 02/25/2024, was not present on 12/17/2023. 3. Extensive anterior bulky bridging enthesopathy. . 4. 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 5. Advanced tooth decay involving the left mandibular molar and periapical lucencies involving the right maxillary molar dentition, and follow up with a dentist is recommended. 6. 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 7. No facial fracture is seen. 8. Chronic brain infarcts, heavy vascular calcifications. Electronically signed by: Francis Quam MD 03/26/2024 03:05 AM EST RP Workstation: [id number]    There are no new results to review at this time.  Previous records (including but not limited to H&P, progress notes, nursing notes, TOC management) were reviewed in assessment of this patient.  Labs: CBC: Recent Labs  Lab 03/25/24 2359 03/28/24 1559 03/28/24 1623 03/29/24 0649 03/30/24 0326  WBC 4.7 5.5  --  7.2 6.6  NEUTROABS  --  4.2  --   --   --   HGB 13.9 14.6 15.0 13.5 13.7  HCT 42.2 45.3 44.0 42.8 43.3  MCV 95.5 95.8  --  98.2 96.7  PLT 116* 127*  --  111* 111*   Basic Metabolic Panel: Recent Labs  Lab 03/28/24 1559 03/28/24 1623 03/28/24 1938 03/28/24 2132 03/29/24 0102 03/29/24 0649 03/30/24 0326  NA 143 144 148*  149* 148*  --  140  K 4.7 4.4 3.3* 3.6 4.1  --  3.9  CL 102 104 111 110 110  --  106  CO2 23  --  21* 30 30  --  28  GLUCOSE 741* >700* 408* 205* 258*  --  187*  BUN 17 18 15 15 15   --  23  CREATININE 1.34* 1.30* 1.23 1.22 1.17  --  1.12  CALCIUM  9.9  --  9.0 9.7 9.3  --  9.0  MG  --   --   --   --   --  2.3 2.1  PHOS  --   --   --   --   --  1.6* 2.3*   Liver Function Tests: Recent Labs  Lab 03/28/24 1559  AST 19  ALT 13  ALKPHOS 91  BILITOT 1.2  PROT 6.4*  ALBUMIN 3.8   CBG: Recent Labs  Lab 03/29/24 1111 03/29/24 1623 03/29/24 2120 03/30/24 0338 03/30/24 0719  GLUCAP 218* 126* 147* 225* 184*    Scheduled Meds:  apixaban   5 mg Oral BID   Chlorhexidine  Gluconate Cloth  6 each Topical Q0600   insulin  aspart  0-15 Units Subcutaneous TID WC   insulin  aspart  3 Units Subcutaneous TID WC   [START ON 03/31/2024] insulin  glargine  10 Units Subcutaneous Daily   metoprolol  tartrate  12.5 mg Oral BID   rosuvastatin   20 mg Oral Daily   terbinafine    Topical BID   Continuous Infusions:  cefTRIAXone  (ROCEPHIN )  IV     PRN Meds:.acetaminophen  **OR** acetaminophen , dextrose , ondansetron  **OR** ondansetron  (ZOFRAN ) IV  Family Communication: None at bedside  Disposition: Status is: Inpatient Remains inpatient appropriate because: UTI     Time spent: 41 minutes  Length of inpatient stay: 1 days  Author: Carliss LELON Canales, DO 03/30/2024 11:18 AM  For on call review [email or web address]   "

## 2024-03-30 NOTE — TOC Progression Note (Signed)
 Transition of Care [facility]) - Progression Note    Patient Details  Name: [name] MRN: [id number] Date of Birth: 26-Aug-1940  Transition [facility]) CM/SW Contact  [name], [place] Phone Number: 03/30/2024, 3:30 PM  Clinical Narrative:     CSW attempted to reach patient spouse and number is not in service. CSW then called daughter and reviewed facility bed choices for [facility]. CSW explained to daughter that [facility] not [facility] and [facility]. [facility]. ICM will continue to follow.   Expected Discharge Plan: Skilled Nursing Facility Barriers to Discharge: Continued Medical Work up       Expected Discharge Plan and Services     Post Acute Care Choice: Skilled Nursing Facility Living arrangements for the past 2 months: Assisted Living Facility ([facility], [place])                                       Social Drivers of Health (SDOH) Interventions SDOH Screenings   Food Insecurity: No Food Insecurity (03/29/2024)  Housing: Low Risk (03/29/2024)  Transportation Needs: No Transportation Needs (03/29/2024)  Utilities: Not At Risk (03/29/2024)  Social Connections: Patient Unable To Answer (03/29/2024)  Tobacco Use: Low Risk (03/28/2024)    Readmission Risk Interventions    03/30/2024    3:30 PM 03/29/2024    4:24 PM  Readmission Risk Prevention Plan  Transportation Screening Complete Complete  PCP or Specialist Appt within 3-5 Days  Complete  HRI or Home Care Consult Complete   Social Work Consult for Recovery Care Planning/Counseling Complete   Palliative Care Screening Not Applicable   Medication Review Oceanographer) Complete Complete

## 2024-03-31 DIAGNOSIS — I4819 Other persistent atrial fibrillation: Secondary | ICD-10-CM | POA: Diagnosis not present

## 2024-03-31 DIAGNOSIS — A498 Other bacterial infections of unspecified site: Secondary | ICD-10-CM

## 2024-03-31 DIAGNOSIS — A419 Sepsis, unspecified organism: Secondary | ICD-10-CM

## 2024-03-31 DIAGNOSIS — E1100 Type 2 diabetes mellitus with hyperosmolarity without nonketotic hyperglycemic-hyperosmolar coma (NKHHC): Secondary | ICD-10-CM | POA: Diagnosis not present

## 2024-03-31 DIAGNOSIS — G9341 Metabolic encephalopathy: Secondary | ICD-10-CM | POA: Diagnosis not present

## 2024-03-31 DIAGNOSIS — N179 Acute kidney failure, unspecified: Secondary | ICD-10-CM | POA: Diagnosis not present

## 2024-03-31 LAB — GLUCOSE, CAPILLARY
Glucose-Capillary: 122 mg/dL — ABNORMAL HIGH (ref 70–99)
Glucose-Capillary: 115 mg/dL — ABNORMAL HIGH (ref 70–99)
Glucose-Capillary: 93 mg/dL (ref 70–99)
Glucose-Capillary: 114 mg/dL — ABNORMAL HIGH (ref 70–99)
Glucose-Capillary: 195 mg/dL — ABNORMAL HIGH (ref 70–99)

## 2024-03-31 LAB — CBC
HCT: 40.2 % (ref 39.0–52.0)
Hemoglobin: 13 g/dL (ref 13.0–17.0)
MCH: 31 pg (ref 26.0–34.0)
MCHC: 32.3 g/dL (ref 30.0–36.0)
MCV: 95.7 fL (ref 80.0–100.0)
Platelets: 109 K/uL — ABNORMAL LOW (ref 150–400)
RBC: 4.2 MIL/uL — ABNORMAL LOW (ref 4.22–5.81)
RDW: 13.3 % (ref 11.5–15.5)
WBC: 5.7 K/uL (ref 4.0–10.5)
nRBC: 0 % (ref 0.0–0.2)

## 2024-03-31 LAB — BASIC METABOLIC PANEL WITH GFR
Anion gap: 4 — ABNORMAL LOW (ref 5–15)
BUN: 22 mg/dL (ref 8–23)
CO2: 30 mmol/L (ref 22–32)
Calcium: 8.3 mg/dL — ABNORMAL LOW (ref 8.9–10.3)
Chloride: 107 mmol/L (ref 98–111)
Creatinine, Ser: 0.89 mg/dL (ref 0.61–1.24)
GFR, Estimated: >60 mL/min
Glucose, Bld: 129 mg/dL — ABNORMAL HIGH (ref 70–99)
Potassium: 3.5 mmol/L (ref 3.5–5.1)
Sodium: 141 mmol/L (ref 135–145)

## 2024-03-31 LAB — CULTURE, BLOOD (ROUTINE X 2)

## 2024-03-31 MED ORDER — INSULIN ASPART 100 UNIT/ML IJ SOLN: 0.0000 [IU] | Freq: Every day | INTRAMUSCULAR | Status: DC

## 2024-03-31 MED ORDER — ENSURE PLUS HIGH PROTEIN PO LIQD
237.0000 mL | Freq: Two times a day (BID) | ORAL | Status: DC
  Administered 2024-04-01: 237 mL via ORAL

## 2024-03-31 MED ORDER — INSULIN ASPART 100 UNIT/ML IJ SOLN
0.0000 [IU] | Freq: Three times a day (TID) | INTRAMUSCULAR | Status: DC
  Administered 2024-04-01 (×2): 2 [IU] via SUBCUTANEOUS
  Filled 2024-03-31 (×2): qty 1

## 2024-03-31 NOTE — Progress Notes (Signed)
 Patient has been too drowsy all am to safely admin anything by mouth. Speech Therapy has been to eval, pt did not swallow applesauce admin'd by her. No oral meds given and pt has not eaten or drank anything. Pt will attempt to open eyes when name called and say, Yea, but right back to sleep. MD Arlon notified of pt condition and inalbility to admin meds. Order received to hold oral meds.

## 2024-03-31 NOTE — TOC Progression Note (Signed)
 Transition of Care [facility]) - Progression Note    Patient Details  Name: [name] MRN: [id number] Date of Birth: 22-Aug-1940  Transition of Care [facility]) CM/SW Contact  [name], [place] Phone Number: 03/31/2024, 2:39 PM  Clinical Narrative:     Patient shara is still pending , facility updated on DC for TMRW.   Expected Discharge Plan: Skilled Nursing Facility Barriers to Discharge: English As A Second Language Teacher, Continued Medical Work up       Expected Discharge Plan and Services     Post Acute Care Choice: Skilled Nursing Facility Living arrangements for the past 2 months: Assisted Living Facility ([facility], [place])                                       Social Drivers of Health (SDOH) Interventions SDOH Screenings   Food Insecurity: No Food Insecurity (03/29/2024)  Housing: Low Risk (03/29/2024)  Transportation Needs: No Transportation Needs (03/29/2024)  Utilities: Not At Risk (03/29/2024)  Social Connections: Patient Unable To Answer (03/29/2024)  Tobacco Use: Low Risk (03/28/2024)    Readmission Risk Interventions    03/31/2024    2:38 PM 03/30/2024    3:30 PM 03/29/2024    4:24 PM  Readmission Risk Prevention Plan  Transportation Screening Complete Complete Complete  PCP or Specialist Appt within 3-5 Days   Complete  HRI or Home Care Consult  Complete   Social Work Consult for Recovery Care Planning/Counseling  Complete   Palliative Care Screening  Not Applicable   Medication Review Oceanographer) Complete Complete Complete  HRI or Home Care Consult Complete    SW Recovery Care/Counseling Consult Complete    Palliative Care Screening Not Applicable    Skilled Nursing Facility Complete

## 2024-03-31 NOTE — Progress Notes (Signed)
 " Progress Note   Patient: [name] FMW:[id number] DOB: 06-30-1940 DOA: 03/28/2024  DOS: the patient was seen and examined on 03/31/2024   Brief hospital course:  83 y.o. male with medical history significant of hypertension, hyperlipidemia, atrial fibrillation on apixaban , CVA, type 2 diabetes melitis, GERD, OSA on CPAP who presents to the emergency via EMS from assisted living facility due to unwitnessed fall.    Assessment and Plan:   Acute metabolic encephalopathy/underlying dementia - Confused, intermittently delirious, calling out for help.  Answers some questions appropriately.  Continue to reorient.  Appears to be improving back to baseline.  Restarted patient's home Lexapro  and Seroquel .   Concern for sepsis (POA) with Klebsiella bacteremia - Tachycardic, encephalopathic, now showing Klebsiella bacteremia.  Likely from UTI source.  Was empirically on cefepime  plus vancomycin .  Discontinued vancomycin , transitioned cefepime  to ceftriaxone  per susceptibilities.  Monitor blood pressure closely.   Hyperosmolar hyperglycemic state - Markedly elevated glucose, AKI on presentation.  Elevated osmolality.  Aggressive IV fluid hydration, insulin  drip initiated.  Subsequently showed remarkable recovery of glucose, transitioned to subcu insulin .  Appears resolved.  Correct Phos, K.   Uncontrolled insulin  dependent diabetes mellitus with hyperglycemia - A1c 9.6 suggesting control.  Currently on insulin  glargine 10 units daily with insulin  sliding scale however now noting some low blood sugars.  Will reduce sliding scale to sensitive.     urinary tract infection with Klebsiella pneumonia - Reported fever, UA noting LE, nitrites, WBCs.  Urine culture noting Klebsiella pneumonia with resistance to ampicillin and Macrobid.  Transition to empiric cefepime  to ceftriaxone  2 g every 24 hours.  Can likely transition to p.o. medication regiment upon discharge.   Acute kidney injury - Creatinine  elevated on presentation up to 1.34.  Now improved to 0.89.  Showing continued improvement with aggressive IV fluid hydration.  Will recheck BMP in AM.   Physical debilitation muscle weakness/recurrent falls - PT/OT eval recommending SNF.   Persistent atrial flutter/fibrillation - Noted a flutter on telemetry.  Eliquis , metoprolol  on board.   Goals of care - Will work with patient, family, TOC on disposition planning.  [facility]ticip[facility] auth.  Subjective: Patient resting comfortably this morning.  Received Ativan  overnight due to some agitation.  No fevers, worsening shortness of breath, chest pain, nausea, vomiting, abdominal pain.  Physical Exam:  Vitals:   03/30/24 1707 03/30/24 2016 03/31/24 0047 03/31/24 0345  BP: (!) 116/91 (!) 141/99 123/75 (!) 141/84  Pulse: 87 (!) 101 70 88  Resp: 20 18 18 18   Temp: (!) 97.5 F (36.4 C) 97.8 F (36.6 C) (!) 97.5 F (36.4 C) 98 F (36.7 C)  TempSrc: Axillary Oral Oral Oral  SpO2: 99% 98% 94% 90%  Weight:      Height:        GENERAL:  Alert, pleasant, no acute distress, disheveled HEENT:  EOMI, poor dentition CARDIOVASCULAR: Irregularly irregular RESPIRATORY:  Clear to auscultation, no wheezing, rales, or rhonchi GASTROINTESTINAL:  Soft, nontender, nondistended EXTREMITIES:  No LE edema bilaterally NEURO:  No new focal deficits appreciated SKIN:  No rashes noted PSYCH:  Appropriate mood and affect    Data Reviewed:  Imaging Studies: CT Head Wo Contrast Result Date: 03/28/2024 EXAM: CT HEAD WITHOUT CONTRAST 03/28/2024 05:20:45 PM TECHNIQUE: CT of the head was performed without the administration of intravenous contrast. Automated exposure control, iterative reconstruction, and/or weight based adjustment of the mA/kV was utilized to reduce the radiation dose to as low as reasonably achievable.  COMPARISON: 03/26/2024 CLINICAL HISTORY: Head trauma, minor (Age >= 65y) FINDINGS: BRAIN AND VENTRICLES:  No acute hemorrhage. No evidence of acute infarct. Proportional prominence of ventricles and sulci, consistent with diffuse cerebral parenchymal volume loss. Confluent periventricular and subcortical white matter hypoattenuation, consistent with advanced chronic ischemic microvascular disease. Multifocal encephalomalacia in left frontal lobe and the left parieto-occipital lobes. Remote lacunar infarcts in the right basal ganglia and right thalamus. No extra-axial collection. No mass effect or midline shift. ORBITS: Left lens replacement. SINUSES: No acute abnormality. SOFT TISSUES AND SKULL: No acute soft tissue abnormality. No skull fracture. VASCULATURE: Calcified atherosclerotic plaque in cavernous/supraclinoid ICA and intradural vertebral arteries. IMPRESSION: 1. No acute intracranial abnormality. 2. Extensive chronic microvascular ischemic changes and remote infarcts are similar to prior. Electronically signed by: Donnice Mania MD 03/28/2024 06:52 PM EST RP Workstation: [id number]   CT Cervical Spine Wo Contrast Result Date: 03/28/2024 EXAM: CT CERVICAL SPINE WITHOUT CONTRAST 03/28/2024 05:20:45 PM TECHNIQUE: CT of the cervical spine was performed without the administration of intravenous contrast. Multiplanar reformatted images are provided for review. Automated exposure control, iterative reconstruction, and/or weight based adjustment of the mA/kV was utilized to reduce the radiation dose to as low as reasonably achievable. COMPARISON: 03/26/2024 CLINICAL HISTORY: Neck trauma (Age >= 65y) FINDINGS: BONES AND ALIGNMENT: Similar appearance of prominence confluent enthesopathy from C2 through T4. There is disruption of anterior osteophytes at the C6-C7 level which is unchanged from prior. Redemonstrated nondisplaced transverse fracture through the bridging osteophytes anteriorly at C5-C6. There is no evidence of traumatic malalignment. DEGENERATIVE CHANGES: Degenerative endplate osteophytes and disc space  narrowing at multiple levels. Disc osteophyte complexes at multiple levels. Spinal canal stenosis is most pronounced at C5-C6 where a right eccentric disc osteophyte complex results in cord compression. There is facet arthrosis and uncovertebral hypertrophy at multiple levels. Foraminal stenosis throughout the cervical spine similar to prior, most pronounced on the left at C2-C3 and on the right at C5-C6. SOFT TISSUES: No prevertebral soft tissue swelling. Alignment of the atherosclerotic plaque at the carotid bifurcations. IMPRESSION: 1. Redemonstrated nondisplaced transverse fracture through the bridging osteophytes anteriorly at C5-C6. 2. No evidence of acute traumatic malalignment. 3. Degenerative changes are similar to prior. 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 Electronically signed by: Donnice Mania MD 03/28/2024 06:41 PM EST RP Workstation: [id number]   CT CHEST ABDOMEN PELVIS W CONTRAST Result Date: 03/28/2024 CLINICAL DATA:  Polytrauma, blunt.  Unwitnessed fall. EXAM: CT CHEST, ABDOMEN, AND PELVIS WITH CONTRAST TECHNIQUE: Multidetector CT imaging of the chest, abdomen and pelvis was performed following the standard protocol during bolus administration of intravenous contrast. RADIATION DOSE REDUCTION: This exam was performed according to the departmental dose-optimization program which includes automated exposure control, adjustment of the mA and/or kV according to patient size and/or use of iterative reconstruction technique. CONTRAST:  OMNIPAQUE  IOHEXOL  300 MG/ML  SOLN COMPARISON:  12/17/2023. FINDINGS: CT CHEST FINDINGS Cardiovascular: Heart is enlarged and there is a small pericardial effusion. Three-vessel coronary artery calcifications are noted. 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 Pulmonary trunk is distended suggesting underlying pulmonary artery hypertension.  Mediastinum/Nodes: No mediastinal, hilar, or axillary lymphadenopathy. The thyroid  gland, trachea, and esophagus are within normal limits. Lungs/Pleura: Atelectasis is present bilaterally. No effusion or pneumothorax is seen. Musculoskeletal: Degenerative changes are present in the thoracic spine. No acute fracture is seen. CT ABDOMEN PELVIS FINDINGS Hepatobiliary: No focal liver abnormality is seen. Stones are present  within the gallbladder. No biliary ductal dilatation. Pancreas: Unremarkable. No pancreatic ductal dilatation or surrounding inflammatory changes. Spleen: Normal in size without focal abnormality. Adrenals/Urinary Tract: The adrenal glands are within normal limits. The kidneys enhance symmetrically. Scattered hypodensities are present in the kidneys bilaterally, the largest on the left is cystic in attenuation. No renal calculus or hydronephrosis bilaterally. The bladder is unremarkable. Stomach/Bowel: The stomach is within normal limits. No bowel obstruction, free air, or pneumatosis is seen. Appendix appears normal. Vascular/Lymphatic: Aortic atherosclerosis. No enlarged abdominal or pelvic lymph nodes. Reproductive: Prostate gland is enlarged. Other: No abdominopelvic ascites. A fat containing inguinal hernias present on the left. Musculoskeletal: Degenerative changes are noted in the lumbar spine. Pars defects are present at L5 bilaterally with mild anterolisthesis at L5-S1. There is kyphosis of the upper lumbar spine. No acute fracture is seen. IMPRESSION: 1. No evidence of acute fracture or solid organ injury. 2. Cholelithiasis. 3. Enlarged prostate gland. 4. Coronary artery calcifications. 5. Aortic atherosclerosis. Electronically Signed   By: Leita Birmingham M.D.   On: 03/28/2024 18:41   DG FEMUR MIN 2 VIEWS LEFT Result Date: 03/28/2024 CLINICAL DATA:  Unwitnessed fall. EXAM: LEFT FEMUR 2 VIEWS COMPARISON:  None Available. FINDINGS: There is no evidence of fracture or other focal bone  lesions. Knee arthroplasty is intact. Hip alignment is maintained with moderate degenerative change. Vascular calcifications. IMPRESSION: No acute fracture of the left femur. Electronically Signed   By: Andrea Gasman M.D.   On: 03/28/2024 17:47   CT CERVICAL SPINE WO CONTRAST Result Date: 03/26/2024 EXAM: CT HEAD, FACIAL BONES AND CERVICAL SPINE WITHOUT CONTRAST 03/26/2024 02:26:00 AM TECHNIQUE: CT of the head, facial bones and cervical spine was performed without the administration of intravenous contrast. Multiplanar reformatted images are provided for review. Automated exposure control, iterative reconstruction, and/or weight based adjustment of the mA/kV was utilized to reduce the radiation dose to as low as reasonably achievable. COMPARISON: Head, facial, and cervical spine CT studies all on 02/25/2024, cervical spine CT 12/17/2023. CLINICAL HISTORY: Head trauma, minor (Age >= 65y) FINDINGS: CT HEAD BRAIN AND VENTRICLES: No acute intracranial hemorrhage. No mass effect or midline shift. No extra-axial fluid collection. No evidence of acute infarct. There is encephalomalacia due to chronic infarcts in the left frontal lobe and left parieto-occipital area. 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 There is a chronic band infarct in the right thalamocapsular area, lacunar infarct in the left thalamus. The carotid siphons and distal vertebral arteries are heavily calcified. No hyperdense vessel is seen. SKULL AND SCALP: No acute skull fracture. No scalp hematoma. CT FACIAL BONES FACIAL BONES: No acute facial fracture. No mandibular dislocation. No suspicious bone lesion. There is advanced tooth decay involving the left mandibular 12-year molar, with root caries and periapical lucencies involving the right maxillary molar dentition. Follow up with a dentist is recommended. ORBITS: No acute traumatic injury. There is no acute orbital  finding with prior left lens replacement once again. SINUSES AND MASTOIDS: There is mild mucosal thickening in the floor of the bilateral maxillary sinuses. The other sinuses, bilateral mastoid air cells, and middle ear are clear. SOFT TISSUES: No acute abnormality. No facial hematoma or mass is seen. CT CERVICAL SPINE BONES AND ALIGNMENT: There is extensive anterior bulky bridging enthesopathy contiguous from C2 through T4. There is osteopenia. There is a nondisplaced transverse fracture through the bridging bone mantle again noted anteriorly at C5-C6, best seen on series 12 images  42 through 49 and series 10 images 27 through 29. This was present on 02/25/2024 but it was not present on 12/17/2023. There is no change in appearance of the fracture, no transmission into the adjacent vertebral bodies and posterior elements. No new or acute fracture is evident, and no pathologic focal bone lesion is identified. No traumatic malalignment. DEGENERATIVE CHANGES: Partial disc space loss noted at all levels with calcifications in some of the discs, interbody ankylosis over portions of the disc spaces beginning at C3. Dorsal endplate osteophytes are present at most levels with variable cord encroachment, most significant at C5-C6 where there is a right lateralizing broad-based dorsal disc osteophyte complex compressing the cord on the right greater than left. There is multilevel facet hypertrophy and uncinate spurring. Acquired foraminal stenosis is greatest on the left at C2-C3, on the right at C3-C4 and C4-C5, and on the right greater than left at C5-C6. SOFT TISSUES: No prevertebral soft tissue swelling. There is atherosclerosis in the aortic arch and great vessels with no acute findings in the visualized upper chest. There is heavy calcification of the carotid bifurcations. No laryngeal mass is seen. No spinal canal hematoma. IMPRESSION: 1. No acute intracranial CT findings or depressed skull fractures. 2. Nondisplaced  transverse fracture through the anterior bridging bone mantle at C5-C6, stable compared to 02/25/2024, was not present on 12/17/2023. 3. Extensive anterior bulky bridging enthesopathy. . 4. 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 5. Advanced tooth decay involving the left mandibular molar and periapical lucencies involving the right maxillary molar dentition, and follow up with a dentist is recommended. 6. 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 7. No facial fracture is seen. 8. Chronic brain infarcts, heavy vascular calcifications. Electronically signed by: Francis Quam MD 03/26/2024 03:05 AM EST RP Workstation: [id number]   CT HEAD WO CONTRAST ( ) Result Date: 03/26/2024 EXAM: CT HEAD, FACIAL BONES AND CERVICAL SPINE WITHOUT CONTRAST 03/26/2024 02:26:00 AM TECHNIQUE: CT of the head, facial bones and cervical spine was performed without the administration of intravenous contrast. Multiplanar reformatted images are provided for review. Automated exposure control, iterative reconstruction, and/or weight based adjustment of the mA/kV was utilized to reduce the radiation dose to as low as reasonably achievable. COMPARISON: Head, facial, and cervical spine CT studies all on 02/25/2024, cervical spine CT 12/17/2023. CLINICAL HISTORY: Head trauma, minor (Age >= 65y) FINDINGS: CT HEAD BRAIN AND VENTRICLES: No acute intracranial hemorrhage. No mass effect or midline shift. No extra-axial fluid collection. No evidence of acute infarct. There is encephalomalacia due to chronic infarcts in the left frontal lobe and left parieto-occipital area. 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 There is a chronic band infarct in the right thalamocapsular area, lacunar infarct in the left thalamus. The carotid siphons and distal vertebral arteries are heavily calcified. No  hyperdense vessel is seen. SKULL AND SCALP: No acute skull fracture. No scalp hematoma. CT FACIAL BONES FACIAL BONES: No acute facial fracture. No mandibular dislocation. No suspicious bone lesion. There is advanced tooth decay involving the left mandibular 12-year molar, with root caries and periapical lucencies involving the right maxillary molar dentition. Follow up with a dentist is recommended. ORBITS: No acute traumatic injury. There is no acute orbital finding with prior left lens replacement once again. SINUSES AND MASTOIDS: There is mild mucosal thickening in the floor of the bilateral maxillary sinuses. The other sinuses, bilateral mastoid  air cells, and middle ear are clear. SOFT TISSUES: No acute abnormality. No facial hematoma or mass is seen. CT CERVICAL SPINE BONES AND ALIGNMENT: There is extensive anterior bulky bridging enthesopathy contiguous from C2 through T4. There is osteopenia. There is a nondisplaced transverse fracture through the bridging bone mantle again noted anteriorly at C5-C6, best seen on series 12 images 42 through 49 and series 10 images 27 through 29. This was present on 02/25/2024 but it was not present on 12/17/2023. There is no change in appearance of the fracture, no transmission into the adjacent vertebral bodies and posterior elements. No new or acute fracture is evident, and no pathologic focal bone lesion is identified. No traumatic malalignment. DEGENERATIVE CHANGES: Partial disc space loss noted at all levels with calcifications in some of the discs, interbody ankylosis over portions of the disc spaces beginning at C3. Dorsal endplate osteophytes are present at most levels with variable cord encroachment, most significant at C5-C6 where there is a right lateralizing broad-based dorsal disc osteophyte complex compressing the cord on the right greater than left. There is multilevel facet hypertrophy and uncinate spurring. Acquired foraminal stenosis is greatest on the left  at C2-C3, on the right at C3-C4 and C4-C5, and on the right greater than left at C5-C6. SOFT TISSUES: No prevertebral soft tissue swelling. There is atherosclerosis in the aortic arch and great vessels with no acute findings in the visualized upper chest. There is heavy calcification of the carotid bifurcations. No laryngeal mass is seen. No spinal canal hematoma. IMPRESSION: 1. No acute intracranial CT findings or depressed skull fractures. 2. Nondisplaced transverse fracture through the anterior bridging bone mantle at C5-C6, stable compared to 02/25/2024, was not present on 12/17/2023. 3. Extensive anterior bulky bridging enthesopathy. . 4. 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 5. Advanced tooth decay involving the left mandibular molar and periapical lucencies involving the right maxillary molar dentition, and follow up with a dentist is recommended. 6. 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 7. No facial fracture is seen. 8. Chronic brain infarcts, heavy vascular calcifications. Electronically signed by: Francis Quam MD 03/26/2024 03:05 AM EST RP Workstation: [id number]   CT Maxillofacial W Contrast Result Date: 03/26/2024 EXAM: CT HEAD, FACIAL BONES AND CERVICAL SPINE WITHOUT CONTRAST 03/26/2024 02:26:00 AM TECHNIQUE: CT of the head, facial bones and cervical spine was performed without the administration of intravenous contrast. Multiplanar reformatted images are provided for review. Automated exposure control, iterative reconstruction, and/or weight based adjustment of the mA/kV was utilized to reduce the radiation dose to as low as reasonably achievable. COMPARISON: Head, facial, and cervical spine CT studies all on 02/25/2024, cervical spine CT 12/17/2023. CLINICAL HISTORY: Head trauma, minor (Age >= 65y) FINDINGS: CT HEAD BRAIN AND VENTRICLES: No acute intracranial hemorrhage. No mass effect or midline shift. No  extra-axial fluid collection. No evidence of acute infarct. There is encephalomalacia due to chronic infarcts in the left frontal lobe and left parieto-occipital area. 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 There is a chronic band infarct in the right thalamocapsular area, lacunar infarct in the left thalamus. The carotid siphons and distal vertebral arteries are heavily calcified. No hyperdense vessel is seen. SKULL AND SCALP: No acute skull fracture. No scalp hematoma. CT FACIAL BONES FACIAL BONES: No acute facial fracture. No mandibular dislocation. No suspicious bone lesion. There is advanced tooth decay involving the left  mandibular 12-year molar, with root caries and periapical lucencies involving the right maxillary molar dentition. Follow up with a dentist is recommended. ORBITS: No acute traumatic injury. There is no acute orbital finding with prior left lens replacement once again. SINUSES AND MASTOIDS: There is mild mucosal thickening in the floor of the bilateral maxillary sinuses. The other sinuses, bilateral mastoid air cells, and middle ear are clear. SOFT TISSUES: No acute abnormality. No facial hematoma or mass is seen. CT CERVICAL SPINE BONES AND ALIGNMENT: There is extensive anterior bulky bridging enthesopathy contiguous from C2 through T4. There is osteopenia. There is a nondisplaced transverse fracture through the bridging bone mantle again noted anteriorly at C5-C6, best seen on series 12 images 42 through 49 and series 10 images 27 through 29. This was present on 02/25/2024 but it was not present on 12/17/2023. There is no change in appearance of the fracture, no transmission into the adjacent vertebral bodies and posterior elements. No new or acute fracture is evident, and no pathologic focal bone lesion is identified. No traumatic malalignment. DEGENERATIVE CHANGES: Partial disc space loss noted at all levels with  calcifications in some of the discs, interbody ankylosis over portions of the disc spaces beginning at C3. Dorsal endplate osteophytes are present at most levels with variable cord encroachment, most significant at C5-C6 where there is a right lateralizing broad-based dorsal disc osteophyte complex compressing the cord on the right greater than left. There is multilevel facet hypertrophy and uncinate spurring. Acquired foraminal stenosis is greatest on the left at C2-C3, on the right at C3-C4 and C4-C5, and on the right greater than left at C5-C6. SOFT TISSUES: No prevertebral soft tissue swelling. There is atherosclerosis in the aortic arch and great vessels with no acute findings in the visualized upper chest. There is heavy calcification of the carotid bifurcations. No laryngeal mass is seen. No spinal canal hematoma. IMPRESSION: 1. No acute intracranial CT findings or depressed skull fractures. 2. Nondisplaced transverse fracture through the anterior bridging bone mantle at C5-C6, stable compared to 02/25/2024, was not present on 12/17/2023. 3. Extensive anterior bulky bridging enthesopathy. . 4. 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 5. Advanced tooth decay involving the left mandibular molar and periapical lucencies involving the right maxillary molar dentition, and follow up with a dentist is recommended. 6. 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 7. No facial fracture is seen. 8. Chronic brain infarcts, heavy vascular calcifications. Electronically signed by: Francis Quam MD 03/26/2024 03:05 AM EST RP Workstation: [id number]    There are no new results to review at this time.  Previous records (including but not limited to H&P, progress notes, nursing notes, TOC management) were reviewed in assessment of this patient.  Labs: CBC: Recent Labs  Lab 03/25/24 2359 03/28/24 1559 03/28/24 1623 03/29/24 0649  03/30/24 0326 03/31/24 0422  WBC 4.7 5.5  --  7.2 6.6 5.7  NEUTROABS  --  4.2  --   --   --   --   HGB 13.9 14.6 15.0 13.5 13.7 13.0  HCT 42.2 45.3 44.0 42.8 43.3 40.2  MCV 95.5 95.8  --  98.2 96.7 95.7  PLT 116* 127*  --  111* 111* 109*   Basic Metabolic Panel: Recent Labs  Lab 03/28/24 1938 03/28/24 2132 03/29/24 0102 03/29/24 0649 03/30/24 0326 03/31/24 0422  NA 148* 149* 148*  --  140 141  K 3.3* 3.6 4.1  --  3.9 3.5  CL 111 110 110  --  106 107  CO2 21* 30 30  --  28 30  GLUCOSE 408* 205* 258*  --  187* 129*  BUN 15 15 15   --  23 22  CREATININE 1.23 1.22 1.17  --  1.12 0.89  CALCIUM  9.0 9.7 9.3  --  9.0 8.3*  MG  --   --   --  2.3 2.1  --   PHOS  --   --   --  1.6* 2.3*  --    Liver Function Tests: Recent Labs  Lab 03/28/24 1559  AST 19  ALT 13  ALKPHOS 91  BILITOT 1.2  PROT 6.4*  ALBUMIN 3.8   CBG: Recent Labs  Lab 03/30/24 1610 03/30/24 2116 03/30/24 2149 03/31/24 0445 03/31/24 0729  GLUCAP 83 52* 147* 122* 115*    Scheduled Meds:  apixaban   5 mg Oral BID   Chlorhexidine  Gluconate Cloth  6 each Topical Q0600   escitalopram   10 mg Oral Daily   furosemide   20 mg Oral q morning   insulin  aspart  0-5 Units Subcutaneous QHS   insulin  aspart  0-9 Units Subcutaneous TID WC   insulin  glargine  10 Units Subcutaneous Daily   metoprolol  tartrate  12.5 mg Oral BID   oxybutynin   10 mg Oral QHS   QUEtiapine   12.5 mg Oral q morning   rosuvastatin   20 mg Oral Daily   terbinafine    Topical BID   traZODone   100 mg Oral QHS   Continuous Infusions:  cefTRIAXone  (ROCEPHIN )  IV Stopped (03/30/24 1638)   PRN Meds:.acetaminophen  **OR** acetaminophen , dextrose , ondansetron  **OR** ondansetron  (ZOFRAN ) IV  Family Communication: None at bedside  Disposition: Status is: Inpatient Remains inpatient appropriate because: Sepsis, Klebsiella UTI     Time spent: 40 minutes  Length of inpatient stay: 2 days  Author: Carliss LELON Canales, DO 03/31/2024 10:59  AM  For on call review [email or web address].   "

## 2024-03-31 NOTE — Progress Notes (Signed)
 Physical Therapy Treatment Patient Details Name: [name] MRN: [id number] DOB: 03/23/1941 Today's Date: 03/31/2024   History of Present Illness [name] is a 83 y.o. male with medical history significant of hypertension, hyperlipidemia, atrial fibrillation on apixaban , CVA, type 2 diabetes melitis, GERD, OSA on CPAP who presents to the emergency via EMS from assisted living facility due to unwitnessed fall.  At bedside, patient was unable to provide history possibly due to underlying dementia.     He presented to the ED on 12/24 due to an unwitnessed fall where he sustained a skin tear to the right elbow and bleeding from mouth suspected to be due to either injury or possibly even a mass.  CT maxillofacial done showed no specific findings.  He was hyperglycemic, IV fluids and insulin  drip was given, blood sugar improved to 250 and patient was discharged back to assisted living facility.    PT Comments  Patient appears agreeable to OT/PT co-treatment. Patient is very lethargic throughout session. At times, patient is able to follow commands but overall inconsistent throughout session. Alertness somewhat improves with mobility. Patient requires mod/max throughout session with bed mobility, transfers and while taking steps at bedside with RW. Once seated in chair, attempted LE strengthening. Pt able to perform LE strengthening inconsistently as he falls asleep throughout. AAROM completed to combat stiff joints. Pt left with OT staff to complete UE ROM/strengthening exercises. Patient will benefit from continued skilled physical therapy acutely and in recommended venue in order to address current deficits.     If plan is discharge home, recommend the following: A lot of help with walking and/or transfers;A little help with bathing/dressing/bathroom;Supervision due to cognitive status;Assist for transportation   Can travel by private vehicle        Equipment Recommendations  None recommended by  PT    Recommendations for Other Services       Precautions / Restrictions Precautions Precautions: Fall Recall of Precautions/Restrictions: Impaired Restrictions Weight Bearing Restrictions Per Provider Order: No     Mobility  Bed Mobility Overal bed mobility: Needs Assistance Bed Mobility: Supine to Sit     Supine to sit: Max assist, HOB elevated     General bed mobility comments: max assist with assisting BLE to EOB and trunk elevation from [facility], pt very lethargic and follows verbal commands inconsistently, very slow labored movement    Transfers Overall transfer level: Needs assistance Equipment used: Rolling walker (2 wheels) Transfers: Sit to/from Stand, Bed to chair/wheelchair/BSC Sit to Stand: Mod assist   Step pivot transfers: Mod assist, Max assist       General transfer comment: mod assist x1 for STS from EOB using RW, verbal and tactile cueing for pt to keep grip on RW throughout, assist with managing RW and LE advancement during step pivot from bed to chair    Ambulation/Gait Ambulation/Gait assistance: Mod assist, Max assist Gait Distance (Feet): 3 Feet Assistive device: Rolling walker (2 wheels) Gait Pattern/deviations: Step-to pattern, Decreased step length - right, Decreased step length - left, Decreased stride length, Knees buckling, Trunk flexed Gait velocity: Dec     General Gait Details: pt limited to a few side steps at bedside with RW and mod/max assist with managing RW and LLE advancement at times, pt very lethargic throughout and inconsistently following cues, limited most due to this   Stairs             Wheelchair Mobility     Tilt Bed    Modified Rankin (  Stroke Patients Only)       Balance Overall balance assessment: Needs assistance Sitting-balance support: Feet supported, Bilateral upper extremity supported Sitting balance-Leahy Scale: Poor Sitting balance - Comments: Seated EOB, forward lean at times   Standing  balance support: Bilateral upper extremity supported, During functional activity, Reliant on assistive device for balance Standing balance-Leahy Scale: Poor Standing balance comment: using RW              Communication Communication Communication: Impaired Factors Affecting Communication: Difficulty expressing self  Cognition Arousal: Lethargic Behavior During Therapy: Flat affect   PT - Cognitive impairments: History of cognitive impairments   Following commands: Impaired Following commands impaired: Follows one step commands inconsistently    Cueing Cueing Techniques: Verbal cues, Tactile cues, Visual cues  Exercises General Exercises - Lower Extremity Ankle Circles/Pumps: AROM, AAROM, Strengthening, Both, 5 reps, Seated Long Arc Quad: AROM, AAROM, Strengthening, Both, 5 reps, Seated    General Comments        Pertinent Vitals/Pain Pain Assessment Pain Assessment: Faces Faces Pain Scale: Hurts a little bit Pain Location: during mobility Pain Descriptors / Indicators: Grimacing, Moaning Pain Intervention(s): Limited activity within patient's tolerance, Monitored during session, Repositioned    Home Living                          Prior Function            PT Goals (current goals can now be found in the care plan section) Acute Rehab PT Goals Patient Stated Goal: pt unable to verbalize goals this date, would recommend rehab for pt PT Goal Formulation: With patient Time For Goal Achievement: 04/12/24 Potential to Achieve Goals: Good Progress towards PT goals: Progressing toward goals    Frequency    Min 3X/week      PT Plan      Co-evaluation PT/OT/SLP Co-Evaluation/Treatment: Yes Reason for Co-Treatment: To address functional/ADL transfers PT goals addressed during session: Mobility/safety with mobility OT goals addressed during session: ADL's and self-care      AM-PAC PT 6 Clicks Mobility   Outcome Measure  Help needed turning  from your back to your side while in a flat bed without using bedrails?: A Lot Help needed moving from lying on your back to sitting on the side of a flat bed without using bedrails?: A Lot Help needed moving to and from a bed to a chair (including a wheelchair)?: A Lot Help needed standing up from a chair using your arms (e.g., wheelchair or bedside chair)?: A Lot Help needed to walk in hospital room?: A Lot Help needed climbing 3-5 steps with a railing? : A Lot 6 Click Score: 12    End of Session Equipment Utilized During Treatment: Gait belt Activity Tolerance: Patient limited by lethargy Patient left: in chair;with call bell/phone within reach;with chair alarm set;Other (comment) (Left with OT) Nurse Communication: Mobility status PT Visit Diagnosis: Unsteadiness on feet (R26.81);Other abnormalities of gait and mobility (R26.89);Repeated falls (R29.6);Muscle weakness (generalized) (M62.81);Difficulty in walking, not elsewhere classified (R26.2)     Time: 8591-8578 PT Time Calculation (min) (ACUTE ONLY): 13 min  Charges:    $Gait Training: 8-22 mins PT General Charges $$ ACUTE PT VISIT: 1 Visit                     3:59 PM, 03/31/2024 Rosaria Settler, PT, DPT [place] with [facility]

## 2024-03-31 NOTE — Progress Notes (Signed)
 Occupational Therapy Treatment Patient Details Name: [name] MRN: [id number] DOB: 12-Sep-1940 Today's Date: 03/31/2024   History of present illness [name] is a 83 y.o. male with medical history significant of hypertension, hyperlipidemia, atrial fibrillation on apixaban , CVA, type 2 diabetes melitis, GERD, OSA on CPAP who presents to the emergency via EMS from assisted living facility due to unwitnessed fall.  At bedside, patient was unable to provide history possibly due to underlying dementia.     He presented to the ED on 12/24 due to an unwitnessed fall where he sustained a skin tear to the right elbow and bleeding from mouth suspected to be due to either injury or possibly even a mass.  CT maxillofacial done showed no specific findings.  He was hyperglycemic, IV fluids and insulin  drip was given, blood sugar improved to 250 and patient was discharged back to assisted living facility. (per DO)   OT comments  Pt agreeable to OT and PT co-treatment. Pt very lethargic with intermittent moments of increased arousal mostly with assist to sit and tactile cuing for movement. Pt required max A for supine to sit bed mobility with poor sitting balance at the EOB. Pt did show improvement in transferring from yesterday with mod A using RW to transfer to the chair. Pt then tolerated AA/ROM of B UE shoulders with near full range in R shoulder, but limited to ~75% of available range with L shoulder for flexion and abduction. Pt also briefly attempted to wash his face in the chair but required assist to complete. Pt left in the chair with call bell within reach and chair alarm set. Pt will benefit from continued OT in the hospital to increase strength, balance, and endurance for safe ADL's.         If plan is discharge home, recommend the following:  A lot of help with walking and/or transfers;A lot of help with bathing/dressing/bathroom;Assistance with cooking/housework;Two people to help with  bathing/dressing/bathroom;Assistance with feeding;Direct supervision/assist for medications management;Assist for transportation;Help with stairs or ramp for entrance;Supervision due to cognitive status   Equipment Recommendations  None recommended by OT          Precautions / Restrictions Precautions Precautions: Fall Recall of Precautions/Restrictions: Impaired Restrictions Weight Bearing Restrictions Per Provider Order: No       Mobility Bed Mobility Overal bed mobility: Needs Assistance Bed Mobility: Supine to Sit     Supine to sit: Max assist, HOB elevated     General bed mobility comments: Much assist for trunk control and to move B LE laterally to EOB.    Transfers Overall transfer level: Needs assistance Equipment used: Rolling walker (2 wheels) Transfers: Sit to/from Stand, Bed to chair/wheelchair/BSC Sit to Stand: Mod assist     Step pivot transfers: Mod assist     General transfer comment: EOB to chair with RW; assist to step with L LE to chair; assist to manage RW.     Balance Overall balance assessment: Needs assistance Sitting-balance support: Feet supported, Bilateral upper extremity supported Sitting balance-Leahy Scale: Poor Sitting balance - Comments: Seated EOB   Standing balance support: Bilateral upper extremity supported, During functional activity, Reliant on assistive device for balance Standing balance-Leahy Scale: Poor Standing balance comment: using RW                           ADL either performed or assessed with clinical judgement   ADL Overall ADL's : Needs assistance/impaired  Grooming: Sitting;Moderate assistance;Maximal assistance;Wash/dry face Grooming Details (indicate cue type and reason): Pt partially wiped his face with the wet washcloth while seated, but for only a couple seconds before seeming to fatigue. Assist to more thoroughly clean his face.               Lower Body Dressing Details (indicate  cue type and reason): Pt instructed to attempt to doff socks but only rubbed his feet together when asked.                     Communication Communication Communication: Impaired Factors Affecting Communication: Difficulty expressing self (Slow rate of speech today.)   Cognition Arousal: Lethargic Behavior During Therapy: Flat affect Cognition: Cognition impaired, No family/caregiver present to determine baseline             OT - Cognition Comments: Pt very lethargic today. Able to wake periodically with active assisted movement.                 Following commands: Impaired Following commands impaired: Follows one step commands inconsistently      Cueing   Cueing Techniques: Verbal cues, Tactile cues, Visual cues  Exercises Exercises: General Upper Extremity General Exercises - Upper Extremity Shoulder Flexion: AAROM, Both, 5 reps, Seated Shoulder ABduction: AAROM, 5 reps, Both, Seated Shoulder Horizontal ABduction: AAROM, Both, 5 reps, Seated (x5 protraction)                 Pertinent Vitals/ Pain       Pain Assessment Pain Assessment: Faces Faces Pain Scale: Hurts a little bit Pain Location: Shoulders with AA/ROM Pain Descriptors / Indicators: Grimacing Pain Intervention(s): Limited activity within patient's tolerance, Monitored during session, Repositioned                                                          Frequency  Min 2X/week        Progress Toward Goals  OT Goals(current goals can now be found in the care plan section)  Progress towards OT goals: Progressing toward goals  Acute Rehab OT Goals Patient Stated Goal: None stated. OT Goal Formulation: Patient unable to participate in goal setting Time For Goal Achievement: 04/13/24 Potential to Achieve Goals: Fair ADL Goals Pt Will Perform Eating: with modified independence;sitting Pt Will Perform Grooming: sitting;with supervision Pt Will Perform  Upper Body Bathing: with supervision;sitting Pt Will Perform Upper Body Dressing: with supervision;sitting Pt Will Perform Lower Body Dressing: with supervision;with contact guard assist;sitting/lateral leans Pt Will Transfer to Toilet: with contact guard assist;with min assist;stand pivot transfer Pt Will Perform Toileting - Clothing Manipulation and hygiene: with min assist;sitting/lateral leans;with contact guard assist Pt/caregiver will Perform Home Exercise Program: Increased strength;Increased ROM;Both right and left upper extremity;With minimal assist  Plan      Co-evaluation    PT/OT/SLP Co-Evaluation/Treatment: Yes Reason for Co-Treatment: To address functional/ADL transfers   OT goals addressed during session: ADL's and self-care                          End of Session Equipment Utilized During Treatment: Gait belt;Rolling walker (2 wheels)  OT Visit Diagnosis: Unsteadiness on feet (R26.81);Other abnormalities of gait and mobility (R26.89);Muscle weakness (generalized) (M62.81);History of falling (Z91.81);Repeated falls (R29.6);Other symptoms and  signs involving cognitive function   Activity Tolerance Patient limited by lethargy   Patient Left in chair;with call bell/phone within reach;with chair alarm set   Nurse Communication Mobility status        Time: 8596-8571 OT Time Calculation (min): 25 min  Charges: OT General Charges $OT Visit: 1 Visit OT Treatments $Therapeutic Exercise: 8-22 mins  Simpson Paulos OT, MOT  Jayson Person 03/31/2024, 3:26 PM

## 2024-03-31 NOTE — Inpatient Diabetes Management (Signed)
 Inpatient Diabetes Program Recommendations  AACE/ADA: New Consensus Statement on Inpatient Glycemic Control   Target Ranges:  Prepandial:   less than 140 mg/dL      Peak postprandial:   less than 180 mg/dL (1-2 hours)      Critically ill patients:  140 - 180 mg/dL    Latest Reference Range & Units 03/30/24 07:19 03/30/24 11:25 03/30/24 16:10 03/30/24 21:16 03/30/24 21:49 03/31/24 04:45 03/31/24 07:29  Glucose-Capillary 70 - 99 mg/dL 815 (H) 878 (H) 83 52 (L) 147 (H) 122 (H) 115 (H)   Review of Glycemic Control  Diabetes history: DM2 Outpatient Diabetes medications: Lantus  20 units QAM, Tradjenta  5 mg daily Current orders for Inpatient glycemic control: Semglee  10 units daily, Novolog  0-15 units TID with meals, Novolog  3 units TID with meals   Inpatient Diabetes Program Recommendations:    Insulin : Noted CBG down to 52 mg/dl at 78:83 on 87/70/74. May want to consider decreasing Novolog  correction to Novolog  0-9 units TID with meals. Nursing, be sure patient is eating at least 50% of meals before giving meal coverage insulin .  Thanks, Earnie Gainer, RN, MSN, CDCES Diabetes Coordinator Inpatient Diabetes Program [phone] (Team Pager from 8am to 5pm)

## 2024-03-31 NOTE — Progress Notes (Signed)
 Pt was assisted up into chair this afternoon by PT staff. Pt more awake at present, was able to take a few bites of his lunch meal and 4 sips of thickened liquid. Pt then refused further. Pt responds to name, denies c/o pain. Oriented to person only, occasionally yelling out for help and for [facility]. Pt assisted back into bed by NT staff x2, Pt was able to take magic cup and applesauce and one container of thickened tea from supper meal traY.

## 2024-03-31 NOTE — Progress Notes (Signed)
 Pt yelling our for help and for Merlynn. Pt denies c/o pain. Pt now able to tell me his name, his birthday and that he is in the hospital. Repositioned for comfort, bed alarm on for safety.

## 2024-03-31 NOTE — Progress Notes (Signed)
 Speech Language Pathology Treatment: Dysphagia  Patient Details Name: [name] MRN: [id number] DOB: 10-May-1940 Today's Date: 03/31/2024 Time: 8954-8942 SLP Time Calculation (min) (ACUTE ONLY): 12 min  Assessment / Plan / Recommendation Clinical Impression  Recommend downgrade diet to Dysphagia 1(puree)/nectar-thick liquids with FULL supervision d/t pt lethargy/impaired mentation as compared to initial BSE.  ST will f/u in acute setting for diet progression to baseline diet/dysphagia tx/management. Pt seen for f/u diet check/dysphagia tx with current diet of Dysphagia 2(minced)/nectar-thickened liquids with impaired mentation/lethargy d/t potential medications (Seroquel ) and limited interaction this session.  Oral care completed (limited) with xerostomia present.  Pt consumed nectar-thickened liquids via tsp and limited small bites of puree with oral holding/retention and max multimodal cues required for swallow initiation/bolus manipulation.  Due to pt change, recommend downgrading diet for safety/efficiency and to maintain adequate nutrition/hydration and FULL supervision/A with meals. Medications crushed in puree.  HPI HPI: 83 y.o. male with medical history significant of hypertension, hyperlipidemia, atrial fibrillation on apixaban , CVA, type 2 diabetes melitis, GERD, OSA on CPAP who presents to the emergency via EMS from assisted living facility due to unwitnessed fall. ST recommended D1/NTL per BSE; ST f/u for diet tolerance/dysphagia tx.      SLP Plan  Continue with current plan of care        Swallow Evaluation Recommendations   Recommendations: PO diet PO Diet Recommendation: Dysphagia 1 (Pureed);Mildly thick liquids (Level 2, nectar thick) Liquid Administration via: Cup;Straw Medication Administration: Crushed with puree Supervision: Full supervision/cueing for swallowing strategies;Staff to assist with self-feeding Postural changes: Stay upright 30-60 min after  meals;Position pt fully upright for meals Oral care recommendations: Oral care BID (2x/day);Staff/trained caregiver to provide oral care     Recommendations   PO diet (Dysphagia 1(puree)/nectar-thick liquids                  Oral care BID;Staff/trained caregiver to provide oral care   Frequent or constant Supervision/Assistance Dysphagia, oropharyngeal phase (R13.12)     Continue with current plan of care     Pat [name],M.S.,CCC-SLP  03/31/2024, 11:02 AM

## 2024-03-31 NOTE — Plan of Care (Signed)
  Problem: Education: Goal: Ability to describe self-care measures that may prevent or decrease complications (Diabetes Survival Skills Education) will improve Outcome: Progressing Goal: Individualized Educational Video(s) Outcome: Progressing   Problem: Coping: Goal: Ability to adjust to condition or change in health will improve Outcome: Progressing   Problem: Fluid Volume: Goal: Ability to maintain a balanced intake and output will improve Outcome: Progressing   Problem: Health Behavior/Discharge Planning: Goal: Ability to identify and utilize available resources and services will improve Outcome: Progressing Goal: Ability to manage health-related needs will improve Outcome: Progressing   Problem: Metabolic: Goal: Ability to maintain appropriate glucose levels will improve Outcome: Progressing   Problem: Nutritional: Goal: Maintenance of adequate nutrition will improve Outcome: Progressing Goal: Progress toward achieving an optimal weight will improve Outcome: Progressing   Problem: Skin Integrity: Goal: Risk for impaired skin integrity will decrease Outcome: Progressing   Problem: Tissue Perfusion: Goal: Adequacy of tissue perfusion will improve Outcome: Progressing   Problem: Education: Goal: Ability to describe self-care measures that may prevent or decrease complications (Diabetes Survival Skills Education) will improve Outcome: Progressing

## 2024-04-01 LAB — GLUCOSE, CAPILLARY
Glucose-Capillary: 170 mg/dL — ABNORMAL HIGH (ref 70–99)
Glucose-Capillary: 195 mg/dL — ABNORMAL HIGH (ref 70–99)

## 2024-04-01 LAB — CBC
HCT: 44.6 % (ref 39.0–52.0)
Hemoglobin: 14.2 g/dL (ref 13.0–17.0)
MCH: 30.5 pg (ref 26.0–34.0)
MCHC: 31.8 g/dL (ref 30.0–36.0)
MCV: 95.9 fL (ref 80.0–100.0)
Platelets: 107 K/uL — ABNORMAL LOW (ref 150–400)
RBC: 4.65 MIL/uL (ref 4.22–5.81)
RDW: 13.6 % (ref 11.5–15.5)
WBC: 5 K/uL (ref 4.0–10.5)
nRBC: 0 % (ref 0.0–0.2)

## 2024-04-01 LAB — BASIC METABOLIC PANEL WITH GFR
Anion gap: 9 (ref 5–15)
BUN: 26 mg/dL — ABNORMAL HIGH (ref 8–23)
CO2: 28 mmol/L (ref 22–32)
Calcium: 8.6 mg/dL — ABNORMAL LOW (ref 8.9–10.3)
Chloride: 105 mmol/L (ref 98–111)
Creatinine, Ser: 0.85 mg/dL (ref 0.61–1.24)
GFR, Estimated: >60 mL/min
Glucose, Bld: 192 mg/dL — ABNORMAL HIGH (ref 70–99)
Potassium: 3.9 mmol/L (ref 3.5–5.1)
Sodium: 142 mmol/L (ref 135–145)

## 2024-04-01 LAB — CULTURE, BLOOD (ROUTINE X 2): Special Requests: ADEQUATE

## 2024-04-01 LAB — MAGNESIUM: Magnesium: 2 mg/dL (ref 1.7–2.4)

## 2024-04-01 MED ORDER — LEVOFLOXACIN 500 MG PO TABS: 500.0000 mg | ORAL_TABLET | Freq: Every day | ORAL | Status: AC

## 2024-04-01 MED ORDER — TERBINAFINE HCL 1 % EX CREA: TOPICAL_CREAM | Freq: Two times a day (BID) | CUTANEOUS | Status: AC

## 2024-04-01 MED ORDER — ENSURE PLUS HIGH PROTEIN PO LIQD: 237.0000 mL | Freq: Two times a day (BID) | ORAL | Status: AC

## 2024-04-01 MED ORDER — ROSUVASTATIN CALCIUM 20 MG PO TABS: 20.0000 mg | ORAL_TABLET | Freq: Every day | ORAL | Status: AC

## 2024-04-01 MED ORDER — LANTUS SOLOSTAR 100 UNIT/ML ~~LOC~~ SOPN: 10.0000 [IU] | PEN_INJECTOR | Freq: Every morning | SUBCUTANEOUS | Status: AC

## 2024-04-01 NOTE — Progress Notes (Signed)
 Float nurse (myself) was asked by primary nurse to give lunch insulin  as well as fix foam dressing on coccyx. No other complains or concerns were brought to my attention at this time.

## 2024-04-01 NOTE — Plan of Care (Signed)
" °  Problem: Nutritional: Goal: Maintenance of adequate nutrition will improve Outcome: Progressing   Problem: Skin Integrity: Goal: Risk for impaired skin integrity will decrease Outcome: Progressing   Problem: Nutritional: Goal: Maintenance of adequate nutrition will improve Outcome: Progressing   Problem: Activity: Goal: Risk for activity intolerance will decrease Outcome: Progressing   Problem: Safety: Goal: Ability to remain free from injury will improve Outcome: Progressing   "

## 2024-04-01 NOTE — Discharge Instructions (Signed)
 IMPORTANT INFORMATION: PAY CLOSE ATTENTION   PHYSICIAN DISCHARGE INSTRUCTIONS  Follow with Primary care provider  Nichole Senior, MD  and other consultants as instructed by your Hospitalist Physician  SEEK MEDICAL CARE OR RETURN TO EMERGENCY ROOM IF SYMPTOMS COME BACK, WORSEN OR NEW PROBLEM DEVELOPS   Please note: You were cared for by a hospitalist during your hospital stay. Every effort will be made to forward records to your primary care provider.  You can request that your primary care provider send for your hospital records if they have not received them.  Once you are discharged, your primary care physician will handle any further medical issues. Please note that NO REFILLS for any discharge medications will be authorized once you are discharged, as it is imperative that you return to your primary care physician (or establish a relationship with a primary care physician if you do not have one) for your post hospital discharge needs so that they can reassess your need for medications and monitor your lab values.  Please get a complete blood count and chemistry panel checked by your Primary MD at your next visit, and again as instructed by your Primary MD.  Get Medicines reviewed and adjusted: Please take all your medications with you for your next visit with your Primary MD  Laboratory/radiological data: Please request your Primary MD to go over all hospital tests and procedure/radiological results at the follow up, please ask your primary care provider to get all Hospital records sent to his/her office.  In some cases, they will be blood work, cultures and biopsy results pending at the time of your discharge. Please request that your primary care provider follow up on these results.  If you are diabetic, please bring your blood sugar readings with you to your follow up appointment with primary care.    Please call and make your follow up appointments as soon as possible.    Also Note  the following: If you experience worsening of your admission symptoms, develop shortness of breath, life threatening emergency, suicidal or homicidal thoughts you must seek medical attention immediately by calling 911 or calling your MD immediately  if symptoms less severe.  You must read complete instructions/literature along with all the possible adverse reactions/side effects for all the Medicines you take and that have been prescribed to you. Take any new Medicines after you have completely understood and accpet all the possible adverse reactions/side effects.   Do not drive when taking Pain medications or sleeping medications (Benzodiazepines)  Do not take more than prescribed Pain, Sleep and Anxiety Medications. It is not advisable to combine anxiety,sleep and pain medications without talking with your primary care practitioner  Special Instructions: If you have smoked or chewed Tobacco  in the last 2 yrs please stop smoking, stop any regular Alcohol   and or any Recreational drug use.  Wear Seat belts while driving.  Do not drive if taking any narcotic, mind altering or controlled substances or recreational drugs or alcohol .

## 2024-04-01 NOTE — TOC Transition Note (Signed)
 Transition of Care [facility]) - Discharge Note   Patient Details  Name: [name] MRN: [id number] Date of Birth: 1940-10-08  Transition of Care [facility]) CM/SW Contact:  Hoy DELENA Bigness, LCSW Phone Number: 04/01/2024, 12:36 PM   Clinical Narrative:    Pt to discharge to [facility] for STR. Pt will be going to room 30. RN to call report to [phone]. Pt's spouse and daughter aware and agreeable to discharge plans. Medical necessity form has been printed to the unit. [facility] called at 12:49pm for transport.    Final next level of care: Skilled Nursing Facility Barriers to Discharge: Barriers Resolved   Patient Goals and CMS Choice Patient states their goals for this hospitalization and ongoing recovery are:: SNF for Short Term Rehab CMS Medicare.gov Compare Post Acute Care list provided to:: Patient Represenative (must comment) Choice offered to / list presented to : Spouse [place] ownership interest in [facility].provided to:: Spouse    Discharge Placement PASRR number recieved: 03/30/24            Patient chooses bed at: [facility] Patient to be transferred to facility by: [facility] Name of family member notified: Spouse and daughter Patient and family notified of of transfer: 04/01/24  Discharge Plan and Services Additional resources added to the After Visit Summary for       Post Acute Care Choice: Skilled Nursing Facility          DME Arranged: N/A DME Agency: NA                  Social Drivers of Health (SDOH) Interventions SDOH Screenings   Food Insecurity: No Food Insecurity (03/29/2024)  Housing: Low Risk (03/29/2024)  Transportation Needs: No Transportation Needs (03/29/2024)  Utilities: Not At Risk (03/29/2024)  Social Connections: Patient Unable To Answer (03/29/2024)  Tobacco Use: Low Risk (03/28/2024)     Readmission Risk Interventions    03/31/2024    2:38 PM 03/30/2024    3:30 PM 03/29/2024    4:24 PM  Readmission  Risk Prevention Plan  Transportation Screening Complete Complete Complete  PCP or Specialist Appt within 3-5 Days   Complete  HRI or Home Care Consult  Complete   Social Work Consult for Recovery Care Planning/Counseling  Complete   Palliative Care Screening  Not Applicable   Medication Review Oceanographer) Complete Complete Complete  HRI or Home Care Consult Complete    SW Recovery Care/Counseling Consult Complete    Palliative Care Screening Not Applicable    Skilled Nursing Facility Complete

## 2024-04-01 NOTE — Progress Notes (Addendum)
 Pt has been in bed, sleeping since 1300. Awakens to name called, oriented to person and place. [facility] personnel here to transport to to SNF rehab facility. Malewick removed, pericare performed. IV removed. VSS. Pt moved to stretcher from bed by staff without difficulty. EMS staff in possession of pt's belongings. Pt's daughter, [name], notified of pt's discharge and transport, states understanding.

## 2024-04-01 NOTE — Discharge Summary (Signed)
 Physician Discharge Summary  [name]:[id number] DOB: 07/27/1940 DOA: 03/28/2024  PCP: Nichole Senior, MD  Admit date: 03/28/2024 Discharge date: 04/01/2024  Disposition:  SNF   Recommendations for Outpatient Follow-up:  Follow up with PCP in 2 weeks Please monitor blood sugar at least 4 times per day Adjust treatment as needed for better glycemic control   Home Health: SNF  Discharge Condition: STABLE   CODE STATUS: DNR DIET: Dysphagia 1, nectar thickened liquids;  assist with feeding   Brief Hospitalization Summary: Please see all hospital notes, images, labs for full details of the hospitalization. Admission provider HPI:  83 y.o. male with medical history significant of hypertension, hyperlipidemia, atrial fibrillation on apixaban , CVA, type 2 diabetes melitis, GERD, OSA on CPAP who presents to the emergency via EMS from assisted living facility due to unwitnessed fall.  At bedside, patient was unable to provide history possibly due to underlying dementia.   He presented to the ED on 12/24 due to an unwitnessed fall where he sustained a skin tear to the right elbow and bleeding from mouth suspected to be due to either injury or possibly even a mass.  CT maxillofacial done showed no specific findings.  He was hyperglycemic, IV fluids and insulin  drip was given, blood sugar improved to 250 and patient was discharged back to assisted living facility.  83 y.o. male with medical history significant of hypertension, hyperlipidemia, atrial fibrillation on apixaban , CVA, type 2 diabetes melitis, GERD, OSA on CPAP who presents to the emergency via EMS from assisted living facility due to unwitnessed fall.    Hospital Course by listed problems addressed   Acute metabolic encephalopathy/underlying dementia - Confused, intermittently delirious, calling out for help.  Answers some questions appropriately.  Continue to reorient.  Appears to be improving back to baseline.  Restarted  patient's home Lexapro  and Seroquel .   Sepsis from Klebsiella UTI and bacteremia - sepsis physiology resolved now -- continue treatment as noted    Hyperosmolar hyperglycemic state - Markedly elevated glucose, AKI on presentation.  Elevated osmolality.  Aggressive IV fluid hydration, insulin  drip initiated.  Subsequently showed remarkable recovery of glucose, transitioned to subcu insulin .  Appears resolved.  Correct Phos, K.  Please monitor CBG at least 4 times per day    Uncontrolled insulin  dependent diabetes mellitus with hyperglycemia - A1c 9.6 suggesting control.  Currently on insulin  glargine 10 units daily with insulin  sliding scale however now noting some low blood sugars.  CBG (last 3)  Recent Labs    03/31/24 2100 04/01/24 0745 04/01/24 1120  GLUCAP 195* 170* 195*     urinary tract infection with Klebsiella pneumonia - Treated with cefepime  and ceftriaxone  2 g.  Transitioned to oral levofloxacin  to complete course upon discharge.   Acute kidney injury--RESOLVED  - Creatinine elevated on presentation up to 1.34.  Now improved to 0.89.  Showing continued improvement with aggressive IV fluid hydration.  Will recheck BMP in AM.   Physical debilitation muscle weakness/recurrent falls - PT/OT eval recommending SNF.   Persistent atrial flutter/fibrillation - Noted a flutter on telemetry.  Eliquis , metoprolol  on board.   Goals of care - Will work with patient, family, TOC on disposition planning.  [facility] auth.     Discharge Diagnoses:  Principal Problem:   Hyperosmolar hyperglycemic state (HHS) (HCC) Active Problems:   AKI (acute kidney injury)   Persistent atrial fibrillation (HCC)   Uncontrolled type 2 diabetes mellitus with hyperglycemia, with long-term current use of insulin  (  HCC)   Acute metabolic encephalopathy   Acute cystitis without hematuria   Discharge Instructions:  Allergies as of 04/01/2024   No Known  Allergies      Medication List     STOP taking these medications    mirabegron  ER 50 MG Tb24 tablet Commonly known as: MYRBETRIQ        TAKE these medications    apixaban  5 MG Tabs tablet Commonly known as: ELIQUIS  Take 1 tablet (5 mg total) by mouth 2 (two) times daily.   cyanocobalamin  1000 MCG tablet Commonly known as: VITAMIN B12 Take 1,000 mcg by mouth every morning.   D3 + K2 125-100 MCG Caps Take 1 capsule by mouth every morning.   Embecta AutoShield Duo 30G X 5 MM Misc Generic drug: Insulin  Pen Needle   escitalopram  10 MG tablet Commonly known as: LEXAPRO  Take 1 tablet (10 mg total) by mouth daily.   feeding supplement Liqd Take 237 mLs by mouth 2 (two) times daily between meals.   fenofibrate  160 MG tablet Take 160 mg by mouth every morning.   folic acid  1 MG tablet Commonly known as: FOLVITE  Take 1 tablet (1 mg total) by mouth daily.   hydrocortisone  2.5 % rectal cream Commonly known as: ANUSOL -HC Apply 1 Application topically 4 (four) times daily as needed for hemorrhoids or anal itching.   Lantus  SoloStar 100 UNIT/ML Solostar Pen Generic drug: insulin  glargine Inject 10 Units into the skin in the morning. What changed: how much to take   Lasix  20 MG tablet Generic drug: furosemide  Take 20 mg by mouth every morning.   levofloxacin  500 MG tablet Commonly known as: LEVAQUIN  Take 1 tablet (500 mg total) by mouth daily for 5 days.   linagliptin  5 MG Tabs tablet Commonly known as: Tradjenta  Take 1 tablet (5 mg total) by mouth daily.   magnesium  oxide 400 (240 Mg) MG tablet Commonly known as: MAG-OX Take 1 tablet (400 mg total) by mouth daily.   metoprolol  tartrate 25 MG tablet Commonly known as: LOPRESSOR  Take 0.5 tablets (12.5 mg total) by mouth 2 (two) times daily.   oxybutynin  10 MG 24 hr tablet Commonly known as: DITROPAN -XL Take 10 mg by mouth at bedtime.   potassium chloride  10 MEQ tablet Commonly known as: KLOR-CON  M Take 10  mEq by mouth daily.   QUEtiapine  25 MG tablet Commonly known as: SEROQUEL  Take 12.5 mg by mouth every morning. What changed: Another medication with the same name was removed. Continue taking this medication, and follow the directions you see here.   rosuvastatin  20 MG tablet Commonly known as: CRESTOR  Take 1 tablet (20 mg total) by mouth at bedtime.   terbinafine  1 % cream Commonly known as: LAMISIL  Apply topically 2 (two) times daily for 10 days. Apply to Affected area   traZODone  100 MG tablet Commonly known as: DESYREL  Take 100 mg by mouth at bedtime.   TYLENOL  500 MG tablet Generic drug: acetaminophen  Take 500 mg by mouth 2 (two) times daily.        Contact information for follow-up providers     Nichole Senior, MD. Schedule an appointment as soon as possible for a visit in 2 week(s).   Specialty: Endocrinology Why: Hospital Follow Up Contact information: [address] [place] [address] [phone]              Contact information for after-discharge care     Destination     [facility] .   Service:  Skilled Nursing Contact information: [address] [place]  [address] [phone]                    Allergies[1] Allergies as of 04/01/2024   No Known Allergies      Medication List     STOP taking these medications    mirabegron  ER 50 MG Tb24 tablet Commonly known as: MYRBETRIQ        TAKE these medications    apixaban  5 MG Tabs tablet Commonly known as: ELIQUIS  Take 1 tablet (5 mg total) by mouth 2 (two) times daily.   cyanocobalamin  1000 MCG tablet Commonly known as: VITAMIN B12 Take 1,000 mcg by mouth every morning.   D3 + K2 125-100 MCG Caps Take 1 capsule by mouth every morning.   Embecta AutoShield Duo 30G X 5 MM Misc Generic drug: Insulin  Pen Needle   escitalopram  10 MG tablet Commonly known as: LEXAPRO  Take 1 tablet (10 mg total) by mouth  daily.   feeding supplement Liqd Take 237 mLs by mouth 2 (two) times daily between meals.   fenofibrate  160 MG tablet Take 160 mg by mouth every morning.   folic acid  1 MG tablet Commonly known as: FOLVITE  Take 1 tablet (1 mg total) by mouth daily.   hydrocortisone  2.5 % rectal cream Commonly known as: ANUSOL -HC Apply 1 Application topically 4 (four) times daily as needed for hemorrhoids or anal itching.   Lantus  SoloStar 100 UNIT/ML Solostar Pen Generic drug: insulin  glargine Inject 10 Units into the skin in the morning. What changed: how much to take   Lasix  20 MG tablet Generic drug: furosemide  Take 20 mg by mouth every morning.   levofloxacin  500 MG tablet Commonly known as: LEVAQUIN  Take 1 tablet (500 mg total) by mouth daily for 5 days.   linagliptin  5 MG Tabs tablet Commonly known as: Tradjenta  Take 1 tablet (5 mg total) by mouth daily.   magnesium  oxide 400 (240 Mg) MG tablet Commonly known as: MAG-OX Take 1 tablet (400 mg total) by mouth daily.   metoprolol  tartrate 25 MG tablet Commonly known as: LOPRESSOR  Take 0.5 tablets (12.5 mg total) by mouth 2 (two) times daily.   oxybutynin  10 MG 24 hr tablet Commonly known as: DITROPAN -XL Take 10 mg by mouth at bedtime.   potassium chloride  10 MEQ tablet Commonly known as: KLOR-CON  M Take 10 mEq by mouth daily.   QUEtiapine  25 MG tablet Commonly known as: SEROQUEL  Take 12.5 mg by mouth every morning. What changed: Another medication with the same name was removed. Continue taking this medication, and follow the directions you see here.   rosuvastatin  20 MG tablet Commonly known as: CRESTOR  Take 1 tablet (20 mg total) by mouth at bedtime.   terbinafine  1 % cream Commonly known as: LAMISIL  Apply topically 2 (two) times daily for 10 days. Apply to Affected area   traZODone  100 MG tablet Commonly known as: DESYREL  Take 100 mg by mouth at bedtime.   TYLENOL  500 MG tablet Generic drug: acetaminophen  Take  500 mg by mouth 2 (two) times daily.        Procedures/Studies: CT Head Wo Contrast Result Date: 03/28/2024 EXAM: CT HEAD WITHOUT CONTRAST 03/28/2024 05:20:45 PM TECHNIQUE: CT of the head was performed without the administration of intravenous contrast. Automated exposure control, iterative reconstruction, and/or weight based adjustment of the mA/kV was utilized to reduce the radiation dose to as low as reasonably achievable. COMPARISON: 03/26/2024 CLINICAL HISTORY: Head trauma, minor (Age >=  65y) FINDINGS: BRAIN AND VENTRICLES: No acute hemorrhage. No evidence of acute infarct. Proportional prominence of ventricles and sulci, consistent with diffuse cerebral parenchymal volume loss. Confluent periventricular and subcortical white matter hypoattenuation, consistent with advanced chronic ischemic microvascular disease. Multifocal encephalomalacia in left frontal lobe and the left parieto-occipital lobes. Remote lacunar infarcts in the right basal ganglia and right thalamus. No extra-axial collection. No mass effect or midline shift. ORBITS: Left lens replacement. SINUSES: No acute abnormality. SOFT TISSUES AND SKULL: No acute soft tissue abnormality. No skull fracture. VASCULATURE: Calcified atherosclerotic plaque in cavernous/supraclinoid ICA and intradural vertebral arteries. IMPRESSION: 1. No acute intracranial abnormality. 2. Extensive chronic microvascular ischemic changes and remote infarcts are similar to prior. Electronically signed by: Donnice Mania MD 03/28/2024 06:52 PM EST RP Workstation: [id number]   CT Cervical Spine Wo Contrast Result Date: 03/28/2024 EXAM: CT CERVICAL SPINE WITHOUT CONTRAST 03/28/2024 05:20:45 PM TECHNIQUE: CT of the cervical spine was performed without the administration of intravenous contrast. Multiplanar reformatted images are provided for review. Automated exposure control, iterative reconstruction, and/or weight based adjustment of the mA/kV was utilized to reduce  the radiation dose to as low as reasonably achievable. COMPARISON: 03/26/2024 CLINICAL HISTORY: Neck trauma (Age >= 65y) FINDINGS: BONES AND ALIGNMENT: Similar appearance of prominence confluent enthesopathy from C2 through T4. There is disruption of anterior osteophytes at the C6-C7 level which is unchanged from prior. Redemonstrated nondisplaced transverse fracture through the bridging osteophytes anteriorly at C5-C6. There is no evidence of traumatic malalignment. DEGENERATIVE CHANGES: Degenerative endplate osteophytes and disc space narrowing at multiple levels. Disc osteophyte complexes at multiple levels. Spinal canal stenosis is most pronounced at C5-C6 where a right eccentric disc osteophyte complex results in cord compression. There is facet arthrosis and uncovertebral hypertrophy at multiple levels. Foraminal stenosis throughout the cervical spine similar to prior, most pronounced on the left at C2-C3 and on the right at C5-C6. SOFT TISSUES: No prevertebral soft tissue swelling. Alignment of the atherosclerotic plaque at the carotid bifurcations. IMPRESSION: 1. Redemonstrated nondisplaced transverse fracture through the bridging osteophytes anteriorly at C5-C6. 2. No evidence of acute traumatic malalignment. 3. Degenerative changes are similar to prior. 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 Electronically signed by: Donnice Mania MD 03/28/2024 06:41 PM EST RP Workstation: [id number]   CT CHEST ABDOMEN PELVIS W CONTRAST Result Date: 03/28/2024 CLINICAL DATA:  Polytrauma, blunt.  Unwitnessed fall. EXAM: CT CHEST, ABDOMEN, AND PELVIS WITH CONTRAST TECHNIQUE: Multidetector CT imaging of the chest, abdomen and pelvis was performed following the standard protocol during bolus administration of intravenous contrast. RADIATION DOSE REDUCTION: This exam was performed according to the departmental dose-optimization program which includes automated exposure control, adjustment of  the mA and/or kV according to patient size and/or use of iterative reconstruction technique. CONTRAST:  OMNIPAQUE  IOHEXOL  300 MG/ML  SOLN COMPARISON:  12/17/2023. FINDINGS: CT CHEST FINDINGS Cardiovascular: Heart is enlarged and there is a small pericardial effusion. Three-vessel coronary artery calcifications are noted. 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 Pulmonary trunk is distended suggesting underlying pulmonary artery hypertension. Mediastinum/Nodes: No mediastinal, hilar, or axillary lymphadenopathy. The thyroid  gland, trachea, and esophagus are within normal limits. Lungs/Pleura: Atelectasis is present bilaterally. No effusion or pneumothorax is seen. Musculoskeletal: Degenerative changes are present in the thoracic spine. No acute fracture is seen. CT ABDOMEN PELVIS FINDINGS Hepatobiliary: No focal liver abnormality is seen. Stones are present within the gallbladder. No biliary ductal dilatation. Pancreas: Unremarkable. No  pancreatic ductal dilatation or surrounding inflammatory changes. Spleen: Normal in size without focal abnormality. Adrenals/Urinary Tract: The adrenal glands are within normal limits. The kidneys enhance symmetrically. Scattered hypodensities are present in the kidneys bilaterally, the largest on the left is cystic in attenuation. No renal calculus or hydronephrosis bilaterally. The bladder is unremarkable. Stomach/Bowel: The stomach is within normal limits. No bowel obstruction, free air, or pneumatosis is seen. Appendix appears normal. Vascular/Lymphatic: Aortic atherosclerosis. No enlarged abdominal or pelvic lymph nodes. Reproductive: Prostate gland is enlarged. Other: No abdominopelvic ascites. A fat containing inguinal hernias present on the left. Musculoskeletal: Degenerative changes are noted in the lumbar spine. Pars defects are present at L5 bilaterally with mild anterolisthesis at L5-S1.  There is kyphosis of the upper lumbar spine. No acute fracture is seen. IMPRESSION: 1. No evidence of acute fracture or solid organ injury. 2. Cholelithiasis. 3. Enlarged prostate gland. 4. Coronary artery calcifications. 5. Aortic atherosclerosis. Electronically Signed   By: Leita Birmingham M.D.   On: 03/28/2024 18:41   DG FEMUR MIN 2 VIEWS LEFT Result Date: 03/28/2024 CLINICAL DATA:  Unwitnessed fall. EXAM: LEFT FEMUR 2 VIEWS COMPARISON:  None Available. FINDINGS: There is no evidence of fracture or other focal bone lesions. Knee arthroplasty is intact. Hip alignment is maintained with moderate degenerative change. Vascular calcifications. IMPRESSION: No acute fracture of the left femur. Electronically Signed   By: Andrea Gasman M.D.   On: 03/28/2024 17:47   CT CERVICAL SPINE WO CONTRAST Result Date: 03/26/2024 EXAM: CT HEAD, FACIAL BONES AND CERVICAL SPINE WITHOUT CONTRAST 03/26/2024 02:26:00 AM TECHNIQUE: CT of the head, facial bones and cervical spine was performed without the administration of intravenous contrast. Multiplanar reformatted images are provided for review. Automated exposure control, iterative reconstruction, and/or weight based adjustment of the mA/kV was utilized to reduce the radiation dose to as low as reasonably achievable. COMPARISON: Head, facial, and cervical spine CT studies all on 02/25/2024, cervical spine CT 12/17/2023. CLINICAL HISTORY: Head trauma, minor (Age >= 65y) FINDINGS: CT HEAD BRAIN AND VENTRICLES: No acute intracranial hemorrhage. No mass effect or midline shift. No extra-axial fluid collection. No evidence of acute infarct. There is encephalomalacia due to chronic infarcts in the left frontal lobe and left parieto-occipital area. 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 There is a chronic band infarct in the right thalamocapsular area, lacunar infarct in the left thalamus. The carotid  siphons and distal vertebral arteries are heavily calcified. No hyperdense vessel is seen. SKULL AND SCALP: No acute skull fracture. No scalp hematoma. CT FACIAL BONES FACIAL BONES: No acute facial fracture. No mandibular dislocation. No suspicious bone lesion. There is advanced tooth decay involving the left mandibular 12-year molar, with root caries and periapical lucencies involving the right maxillary molar dentition. Follow up with a dentist is recommended. ORBITS: No acute traumatic injury. There is no acute orbital finding with prior left lens replacement once again. SINUSES AND MASTOIDS: There is mild mucosal thickening in the floor of the bilateral maxillary sinuses. The other sinuses, bilateral mastoid air cells, and middle ear are clear. SOFT TISSUES: No acute abnormality. No facial hematoma or mass is seen. CT CERVICAL SPINE BONES AND ALIGNMENT: There is extensive anterior bulky bridging enthesopathy contiguous from C2 through T4. There is osteopenia. There is a nondisplaced transverse fracture through the bridging bone mantle again noted anteriorly at C5-C6, best seen on series 12 images 42 through 49 and series 10 images 27 through 29.  This was present on 02/25/2024 but it was not present on 12/17/2023. There is no change in appearance of the fracture, no transmission into the adjacent vertebral bodies and posterior elements. No new or acute fracture is evident, and no pathologic focal bone lesion is identified. No traumatic malalignment. DEGENERATIVE CHANGES: Partial disc space loss noted at all levels with calcifications in some of the discs, interbody ankylosis over portions of the disc spaces beginning at C3. Dorsal endplate osteophytes are present at most levels with variable cord encroachment, most significant at C5-C6 where there is a right lateralizing broad-based dorsal disc osteophyte complex compressing the cord on the right greater than left. There is multilevel facet hypertrophy and  uncinate spurring. Acquired foraminal stenosis is greatest on the left at C2-C3, on the right at C3-C4 and C4-C5, and on the right greater than left at C5-C6. SOFT TISSUES: No prevertebral soft tissue swelling. There is atherosclerosis in the aortic arch and great vessels with no acute findings in the visualized upper chest. There is heavy calcification of the carotid bifurcations. No laryngeal mass is seen. No spinal canal hematoma. IMPRESSION: 1. No acute intracranial CT findings or depressed skull fractures. 2. Nondisplaced transverse fracture through the anterior bridging bone mantle at C5-C6, stable compared to 02/25/2024, was not present on 12/17/2023. 3. Extensive anterior bulky bridging enthesopathy. . 4. 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 5. Advanced tooth decay involving the left mandibular molar and periapical lucencies involving the right maxillary molar dentition, and follow up with a dentist is recommended. 6. 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 7. No facial fracture is seen. 8. Chronic brain infarcts, heavy vascular calcifications. Electronically signed by: Francis Quam MD 03/26/2024 03:05 AM EST RP Workstation: [id number]   CT HEAD WO CONTRAST ( ) Result Date: 03/26/2024 EXAM: CT HEAD, FACIAL BONES AND CERVICAL SPINE WITHOUT CONTRAST 03/26/2024 02:26:00 AM TECHNIQUE: CT of the head, facial bones and cervical spine was performed without the administration of intravenous contrast. Multiplanar reformatted images are provided for review. Automated exposure control, iterative reconstruction, and/or weight based adjustment of the mA/kV was utilized to reduce the radiation dose to as low as reasonably achievable. COMPARISON: Head, facial, and cervical spine CT studies all on 02/25/2024, cervical spine CT 12/17/2023. CLINICAL HISTORY: Head trauma, minor (Age >= 65y) FINDINGS: CT HEAD BRAIN AND VENTRICLES: No  acute intracranial hemorrhage. No mass effect or midline shift. No extra-axial fluid collection. No evidence of acute infarct. There is encephalomalacia due to chronic infarcts in the left frontal lobe and left parieto-occipital area. 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 There is a chronic band infarct in the right thalamocapsular area, lacunar infarct in the left thalamus. The carotid siphons and distal vertebral arteries are heavily calcified. No hyperdense vessel is seen. SKULL AND SCALP: No acute skull fracture. No scalp hematoma. CT FACIAL BONES FACIAL BONES: No acute facial fracture. No mandibular dislocation. No suspicious bone lesion. There is advanced tooth decay involving the left mandibular 12-year molar, with root caries and periapical lucencies involving the right maxillary molar dentition. Follow up with a dentist is recommended. ORBITS: No acute traumatic injury. There is no acute orbital finding with prior left lens replacement once again. SINUSES AND MASTOIDS: There is mild mucosal thickening in the floor of the bilateral maxillary sinuses. The other sinuses, bilateral mastoid air cells, and middle ear are clear. SOFT TISSUES: No  acute abnormality. No facial hematoma or mass is seen. CT CERVICAL SPINE BONES AND ALIGNMENT: There is extensive anterior bulky bridging enthesopathy contiguous from C2 through T4. There is osteopenia. There is a nondisplaced transverse fracture through the bridging bone mantle again noted anteriorly at C5-C6, best seen on series 12 images 42 through 49 and series 10 images 27 through 29. This was present on 02/25/2024 but it was not present on 12/17/2023. There is no change in appearance of the fracture, no transmission into the adjacent vertebral bodies and posterior elements. No new or acute fracture is evident, and no pathologic focal bone lesion is identified. No traumatic malalignment.  DEGENERATIVE CHANGES: Partial disc space loss noted at all levels with calcifications in some of the discs, interbody ankylosis over portions of the disc spaces beginning at C3. Dorsal endplate osteophytes are present at most levels with variable cord encroachment, most significant at C5-C6 where there is a right lateralizing broad-based dorsal disc osteophyte complex compressing the cord on the right greater than left. There is multilevel facet hypertrophy and uncinate spurring. Acquired foraminal stenosis is greatest on the left at C2-C3, on the right at C3-C4 and C4-C5, and on the right greater than left at C5-C6. SOFT TISSUES: No prevertebral soft tissue swelling. There is atherosclerosis in the aortic arch and great vessels with no acute findings in the visualized upper chest. There is heavy calcification of the carotid bifurcations. No laryngeal mass is seen. No spinal canal hematoma. IMPRESSION: 1. No acute intracranial CT findings or depressed skull fractures. 2. Nondisplaced transverse fracture through the anterior bridging bone mantle at C5-C6, stable compared to 02/25/2024, was not present on 12/17/2023. 3. Extensive anterior bulky bridging enthesopathy. . 4. 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 5. Advanced tooth decay involving the left mandibular molar and periapical lucencies involving the right maxillary molar dentition, and follow up with a dentist is recommended. 6. 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 7. No facial fracture is seen. 8. Chronic brain infarcts, heavy vascular calcifications. Electronically signed by: Francis Quam MD 03/26/2024 03:05 AM EST RP Workstation: [id number]   CT Maxillofacial W Contrast Result Date: 03/26/2024 EXAM: CT HEAD, FACIAL BONES AND CERVICAL SPINE WITHOUT CONTRAST 03/26/2024 02:26:00 AM TECHNIQUE: CT of the head, facial bones and cervical spine was performed without the  administration of intravenous contrast. Multiplanar reformatted images are provided for review. Automated exposure control, iterative reconstruction, and/or weight based adjustment of the mA/kV was utilized to reduce the radiation dose to as low as reasonably achievable. COMPARISON: Head, facial, and cervical spine CT studies all on 02/25/2024, cervical spine CT 12/17/2023. CLINICAL HISTORY: Head trauma, minor (Age >= 65y) FINDINGS: CT HEAD BRAIN AND VENTRICLES: No acute intracranial hemorrhage. No mass effect or midline shift. No extra-axial fluid collection. No evidence of acute infarct. There is encephalomalacia due to chronic infarcts in the left frontal lobe and left parieto-occipital area. 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 There is a chronic band infarct in the right thalamocapsular area, lacunar infarct in the left thalamus. The carotid siphons and distal vertebral arteries are heavily calcified. No hyperdense vessel is seen. SKULL AND SCALP: No acute skull fracture. No scalp hematoma. CT FACIAL BONES FACIAL BONES: No acute facial fracture. No mandibular dislocation. No suspicious bone lesion. There is advanced tooth decay involving the left mandibular 12-year molar, with root caries and periapical lucencies involving  the right maxillary molar dentition. Follow up with a dentist is recommended. ORBITS: No acute traumatic injury. There is no acute orbital finding with prior left lens replacement once again. SINUSES AND MASTOIDS: There is mild mucosal thickening in the floor of the bilateral maxillary sinuses. The other sinuses, bilateral mastoid air cells, and middle ear are clear. SOFT TISSUES: No acute abnormality. No facial hematoma or mass is seen. CT CERVICAL SPINE BONES AND ALIGNMENT: There is extensive anterior bulky bridging enthesopathy contiguous from C2 through T4. There is osteopenia. There is a nondisplaced transverse  fracture through the bridging bone mantle again noted anteriorly at C5-C6, best seen on series 12 images 42 through 49 and series 10 images 27 through 29. This was present on 02/25/2024 but it was not present on 12/17/2023. There is no change in appearance of the fracture, no transmission into the adjacent vertebral bodies and posterior elements. No new or acute fracture is evident, and no pathologic focal bone lesion is identified. No traumatic malalignment. DEGENERATIVE CHANGES: Partial disc space loss noted at all levels with calcifications in some of the discs, interbody ankylosis over portions of the disc spaces beginning at C3. Dorsal endplate osteophytes are present at most levels with variable cord encroachment, most significant at C5-C6 where there is a right lateralizing broad-based dorsal disc osteophyte complex compressing the cord on the right greater than left. There is multilevel facet hypertrophy and uncinate spurring. Acquired foraminal stenosis is greatest on the left at C2-C3, on the right at C3-C4 and C4-C5, and on the right greater than left at C5-C6. SOFT TISSUES: No prevertebral soft tissue swelling. There is atherosclerosis in the aortic arch and great vessels with no acute findings in the visualized upper chest. There is heavy calcification of the carotid bifurcations. No laryngeal mass is seen. No spinal canal hematoma. IMPRESSION: 1. No acute intracranial CT findings or depressed skull fractures. 2. Nondisplaced transverse fracture through the anterior bridging bone mantle at C5-C6, stable compared to 02/25/2024, was not present on 12/17/2023. 3. Extensive anterior bulky bridging enthesopathy. . 4. 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 5. Advanced tooth decay involving the left mandibular molar and periapical lucencies involving the right maxillary molar dentition, and follow up with a dentist is recommended. 6. Membrane  disease  in the floor of the maxillary sinuses. 7. No facial fracture is seen. 8. Chronic brain infarcts, heavy vascular calcifications. Electronically signed by: Francis Quam MD 03/26/2024 03:05 AM EST RP Workstation: [id number]     Subjective: Pt less agitated and eating better today.  No specific complaints.   Discharge Exam: Vitals:   04/01/24 0542 04/01/24 0944  BP: 106/62 (!) 158/83  Pulse: 92 92  Resp: 16   Temp: (!) 97.5 F (36.4 C)   SpO2: 96%    Vitals:   03/31/24 1351 03/31/24 2108 04/01/24 0542 04/01/24 0944  BP: (!) 163/87 123/71 106/62 (!) 158/83  Pulse: 73 (!) 109 92 92  Resp: 18 20 16    Temp: 98.3 F (36.8 C) 98.2 F (36.8 C) (!) 97.5 F (36.4 C)   TempSrc: Axillary Axillary Oral   SpO2: 97% 100% 96%   Weight:      Height:        General: Pt is alert, awake, not in acute distress Cardiovascular: normal S1/S2 +, no rubs, no gallops Respiratory: CTA bilaterally, no wheezing, no rhonchi Abdominal: Soft, NT, ND, bowel sounds + Extremities: no edema, no cyanosis  The results of significant diagnostics from this hospitalization (including imaging, microbiology, ancillary and laboratory) are listed below for reference.     Microbiology: Recent Results (from the past 240 hours)  Urine Culture     Status: Abnormal   Collection Time: 03/28/24  4:01 PM   Specimen: Urine, Random  Result Value Ref Range Status   Specimen Description   Final    URINE, RANDOM Performed at [facility], [address] [place], [place] [address]    Special Requests   Final    NONE Reflexed from [id number] Performed at [facility], [address], [place], [place] [address]    Culture >=100,000 COLONIES/mL KLEBSIELLA PNEUMONIAE (A)  Final   Report Status 03/30/2024 FINAL  Final   Organism ID, Bacteria KLEBSIELLA PNEUMONIAE (A)  Final      Susceptibility   Klebsiella pneumoniae - MIC*    AMPICILLIN >=32 RESISTANT Resistant     CEFAZOLIN (URINE) Value in next row Sensitive       2 SENSITIVEThis is a modified FDA-approved test that has been validated and its performance characteristics determined by the reporting laboratory.  This laboratory is certified under the Clinical Laboratory Improvement Amendments CLIA as qualified to perform high complexity clinical laboratory testing.    CEFEPIME  Value in next row Sensitive      2 SENSITIVEThis is a modified FDA-approved test that has been validated and its performance characteristics determined by the reporting laboratory.  This laboratory is certified under the Clinical Laboratory Improvement Amendments CLIA as qualified to perform high complexity clinical laboratory testing.    ERTAPENEM Value in next row Sensitive      2 SENSITIVEThis is a modified FDA-approved test that has been validated and its performance characteristics determined by the reporting laboratory.  This laboratory is certified under the Clinical Laboratory Improvement Amendments CLIA as qualified to perform high complexity clinical laboratory testing.    CEFTRIAXONE  Value in next row Sensitive      2 SENSITIVEThis is a modified FDA-approved test that has been validated and its performance characteristics determined by the reporting laboratory.  This laboratory is certified under the Clinical Laboratory Improvement Amendments CLIA as qualified to perform high complexity clinical laboratory testing.    CIPROFLOXACIN Value in next row Sensitive      2 SENSITIVEThis is a modified FDA-approved test that has been validated and its performance characteristics determined by the reporting laboratory.  This laboratory is certified under the Clinical Laboratory Improvement Amendments CLIA as qualified to perform high complexity clinical laboratory testing.    GENTAMICIN Value in next row Sensitive      2 SENSITIVEThis is a modified FDA-approved test that has been validated and its performance characteristics determined by the reporting laboratory.  This laboratory is certified  under the Clinical Laboratory Improvement Amendments CLIA as qualified to perform high complexity clinical laboratory testing.    NITROFURANTOIN Value in next row Intermediate      2 SENSITIVEThis is a modified FDA-approved test that has been validated and its performance characteristics determined by the reporting laboratory.  This laboratory is certified under the Clinical Laboratory Improvement Amendments CLIA as qualified to perform high complexity clinical laboratory testing.    TRIMETH/SULFA Value in next row Sensitive      2 SENSITIVEThis is a modified FDA-approved test that has been validated and its performance characteristics determined by the reporting laboratory.  This laboratory is certified under the Clinical Laboratory Improvement Amendments CLIA as qualified to perform high complexity clinical laboratory testing.  AMPICILLIN/SULBACTAM Value in next row Sensitive      2 SENSITIVEThis is a modified FDA-approved test that has been validated and its performance characteristics determined by the reporting laboratory.  This laboratory is certified under the Clinical Laboratory Improvement Amendments CLIA as qualified to perform high complexity clinical laboratory testing.    PIP/TAZO Value in next row Sensitive      <=4 SENSITIVEThis is a modified FDA-approved test that has been validated and its performance characteristics determined by the reporting laboratory.  This laboratory is certified under the Clinical Laboratory Improvement Amendments CLIA as qualified to perform high complexity clinical laboratory testing.    MEROPENEM Value in next row Sensitive      <=4 SENSITIVEThis is a modified FDA-approved test that has been validated and its performance characteristics determined by the reporting laboratory.  This laboratory is certified under the Clinical Laboratory Improvement Amendments CLIA as qualified to perform high complexity clinical laboratory testing.    * >=100,000 COLONIES/mL  KLEBSIELLA PNEUMONIAE  Resp panel by RT-PCR (RSV, Flu A&B, Covid) Anterior Nasal Swab     Status: None   Collection Time: 03/28/24  4:33 PM   Specimen: Anterior Nasal Swab  Result Value Ref Range Status   SARS Coronavirus 2 by RT PCR NEGATIVE NEGATIVE Final    Comment: (NOTE) SARS-CoV-2 target nucleic acids are NOT DETECTED.  The SARS-CoV-2 RNA is generally detectable in upper respiratory specimens during the acute phase of infection. The lowest concentration of SARS-CoV-2 viral copies this assay can detect is 138 copies/mL. A negative result does not preclude SARS-Cov-2 infection and should not be used as the sole basis for treatment or other patient management decisions. A negative result may occur with  improper specimen collection/handling, submission of specimen other than nasopharyngeal swab, presence of viral mutation(s) within the areas targeted by this assay, and inadequate number of viral copies(<138 copies/mL). A negative result must be combined with clinical observations, patient history, and epidemiological information. The expected result is Negative.  Fact Sheet for Patients:  bloggercourse.com  Fact Sheet for Healthcare Providers:  seriousbroker.it  This test is no t yet approved or cleared by the [place]  FDA and  has been authorized for detection and/or diagnosis of SARS-CoV-2 by FDA under an Emergency Use Authorization (EUA). This EUA will remain  in effect (meaning this test can be used) for the duration of the COVID-19 declaration under Section 564(b)(1) of the Act, 21 U.S.C.section 360bbb-3(b)(1), unless the authorization is terminated  or revoked sooner.       Influenza A by PCR NEGATIVE NEGATIVE Final   Influenza B by PCR NEGATIVE NEGATIVE Final    Comment: (NOTE) The Xpert Xpress SARS-CoV-2/FLU/RSV plus assay is intended as an aid in the diagnosis of influenza from Nasopharyngeal swab specimens  and should not be used as a sole basis for treatment. Nasal washings and aspirates are unacceptable for Xpert Xpress SARS-CoV-2/FLU/RSV testing.  Fact Sheet for Patients: bloggercourse.com  Fact Sheet for Healthcare Providers: seriousbroker.it  This test is not yet approved or cleared by the [place]  FDA and has been authorized for detection and/or diagnosis of SARS-CoV-2 by FDA under an Emergency Use Authorization (EUA). This EUA will remain in effect (meaning this test can be used) for the duration of the COVID-19 declaration under Section 564(b)(1) of the Act, 21 U.S.C. section 360bbb-3(b)(1), unless the authorization is terminated or revoked.     Resp Syncytial Virus by PCR NEGATIVE NEGATIVE Final    Comment: (NOTE) Fact Sheet for Patients:  bloggercourse.com  Fact Sheet for Healthcare Providers: seriousbroker.it  This test is not yet approved or cleared by the [place]  FDA and has been authorized for detection and/or diagnosis of SARS-CoV-2 by FDA under an Emergency Use Authorization (EUA). This EUA will remain in effect (meaning this test can be used) for the duration of the COVID-19 declaration under Section 564(b)(1) of the Act, 21 U.S.C. section 360bbb-3(b)(1), unless the authorization is terminated or revoked.  Performed at [facility], [address] [place], [place] [address]   Culture, blood (Routine X 2) w Reflex to ID Panel     Status: Abnormal   Collection Time: 03/28/24 10:23 PM   Specimen: BLOOD RIGHT HAND  Result Value Ref Range Status   Specimen Description   Final    BLOOD RIGHT HAND Performed at [facility], [address], [place], [place] [address]    Special Requests   Final    BOTTLES DRAWN AEROBIC ONLY Blood Culture adequate volume Performed at [facility], [address], [place], [place] [address]    Culture  Setup Time   Final     AEROBIC BOTTLE ONLY GRAM POSITIVE RODS Gram Stain Report Called to,Read Back By and Verified With: A SHELTON AT 1416 ON 12.29.25 BY ADGER J  Performed at [facility], [address] [place], [place] [address]    Culture (A)  Final    LACTOBACILLUS GASSERI Standardized susceptibility testing for this organism is not available. Performed at [facility], [address], [place], [place] [address]    Report Status 04/01/2024 FINAL  Final  Culture, blood (Routine X 2) w Reflex to ID Panel     Status: Abnormal   Collection Time: 03/28/24 10:31 PM   Specimen: Left Antecubital; Blood  Result Value Ref Range Status   Specimen Description   Final    LEFT ANTECUBITAL Performed at [facility], [address] [place], [place] [address]    Special Requests   Final    BOTTLES DRAWN AEROBIC ONLY Blood Culture results may not be optimal due to an inadequate volume of blood received in culture bottles Performed at [facility], [address] [place], [place] [address]    Culture  Setup Time   Final    GRAM NEGATIVE RODS AEROBIC BOTTLE ONLY Gram Stain Report Called to,Read Back By and Verified With: G ROOS AT 1424 ON [id number] BY S DALTON CRITICAL RESULT CALLED TO, READ BACK BY AND VERIFIED WITH: B SUPER RN 03/29/2024 @ 2038 BY AB Performed at [facility], [address] [place], [place] [address]    Culture KLEBSIELLA PNEUMONIAE (A)  Final   Report Status 03/31/2024 FINAL  Final   Organism ID, Bacteria KLEBSIELLA PNEUMONIAE  Final      Susceptibility   Klebsiella pneumoniae - MIC*    AMPICILLIN >=32 RESISTANT Resistant     CEFAZOLIN (NON-URINE) 2 SENSITIVE Sensitive     CEFEPIME  <=0.12 SENSITIVE Sensitive     ERTAPENEM <=0.12 SENSITIVE Sensitive     CEFTRIAXONE  <=0.25 SENSITIVE Sensitive     CIPROFLOXACIN <=0.06 SENSITIVE Sensitive     GENTAMICIN <=1 SENSITIVE Sensitive     MEROPENEM <=0.25 SENSITIVE Sensitive     TRIMETH/SULFA <=20 SENSITIVE Sensitive      AMPICILLIN/SULBACTAM 4 SENSITIVE Sensitive     PIP/TAZO Value in next row Sensitive      <=4 SENSITIVEThis is a modified FDA-approved test that has been validated and its performance characteristics determined by the reporting laboratory.  This laboratory is certified under the Clinical Laboratory Improvement Amendments CLIA as qualified to perform high complexity clinical laboratory testing.    * KLEBSIELLA PNEUMONIAE  Blood Culture ID Panel (Reflexed)     Status: Abnormal   Collection Time: 03/28/24 10:31 PM  Result Value Ref Range Status   Enterococcus faecalis NOT DETECTED NOT DETECTED Final   Enterococcus Faecium NOT DETECTED NOT DETECTED Final   Listeria monocytogenes NOT DETECTED NOT DETECTED Final   Staphylococcus species NOT DETECTED NOT DETECTED Final   Staphylococcus aureus (BCID) NOT DETECTED NOT DETECTED Final   Staphylococcus epidermidis NOT DETECTED NOT DETECTED Final   Staphylococcus lugdunensis NOT DETECTED NOT DETECTED Final   Streptococcus species NOT DETECTED NOT DETECTED Final   Streptococcus agalactiae NOT DETECTED NOT DETECTED Final   Streptococcus pneumoniae NOT DETECTED NOT DETECTED Final   Streptococcus pyogenes NOT DETECTED NOT DETECTED Final   A.calcoaceticus-baumannii NOT DETECTED NOT DETECTED Final   Bacteroides fragilis NOT DETECTED NOT DETECTED Final   Enterobacterales DETECTED (A) NOT DETECTED Final    Comment: Enterobacterales represent a large order of gram negative bacteria, not a single organism. CRITICAL RESULT CALLED TO, READ BACK BY AND VERIFIED WITH: B SUPER RN 03/29/2024 @ 2038 BY AB    Enterobacter cloacae complex NOT DETECTED NOT DETECTED Final   Escherichia coli NOT DETECTED NOT DETECTED Final   Klebsiella aerogenes NOT DETECTED NOT DETECTED Final   Klebsiella oxytoca NOT DETECTED NOT DETECTED Final   Klebsiella pneumoniae DETECTED (A) NOT DETECTED Final    Comment: CRITICAL RESULT CALLED TO, READ BACK BY AND VERIFIED WITH: B SUPER RN  03/29/2024 @ 2038 BY AB    Proteus species NOT DETECTED NOT DETECTED Final   Salmonella species NOT DETECTED NOT DETECTED Final   Serratia marcescens NOT DETECTED NOT DETECTED Final   Haemophilus influenzae NOT DETECTED NOT DETECTED Final   Neisseria meningitidis NOT DETECTED NOT DETECTED Final   Pseudomonas aeruginosa NOT DETECTED NOT DETECTED Final   Stenotrophomonas maltophilia NOT DETECTED NOT DETECTED Final   Candida albicans NOT DETECTED NOT DETECTED Final   Candida auris NOT DETECTED NOT DETECTED Final   Candida glabrata NOT DETECTED NOT DETECTED Final   Candida krusei NOT DETECTED NOT DETECTED Final   Candida parapsilosis NOT DETECTED NOT DETECTED Final   Candida tropicalis NOT DETECTED NOT DETECTED Final   Cryptococcus neoformans/gattii NOT DETECTED NOT DETECTED Final   CTX-M ESBL NOT DETECTED NOT DETECTED Final   Carbapenem resistance IMP NOT DETECTED NOT DETECTED Final   Carbapenem resistance KPC NOT DETECTED NOT DETECTED Final   Carbapenem resistance NDM NOT DETECTED NOT DETECTED Final   Carbapenem resist OXA 48 LIKE NOT DETECTED NOT DETECTED Final   Carbapenem resistance VIM NOT DETECTED NOT DETECTED Final    Comment: Performed at [facility], [address] [place], [place] [address]  MRSA Next Gen by PCR, Nasal     Status: None   Collection Time: 03/29/24  7:40 AM   Specimen: Nasal Mucosa; Nasal Swab  Result Value Ref Range Status   MRSA by PCR Next Gen NOT DETECTED NOT DETECTED Final    Comment: (NOTE) The GeneXpert MRSA Assay (FDA approved for NASAL specimens only), is one component of a comprehensive MRSA colonization surveillance program. It is not intended to diagnose MRSA infection nor to guide or monitor treatment for MRSA infections. Test performance is not FDA approved in patients less than 40 years old. Performed at [facility], [address] [place], [place] [address]  Labs: BNP (last 3 results) No results for input(s): BNP in the last  8760 hours. Basic Metabolic Panel: Recent Labs  Lab 03/28/24 2132 03/29/24 0102 03/29/24 0649 03/30/24 0326 03/31/24 0422 04/01/24 0417  NA 149* 148*  --  140 141 142  K 3.6 4.1  --  3.9 3.5 3.9  CL 110 110  --  106 107 105  CO2 30 30  --  28 30 28   GLUCOSE 205* 258*  --  187* 129* 192*  BUN 15 15  --  23 22 26*  CREATININE 1.22 1.17  --  1.12 0.89 0.85  CALCIUM  9.7 9.3  --  9.0 8.3* 8.6*  MG  --   --  2.3 2.1  --  2.0  PHOS  --   --  1.6* 2.3*  --   --    Liver Function Tests: Recent Labs  Lab 03/28/24 1559  AST 19  ALT 13  ALKPHOS 91  BILITOT 1.2  PROT 6.4*  ALBUMIN 3.8   No results for input(s): LIPASE, AMYLASE in the last 168 hours. No results for input(s): AMMONIA in the last 168 hours. CBC: Recent Labs  Lab 03/28/24 1559 03/28/24 1623 03/29/24 0649 03/30/24 0326 03/31/24 0422 04/01/24 0417  WBC 5.5  --  7.2 6.6 5.7 5.0  NEUTROABS 4.2  --   --   --   --   --   HGB 14.6 15.0 13.5 13.7 13.0 14.2  HCT 45.3 44.0 42.8 43.3 40.2 44.6  MCV 95.8  --  98.2 96.7 95.7 95.9  PLT 127*  --  111* 111* 109* 107*   Cardiac Enzymes: No results for input(s): CKTOTAL, CKMB, CKMBINDEX, TROPONINI in the last 168 hours. BNP: Invalid input(s): POCBNP CBG: Recent Labs  Lab 03/31/24 1113 03/31/24 1613 03/31/24 2100 04/01/24 0745 04/01/24 1120  GLUCAP 93 114* 195* 170* 195*   D-Dimer No results for input(s): DDIMER in the last 72 hours. Hgb A1c Recent Labs    03/30/24 1125  HGBA1C 9.6*   Lipid Profile No results for input(s): CHOL, HDL, LDLCALC, TRIG, CHOLHDL, LDLDIRECT in the last 72 hours. Thyroid  function studies No results for input(s): TSH, T4TOTAL, T3FREE, THYROIDAB in the last 72 hours.  Invalid input(s): FREET3 Anemia work up No results for input(s): VITAMINB12, FOLATE, FERRITIN, TIBC, IRON, RETICCTPCT in the last 72 hours. Urinalysis    Component Value Date/Time   COLORURINE YELLOW 03/28/2024  1601   APPEARANCEUR CLEAR 03/28/2024 1601   LABSPEC 1.028 03/28/2024 1601   PHURINE 5.0 03/28/2024 1601   GLUCOSEU >=500 (A) 03/28/2024 1601   HGBUR SMALL (A) 03/28/2024 1601   BILIRUBINUR NEGATIVE 03/28/2024 1601   KETONESUR 5 (A) 03/28/2024 1601   PROTEINUR NEGATIVE 03/28/2024 1601   UROBILINOGEN 0.2 03/13/2014 2240   NITRITE POSITIVE (A) 03/28/2024 1601   LEUKOCYTESUR TRACE (A) 03/28/2024 1601   Sepsis Labs Recent Labs  Lab 03/29/24 0649 03/30/24 0326 03/31/24 0422 04/01/24 0417  WBC 7.2 6.6 5.7 5.0   Microbiology Recent Results (from the past 240 hours)  Urine Culture     Status: Abnormal   Collection Time: 03/28/24  4:01 PM   Specimen: Urine, Random  Result Value Ref Range Status   Specimen Description   Final    URINE, RANDOM Performed at [facility], [address], [place], [place] [address]    Special Requests   Final    NONE Reflexed from [id number] Performed at [facility], [address] [place], [place] [address]  Culture >=100,000 COLONIES/mL KLEBSIELLA PNEUMONIAE (A)  Final   Report Status 03/30/2024 FINAL  Final   Organism ID, Bacteria KLEBSIELLA PNEUMONIAE (A)  Final      Susceptibility   Klebsiella pneumoniae - MIC*    AMPICILLIN >=32 RESISTANT Resistant     CEFAZOLIN (URINE) Value in next row Sensitive      2 SENSITIVEThis is a modified FDA-approved test that has been validated and its performance characteristics determined by the reporting laboratory.  This laboratory is certified under the Clinical Laboratory Improvement Amendments CLIA as qualified to perform high complexity clinical laboratory testing.    CEFEPIME  Value in next row Sensitive      2 SENSITIVEThis is a modified FDA-approved test that has been validated and its performance characteristics determined by the reporting laboratory.  This laboratory is certified under the Clinical Laboratory Improvement Amendments CLIA as qualified to perform high complexity clinical laboratory testing.     ERTAPENEM Value in next row Sensitive      2 SENSITIVEThis is a modified FDA-approved test that has been validated and its performance characteristics determined by the reporting laboratory.  This laboratory is certified under the Clinical Laboratory Improvement Amendments CLIA as qualified to perform high complexity clinical laboratory testing.    CEFTRIAXONE  Value in next row Sensitive      2 SENSITIVEThis is a modified FDA-approved test that has been validated and its performance characteristics determined by the reporting laboratory.  This laboratory is certified under the Clinical Laboratory Improvement Amendments CLIA as qualified to perform high complexity clinical laboratory testing.    CIPROFLOXACIN Value in next row Sensitive      2 SENSITIVEThis is a modified FDA-approved test that has been validated and its performance characteristics determined by the reporting laboratory.  This laboratory is certified under the Clinical Laboratory Improvement Amendments CLIA as qualified to perform high complexity clinical laboratory testing.    GENTAMICIN Value in next row Sensitive      2 SENSITIVEThis is a modified FDA-approved test that has been validated and its performance characteristics determined by the reporting laboratory.  This laboratory is certified under the Clinical Laboratory Improvement Amendments CLIA as qualified to perform high complexity clinical laboratory testing.    NITROFURANTOIN Value in next row Intermediate      2 SENSITIVEThis is a modified FDA-approved test that has been validated and its performance characteristics determined by the reporting laboratory.  This laboratory is certified under the Clinical Laboratory Improvement Amendments CLIA as qualified to perform high complexity clinical laboratory testing.    TRIMETH/SULFA Value in next row Sensitive      2 SENSITIVEThis is a modified FDA-approved test that has been validated and its performance characteristics determined by  the reporting laboratory.  This laboratory is certified under the Clinical Laboratory Improvement Amendments CLIA as qualified to perform high complexity clinical laboratory testing.    AMPICILLIN/SULBACTAM Value in next row Sensitive      2 SENSITIVEThis is a modified FDA-approved test that has been validated and its performance characteristics determined by the reporting laboratory.  This laboratory is certified under the Clinical Laboratory Improvement Amendments CLIA as qualified to perform high complexity clinical laboratory testing.    PIP/TAZO Value in next row Sensitive      <=4 SENSITIVEThis is a modified FDA-approved test that has been validated and its performance characteristics determined by the reporting laboratory.  This laboratory is certified under the Clinical Laboratory Improvement Amendments CLIA as qualified to perform high complexity clinical laboratory testing.  MEROPENEM Value in next row Sensitive      <=4 SENSITIVEThis is a modified FDA-approved test that has been validated and its performance characteristics determined by the reporting laboratory.  This laboratory is certified under the Clinical Laboratory Improvement Amendments CLIA as qualified to perform high complexity clinical laboratory testing.    * >=100,000 COLONIES/mL KLEBSIELLA PNEUMONIAE  Resp panel by RT-PCR (RSV, Flu A&B, Covid) Anterior Nasal Swab     Status: None   Collection Time: 03/28/24  4:33 PM   Specimen: Anterior Nasal Swab  Result Value Ref Range Status   SARS Coronavirus 2 by RT PCR NEGATIVE NEGATIVE Final    Comment: (NOTE) SARS-CoV-2 target nucleic acids are NOT DETECTED.  The SARS-CoV-2 RNA is generally detectable in upper respiratory specimens during the acute phase of infection. The lowest concentration of SARS-CoV-2 viral copies this assay can detect is 138 copies/mL. A negative result does not preclude SARS-Cov-2 infection and should not be used as the sole basis for treatment or other  patient management decisions. A negative result may occur with  improper specimen collection/handling, submission of specimen other than nasopharyngeal swab, presence of viral mutation(s) within the areas targeted by this assay, and inadequate number of viral copies(<138 copies/mL). A negative result must be combined with clinical observations, patient history, and epidemiological information. The expected result is Negative.  Fact Sheet for Patients:  bloggercourse.com  Fact Sheet for Healthcare Providers:  seriousbroker.it  This test is no t yet approved or cleared by the [place]  FDA and  has been authorized for detection and/or diagnosis of SARS-CoV-2 by FDA under an Emergency Use Authorization (EUA). This EUA will remain  in effect (meaning this test can be used) for the duration of the COVID-19 declaration under Section 564(b)(1) of the Act, 21 U.S.C.section 360bbb-3(b)(1), unless the authorization is terminated  or revoked sooner.       Influenza A by PCR NEGATIVE NEGATIVE Final   Influenza B by PCR NEGATIVE NEGATIVE Final    Comment: (NOTE) The Xpert Xpress SARS-CoV-2/FLU/RSV plus assay is intended as an aid in the diagnosis of influenza from Nasopharyngeal swab specimens and should not be used as a sole basis for treatment. Nasal washings and aspirates are unacceptable for Xpert Xpress SARS-CoV-2/FLU/RSV testing.  Fact Sheet for Patients: bloggercourse.com  Fact Sheet for Healthcare Providers: seriousbroker.it  This test is not yet approved or cleared by the [place]  FDA and has been authorized for detection and/or diagnosis of SARS-CoV-2 by FDA under an Emergency Use Authorization (EUA). This EUA will remain in effect (meaning this test can be used) for the duration of the COVID-19 declaration under Section 564(b)(1) of the Act, 21 U.S.C. section  360bbb-3(b)(1), unless the authorization is terminated or revoked.     Resp Syncytial Virus by PCR NEGATIVE NEGATIVE Final    Comment: (NOTE) Fact Sheet for Patients: bloggercourse.com  Fact Sheet for Healthcare Providers: seriousbroker.it  This test is not yet approved or cleared by the [place]  FDA and has been authorized for detection and/or diagnosis of SARS-CoV-2 by FDA under an Emergency Use Authorization (EUA). This EUA will remain in effect (meaning this test can be used) for the duration of the COVID-19 declaration under Section 564(b)(1) of the Act, 21 U.S.C. section 360bbb-3(b)(1), unless the authorization is terminated or revoked.  Performed at [facility], [address], [place], [place] [address]   Culture, blood (Routine X 2) w Reflex to ID Panel     Status: Abnormal   Collection Time:  03/28/24 10:23 PM   Specimen: BLOOD RIGHT HAND  Result Value Ref Range Status   Specimen Description   Final    BLOOD RIGHT HAND Performed at [facility], [address] [place], [place] [address]    Special Requests   Final    BOTTLES DRAWN AEROBIC ONLY Blood Culture adequate volume Performed at [facility], [address] [place], [place] [address]    Culture  Setup Time   Final    AEROBIC BOTTLE ONLY GRAM POSITIVE RODS Gram Stain Report Called to,Read Back By and Verified With: A SHELTON AT 1416 ON 12.29.25 BY ADGER J  Performed at [facility], [address], [place], [place] [address]    Culture (A)  Final    LACTOBACILLUS GASSERI Standardized susceptibility testing for this organism is not available. Performed at [facility], [address] [place], [place] [address]    Report Status 04/01/2024 FINAL  Final  Culture, blood (Routine X 2) w Reflex to ID Panel     Status: Abnormal   Collection Time: 03/28/24 10:31 PM   Specimen: Left Antecubital; Blood  Result Value Ref Range Status   Specimen  Description   Final    LEFT ANTECUBITAL Performed at [facility], [address], [place], [place] [address]    Special Requests   Final    BOTTLES DRAWN AEROBIC ONLY Blood Culture results may not be optimal due to an inadequate volume of blood received in culture bottles Performed at [facility], [address] [place], [place] [address]    Culture  Setup Time   Final    GRAM NEGATIVE RODS AEROBIC BOTTLE ONLY Gram Stain Report Called to,Read Back By and Verified With: G ROOS AT 1424 ON [id number] BY S DALTON CRITICAL RESULT CALLED TO, READ BACK BY AND VERIFIED WITH: B SUPER RN 03/29/2024 @ 2038 BY AB Performed at [facility], [address], [place], [place] [address]    Culture KLEBSIELLA PNEUMONIAE (A)  Final   Report Status 03/31/2024 FINAL  Final   Organism ID, Bacteria KLEBSIELLA PNEUMONIAE  Final      Susceptibility   Klebsiella pneumoniae - MIC*    AMPICILLIN >=32 RESISTANT Resistant     CEFAZOLIN (NON-URINE) 2 SENSITIVE Sensitive     CEFEPIME  <=0.12 SENSITIVE Sensitive     ERTAPENEM <=0.12 SENSITIVE Sensitive     CEFTRIAXONE  <=0.25 SENSITIVE Sensitive     CIPROFLOXACIN <=0.06 SENSITIVE Sensitive     GENTAMICIN <=1 SENSITIVE Sensitive     MEROPENEM <=0.25 SENSITIVE Sensitive     TRIMETH/SULFA <=20 SENSITIVE Sensitive     AMPICILLIN/SULBACTAM 4 SENSITIVE Sensitive     PIP/TAZO Value in next row Sensitive      <=4 SENSITIVEThis is a modified FDA-approved test that has been validated and its performance characteristics determined by the reporting laboratory.  This laboratory is certified under the Clinical Laboratory Improvement Amendments CLIA as qualified to perform high complexity clinical laboratory testing.    * KLEBSIELLA PNEUMONIAE  Blood Culture ID Panel (Reflexed)     Status: Abnormal   Collection Time: 03/28/24 10:31 PM  Result Value Ref Range Status   Enterococcus faecalis NOT DETECTED NOT DETECTED Final   Enterococcus Faecium NOT DETECTED NOT DETECTED  Final   Listeria monocytogenes NOT DETECTED NOT DETECTED Final   Staphylococcus species NOT DETECTED NOT DETECTED Final   Staphylococcus aureus (BCID) NOT DETECTED NOT DETECTED Final   Staphylococcus epidermidis NOT DETECTED NOT DETECTED Final   Staphylococcus lugdunensis  NOT DETECTED NOT DETECTED Final   Streptococcus species NOT DETECTED NOT DETECTED Final   Streptococcus agalactiae NOT DETECTED NOT DETECTED Final   Streptococcus pneumoniae NOT DETECTED NOT DETECTED Final   Streptococcus pyogenes NOT DETECTED NOT DETECTED Final   A.calcoaceticus-baumannii NOT DETECTED NOT DETECTED Final   Bacteroides fragilis NOT DETECTED NOT DETECTED Final   Enterobacterales DETECTED (A) NOT DETECTED Final    Comment: Enterobacterales represent a large order of gram negative bacteria, not a single organism. CRITICAL RESULT CALLED TO, READ BACK BY AND VERIFIED WITH: B SUPER RN 03/29/2024 @ 2038 BY AB    Enterobacter cloacae complex NOT DETECTED NOT DETECTED Final   Escherichia coli NOT DETECTED NOT DETECTED Final   Klebsiella aerogenes NOT DETECTED NOT DETECTED Final   Klebsiella oxytoca NOT DETECTED NOT DETECTED Final   Klebsiella pneumoniae DETECTED (A) NOT DETECTED Final    Comment: CRITICAL RESULT CALLED TO, READ BACK BY AND VERIFIED WITH: B SUPER RN 03/29/2024 @ 2038 BY AB    Proteus species NOT DETECTED NOT DETECTED Final   Salmonella species NOT DETECTED NOT DETECTED Final   Serratia marcescens NOT DETECTED NOT DETECTED Final   Haemophilus influenzae NOT DETECTED NOT DETECTED Final   Neisseria meningitidis NOT DETECTED NOT DETECTED Final   Pseudomonas aeruginosa NOT DETECTED NOT DETECTED Final   Stenotrophomonas maltophilia NOT DETECTED NOT DETECTED Final   Candida albicans NOT DETECTED NOT DETECTED Final   Candida auris NOT DETECTED NOT DETECTED Final   Candida glabrata NOT DETECTED NOT DETECTED Final   Candida krusei NOT DETECTED NOT DETECTED Final   Candida parapsilosis NOT DETECTED  NOT DETECTED Final   Candida tropicalis NOT DETECTED NOT DETECTED Final   Cryptococcus neoformans/gattii NOT DETECTED NOT DETECTED Final   CTX-M ESBL NOT DETECTED NOT DETECTED Final   Carbapenem resistance IMP NOT DETECTED NOT DETECTED Final   Carbapenem resistance KPC NOT DETECTED NOT DETECTED Final   Carbapenem resistance NDM NOT DETECTED NOT DETECTED Final   Carbapenem resist OXA 48 LIKE NOT DETECTED NOT DETECTED Final   Carbapenem resistance VIM NOT DETECTED NOT DETECTED Final    Comment: Performed at [facility], [address] [place], [place] [address]  MRSA Next Gen by PCR, Nasal     Status: None   Collection Time: 03/29/24  7:40 AM   Specimen: Nasal Mucosa; Nasal Swab  Result Value Ref Range Status   MRSA by PCR Next Gen NOT DETECTED NOT DETECTED Final    Comment: (NOTE) The GeneXpert MRSA Assay (FDA approved for NASAL specimens only), is one component of a comprehensive MRSA colonization surveillance program. It is not intended to diagnose MRSA infection nor to guide or monitor treatment for MRSA infections. Test performance is not FDA approved in patients less than 53 years old. Performed at [facility], [address] [place], [place] [address]     Time coordinating discharge:  40 mins   SIGNED:  Afton Louder, MD  [facility] 04/01/2024, 12:19 PM How to contact the [facility] Attending or Consulting provider 7A - 7P or covering provider during after hours 7P -7A, for this patient?  Check the care team in [facility] and look for a) attending/consulting [facility] provider listed and b) the TR[facility] team listed Log into [email or web address] and use [facility]'s universal password to access. If you do not have the password, please contact the hospital operator. Locate the [facility] provider you are looking for under [facility] and page to a number that you can be directly reached.  If you still have difficulty reaching the provider, please page the [facility] (Director on Call) for the  Hospitalists listed on amion for assistance.     [1] No Known Allergies

## 2024-04-01 NOTE — Progress Notes (Signed)
 Mobility Specialist Progress Note:    04/01/24 1130  Mobility  Activity Ambulated with assistance  Level of Assistance Moderate assist, patient does 50-74%  Assistive Device Front wheel walker  Distance Ambulated (ft) 35 ft  Range of Motion/Exercises Active;All extremities  Activity Response Tolerated well  Mobility Referral Yes  Mobility visit 1 Mobility  Mobility Specialist Start Time (ACUTE ONLY) 1130  Mobility Specialist Stop Time (ACUTE ONLY) 1150  Mobility Specialist Time Calculation (min) (ACUTE ONLY) 20 min   Pt received in chair, RN requesting pt ambulate. Required ModA to stand and MinA to ambulate with RW. Tolerated well, slightly confused and required verbal cues during ambulation. Returned to chair, alarm on and call bell in reach. RN and NT in room, all needs met.  Natoria Archibald Mobility Specialist Please contact via Special Educational Needs Teacher or  Rehab office at [phone]

## 2024-04-01 NOTE — Progress Notes (Signed)
 Pt able to sit up on side of bed on his own, assisted to standing position and used FWW to ambulate 25 feet and then back to recliner. Pt tolerated well. Oral care completed, pt fed breakfast, ate 50% and took of thickened fluids to drink. Tolerated well. Pt remains in recliner, chair alarm on for safety, call bell within reach.

## 2024-05-08 ENCOUNTER — Encounter: Payer: Self-pay | Admitting: Pharmacist

## 2024-05-08 NOTE — Progress Notes (Signed)
" ° °  05/08/2024 Name: [name] MRN: [id number] DOB: 12/03/1940  Chief Complaint  Patient presents with   Medication Adherence    Hydrochlorothiazide      Pharmacy Quality Measure Review  This patient is appearing on a report for being at risk of failing the adherence measure for hypertension (ACEi/ARB) medications this calendar year.   Medication: Losartan /HCTZ    Per chart review, losartan /HCTZ was discontinued due to hypotension. [name], PharmD, BCACP Clinical Pharmacist [phone]   "
